# Patient Record
Sex: Female | Born: 1965
Health system: Southern US, Community
[De-identification: ages and names within clinical notes are randomized; demographics above are authoritative.]

## PROBLEM LIST (undated history)

## (undated) DIAGNOSIS — I2699 Other pulmonary embolism without acute cor pulmonale: Secondary | ICD-10-CM

## (undated) DIAGNOSIS — E119 Type 2 diabetes mellitus without complications: Secondary | ICD-10-CM

## (undated) DIAGNOSIS — Z973 Presence of spectacles and contact lenses: Secondary | ICD-10-CM

## (undated) DIAGNOSIS — M1990 Unspecified osteoarthritis, unspecified site: Secondary | ICD-10-CM

## (undated) DIAGNOSIS — L659 Nonscarring hair loss, unspecified: Secondary | ICD-10-CM

## (undated) DIAGNOSIS — I10 Essential (primary) hypertension: Secondary | ICD-10-CM

## (undated) DIAGNOSIS — E538 Deficiency of other specified B group vitamins: Secondary | ICD-10-CM

## (undated) DIAGNOSIS — J069 Acute upper respiratory infection, unspecified: Secondary | ICD-10-CM

## (undated) DIAGNOSIS — D509 Iron deficiency anemia, unspecified: Secondary | ICD-10-CM

## (undated) HISTORY — DX: Type 2 diabetes mellitus without complications: E11.9

## (undated) HISTORY — DX: Essential (primary) hypertension: I10

## (undated) HISTORY — DX: Nonscarring hair loss, unspecified: L65.9

## (undated) HISTORY — DX: Deficiency of other specified B group vitamins: E53.8

## (undated) HISTORY — DX: Other pulmonary embolism without acute cor pulmonale: I26.99

## (undated) HISTORY — DX: Iron deficiency anemia, unspecified: D50.9

---

## 2002-12-10 ENCOUNTER — Other Ambulatory Visit: Admission: RE | Admit: 2002-12-10 | Discharge: 2002-12-10 | Payer: Self-pay | Admitting: *Deleted

## 2008-10-24 ENCOUNTER — Emergency Department (HOSPITAL_COMMUNITY): Admission: EM | Admit: 2008-10-24 | Discharge: 2008-10-25 | Payer: Self-pay | Admitting: Emergency Medicine

## 2009-01-14 ENCOUNTER — Encounter: Admission: RE | Admit: 2009-01-14 | Discharge: 2009-01-14 | Payer: Self-pay | Admitting: Internal Medicine

## 2010-02-12 ENCOUNTER — Encounter
Admission: RE | Admit: 2010-02-12 | Discharge: 2010-02-12 | Payer: Self-pay | Source: Home / Self Care | Attending: Internal Medicine | Admitting: Internal Medicine

## 2011-03-09 ENCOUNTER — Other Ambulatory Visit: Payer: Self-pay | Admitting: Internal Medicine

## 2011-03-09 DIAGNOSIS — Z1231 Encounter for screening mammogram for malignant neoplasm of breast: Secondary | ICD-10-CM

## 2011-04-06 ENCOUNTER — Ambulatory Visit
Admission: RE | Admit: 2011-04-06 | Discharge: 2011-04-06 | Disposition: A | Discharge status: Home / Self Care | Payer: 59 | Source: Ambulatory Visit | Attending: Internal Medicine | Admitting: Internal Medicine

## 2011-04-06 DIAGNOSIS — Z1231 Encounter for screening mammogram for malignant neoplasm of breast: Secondary | ICD-10-CM

## 2012-08-09 ENCOUNTER — Other Ambulatory Visit: Payer: Self-pay

## 2012-08-09 DIAGNOSIS — Z1231 Encounter for screening mammogram for malignant neoplasm of breast: Secondary | ICD-10-CM

## 2012-08-27 ENCOUNTER — Ambulatory Visit
Admission: RE | Admit: 2012-08-27 | Discharge: 2012-08-27 | Disposition: A | Discharge status: Home / Self Care | Payer: 59 | Source: Ambulatory Visit

## 2012-08-27 DIAGNOSIS — Z1231 Encounter for screening mammogram for malignant neoplasm of breast: Secondary | ICD-10-CM

## 2013-02-14 HISTORY — PX: BREAST BIOPSY: SHX20

## 2013-11-29 ENCOUNTER — Other Ambulatory Visit: Payer: Self-pay

## 2013-11-29 DIAGNOSIS — Z1239 Encounter for other screening for malignant neoplasm of breast: Secondary | ICD-10-CM

## 2013-12-05 ENCOUNTER — Other Ambulatory Visit: Payer: Self-pay

## 2013-12-05 DIAGNOSIS — Z1231 Encounter for screening mammogram for malignant neoplasm of breast: Secondary | ICD-10-CM

## 2013-12-11 ENCOUNTER — Ambulatory Visit
Admission: RE | Admit: 2013-12-11 | Discharge: 2013-12-11 | Disposition: A | Discharge status: Home / Self Care | Payer: 59 | Source: Ambulatory Visit

## 2013-12-11 DIAGNOSIS — Z1231 Encounter for screening mammogram for malignant neoplasm of breast: Secondary | ICD-10-CM

## 2013-12-13 ENCOUNTER — Other Ambulatory Visit: Payer: Self-pay | Admitting: Internal Medicine

## 2013-12-13 DIAGNOSIS — R928 Other abnormal and inconclusive findings on diagnostic imaging of breast: Secondary | ICD-10-CM

## 2013-12-23 ENCOUNTER — Ambulatory Visit
Admission: RE | Admit: 2013-12-23 | Discharge: 2013-12-23 | Disposition: A | Discharge status: Home / Self Care | Payer: 59 | Source: Ambulatory Visit | Attending: Internal Medicine | Admitting: Internal Medicine

## 2013-12-23 ENCOUNTER — Other Ambulatory Visit: Payer: Self-pay | Admitting: Internal Medicine

## 2013-12-23 DIAGNOSIS — R928 Other abnormal and inconclusive findings on diagnostic imaging of breast: Secondary | ICD-10-CM

## 2013-12-30 ENCOUNTER — Other Ambulatory Visit: Payer: Self-pay | Admitting: Internal Medicine

## 2013-12-30 DIAGNOSIS — R928 Other abnormal and inconclusive findings on diagnostic imaging of breast: Secondary | ICD-10-CM

## 2013-12-31 ENCOUNTER — Ambulatory Visit
Admission: RE | Admit: 2013-12-31 | Discharge: 2013-12-31 | Disposition: A | Discharge status: Home / Self Care | Payer: 59 | Source: Ambulatory Visit | Attending: Internal Medicine | Admitting: Internal Medicine

## 2013-12-31 DIAGNOSIS — R928 Other abnormal and inconclusive findings on diagnostic imaging of breast: Secondary | ICD-10-CM

## 2015-01-23 ENCOUNTER — Other Ambulatory Visit: Payer: Self-pay

## 2015-01-23 DIAGNOSIS — Z1231 Encounter for screening mammogram for malignant neoplasm of breast: Secondary | ICD-10-CM

## 2015-02-12 ENCOUNTER — Ambulatory Visit
Admission: RE | Admit: 2015-02-12 | Discharge: 2015-02-12 | Disposition: A | Discharge status: Home / Self Care | Payer: 59 | Source: Ambulatory Visit

## 2015-02-12 DIAGNOSIS — Z1231 Encounter for screening mammogram for malignant neoplasm of breast: Secondary | ICD-10-CM

## 2016-01-28 ENCOUNTER — Encounter: Payer: Self-pay | Admitting: Podiatry

## 2016-01-28 ENCOUNTER — Ambulatory Visit (INDEPENDENT_AMBULATORY_CARE_PROVIDER_SITE_OTHER): Payer: 59 | Admitting: Podiatry

## 2016-01-28 ENCOUNTER — Ambulatory Visit (INDEPENDENT_AMBULATORY_CARE_PROVIDER_SITE_OTHER): Payer: 59

## 2016-01-28 VITALS — HR 100 | Resp 16 | Ht 65.0 in | Wt 330.0 lb

## 2016-01-28 DIAGNOSIS — M722 Plantar fascial fibromatosis: Secondary | ICD-10-CM

## 2016-01-28 DIAGNOSIS — M79672 Pain in left foot: Secondary | ICD-10-CM

## 2016-01-28 DIAGNOSIS — M79671 Pain in right foot: Secondary | ICD-10-CM | POA: Diagnosis not present

## 2016-01-28 MED ORDER — TRIAMCINOLONE ACETONIDE 10 MG/ML IJ SUSP
10.0000 mg | Freq: Once | INTRAMUSCULAR | Status: AC
  Administered 2016-01-28: 10 mg

## 2016-01-28 NOTE — Progress Notes (Signed)
Subjective:     Patient ID: Kristen [name], female   DOB: [birth year], 50 y.o.   MRN: [id number]  HPI patient states I've had a lot of pain in the bottom of both my heels for several months and I do not remember specific injury. States it's worse when she gets up in the morning and after periods of sitting   Review of Systems  All other systems reviewed and are negative.      Objective:   Physical Exam  Constitutional: She is oriented to person, place, and time.  Cardiovascular: Intact distal pulses.   Musculoskeletal: Normal range of motion.  Neurological: She is oriented to person, place, and time.  Skin: Skin is warm.  Nursing note and vitals reviewed.  neurovascular status intact muscle strength adequate range of motion within normal limits with patient found to have exquisite discomfort plantar heel region right over left with fluid buildup around the medial band. I noted there to be diminished arch height bilateral with obesity as, Katie factor     Assessment:     Acute plantar fasciitis right over left    Plan:     H&P x-rays reviewed and injected the plantar fascial bilateral 3 mg Kenalog 5 mill grams Xylocaine and applied fascial brace bilateral gave instructions on physical therapy. Reappoint to recheck in the next several weeks and told her to watch her sugar more carefully over the next several days  X-ray report indicates significant depression of the arch with spur formation and no indication of stress fracture arthritis

## 2016-01-28 NOTE — Progress Notes (Signed)
   Subjective:    Patient ID: Kristen [name], female    DOB: October 30, 1965, 50 y.o.   MRN: [id number]  HPI Chief Complaint  Patient presents with  . Foot Pain    Bilateral; plantar; pt stated, "Has bursitis; has more pain on the right foot; hurts to walk on feet"; x1 month   Pt Diabetes Type 2; Sugar=did not check today; A1C=6.4   Review of Systems  All other systems reviewed and are negative.      Objective:   Physical Exam        Assessment & Plan:

## 2016-01-28 NOTE — Patient Instructions (Signed)

## 2016-01-29 ENCOUNTER — Ambulatory Visit: Payer: Self-pay | Admitting: Podiatry

## 2016-02-02 ENCOUNTER — Encounter (HOSPITAL_COMMUNITY): Payer: Self-pay | Admitting: Family Medicine

## 2016-02-02 ENCOUNTER — Ambulatory Visit (HOSPITAL_COMMUNITY)
Admission: EM | Admit: 2016-02-02 | Discharge: 2016-02-02 | Disposition: A | Discharge status: Home / Self Care | Payer: 59 | Attending: Family Medicine | Admitting: Family Medicine

## 2016-02-02 DIAGNOSIS — R05 Cough: Secondary | ICD-10-CM | POA: Diagnosis not present

## 2016-02-02 DIAGNOSIS — R062 Wheezing: Secondary | ICD-10-CM

## 2016-02-02 DIAGNOSIS — J208 Acute bronchitis due to other specified organisms: Secondary | ICD-10-CM | POA: Diagnosis not present

## 2016-02-02 DIAGNOSIS — R059 Cough, unspecified: Secondary | ICD-10-CM

## 2016-02-02 MED ORDER — ALBUTEROL SULFATE HFA 108 (90 BASE) MCG/ACT IN AERS: 2.0000 | INHALATION_SPRAY | RESPIRATORY_TRACT | 0 refills | Status: DC | PRN

## 2016-02-02 MED ORDER — AZITHROMYCIN 250 MG PO TABS: 250.0000 mg | ORAL_TABLET | Freq: Every day | ORAL | 0 refills | Status: DC

## 2016-02-02 MED ORDER — METHYLPREDNISOLONE 4 MG PO TBPK: ORAL_TABLET | ORAL | 0 refills | Status: DC

## 2016-02-02 MED ORDER — BENZONATATE 100 MG PO CAPS: 200.0000 mg | ORAL_CAPSULE | Freq: Three times a day (TID) | ORAL | 0 refills | Status: DC | PRN

## 2016-02-02 NOTE — ED Triage Notes (Signed)
Pt here for cough, worse at night and throat irritation. Pt hoarse. sts she has been using OTC meds without relief.

## 2016-02-02 NOTE — ED Provider Notes (Signed)
CSN: [id number]     Arrival date & time 02/02/16  1901 History   First MD Initiated Contact with Patient 02/02/16 1956     Chief Complaint  Patient presents with  . Cough   (Consider location/radiation/quality/duration/timing/severity/associated sxs/prior Treatment) 50 y/o female c/o wheezing and cough which is worse at night.    Cough  Cough characteristics:  Productive Sputum characteristics:  Yellow Severity:  Moderate Onset quality:  Sudden Duration:  1 week Timing:  Constant Progression:  Worsening Chronicity:  New Smoker: no   Context: upper respiratory infection and weather changes   Relieved by:  Nothing Worsened by:  Deep breathing and activity Ineffective treatments:  None tried Associated symptoms: rhinorrhea, shortness of breath, sinus congestion and wheezing     Past Medical History:  Diagnosis Date  . Diabetes ([place])    Type 2  . Hypertension    History reviewed. No pertinent surgical history. History reviewed. No pertinent family history. Social History  Substance Use Topics  . Smoking status: Never Smoker  . Smokeless tobacco: Never Used  . Alcohol use Not on file   OB History    No data available     Review of Systems  Constitutional: Positive for fatigue.  HENT: Positive for rhinorrhea.   Eyes: Negative.   Respiratory: Positive for cough, shortness of breath and wheezing.   Cardiovascular: Negative.   Gastrointestinal: Negative.   Endocrine: Negative.   Genitourinary: Negative.   Musculoskeletal: Negative.   Neurological: Negative.   Hematological: Negative.   Psychiatric/Behavioral: Negative.     Allergies  Amoxicillin  Home Medications   Prior to Admission medications   Medication Sig Start Date End Date Taking? Authorizing Provider  albuterol (PROVENTIL HFA;VENTOLIN HFA) 108 (90 Base) MCG/ACT inhaler Inhale 2 puffs into the lungs every 4 (four) hours as needed for wheezing or shortness of breath. 02/02/16   Lysbeth Penner,  FNP  aspirin EC 81 MG tablet Take 81 mg by mouth daily.    Historical Provider, MD  azithromycin (ZITHROMAX) 250 MG tablet Take 1 tablet (250 mg total) by mouth daily. Take first 2 tablets together, then 1 every day until finished. 02/02/16   Lysbeth Penner, FNP  benzonatate (TESSALON) 100 MG capsule Take 2 capsules (200 mg total) by mouth 3 (three) times daily as needed for cough. 02/02/16   Lysbeth Penner, FNP  cetirizine (ZYRTEC) 10 MG tablet Take 10 mg by mouth daily.    Historical Provider, MD  liraglutide (VICTOZA) 18 MG/3ML SOPN Inject into the skin.    Historical Provider, MD  metFORMIN (GLUCOPHAGE) 500 MG tablet Take 500 mg by mouth 2 (two) times daily with a meal.    Historical Provider, MD  methylPREDNISolone (MEDROL DOSEPAK) 4 MG TBPK tablet Take 6-5-4-3-2-1 po qd 02/02/16   Lysbeth Penner, FNP  Misc Natural Products (OSTEO BI-FLEX TRIPLE STRENGTH PO) Take 2 tablets by mouth daily.    Historical Provider, MD  Multiple Vitamins-Minerals (MULTIVITAMIN ADULT PO) Take 1 tablet by mouth daily.    Historical Provider, MD  olmesartan-hydrochlorothiazide (BENICAR HCT) 20-12.5 MG tablet Take 1 tablet by mouth daily.    Historical Provider, MD  vitamin B-12 (CYANOCOBALAMIN) 100 MCG tablet Take 100 mcg by mouth daily. Pt takes 2 tablets every day    Historical Provider, MD   Meds Ordered and Administered this Visit  Medications - No data to display  BP 105/76   Pulse 93   Temp 98.2 F (36.8 C)   Resp 18  SpO2 93%  No data found.   Physical Exam  Constitutional: She appears well-developed and well-nourished.  HENT:  Head: Normocephalic.  Right Ear: External ear normal.  Left Ear: External ear normal.  Mouth/Throat: Oropharynx is clear and moist.  Eyes: Conjunctivae and EOM are normal. Pupils are equal, round, and reactive to light.  Neck: Normal range of motion. Neck supple.  Cardiovascular: Normal rate, regular rhythm and normal heart sounds.   Pulmonary/Chest: Effort  normal. She has wheezes.  Abdominal: Soft. Bowel sounds are normal.  Musculoskeletal: Normal range of motion.  Nursing note and vitals reviewed.   Urgent Care Course   Clinical Course     Procedures (including critical care time)  Labs Review Labs Reviewed - No data to display  Imaging Review No results found.   Visual Acuity Review  Right Eye Distance:   Left Eye Distance:   Bilateral Distance:    Right Eye Near:   Left Eye Near:    Bilateral Near:         MDM   1. Acute bronchitis due to other specified organisms   2. Cough   3. Wheezing    Medrol dose pack as directed #21 4mg  Zithromax Pack as directed Albuterol MDI 2 puffs qid prn #1 inhaler Tessalon Perles 100mg  2 po tid prn #30  Push po fluids, rest, tylenol and motrin otc prn as directed for fever, arthralgias, and myalgias.  Follow up prn if sx's continue or persist.   Lysbeth Penner, FNP 02/02/16 2014

## 2016-02-10 ENCOUNTER — Ambulatory Visit (INDEPENDENT_AMBULATORY_CARE_PROVIDER_SITE_OTHER): Payer: 59 | Admitting: Podiatry

## 2016-02-10 ENCOUNTER — Encounter: Payer: Self-pay | Admitting: Podiatry

## 2016-02-10 ENCOUNTER — Other Ambulatory Visit: Payer: Self-pay | Admitting: Internal Medicine

## 2016-02-10 DIAGNOSIS — M722 Plantar fascial fibromatosis: Secondary | ICD-10-CM

## 2016-02-10 DIAGNOSIS — Z1231 Encounter for screening mammogram for malignant neoplasm of breast: Secondary | ICD-10-CM

## 2016-02-10 NOTE — Progress Notes (Signed)
Subjective:     Patient ID: Kristen [name], female   DOB: [birth year], 50 y.o.   MRN: [id number]  HPI patient states that she still having pain but it's improved from previous visit with inflammation still noted upon   Review of Systems     Objective:   Physical Exam Neurovascular status intact muscle strength was adequate patient found to have continued discomfort plantar heel region bilateral with improvement from previous visit but still present upon deep    Assessment:     Plantar fasciitis still present but improved    Plan:     Advised patient on long-term orthotics and discussed and went ahead today and scanned for customized orthotic devices. I then discussed physical therapy and reviewed all different components of this condition

## 2016-02-16 ENCOUNTER — Ambulatory Visit
Admission: RE | Admit: 2016-02-16 | Discharge: 2016-02-16 | Disposition: A | Discharge status: Home / Self Care | Payer: 59 | Source: Ambulatory Visit | Attending: Internal Medicine | Admitting: Internal Medicine

## 2016-02-16 DIAGNOSIS — Z1231 Encounter for screening mammogram for malignant neoplasm of breast: Secondary | ICD-10-CM

## 2016-02-24 ENCOUNTER — Other Ambulatory Visit: Payer: Self-pay | Admitting: Gastroenterology

## 2016-03-17 NOTE — H&P (Signed)
  Kristen [name] HPI: At this time the patient denies any problems with nausea, vomiting, fevers, chills, abdominal pain, diarrhea, constipation, hematochezia, melena, GERD, or dysphagia. The patient denies any known family history of colon cancers. No complaints of chest pain, SOB, MI, or sleep apnea.  Past Medical History:  Diagnosis Date  . Diabetes ([place])    Type 2  . Hypertension     No past surgical history on file.  No family history on file.  Social History:  reports that she has never smoked. She has never used smokeless tobacco. Her alcohol and drug histories are not on file.  Allergies:  Allergies  Allergen Reactions  . Amoxicillin Hives    Has patient had a PCN reaction causing immediate rash, facial/tongue/throat swelling, SOB or lightheadedness with hypotension: No Has patient had a PCN reaction causing severe rash involving mucus membranes or skin necrosis: Yes Has patient had a PCN reaction that required hospitalization No Has patient had a PCN reaction occurring within the last 10 years: No If all of the above answers are "NO", then may proceed with Cephalosporin use.   . Fish Allergy Hives    Medications: Scheduled: Continuous:  No results found for this or any previous visit (from the past 24 hour(s)).   No results found.  ROS:  As stated above in the HPI otherwise negative.  There were no vitals taken for this visit.    PE: Gen: NAD, Alert and Oriented HEENT:  [place]/AT, EOMI Neck: Supple, no LAD Lungs: CTA Bilaterally CV: RRR without M/G/R ABM: Soft, NTND, +BS Ext: No C/C/E  Assessment/Plan: 1) Screening colonoscopy.  Dustin Bumbaugh D 03/17/2016, 11:30 AM

## 2016-03-18 ENCOUNTER — Encounter (HOSPITAL_COMMUNITY): Payer: Self-pay

## 2016-03-18 ENCOUNTER — Ambulatory Visit (HOSPITAL_COMMUNITY): Payer: BLUE CROSS/BLUE SHIELD | Admitting: Anesthesiology

## 2016-03-18 ENCOUNTER — Ambulatory Visit (HOSPITAL_COMMUNITY)
Admission: RE | Admit: 2016-03-18 | Discharge: 2016-03-18 | Disposition: A | Discharge status: Home / Self Care | Payer: BLUE CROSS/BLUE SHIELD | Source: Ambulatory Visit | Attending: Gastroenterology | Admitting: Gastroenterology

## 2016-03-18 ENCOUNTER — Encounter (HOSPITAL_COMMUNITY)
Admission: RE | Disposition: A | Discharge status: Home / Self Care | Payer: Self-pay | Source: Ambulatory Visit | Attending: Gastroenterology

## 2016-03-18 DIAGNOSIS — E119 Type 2 diabetes mellitus without complications: Secondary | ICD-10-CM | POA: Diagnosis not present

## 2016-03-18 DIAGNOSIS — Z7984 Long term (current) use of oral hypoglycemic drugs: Secondary | ICD-10-CM | POA: Diagnosis not present

## 2016-03-18 DIAGNOSIS — K5730 Diverticulosis of large intestine without perforation or abscess without bleeding: Secondary | ICD-10-CM | POA: Insufficient documentation

## 2016-03-18 DIAGNOSIS — I10 Essential (primary) hypertension: Secondary | ICD-10-CM | POA: Diagnosis not present

## 2016-03-18 DIAGNOSIS — Z880 Allergy status to penicillin: Secondary | ICD-10-CM | POA: Insufficient documentation

## 2016-03-18 DIAGNOSIS — Z79899 Other long term (current) drug therapy: Secondary | ICD-10-CM | POA: Diagnosis not present

## 2016-03-18 DIAGNOSIS — Z91013 Allergy to seafood: Secondary | ICD-10-CM | POA: Insufficient documentation

## 2016-03-18 DIAGNOSIS — Z1211 Encounter for screening for malignant neoplasm of colon: Secondary | ICD-10-CM | POA: Diagnosis present

## 2016-03-18 HISTORY — PX: COLONOSCOPY WITH PROPOFOL: SHX5780

## 2016-03-18 LAB — GLUCOSE, CAPILLARY: Glucose-Capillary: 135 mg/dL — ABNORMAL HIGH (ref 65–99)

## 2016-03-18 SURGERY — COLONOSCOPY WITH PROPOFOL
Anesthesia: Monitor Anesthesia Care

## 2016-03-18 MED ORDER — OXYCODONE HCL 5 MG PO TABS: 5.0000 mg | ORAL_TABLET | Freq: Once | ORAL | Status: DC | PRN

## 2016-03-18 MED ORDER — OXYCODONE HCL 5 MG/5ML PO SOLN: 5.0000 mg | Freq: Once | ORAL | Status: DC | PRN

## 2016-03-18 MED ORDER — ONDANSETRON HCL 4 MG/2ML IJ SOLN: 4.0000 mg | Freq: Once | INTRAMUSCULAR | Status: DC | PRN

## 2016-03-18 MED ORDER — PROPOFOL 10 MG/ML IV BOLUS
INTRAVENOUS | Status: AC
  Filled 2016-03-18: qty 40

## 2016-03-18 MED ORDER — LACTATED RINGERS IV SOLN
INTRAVENOUS | Status: DC
  Administered 2016-03-18: 07:00:00 via INTRAVENOUS

## 2016-03-18 MED ORDER — FENTANYL CITRATE (PF) 100 MCG/2ML IJ SOLN: 25.0000 ug | INTRAMUSCULAR | Status: DC | PRN

## 2016-03-18 MED ORDER — PROPOFOL 10 MG/ML IV BOLUS
INTRAVENOUS | Status: DC | PRN
  Administered 2016-03-18 (×6): 20 mg via INTRAVENOUS
  Administered 2016-03-18: 30 mg via INTRAVENOUS
  Administered 2016-03-18 (×2): 20 mg via INTRAVENOUS
  Administered 2016-03-18: 30 mg via INTRAVENOUS
  Administered 2016-03-18 (×2): 10 mg via INTRAVENOUS
  Administered 2016-03-18: 20 mg via INTRAVENOUS
  Administered 2016-03-18: 30 mg via INTRAVENOUS
  Administered 2016-03-18 (×2): 20 mg via INTRAVENOUS
  Administered 2016-03-18: 30 mg via INTRAVENOUS

## 2016-03-18 MED ORDER — SODIUM CHLORIDE 0.9 % IV SOLN: INTRAVENOUS | Status: DC

## 2016-03-18 SURGICAL SUPPLY — 21 items

## 2016-03-18 NOTE — Anesthesia Postprocedure Evaluation (Signed)
Anesthesia Post Note  Patient: Kristen [name]  Procedure(s) Performed: Procedure(s) (LRB): COLONOSCOPY WITH PROPOFOL (N/A)  Patient location during evaluation: Endoscopy Anesthesia Type: MAC Level of consciousness: awake, awake and alert and oriented Pain management: pain level controlled Vital Signs Assessment: post-procedure vital signs reviewed and stable Respiratory status: spontaneous breathing, nonlabored ventilation, respiratory function stable and patient connected to nasal cannula oxygen Cardiovascular status: blood pressure returned to baseline Anesthetic complications: no       Last Vitals:  Vitals:   03/18/16 0850 03/18/16 0900  BP: (!) 187/137 (!) 190/115  Pulse: 94 90  Resp: 19 15  Temp:      Last Pain:  Vitals:   03/18/16 0835  TempSrc: Oral                 Shaili Donalson COKER

## 2016-03-18 NOTE — Anesthesia Preprocedure Evaluation (Signed)
Anesthesia Evaluation  Patient identified by MRN, date of birth, ID band Patient awake    Reviewed: Allergy & Precautions, NPO status , Patient's Chart, lab work & pertinent test results  Airway Mallampati: II  TM Distance: >3 FB Neck ROM: Full    Dental  (+) Teeth Intact, Dental Advisory Given   Pulmonary    breath sounds clear to auscultation       Cardiovascular hypertension,  Rhythm:Regular Rate:Normal     Neuro/Psych    GI/Hepatic   Endo/Other  diabetes  Renal/GU      Musculoskeletal   Abdominal   Peds  Hematology   Anesthesia Other Findings   Reproductive/Obstetrics                             Anesthesia Physical Anesthesia Plan  ASA: III  Anesthesia Plan: MAC   Post-op Pain Management:    Induction: Intravenous  Airway Management Planned: Natural Airway and Nasal Cannula  Additional Equipment:   Intra-op Plan:   Post-operative Plan:   Informed Consent: I have reviewed the patients History and Physical, chart, labs and discussed the procedure including the risks, benefits and alternatives for the proposed anesthesia with the patient or authorized representative who has indicated his/her understanding and acceptance.   Dental advisory given  Plan Discussed with: CRNA and Anesthesiologist  Anesthesia Plan Comments:         Anesthesia Quick Evaluation

## 2016-03-18 NOTE — Discharge Instructions (Signed)

## 2016-03-18 NOTE — Op Note (Signed)
[facility] Patient Name: Kristen [name] Procedure Date: 03/18/2016 MRN: [id number] Attending MD: Carol Ada , MD Date of Birth: 11-Aug-1965 CSN: [id number] Age: 51 Admit Type: Outpatient Procedure:                Colonoscopy Indications:              Screening for colorectal malignant neoplasm Providers:                Carol Ada, MD, Hilma Favors, RN, Alfonso Patten,                            Technician, [place] Alday CRNA, CRNA Referring MD:              Medicines:                Propofol per Anesthesia Complications:            No immediate complications. Estimated Blood Loss:     Estimated blood loss: none. Procedure:                Pre-Anesthesia Assessment:                           - Prior to the procedure, a History and Physical                            was performed, and patient medications and                            allergies were reviewed. The patient's tolerance of                            previous anesthesia was also reviewed. The risks                            and benefits of the procedure and the sedation                            options and risks were discussed with the patient.                            All questions were answered, and informed consent                            was obtained. Prior Anticoagulants: The patient has                            taken no previous anticoagulant or antiplatelet                            agents. ASA Grade Assessment: III - A patient with                            severe systemic disease. After reviewing the risks  and benefits, the patient was deemed in                            satisfactory condition to undergo the procedure.                           - Sedation was administered by an anesthesia                            professional. Deep sedation was attained.                           After obtaining informed consent, the colonoscope      was passed under direct vision. Throughout the                            procedure, the patient's blood pressure, pulse, and                            oxygen saturations were monitored continuously. The                            EC-[id number]) scope was introduced through                            the anus and advanced to the the cecum, identified                            by appendiceal orifice and ileocecal valve. The                            colonoscopy was performed without difficulty. The                            patient tolerated the procedure well. The quality                            of the bowel preparation was excellent. The                            ileocecal valve, appendiceal orifice, and rectum                            were photographed. Scope In: 8:12:58 AM Scope Out: 8:31:12 AM Scope Withdrawal Time: 0 hours 14 minutes 59 seconds  Total Procedure Duration: 0 hours 18 minutes 14 seconds  Findings:      A few large-mouthed diverticula were found in the ascending colon. Impression:               - Diverticulosis in the ascending colon.                           - No specimens collected. Moderate Sedation:      None Recommendation:           - Patient has a  contact number available for                            emergencies. The signs and symptoms of potential                            delayed complications were discussed with the                            patient. Return to normal activities tomorrow.                            Written discharge instructions were provided to the                            patient.                           - Resume previous diet.                           - Continue present medications.                           - Repeat colonoscopy in 10 years for surveillance. Procedure Code(s):        --- Professional ---                           [id number], Colonoscopy, flexible; diagnostic, including                             collection of specimen(s) by brushing or washing,                            when performed (separate procedure) Diagnosis Code(s):        --- Professional ---                           Z12.11, Encounter for screening for malignant                            neoplasm of colon                           K57.30, Diverticulosis of large intestine without                            perforation or abscess without bleeding CPT copyright 2016 [employer] Association. All rights reserved. The codes documented in this report are preliminary and upon coder review may  be revised to meet current compliance requirements. Carol Ada, MD Carol Ada, MD 03/18/2016 8:39:01 AM This report has been signed electronically. Number of Addenda: 0

## 2016-03-18 NOTE — Transfer of Care (Signed)
Immediate Anesthesia Transfer of Care Note  Patient: Kristen [name]  Procedure(s) Performed: Procedure(s): COLONOSCOPY WITH PROPOFOL (N/A)  Patient Location: PACU  Anesthesia Type:MAC  Level of Consciousness: awake, alert  and oriented  Airway & Oxygen Therapy: Patient Spontanous Breathing and Patient connected to face mask oxygen  Post-op Assessment: Report given to RN and Post -op Vital signs reviewed and stable  Post vital signs: Reviewed and stable  Last Vitals:  Vitals:   03/18/16 0712  BP: (!) 195/116  Pulse: (!) 101  Resp: (!) 21  Temp: 36.6 C    Last Pain:  Vitals:   03/18/16 0712  TempSrc: Oral         Complications: No apparent anesthesia complications

## 2016-03-20 ENCOUNTER — Encounter (HOSPITAL_COMMUNITY): Payer: Self-pay | Admitting: Gastroenterology

## 2016-03-22 ENCOUNTER — Ambulatory Visit: Payer: BLUE CROSS/BLUE SHIELD

## 2016-03-22 DIAGNOSIS — M722 Plantar fascial fibromatosis: Secondary | ICD-10-CM

## 2016-03-22 NOTE — Patient Instructions (Signed)

## 2016-03-22 NOTE — Progress Notes (Signed)
Patient presents for orthotic pick up.  Verbal and written break in and wear instructions given.  Patient will follow up in 4 weeks if symptoms worsen or fail to improve. 

## 2016-07-02 ENCOUNTER — Encounter (HOSPITAL_COMMUNITY): Payer: Self-pay | Admitting: Emergency Medicine

## 2016-07-02 ENCOUNTER — Ambulatory Visit (HOSPITAL_COMMUNITY)
Admission: EM | Admit: 2016-07-02 | Discharge: 2016-07-02 | Disposition: A | Discharge status: Home / Self Care | Payer: BLUE CROSS/BLUE SHIELD | Attending: Internal Medicine | Admitting: Internal Medicine

## 2016-07-02 DIAGNOSIS — H66001 Acute suppurative otitis media without spontaneous rupture of ear drum, right ear: Secondary | ICD-10-CM | POA: Diagnosis not present

## 2016-07-02 MED ORDER — BENZONATATE 100 MG PO CAPS: 100.0000 mg | ORAL_CAPSULE | Freq: Three times a day (TID) | ORAL | 0 refills | Status: DC

## 2016-07-02 MED ORDER — CEFDINIR 300 MG PO CAPS: 300.0000 mg | ORAL_CAPSULE | Freq: Two times a day (BID) | ORAL | 0 refills | Status: DC

## 2016-07-02 NOTE — ED Provider Notes (Signed)
CSN: [id number]     Arrival date & time 07/02/16  1745 History   First MD Initiated Contact with Patient 07/02/16 1815     Chief Complaint  Patient presents with  . URI   (Consider location/radiation/quality/duration/timing/severity/associated sxs/prior Treatment) 51 year old female presents to clinic with an 8 day history of cough, congestion, initially had a runny nose, and since resolved, fever, chills, loss of appetite, 2 day history of hoarse voice, and a 24-hour history of right ear pain and pressure. She denies any nausea, vomiting, diarrhea, shortness of breath, wheezing, or other symptoms. She has taken over-the-counter naproxen with some relief, otherwise does not take any additional over-the-counter medicines.   The history is provided by the patient.  URI  Presenting symptoms: congestion, cough, ear pain, fever and sore throat   Presenting symptoms: no rhinorrhea   Associated symptoms: no sinus pain and no wheezing     Past Medical History:  Diagnosis Date  . Diabetes ([place])    Type 2  . Hypertension    Past Surgical History:  Procedure Laterality Date  . COLONOSCOPY WITH PROPOFOL N/A 03/18/2016   Procedure: COLONOSCOPY WITH PROPOFOL;  Surgeon: Carol Ada, MD;  Location: [facility];  Service: Endoscopy;  Laterality: N/A;   History reviewed. No pertinent family history. Social History  Substance Use Topics  . Smoking status: Never Smoker  . Smokeless tobacco: Never Used  . Alcohol use No   OB History    No data available     Review of Systems  Constitutional: Positive for appetite change, chills and fever.  HENT: Positive for congestion, ear pain, sore throat and voice change. Negative for rhinorrhea, sinus pain, sinus pressure and trouble swallowing.   Eyes: Negative.   Respiratory: Positive for cough. Negative for shortness of breath and wheezing.   Cardiovascular: Negative for chest pain and palpitations.  Gastrointestinal: Negative for abdominal pain,  diarrhea, nausea and vomiting.  Musculoskeletal: Negative.   Skin: Negative.   Neurological: Negative.     Allergies  Amoxicillin and Fish allergy  Home Medications   Prior to Admission medications   Medication Sig Start Date End Date Taking? Authorizing Provider  aspirin EC 81 MG tablet Take 81 mg by mouth 3 (three) times a week. Mon, Wed, and Fri   Yes [provider]  cetirizine (ZYRTEC) 10 MG tablet Take 10 mg by mouth daily.   Yes [provider]  FLUoxetine (PROZAC) 20 MG tablet Take 20 mg by mouth daily. 02/14/16  Yes [provider]  metFORMIN (GLUCOPHAGE) 500 MG tablet Take 1,000 mg by mouth 2 (two) times daily with a meal.    Yes [provider]  Multiple Vitamins-Minerals (MULTIVITAMIN ADULT PO) Take 1 tablet by mouth daily.   Yes [provider]  olmesartan-hydrochlorothiazide (BENICAR HCT) 20-12.5 MG tablet Take 1 tablet by mouth daily.   Yes [provider]  vitamin B-12 (CYANOCOBALAMIN) 100 MCG tablet Take 200 mcg by mouth daily.    Yes [provider]  benzonatate (TESSALON) 100 MG capsule Take 1 capsule (100 mg total) by mouth every 8 (eight) hours. 07/02/16   Barnet Glasgow, NP  cefdinir (OMNICEF) 300 MG capsule Take 1 capsule (300 mg total) by mouth 2 (two) times daily. 07/02/16   Barnet Glasgow, NP  ibuprofen (ADVIL,MOTRIN) 200 MG tablet Take 600 mg by mouth daily as needed for moderate pain.    [provider]  liraglutide (VICTOZA) 18 MG/3ML SOPN Inject 1.8 mg into the skin every evening.  [provider]  Misc Natural Products (OSTEO BI-FLEX TRIPLE STRENGTH PO) Take 1 tablet by mouth daily.     [provider]   Meds Ordered and Administered this Visit  Medications - No data to display  BP (!) 145/99 (BP Location: Right Wrist)   Pulse (!) 123   Temp 99 F (37.2 C) (Oral)   Resp (!) 22   LMP 06/11/2016   SpO2 94%  No data found.   Physical Exam   Constitutional: She is oriented to person, place, and time. She appears well-developed and well-nourished.  HENT:  Head: Normocephalic.  Right Ear: External ear normal. Tympanic membrane is injected, erythematous and bulging.  Left Ear: Tympanic membrane and external ear normal.  Eyes: Conjunctivae are normal.  Neck: Normal range of motion. No JVD present.  Cardiovascular: Normal rate and regular rhythm.   Pulmonary/Chest: Effort normal and breath sounds normal.  Lymphadenopathy:    She has no cervical adenopathy.  Neurological: She is alert and oriented to person, place, and time.  Skin: Skin is warm and dry. Capillary refill takes less than 2 seconds.  Psychiatric: She has a normal mood and affect. Her behavior is normal.  Nursing note and vitals reviewed.   Urgent Care Course     Procedures (including critical care time)  Labs Review Labs Reviewed - No data to display  Imaging Review No results found.    MDM   1. Acute suppurative otitis media of right ear without spontaneous rupture of tympanic membrane, recurrence not specified    Acute otitis media, given Tessalon for cough cefdinir has an antibiotic, provided counseling on over-the-counter therapies for symptom relief. Follow-up with primary care in 1 week.    Barnet Glasgow, NP 07/02/16 1836

## 2016-07-02 NOTE — Discharge Instructions (Signed)
You have acute otitis media, prescribed an antibiotic called Ceftin ear, take one tablet twice a day for 7 days. For your cough, prescribed Tessalon, take one tablet every 8 hours as needed. I recommend an over-the-counter antihistamine such as Claritin, Allegra, Zyrtec for the anger of the allergy season daily. And Flonase 2 sprays each nostril daily. If your pain persist past one week follow up with your primary care provider or return to clinic as needed.

## 2016-07-02 NOTE — ED Triage Notes (Signed)
Pt c/o cold sx onset 4 days associated w/right ear pain, facial pressure, prod cough, nasal congestion/drainage  Denies fevers  Using her rescue inhaler w/no relief.   A&O x4... NAD... Ambulatory

## 2016-07-06 ENCOUNTER — Ambulatory Visit (INDEPENDENT_AMBULATORY_CARE_PROVIDER_SITE_OTHER): Payer: BLUE CROSS/BLUE SHIELD

## 2016-07-06 ENCOUNTER — Ambulatory Visit (INDEPENDENT_AMBULATORY_CARE_PROVIDER_SITE_OTHER): Payer: BLUE CROSS/BLUE SHIELD | Admitting: Orthopaedic Surgery

## 2016-07-06 DIAGNOSIS — M1611 Unilateral primary osteoarthritis, right hip: Secondary | ICD-10-CM | POA: Diagnosis not present

## 2016-07-06 DIAGNOSIS — M25561 Pain in right knee: Secondary | ICD-10-CM

## 2016-07-06 DIAGNOSIS — M25551 Pain in right hip: Secondary | ICD-10-CM

## 2016-07-06 DIAGNOSIS — M1711 Unilateral primary osteoarthritis, right knee: Secondary | ICD-10-CM | POA: Diagnosis not present

## 2016-07-06 MED ORDER — LIDOCAINE HCL 1 % IJ SOLN
3.0000 mL | INTRAMUSCULAR | Status: AC | PRN
  Administered 2016-07-06: 3 mL

## 2016-07-06 MED ORDER — METHYLPREDNISOLONE ACETATE 40 MG/ML IJ SUSP
40.0000 mg | INTRAMUSCULAR | Status: AC | PRN
  Administered 2016-07-06: 40 mg via INTRA_ARTICULAR

## 2016-07-06 NOTE — Progress Notes (Signed)
Office Visit Note   Patient: Kristen [name]           Date of Birth: 03-Nov-1965           MRN: [id number] Visit Date: 07/06/2016              Requested by: Glendale Chard, [address] [place], [place] [address] PCP: Glendale Chard, MD   Assessment & Plan: Visit Diagnoses:  1. Pain in right hip   2. Acute pain of right knee   3. Unilateral primary osteoarthritis, right knee   4. Unilateral primary osteoarthritis, right hip     Plan: She understands fully the no matter what weight loss as malleolus important thing for her to maintain her joints. I talked her about a steroid injection in her right knee and she agreed with this. Her right knee with Betadine and alcohol and provided a steroid injection her right knee without difficulty. She'll watch her blood glucose is well. We will set her up for intra-articular injection her right hip under direct fluoroscopy with hopefully Dr. Ernestina Patches sometime in the next week or so. I want her to try either glucosamine or Tumeric for her arthritic joints. I would like to see her back in 4 weeks to see how she doing overall. We talked about discussing joint placed in surgery at some point.  Follow-Up Instructions: Return in about 4 weeks (around 08/03/2016).   Orders:  Orders Placed This Encounter  Procedures  . Large Joint Injection/Arthrocentesis  . XR HIP UNILAT W OR W/O PELVIS 1V RIGHT  . XR Knee 1-2 Views Right   No orders of the defined types were placed in this encounter.     Procedures: Large Joint Inj Date/Time: 07/06/2016 10:37 AM Performed by: Mcarthur Rossetti Authorized by: Mcarthur Rossetti   Location:  Knee Site:  R knee Ultrasound Guidance: No   Fluoroscopic Guidance: No   Arthrogram: No   Medications:  3 mL lidocaine 1 %; 40 mg methylPREDNISolone acetate 40 MG/ML     Clinical Data: No additional findings.   Subjective: No chief complaint on file. Patient is a very pleasant  51 year old female who is morbidly obese diabetic but are good glucose control. She says her last hemoglobin A1c was 6. She comes in with chief complaint of right hip pain is been on all for 6 months now is been slowly worsening with pain in her groin. She says it hurts with pivoting activities and when she first gets up and stand. She says it catches at times. She's also has a chief complaint of right knee pain for about 4 months now. With no known injury. She says she wonders has from the hip. She can't walk as much due to her pain she can't ride a bike at the gym due to pain. She is 330 pounds weight loss but both her hip and knee have detrimentally affected her activities daily living, her quality of life, and her mobility. It is a daily pain is more with activities. It does wake her up at night as well. He doesn't take any anti-inflammatories on a regular basis and says again she has good blood glucose control.  HPI  Review of Systems She denies any headache, chest pain, shortness of breath, fever, chills, nausea, vomiting.  Objective: Vital Signs: LMP 06/11/2016   Physical Exam She is a very pleasant individual who is alert and oriented 3. She gets on the exam table easily. She is morbidly  obese. Ortho Exam Examination of her right knee shows medial joint line tenderness with a varus malalignment. There is mild patellofemoral crepitation. Her Lockman's exam is negative and her Murray's exam is negative. Her pain is mainly medial joint line related. Her range of motion is full. Examination of her right hip shows severe pain with internal/external rotation of the hip especially in the groin but no blocks to rotation. Her flexion-extension is normal. Specialty Comments:  No specialty comments available.  Imaging: Xr Hip Unilat W Or W/o Pelvis 1v Right  Result Date: 07/06/2016 AP pelvis and lateral of her right hip show severe end-stage arthritis of right hip. There is almost complete loss of  superior lateral joint space. There are sclerotic changes on the femoral head and acetabulum as well as para-articular osteophytes.  Xr Knee 1-2 Views Right  Result Date: 07/06/2016 An AP and lateral of the right knee shows tricompartment arthritic changes. There is a varus malalignment. This involves mainly the medial compartment and the with sclerotic changes and para-articular osteophytes. There is also patellofemoral disease.    PMFS History: Patient Active Problem List   Diagnosis Date Noted  . Unilateral primary osteoarthritis, right knee 07/06/2016  . Unilateral primary osteoarthritis, right hip 07/06/2016  . Acute pain of right knee 07/06/2016  . Pain in right hip 07/06/2016   Past Medical History:  Diagnosis Date  . Diabetes ([place])    Type 2  . Hypertension     No family history on file.  Past Surgical History:  Procedure Laterality Date  . COLONOSCOPY WITH PROPOFOL N/A 03/18/2016   Procedure: COLONOSCOPY WITH PROPOFOL;  Surgeon: Carol Ada, MD;  Location: [facility];  Service: Endoscopy;  Laterality: N/A;   Social History   Occupational History  . Not on file.   Social History Main Topics  . Smoking status: Never Smoker  . Smokeless tobacco: Never Used  . Alcohol use No  . Drug use: No  . Sexual activity: Not on file

## 2016-07-07 ENCOUNTER — Other Ambulatory Visit (INDEPENDENT_AMBULATORY_CARE_PROVIDER_SITE_OTHER): Payer: Self-pay

## 2016-07-07 DIAGNOSIS — M25551 Pain in right hip: Secondary | ICD-10-CM

## 2016-07-15 ENCOUNTER — Ambulatory Visit (INDEPENDENT_AMBULATORY_CARE_PROVIDER_SITE_OTHER): Payer: BLUE CROSS/BLUE SHIELD | Admitting: Physical Medicine and Rehabilitation

## 2016-07-15 ENCOUNTER — Encounter (INDEPENDENT_AMBULATORY_CARE_PROVIDER_SITE_OTHER): Payer: Self-pay | Admitting: Physical Medicine and Rehabilitation

## 2016-07-15 ENCOUNTER — Ambulatory Visit (INDEPENDENT_AMBULATORY_CARE_PROVIDER_SITE_OTHER): Payer: BLUE CROSS/BLUE SHIELD

## 2016-07-15 VITALS — BP 132/84

## 2016-07-15 DIAGNOSIS — M25551 Pain in right hip: Secondary | ICD-10-CM

## 2016-07-15 NOTE — Patient Instructions (Signed)

## 2016-07-15 NOTE — Progress Notes (Signed)
Kristen [name] - 51 y.o. female MRN [id number]  Date of birth: [birth year]  Office Visit Note: Visit Date: 07/15/2016 PCP: Glendale Chard, MD Referred by: Glendale Chard, MD  Subjective: Chief Complaint  Patient presents with  . Right Hip - Pain   HPI: Kristen [name] is a 51 year old female with right hip and groin pain for 6 months. She's had no specific trauma. Pain is worse with movement she is better at rest and sitting. Dr. Ninfa Linden request diagnostic and therapeutic anesthetic hip arthrogram.    ROS Otherwise per HPI.  Assessment & Plan: Visit Diagnoses:  1. Pain in right hip     Plan: Findings:  Diagnostic and therapeutic anesthetic hip arthrogram on the right. Patient did have relief during the anesthetic phase of the injection. There was some medial joint extravasation of contrast.    Meds & Orders: No orders of the defined types were placed in this encounter.   Orders Placed This Encounter  Procedures  . Large Joint Injection/Arthrocentesis  . XR C-ARM NO REPORT    Follow-up: No Follow-up on file.   Procedures: Large Joint Inj Date/Time: 07/15/2016 9:03 AM Performed by: Magnus Sinning Authorized by: Magnus Sinning   Consent Given by:  Patient Site marked: the procedure site was marked   Timeout: prior to procedure the correct patient, procedure, and site was verified   Indications:  Pain and diagnostic evaluation Location:  Hip Site:  R hip joint Prep: patient was prepped and draped in usual sterile fashion   Needle Size:  22 G Approach:  Anterior Ultrasound Guidance: No   Fluoroscopic Guidance: No   Arthrogram: Yes   Medications:  3 mL bupivacaine 0.5 %; 80 mg triamcinolone acetonide 40 MG/ML Aspiration Attempted: Yes   Patient tolerance:  Patient tolerated the procedure well with no immediate complications  Arthrogram demonstrated excellent flow of contrast throughout the joint surface with some medial extravasation.  The patient had relief of  symptoms during the anesthetic phase of the injection.      No notes on file   Clinical History: No specialty comments available.  She reports that she has never smoked. She has never used smokeless tobacco. No results for input(s): HGBA1C, LABURIC in the last 8760 hours.  Objective:  VS:  HT:    WT:   BMI:     BP:132/84  HR: bpm  TEMP: ( )  RESP:  Physical Exam  Musculoskeletal:  Increased pain with internal rotation of the right hip. Patient is slow to rise from a seated position.    Ortho Exam Imaging: No results found.  Past Medical/Family/Surgical/Social History: Medications & Allergies reviewed per EMR Patient Active Problem List   Diagnosis Date Noted  . Unilateral primary osteoarthritis, right knee 07/06/2016  . Unilateral primary osteoarthritis, right hip 07/06/2016  . Acute pain of right knee 07/06/2016  . Pain in right hip 07/06/2016   Past Medical History:  Diagnosis Date  . Diabetes ([place])    Type 2  . Hypertension    No family history on file. Past Surgical History:  Procedure Laterality Date  . COLONOSCOPY WITH PROPOFOL N/A 03/18/2016   Procedure: COLONOSCOPY WITH PROPOFOL;  Surgeon: Carol Ada, MD;  Location: [facility];  Service: Endoscopy;  Laterality: N/A;   Social History   Occupational History  . Not on file.   Social History Main Topics  . Smoking status: Never Smoker  . Smokeless tobacco: Never Used  . Alcohol use No  . Drug use: No  .  Sexual activity: Not on file

## 2016-07-15 NOTE — Progress Notes (Deleted)
Right hip and groin pain for around 6 months and worse recently. Pain with movement. Ok with sitting.

## 2016-07-18 MED ORDER — BUPIVACAINE HCL 0.5 % IJ SOLN
3.0000 mL | INTRAMUSCULAR | Status: AC | PRN
  Administered 2016-07-15: 3 mL via INTRA_ARTICULAR

## 2016-07-18 MED ORDER — TRIAMCINOLONE ACETONIDE 40 MG/ML IJ SUSP
80.0000 mg | INTRAMUSCULAR | Status: AC | PRN
  Administered 2016-07-15: 80 mg via INTRA_ARTICULAR

## 2016-08-08 ENCOUNTER — Ambulatory Visit (INDEPENDENT_AMBULATORY_CARE_PROVIDER_SITE_OTHER): Payer: BLUE CROSS/BLUE SHIELD | Admitting: Orthopaedic Surgery

## 2016-11-20 ENCOUNTER — Ambulatory Visit (HOSPITAL_COMMUNITY)
Admission: EM | Admit: 2016-11-20 | Discharge: 2016-11-20 | Disposition: A | Discharge status: Home / Self Care | Payer: BLUE CROSS/BLUE SHIELD | Attending: Urgent Care | Admitting: Urgent Care

## 2016-11-20 ENCOUNTER — Encounter (HOSPITAL_COMMUNITY): Payer: Self-pay | Admitting: *Deleted

## 2016-11-20 DIAGNOSIS — R05 Cough: Secondary | ICD-10-CM

## 2016-11-20 DIAGNOSIS — I10 Essential (primary) hypertension: Secondary | ICD-10-CM

## 2016-11-20 DIAGNOSIS — R059 Cough, unspecified: Secondary | ICD-10-CM

## 2016-11-20 DIAGNOSIS — J22 Unspecified acute lower respiratory infection: Secondary | ICD-10-CM

## 2016-11-20 DIAGNOSIS — R030 Elevated blood-pressure reading, without diagnosis of hypertension: Secondary | ICD-10-CM

## 2016-11-20 MED ORDER — BENZONATATE 100 MG PO CAPS: 100.0000 mg | ORAL_CAPSULE | Freq: Three times a day (TID) | ORAL | 0 refills | Status: DC | PRN

## 2016-11-20 MED ORDER — AZITHROMYCIN 250 MG PO TABS: ORAL_TABLET | ORAL | 0 refills | Status: DC

## 2016-11-20 MED ORDER — HYDROCODONE-HOMATROPINE 5-1.5 MG/5ML PO SYRP: 5.0000 mL | ORAL_SOLUTION | Freq: Every evening | ORAL | 0 refills | Status: DC | PRN

## 2016-11-20 MED ORDER — ALBUTEROL SULFATE HFA 108 (90 BASE) MCG/ACT IN AERS: 1.0000 | INHALATION_SPRAY | Freq: Four times a day (QID) | RESPIRATORY_TRACT | 0 refills | Status: DC | PRN

## 2016-11-20 NOTE — ED Notes (Signed)
Patient discharged by provider Jaynee Eagles, PA

## 2016-11-20 NOTE — ED Triage Notes (Signed)
Patient reports 5 day history of productive cough and nasal congestion-states now short winded. Denies fevers. Reports no relief with OTC. States she is currently taking zyrtec. Patient reports tight breathing with ambulation.

## 2016-11-20 NOTE — ED Provider Notes (Signed)
  MRN: [id number] DOB: 08/18/1965  Subjective:   Kristen [name] is a 51 y.o. female presenting for chief complaint of Cough and Nasal Congestion  Reports 5 day history of productive cough, chest tightness, nasal congestion, scratchy throat, wheezing. Has tried Tussin tablets with some relief. Denies fever, chest pain, n/v, abdominal pain, sinus pain, ear pain, throat pain, rashes. Denies smoking cigarettes. Denies using any contraception. Denies smoking cigarettes. Denies recent surgeries, hospitalizations. Denies history of asthma.   No current facility-administered medications for this encounter.   Current Outpatient Prescriptions:  .  aspirin EC 81 MG tablet, Take 81 mg by mouth 3 (three) times a week. Mon, Wed, and Fri, Disp: , Rfl:  .  benzonatate (TESSALON) 100 MG capsule, Take 1 capsule (100 mg total) by mouth every 8 (eight) hours., Disp: 21 capsule, Rfl: 0 .  cefdinir (OMNICEF) 300 MG capsule, Take 1 capsule (300 mg total) by mouth 2 (two) times daily., Disp: 14 capsule, Rfl: 0 .  cetirizine (ZYRTEC) 10 MG tablet, Take 10 mg by mouth daily., Disp: , Rfl:  .  FLUoxetine (PROZAC) 20 MG tablet, Take 20 mg by mouth daily., Disp: , Rfl:  .  ibuprofen (ADVIL,MOTRIN) 200 MG tablet, Take 600 mg by mouth daily as needed for moderate pain., Disp: , Rfl:  .  liraglutide (VICTOZA) 18 MG/3ML SOPN, Inject 1.8 mg into the skin every evening. , Disp: , Rfl:  .  metFORMIN (GLUCOPHAGE) 500 MG tablet, Take 1,000 mg by mouth 2 (two) times daily with a meal. , Disp: , Rfl:  .  Misc Natural Products (OSTEO BI-FLEX TRIPLE STRENGTH PO), Take 1 tablet by mouth daily. , Disp: , Rfl:  .  Multiple Vitamins-Minerals (MULTIVITAMIN ADULT PO), Take 1 tablet by mouth daily., Disp: , Rfl:  .  olmesartan-hydrochlorothiazide (BENICAR HCT) 20-12.5 MG tablet, Take 1 tablet by mouth daily., Disp: , Rfl:  .  vitamin B-12 (CYANOCOBALAMIN) 100 MCG tablet, Take 200 mcg by mouth daily. , Disp: , Rfl:    Kristen [name] is  allergic to amoxicillin and fish allergy.  Kristen [name]  has a past medical history of Diabetes ([place]) and Hypertension. Also  has a past surgical history that includes Colonoscopy with propofol (N/A, 03/18/2016).  Objective:   Vitals: BP (!) 177/10 (BP Location: Left Arm)   Pulse (!) 101   Temp 98.5 F (36.9 C) (Oral)   Resp 18   SpO2 94%   BP Readings from Last 3 Encounters:  11/20/16 (!) 177/10  07/15/16 132/84  07/02/16 (!) 145/99   Physical Exam  No results found for this or any previous visit (from the past 24 hour(s)).  Assessment and Plan :     Kristen Eagles, PA-C [facility]  11/20/2016  4:36 PM    Kristen Eagles, PA-C 11/20/16 1649

## 2016-12-14 ENCOUNTER — Other Ambulatory Visit: Payer: Self-pay | Admitting: Urgent Care

## 2017-02-02 ENCOUNTER — Other Ambulatory Visit: Payer: Self-pay | Admitting: Internal Medicine

## 2017-02-02 DIAGNOSIS — Z1231 Encounter for screening mammogram for malignant neoplasm of breast: Secondary | ICD-10-CM

## 2017-03-20 ENCOUNTER — Ambulatory Visit: Payer: BLUE CROSS/BLUE SHIELD

## 2017-03-22 ENCOUNTER — Ambulatory Visit
Admission: RE | Admit: 2017-03-22 | Discharge: 2017-03-22 | Disposition: A | Discharge status: Home / Self Care | Payer: BLUE CROSS/BLUE SHIELD | Source: Ambulatory Visit | Attending: Internal Medicine | Admitting: Internal Medicine

## 2017-03-22 DIAGNOSIS — Z1231 Encounter for screening mammogram for malignant neoplasm of breast: Secondary | ICD-10-CM

## 2017-03-22 LAB — HM MAMMOGRAPHY: HM Mammogram: NORMAL (ref 0–4)

## 2017-05-22 ENCOUNTER — Ambulatory Visit (INDEPENDENT_AMBULATORY_CARE_PROVIDER_SITE_OTHER): Payer: BLUE CROSS/BLUE SHIELD | Admitting: Orthopaedic Surgery

## 2017-05-22 ENCOUNTER — Encounter (INDEPENDENT_AMBULATORY_CARE_PROVIDER_SITE_OTHER): Payer: Self-pay | Admitting: Orthopaedic Surgery

## 2017-05-22 DIAGNOSIS — M1611 Unilateral primary osteoarthritis, right hip: Secondary | ICD-10-CM

## 2017-05-22 NOTE — Progress Notes (Signed)
Office Visit Note   Patient: Kristen [name]           Date of Birth: [birth year]           MRN: [id number] Visit Date: 05/22/2017              Requested by: Glendale Chard, [address] [place], [place] [address] PCP: Glendale Chard, MD   Assessment & Plan: Visit Diagnoses:  1. Unilateral primary osteoarthritis, right hip     Plan: I counseled her again about weight loss.  I am happy that her blood glucose is under good control.  After performing a physical exam and seeing what her interval would be for a total hip arthroplasty I feel more confident that we can safely put this in his appropriate position as possible.  We had a long and thorough discussion about surgery and about continued weight loss.  She is interested at some point bariatric surgery as well.  I gave her our surgery schedulers card and says she can call us when she decides to have this scheduled.  All questions concerns were answered and  Follow-Up Instructions: Return for 2 weeks post-op.   Orders:  No orders of the defined types were placed in this encounter.  No orders of the defined types were placed in this encounter.     Procedures: No procedures performed   Clinical Data: No additional findings.   Subjective: Chief Complaint  Patient presents with  . Right Hip - Pain  The patient is well-known to me.  She is 52 years old and has well-documented severe end-stage arthritis involving her right hip.  She is very active individual but does have a body mass index over 50 she weighs just over 300 pounds.  X-rays confirm end-stage arthritis of her right hip and she is tried intra-articular injection as well as worked on activity modification and weight loss.  She is a diabetic but has a hemoglobin A1c of only in the 6 range at this point.  Her pain is daily and it is detrimentally affected x-rays daily living, quality of life, and her mobility.  I have actually performed hip replacement  surgery on her husband before.  At this point she does wish to proceed with a right total hip arthroplasty sometime later this year.  HPI  Review of Systems She currently denies any headache, chest pain, shortness of breath, fever, chills, nausea, vomiting.  Objective: Vital Signs: There were no vitals taken for this visit.  Physical Exam She is alert and oriented x3 and in no acute distress.  She is ambulating with a cane. Ortho Exam Examination of her right hip shows severe pain on internal and external rotation.  I had her lay in a supine position to see what a surgical interval would be forgetting to her hip and I felt very comfortable of easily getting to her hip because I can mobilize her abdomen and there is no issues with her groin crease or abdominal folds.  There is not a significant amount of adipose tissue anteriorly. Specialty Comments:  No specialty comments available.  Imaging: No results found. X-rays were reviewed from last year shows severe end-stage arthritis of the right hip with complete loss of superior lateral joint space.  There is also significant particular osteophytes.  PMFS History: Patient Active Problem List   Diagnosis Date Noted  . Unilateral primary osteoarthritis, right knee 07/06/2016  . Unilateral primary osteoarthritis, right hip 07/06/2016  . Acute  pain of right knee 07/06/2016  . Pain in right hip 07/06/2016   Past Medical History:  Diagnosis Date  . Diabetes ([place])    Type 2  . Hypertension     History reviewed. No pertinent family history.  Past Surgical History:  Procedure Laterality Date  . COLONOSCOPY WITH PROPOFOL N/A 03/18/2016   Procedure: COLONOSCOPY WITH PROPOFOL;  Surgeon: Carol Ada, MD;  Location: [facility];  Service: Endoscopy;  Laterality: N/A;   Social History   Occupational History  . Not on file  Tobacco Use  . Smoking status: Never Smoker  . Smokeless tobacco: Never Used  Substance and Sexual Activity  .  Alcohol use: No  . Drug use: No  . Sexual activity: Not on file

## 2017-07-25 ENCOUNTER — Other Ambulatory Visit (INDEPENDENT_AMBULATORY_CARE_PROVIDER_SITE_OTHER): Payer: Self-pay

## 2017-07-25 ENCOUNTER — Other Ambulatory Visit (INDEPENDENT_AMBULATORY_CARE_PROVIDER_SITE_OTHER): Payer: Self-pay | Admitting: Physician Assistant

## 2017-07-28 NOTE — Patient Instructions (Addendum)
Kristen [name]  07/28/2017   Your procedure is scheduled on: 08-04-17    Report to [facility] Main  Entrance    Report to admitting at 11:00AM    Call this number if you have problems the morning of surgery [phone]     Remember: Do not eat food After Midnight. You may have clear liquids until 7:30am. Nothing by mouth after 7:30am      Take these medicines the morning of surgery with A SIP OF WATER: albuterol inhaler if needed (Please bring), cetirizine, fluoxetine                                 You may not have any metal on your body including hair pins and              piercings  Do not wear jewelry, make-up, lotions, powders or perfumes, deodorant             Do not wear nail polish.  Do not shave  48 hours prior to surgery.      Do not bring valuables to the hospital. [place].  Contacts, dentures or bridgework may not be worn into surgery.  Leave suitcase in the car. After surgery it may be brought to your room.               Please read over the following fact sheets you were given: _____________________________________________________________________    CLEAR LIQUID DIET   Foods Allowed                                                                     Foods Excluded  Coffee and tea, regular and decaf                             liquids that you cannot  Plain Jell-O in any flavor                                             see through such as: Fruit ices (not with fruit pulp)                                     milk, soups, orange juice  Iced Popsicles                                    All solid food Carbonated beverages, regular and diet                                    Cranberry, grape and apple juices Sports drinks like Gatorade Lightly  seasoned clear broth or consume(fat free) Sugar, honey syrup  Sample Menu Breakfast                                Lunch                                      Supper Cranberry juice                    Beef broth                            Chicken broth Jell-O                                     Grape juice                           Apple juice Coffee or tea                        Jell-O                                      Popsicle                                                Coffee or tea                        Coffee or tea  _____________________________________________________________________  [facility] - Preparing for Surgery Before surgery, you can play an important role.  Because skin is not sterile, your skin needs to be as free of germs as possible.  You can reduce the number of germs on your skin by washing with CHG (chlorahexidine gluconate) soap before surgery.  CHG is an antiseptic cleaner which kills germs and bonds with the skin to continue killing germs even after washing. Please DO NOT use if you have an allergy to CHG or antibacterial soaps.  If your skin becomes reddened/irritated stop using the CHG and inform your nurse when you arrive at Short Stay. Do not shave (including legs and underarms) for at least 48 hours prior to the first CHG shower.  You may shave your face/neck. Please follow these instructions carefully:  1.  Shower with CHG Soap the night before surgery and the  morning of Surgery.  2.  If you choose to wash your hair, wash your hair first as usual with your  normal  shampoo.  3.  After you shampoo, rinse your hair and body thoroughly to remove the  shampoo.                           4.  Use CHG as you would any other liquid soap.  You can apply chg directly  to the skin and wash  Gently with a scrungie or clean washcloth.  5.  Apply the CHG Soap to your body ONLY FROM THE NECK DOWN.   Do not use on face/ open                           Wound or open sores. Avoid contact with eyes, ears mouth and genitals (private parts).                       Wash face,  Genitals (private  parts) with your normal soap.             6.  Wash thoroughly, paying special attention to the area where your surgery  will be performed.  7.  Thoroughly rinse your body with warm water from the neck down.  8.  DO NOT shower/wash with your normal soap after using and rinsing off  the CHG Soap.                9.  Pat yourself dry with a clean towel.            10.  Wear clean pajamas.            11.  Place clean sheets on your bed the night of your first shower and do not  sleep with pets. Day of Surgery : Do not apply any lotions/deodorants the morning of surgery.  Please wear clean clothes to the hospital/surgery center.  FAILURE TO FOLLOW THESE INSTRUCTIONS MAY RESULT IN THE CANCELLATION OF YOUR SURGERY PATIENT SIGNATURE_________________________________  NURSE SIGNATURE__________________________________  ________________________________________________________________________   [name]  An incentive spirometer is a tool that can help keep your lungs clear and active. This tool measures how well you are filling your lungs with each breath. Taking long deep breaths may help reverse or decrease the chance of developing breathing (pulmonary) problems (especially infection) following:  A long period of time when you are unable to move or be active. BEFORE THE PROCEDURE   If the spirometer includes an indicator to show your best effort, your nurse or respiratory therapist will set it to a desired goal.  If possible, sit up straight or lean slightly forward. Try not to slouch.  Hold the incentive spirometer in an upright position. INSTRUCTIONS FOR USE  1. Sit on the edge of your bed if possible, or sit up as far as you can in bed or on a chair. 2. Hold the incentive spirometer in an upright position. 3. Breathe out normally. 4. Place the mouthpiece in your mouth and seal your lips tightly around it. 5. Breathe in slowly and as deeply as possible, raising the piston or the  ball toward the top of the column. 6. Hold your breath for 3-5 seconds or for as long as possible. Allow the piston or ball to fall to the bottom of the column. 7. Remove the mouthpiece from your mouth and breathe out normally. 8. Rest for a few seconds and repeat Steps 1 through 7 at least 10 times every 1-2 hours when you are awake. Take your time and take a few normal breaths between deep breaths. 9. The spirometer may include an indicator to show your best effort. Use the indicator as a goal to work toward during each repetition. 10. After each set of 10 deep breaths, practice coughing to be sure your lungs are clear. If you have an incision (the cut made at the time of  surgery), support your incision when coughing by placing a pillow or rolled up towels firmly against it. Once you are able to get out of bed, walk around indoors and cough well. You may stop using the incentive spirometer when instructed by your caregiver.  RISKS AND COMPLICATIONS  Take your time so you do not get dizzy or light-headed.  If you are in pain, you may need to take or ask for pain medication before doing incentive spirometry. It is harder to take a deep breath if you are having pain. AFTER USE  Rest and breathe slowly and easily.  It can be helpful to keep track of a log of your progress. Your caregiver can provide you with a simple table to help with this. If you are using the spirometer at home, follow these instructions: [place] IF:   You are having difficultly using the spirometer.  You have trouble using the spirometer as often as instructed.  Your pain medication is not giving enough relief while using the spirometer.  You develop fever of 100.5 F (38.1 C) or higher. SEEK IMMEDIATE MEDICAL CARE IF:   You cough up bloody sputum that had not been present before.  You develop fever of 102 F (38.9 C) or greater.  You develop worsening pain at or near the incision site. MAKE SURE YOU:    Understand these instructions.  Will watch your condition.  Will get help right away if you are not doing well or get worse. Document Released: 06/13/2006 Document Revised: 04/25/2011 Document Reviewed: 08/14/2006 [facility] Patient Information 2014 [place], [place].   ________________________________________________________________________

## 2017-07-31 ENCOUNTER — Encounter (INDEPENDENT_AMBULATORY_CARE_PROVIDER_SITE_OTHER): Payer: Self-pay

## 2017-07-31 ENCOUNTER — Telehealth (INDEPENDENT_AMBULATORY_CARE_PROVIDER_SITE_OTHER): Payer: Self-pay | Admitting: Orthopaedic Surgery

## 2017-07-31 ENCOUNTER — Encounter (HOSPITAL_COMMUNITY): Payer: Self-pay

## 2017-07-31 ENCOUNTER — Other Ambulatory Visit: Payer: Self-pay

## 2017-07-31 ENCOUNTER — Encounter (HOSPITAL_COMMUNITY)
Admission: RE | Admit: 2017-07-31 | Discharge: 2017-07-31 | Disposition: A | Discharge status: Home / Self Care | Payer: BLUE CROSS/BLUE SHIELD | Source: Ambulatory Visit | Attending: Orthopaedic Surgery | Admitting: Orthopaedic Surgery

## 2017-07-31 DIAGNOSIS — I10 Essential (primary) hypertension: Secondary | ICD-10-CM | POA: Insufficient documentation

## 2017-07-31 DIAGNOSIS — Z01812 Encounter for preprocedural laboratory examination: Secondary | ICD-10-CM | POA: Diagnosis present

## 2017-07-31 DIAGNOSIS — E119 Type 2 diabetes mellitus without complications: Secondary | ICD-10-CM | POA: Diagnosis not present

## 2017-07-31 DIAGNOSIS — M1611 Unilateral primary osteoarthritis, right hip: Secondary | ICD-10-CM | POA: Diagnosis not present

## 2017-07-31 DIAGNOSIS — Z01810 Encounter for preprocedural cardiovascular examination: Secondary | ICD-10-CM | POA: Diagnosis present

## 2017-07-31 HISTORY — DX: Unspecified osteoarthritis, unspecified site: M19.90

## 2017-07-31 HISTORY — DX: Presence of spectacles and contact lenses: Z97.3

## 2017-07-31 LAB — CBC
HCT: 34.8 % — ABNORMAL LOW (ref 36.0–46.0)
Hemoglobin: 11.2 g/dL — ABNORMAL LOW (ref 12.0–15.0)
MCH: 27.5 pg (ref 26.0–34.0)
MCHC: 32.2 g/dL (ref 30.0–36.0)
MCV: 85.5 fL (ref 78.0–100.0)
Platelets: 421 10*3/uL — ABNORMAL HIGH (ref 150–400)
RBC: 4.07 MIL/uL (ref 3.87–5.11)
RDW: 14.6 % (ref 11.5–15.5)
WBC: 7.1 10*3/uL (ref 4.0–10.5)

## 2017-07-31 LAB — BASIC METABOLIC PANEL
Anion gap: 9 (ref 5–15)
BUN: 26 mg/dL — ABNORMAL HIGH (ref 6–20)
CO2: 28 mmol/L (ref 22–32)
Calcium: 9.6 mg/dL (ref 8.9–10.3)
Chloride: 105 mmol/L (ref 101–111)
Creatinine, Ser: 1.64 mg/dL — ABNORMAL HIGH (ref 0.44–1.00)
GFR calc Af Amer: 41 mL/min — ABNORMAL LOW (ref >60)
GFR calc non Af Amer: 35 mL/min — ABNORMAL LOW (ref >60)
Glucose, Bld: 142 mg/dL — ABNORMAL HIGH (ref 65–99)
Potassium: 5.1 mmol/L (ref 3.5–5.1)
Sodium: 142 mmol/L (ref 135–145)

## 2017-07-31 LAB — HEMOGLOBIN A1C
Hgb A1c MFr Bld: 7.2 % — ABNORMAL HIGH (ref 4.8–5.6)
Mean Plasma Glucose: 159.94 mg/dL

## 2017-07-31 LAB — SURGICAL PCR SCREEN
MRSA, PCR: NEGATIVE
Staphylococcus aureus: NEGATIVE

## 2017-07-31 LAB — HCG, SERUM, QUALITATIVE: Preg, Serum: NEGATIVE

## 2017-07-31 LAB — ABO/RH: ABO/RH(D): O POS

## 2017-07-31 LAB — GLUCOSE, CAPILLARY: Glucose-Capillary: 135 mg/dL — ABNORMAL HIGH (ref 65–99)

## 2017-07-31 NOTE — Progress Notes (Signed)
BMP ROUTED TO DR [place]

## 2017-07-31 NOTE — Telephone Encounter (Signed)
Kristen [name] from [facility] called wanting to let Dr. Ninfa Linden know that this patient has been accepted in the case management program with [employer]. Any questions give her a call back at [phone]

## 2017-08-03 MED ORDER — TRANEXAMIC ACID 1000 MG/10ML IV SOLN
1000.0000 mg | INTRAVENOUS | Status: AC
  Administered 2017-08-04: 1000 mg via INTRAVENOUS
  Filled 2017-08-03: qty 1100

## 2017-08-04 ENCOUNTER — Encounter (HOSPITAL_COMMUNITY)
Admission: RE | Disposition: A | Discharge status: Home Health | Payer: Self-pay | Source: Home / Self Care | Attending: Orthopaedic Surgery

## 2017-08-04 ENCOUNTER — Other Ambulatory Visit: Payer: Self-pay

## 2017-08-04 ENCOUNTER — Inpatient Hospital Stay (HOSPITAL_COMMUNITY): Payer: BLUE CROSS/BLUE SHIELD | Admitting: Certified Registered"

## 2017-08-04 ENCOUNTER — Encounter (HOSPITAL_COMMUNITY): Payer: Self-pay | Admitting: Certified Registered"

## 2017-08-04 ENCOUNTER — Inpatient Hospital Stay (HOSPITAL_COMMUNITY): Payer: BLUE CROSS/BLUE SHIELD

## 2017-08-04 ENCOUNTER — Inpatient Hospital Stay (HOSPITAL_COMMUNITY)
Admission: RE | Admit: 2017-08-04 | Discharge: 2017-08-06 | DRG: 470 | Disposition: A | Discharge status: Home Health | Payer: BLUE CROSS/BLUE SHIELD | Attending: Orthopaedic Surgery | Admitting: Orthopaedic Surgery

## 2017-08-04 DIAGNOSIS — Z6841 Body Mass Index (BMI) 40.0 and over, adult: Secondary | ICD-10-CM | POA: Diagnosis not present

## 2017-08-04 DIAGNOSIS — M1711 Unilateral primary osteoarthritis, right knee: Secondary | ICD-10-CM | POA: Diagnosis present

## 2017-08-04 DIAGNOSIS — Z7982 Long term (current) use of aspirin: Secondary | ICD-10-CM

## 2017-08-04 DIAGNOSIS — M1611 Unilateral primary osteoarthritis, right hip: Principal | ICD-10-CM | POA: Diagnosis present

## 2017-08-04 DIAGNOSIS — E119 Type 2 diabetes mellitus without complications: Secondary | ICD-10-CM | POA: Diagnosis present

## 2017-08-04 DIAGNOSIS — M25751 Osteophyte, right hip: Secondary | ICD-10-CM | POA: Diagnosis present

## 2017-08-04 DIAGNOSIS — Z9181 History of falling: Secondary | ICD-10-CM | POA: Diagnosis not present

## 2017-08-04 DIAGNOSIS — Z79899 Other long term (current) drug therapy: Secondary | ICD-10-CM | POA: Diagnosis not present

## 2017-08-04 DIAGNOSIS — Z7984 Long term (current) use of oral hypoglycemic drugs: Secondary | ICD-10-CM

## 2017-08-04 DIAGNOSIS — Z96641 Presence of right artificial hip joint: Secondary | ICD-10-CM

## 2017-08-04 DIAGNOSIS — I10 Essential (primary) hypertension: Secondary | ICD-10-CM | POA: Diagnosis present

## 2017-08-04 DIAGNOSIS — Z419 Encounter for procedure for purposes other than remedying health state, unspecified: Secondary | ICD-10-CM

## 2017-08-04 DIAGNOSIS — Z91013 Allergy to seafood: Secondary | ICD-10-CM | POA: Diagnosis not present

## 2017-08-04 DIAGNOSIS — Z880 Allergy status to penicillin: Secondary | ICD-10-CM | POA: Diagnosis not present

## 2017-08-04 HISTORY — PX: TOTAL HIP ARTHROPLASTY: SHX124

## 2017-08-04 LAB — TYPE AND SCREEN
ABO/RH(D): O POS
Antibody Screen: NEGATIVE

## 2017-08-04 LAB — GLUCOSE, CAPILLARY
Glucose-Capillary: 140 mg/dL — ABNORMAL HIGH (ref 65–99)
Glucose-Capillary: 159 mg/dL — ABNORMAL HIGH (ref 65–99)

## 2017-08-04 SURGERY — ARTHROPLASTY, HIP, TOTAL, ANTERIOR APPROACH
Anesthesia: General | Site: Hip | Laterality: Right

## 2017-08-04 MED ORDER — PHENYLEPHRINE 40 MCG/ML (10ML) SYRINGE FOR IV PUSH (FOR BLOOD PRESSURE SUPPORT)
PREFILLED_SYRINGE | INTRAVENOUS | Status: AC
  Filled 2017-08-04: qty 30

## 2017-08-04 MED ORDER — PROPOFOL 10 MG/ML IV BOLUS
INTRAVENOUS | Status: AC
  Filled 2017-08-04: qty 20

## 2017-08-04 MED ORDER — SUGAMMADEX SODIUM 500 MG/5ML IV SOLN
INTRAVENOUS | Status: AC
  Filled 2017-08-04: qty 5

## 2017-08-04 MED ORDER — LIDOCAINE 2% (20 MG/ML) 5 ML SYRINGE
INTRAMUSCULAR | Status: DC | PRN
  Administered 2017-08-04: 100 mg via INTRAVENOUS

## 2017-08-04 MED ORDER — FENTANYL CITRATE (PF) 250 MCG/5ML IJ SOLN
INTRAMUSCULAR | Status: AC
  Filled 2017-08-04: qty 5

## 2017-08-04 MED ORDER — EPHEDRINE 5 MG/ML INJ
INTRAVENOUS | Status: AC
  Filled 2017-08-04: qty 20

## 2017-08-04 MED ORDER — CLINDAMYCIN PHOSPHATE 900 MG/50ML IV SOLN
900.0000 mg | INTRAVENOUS | Status: AC
  Administered 2017-08-04: 900 mg via INTRAVENOUS
  Filled 2017-08-04: qty 50

## 2017-08-04 MED ORDER — ONDANSETRON HCL 4 MG/2ML IJ SOLN: 4.0000 mg | Freq: Once | INTRAMUSCULAR | Status: DC | PRN

## 2017-08-04 MED ORDER — ALBUTEROL SULFATE (2.5 MG/3ML) 0.083% IN NEBU: 2.5000 mg | INHALATION_SOLUTION | Freq: Four times a day (QID) | RESPIRATORY_TRACT | Status: DC | PRN

## 2017-08-04 MED ORDER — FENTANYL CITRATE (PF) 100 MCG/2ML IJ SOLN
INTRAMUSCULAR | Status: DC | PRN
  Administered 2017-08-04 (×2): 50 ug via INTRAVENOUS

## 2017-08-04 MED ORDER — EPHEDRINE SULFATE-NACL 50-0.9 MG/10ML-% IV SOSY
PREFILLED_SYRINGE | INTRAVENOUS | Status: DC | PRN
  Administered 2017-08-04 (×2): 5 mg via INTRAVENOUS

## 2017-08-04 MED ORDER — MIDAZOLAM HCL 2 MG/2ML IJ SOLN
INTRAMUSCULAR | Status: AC
  Filled 2017-08-04: qty 2

## 2017-08-04 MED ORDER — MIDAZOLAM HCL 2 MG/2ML IJ SOLN
INTRAMUSCULAR | Status: DC | PRN
  Administered 2017-08-04 (×2): 1 mg via INTRAVENOUS

## 2017-08-04 MED ORDER — PHENOL 1.4 % MT LIQD: 1.0000 | OROMUCOSAL | Status: DC | PRN

## 2017-08-04 MED ORDER — ACETAMINOPHEN 325 MG PO TABS
325.0000 mg | ORAL_TABLET | Freq: Four times a day (QID) | ORAL | Status: DC | PRN
  Administered 2017-08-05 – 2017-08-06 (×2): 650 mg via ORAL
  Filled 2017-08-04 (×2): qty 2

## 2017-08-04 MED ORDER — OXYCODONE HCL 5 MG PO TABS
10.0000 mg | ORAL_TABLET | ORAL | Status: DC | PRN
  Administered 2017-08-05 – 2017-08-06 (×2): 10 mg via ORAL
  Filled 2017-08-04 (×3): qty 2

## 2017-08-04 MED ORDER — FENTANYL CITRATE (PF) 100 MCG/2ML IJ SOLN
25.0000 ug | INTRAMUSCULAR | Status: DC | PRN
  Administered 2017-08-04 (×3): 50 ug via INTRAVENOUS

## 2017-08-04 MED ORDER — ALUM & MAG HYDROXIDE-SIMETH 200-200-20 MG/5ML PO SUSP: 30.0000 mL | ORAL | Status: DC | PRN

## 2017-08-04 MED ORDER — METFORMIN HCL 500 MG PO TABS
1000.0000 mg | ORAL_TABLET | Freq: Two times a day (BID) | ORAL | Status: DC
  Administered 2017-08-04 – 2017-08-06 (×4): 1000 mg via ORAL
  Filled 2017-08-04 (×4): qty 2

## 2017-08-04 MED ORDER — MENTHOL 3 MG MT LOZG: 1.0000 | LOZENGE | OROMUCOSAL | Status: DC | PRN

## 2017-08-04 MED ORDER — LORATADINE 10 MG PO TABS
10.0000 mg | ORAL_TABLET | Freq: Every day | ORAL | Status: DC
Start: 2017-08-05 — End: 2017-08-06
  Administered 2017-08-05 – 2017-08-06 (×2): 10 mg via ORAL
  Filled 2017-08-04 (×2): qty 1

## 2017-08-04 MED ORDER — METHOCARBAMOL 1000 MG/10ML IJ SOLN
500.0000 mg | Freq: Four times a day (QID) | INTRAVENOUS | Status: DC | PRN
  Administered 2017-08-04: 500 mg via INTRAVENOUS
  Filled 2017-08-04: qty 550

## 2017-08-04 MED ORDER — CHLORHEXIDINE GLUCONATE 4 % EX LIQD: 60.0000 mL | Freq: Once | CUTANEOUS | Status: DC

## 2017-08-04 MED ORDER — IRBESARTAN 150 MG PO TABS
300.0000 mg | ORAL_TABLET | Freq: Every day | ORAL | Status: DC
  Administered 2017-08-05 – 2017-08-06 (×2): 300 mg via ORAL
  Filled 2017-08-04 (×2): qty 2

## 2017-08-04 MED ORDER — LACTATED RINGERS IV SOLN
INTRAVENOUS | Status: DC | PRN
  Administered 2017-08-04 (×3): via INTRAVENOUS

## 2017-08-04 MED ORDER — LACTATED RINGERS IV SOLN
INTRAVENOUS | Status: DC
Start: 2017-08-04 — End: 2017-08-04
  Administered 2017-08-04: 12:00:00 via INTRAVENOUS

## 2017-08-04 MED ORDER — SODIUM CHLORIDE 0.9 % IV SOLN
INTRAVENOUS | Status: DC
  Administered 2017-08-04: 75 mL/h via INTRAVENOUS

## 2017-08-04 MED ORDER — OLMESARTAN MEDOXOMIL-HCTZ 40-25 MG PO TABS: 1.0000 | ORAL_TABLET | Freq: Every day | ORAL | Status: DC

## 2017-08-04 MED ORDER — ASPIRIN 81 MG PO CHEW
81.0000 mg | CHEWABLE_TABLET | Freq: Two times a day (BID) | ORAL | Status: DC
  Administered 2017-08-04 – 2017-08-06 (×4): 81 mg via ORAL
  Filled 2017-08-04 (×4): qty 1

## 2017-08-04 MED ORDER — PROPOFOL 10 MG/ML IV BOLUS
INTRAVENOUS | Status: DC | PRN
  Administered 2017-08-04: 200 mg via INTRAVENOUS

## 2017-08-04 MED ORDER — PHENYLEPHRINE 40 MCG/ML (10ML) SYRINGE FOR IV PUSH (FOR BLOOD PRESSURE SUPPORT)
PREFILLED_SYRINGE | INTRAVENOUS | Status: DC | PRN
  Administered 2017-08-04: 120 ug via INTRAVENOUS
  Administered 2017-08-04: 160 ug via INTRAVENOUS
  Administered 2017-08-04: 120 ug via INTRAVENOUS
  Administered 2017-08-04 (×2): 80 ug via INTRAVENOUS

## 2017-08-04 MED ORDER — DOCUSATE SODIUM 100 MG PO CAPS
100.0000 mg | ORAL_CAPSULE | Freq: Two times a day (BID) | ORAL | Status: DC
  Administered 2017-08-04 – 2017-08-06 (×4): 100 mg via ORAL
  Filled 2017-08-04 (×4): qty 1

## 2017-08-04 MED ORDER — HYDROMORPHONE HCL 1 MG/ML IJ SOLN: 0.5000 mg | INTRAMUSCULAR | Status: DC | PRN

## 2017-08-04 MED ORDER — METHOCARBAMOL 500 MG PO TABS
500.0000 mg | ORAL_TABLET | Freq: Four times a day (QID) | ORAL | Status: DC | PRN
  Administered 2017-08-04 – 2017-08-06 (×6): 500 mg via ORAL
  Filled 2017-08-04 (×6): qty 1

## 2017-08-04 MED ORDER — OXYCODONE HCL 5 MG PO TABS
5.0000 mg | ORAL_TABLET | ORAL | Status: DC | PRN
  Administered 2017-08-04 – 2017-08-06 (×7): 10 mg via ORAL
  Filled 2017-08-04 (×6): qty 2

## 2017-08-04 MED ORDER — METOCLOPRAMIDE HCL 5 MG/ML IJ SOLN: 5.0000 mg | Freq: Three times a day (TID) | INTRAMUSCULAR | Status: DC | PRN

## 2017-08-04 MED ORDER — ONDANSETRON HCL 4 MG PO TABS: 4.0000 mg | ORAL_TABLET | Freq: Four times a day (QID) | ORAL | Status: DC | PRN

## 2017-08-04 MED ORDER — FENTANYL CITRATE (PF) 100 MCG/2ML IJ SOLN
INTRAMUSCULAR | Status: AC
  Filled 2017-08-04: qty 2

## 2017-08-04 MED ORDER — FENTANYL CITRATE (PF) 100 MCG/2ML IJ SOLN
INTRAMUSCULAR | Status: AC
  Filled 2017-08-04: qty 4

## 2017-08-04 MED ORDER — SODIUM CHLORIDE 0.9 % IR SOLN
Status: DC | PRN
  Administered 2017-08-04: 1000 mL

## 2017-08-04 MED ORDER — PANTOPRAZOLE SODIUM 40 MG PO TBEC
40.0000 mg | DELAYED_RELEASE_TABLET | Freq: Every day | ORAL | Status: DC
  Administered 2017-08-04 – 2017-08-06 (×3): 40 mg via ORAL
  Filled 2017-08-04 (×3): qty 1

## 2017-08-04 MED ORDER — POLYETHYLENE GLYCOL 3350 17 G PO PACK: 17.0000 g | PACK | Freq: Every day | ORAL | Status: DC | PRN

## 2017-08-04 MED ORDER — HYDROCHLOROTHIAZIDE 25 MG PO TABS
25.0000 mg | ORAL_TABLET | Freq: Every day | ORAL | Status: DC
  Administered 2017-08-05 – 2017-08-06 (×2): 25 mg via ORAL
  Filled 2017-08-04 (×2): qty 1

## 2017-08-04 MED ORDER — ROCURONIUM BROMIDE 10 MG/ML (PF) SYRINGE
PREFILLED_SYRINGE | INTRAVENOUS | Status: DC | PRN
  Administered 2017-08-04: 50 mg via INTRAVENOUS

## 2017-08-04 MED ORDER — HYDROCHLOROTHIAZIDE 12.5 MG PO CAPS
12.5000 mg | ORAL_CAPSULE | Freq: Every day | ORAL | Status: DC
  Administered 2017-08-05 – 2017-08-06 (×2): 12.5 mg via ORAL
  Filled 2017-08-04 (×2): qty 1

## 2017-08-04 MED ORDER — ALBUTEROL SULFATE HFA 108 (90 BASE) MCG/ACT IN AERS: 1.0000 | INHALATION_SPRAY | Freq: Four times a day (QID) | RESPIRATORY_TRACT | Status: DC | PRN

## 2017-08-04 MED ORDER — FENTANYL CITRATE (PF) 250 MCG/5ML IJ SOLN
INTRAMUSCULAR | Status: DC | PRN
  Administered 2017-08-04 (×5): 50 ug via INTRAVENOUS

## 2017-08-04 MED ORDER — ONDANSETRON HCL 4 MG/2ML IJ SOLN
INTRAMUSCULAR | Status: DC | PRN
  Administered 2017-08-04: 4 mg via INTRAVENOUS

## 2017-08-04 MED ORDER — ADULT MULTIVITAMIN W/MINERALS CH
1.0000 | ORAL_TABLET | Freq: Every day | ORAL | Status: DC
  Administered 2017-08-05 – 2017-08-06 (×2): 1 via ORAL
  Filled 2017-08-04 (×2): qty 1

## 2017-08-04 MED ORDER — DIPHENHYDRAMINE HCL 12.5 MG/5ML PO ELIX
12.5000 mg | ORAL_SOLUTION | ORAL | Status: DC | PRN
Start: 2017-08-04 — End: 2017-08-06

## 2017-08-04 MED ORDER — CLINDAMYCIN PHOSPHATE 600 MG/50ML IV SOLN
600.0000 mg | Freq: Four times a day (QID) | INTRAVENOUS | Status: AC
  Administered 2017-08-04 – 2017-08-05 (×2): 600 mg via INTRAVENOUS
  Filled 2017-08-04 (×2): qty 50

## 2017-08-04 MED ORDER — ONDANSETRON HCL 4 MG/2ML IJ SOLN: 4.0000 mg | Freq: Four times a day (QID) | INTRAMUSCULAR | Status: DC | PRN

## 2017-08-04 MED ORDER — SUCCINYLCHOLINE CHLORIDE 200 MG/10ML IV SOSY
PREFILLED_SYRINGE | INTRAVENOUS | Status: DC | PRN
  Administered 2017-08-04: 140 mg via INTRAVENOUS

## 2017-08-04 MED ORDER — FLUOXETINE HCL 20 MG PO CAPS
20.0000 mg | ORAL_CAPSULE | Freq: Every day | ORAL | Status: DC
  Administered 2017-08-05 – 2017-08-06 (×2): 20 mg via ORAL
  Filled 2017-08-04 (×2): qty 1

## 2017-08-04 MED ORDER — LIDOCAINE 2% (20 MG/ML) 5 ML SYRINGE
INTRAMUSCULAR | Status: AC
  Filled 2017-08-04: qty 5

## 2017-08-04 MED ORDER — DEXAMETHASONE SODIUM PHOSPHATE 10 MG/ML IJ SOLN
INTRAMUSCULAR | Status: DC | PRN
  Administered 2017-08-04: 10 mg via INTRAVENOUS

## 2017-08-04 MED ORDER — METOCLOPRAMIDE HCL 5 MG PO TABS: 5.0000 mg | ORAL_TABLET | Freq: Three times a day (TID) | ORAL | Status: DC | PRN

## 2017-08-04 MED ORDER — ACETAMINOPHEN 500 MG PO TABS
1000.0000 mg | ORAL_TABLET | Freq: Once | ORAL | Status: AC
  Administered 2017-08-04: 1000 mg via ORAL
  Filled 2017-08-04: qty 2

## 2017-08-04 SURGICAL SUPPLY — 34 items
BAG ZIPLOCK 12X15 (MISCELLANEOUS) ×3 IMPLANT
BENZOIN TINCTURE PRP APPL 2/3 (GAUZE/BANDAGES/DRESSINGS) IMPLANT
BLADE SAW SGTL 18X1.27X75 (BLADE) ×2 IMPLANT
BLADE SAW SGTL 18X1.27X75MM (BLADE) ×1
CAPT HIP TOTAL 2 ×3 IMPLANT
CLOSURE WOUND 1/2 X4 (GAUZE/BANDAGES/DRESSINGS)
COVER PERINEAL POST (MISCELLANEOUS) ×3 IMPLANT
COVER SURGICAL LIGHT HANDLE (MISCELLANEOUS) ×3 IMPLANT
DRAPE STERI IOBAN 125X83 (DRAPES) ×3 IMPLANT
DRAPE U-SHAPE 47X51 STRL (DRAPES) ×6 IMPLANT
DRSG AQUACEL AG ADV 3.5X10 (GAUZE/BANDAGES/DRESSINGS) ×3 IMPLANT
DURAPREP 26ML APPLICATOR (WOUND CARE) ×3 IMPLANT
ELECT REM PT RETURN 15FT ADLT (MISCELLANEOUS) ×3 IMPLANT
GAUZE XEROFORM 1X8 LF (GAUZE/BANDAGES/DRESSINGS) ×3 IMPLANT
GLOVE BIO SURGEON STRL SZ7.5 (GLOVE) ×3 IMPLANT
GLOVE BIOGEL PI IND STRL 8 (GLOVE) ×2 IMPLANT
GLOVE BIOGEL PI INDICATOR 8 (GLOVE) ×4
GLOVE ECLIPSE 8.0 STRL XLNG CF (GLOVE) ×3 IMPLANT
GOWN STRL REUS W/TWL XL LVL3 (GOWN DISPOSABLE) ×6 IMPLANT
HANDPIECE INTERPULSE COAX TIP (DISPOSABLE) ×2
HOLDER FOLEY CATH W/STRAP (MISCELLANEOUS) ×3 IMPLANT
PACK ANTERIOR HIP CUSTOM (KITS) ×3 IMPLANT
SET HNDPC FAN SPRY TIP SCT (DISPOSABLE) ×1 IMPLANT
STAPLER VISISTAT 35W (STAPLE) IMPLANT
STRIP CLOSURE SKIN 1/2X4 (GAUZE/BANDAGES/DRESSINGS) IMPLANT
SUT ETHIBOND NAB CT1 #1 30IN (SUTURE) ×3 IMPLANT
SUT ETHILON 2 0 PS N (SUTURE) ×9 IMPLANT
SUT MNCRL AB 4-0 PS2 18 (SUTURE) ×3 IMPLANT
SUT VIC AB 0 CT1 36 (SUTURE) ×3 IMPLANT
SUT VIC AB 1 CT1 36 (SUTURE) ×3 IMPLANT
SUT VIC AB 2-0 CT1 27 (SUTURE) ×4
SUT VIC AB 2-0 CT1 TAPERPNT 27 (SUTURE) ×2 IMPLANT
TRAY FOLEY CATH 14FR (SET/KITS/TRAYS/PACK) ×3 IMPLANT
YANKAUER SUCT BULB TIP 10FT TU (MISCELLANEOUS) ×3 IMPLANT

## 2017-08-04 NOTE — Op Note (Signed)
NAME: Kristen [name], Kristen [name] MEDICAL RECORD [id number] ACCOUNT [id number] DATE OF BIRTH:11/28/1965 FACILITY: [facility] LOCATION: [facility] PHYSICIAN:Malisha Mabey [place], MD  OPERATIVE REPORT  DATE OF PROCEDURE:  08/04/2017  PREOPERATIVE DIAGNOSIS:  Primary osteoarthritis and degenerative joint disease, right hip.  POSTOPERATIVE DIAGNOSIS:  Primary osteoarthritis and degenerative joint disease, right hip.  PROCEDURE PERFORMED:  Right total hip arthroplasty through direct anterior approach.  IMPLANTS:  DePuy Sector Gription acetabular component size 50, size 32+0 polyethylene liner, size 11 Corail femoral component with standard offset, size 32+1 ceramic hip ball.  SURGEON:  Jean Rosenthal, MD  ASSISTANT:  Erskine Emery, PA-C.  ANESTHESIA:  General.  ANTIBIOTICS:  900 mg IV clindamycin.  ESTIMATED BLOOD LOSS:  300 mL.  COMPLICATIONS:  None.  INDICATIONS:  The patient is a 52 year old morbidly obese female with debilitating arthritis involving her right hip.  She has tried and failed all forms of conservative treatment.  Although her BMI is over 50, I felt comfortable proceeding with the  surgery based on the severe arthritis of her hip.  However, she does understand that given her obesity, there is a heightened risk of acute blood loss anemia, neurovascular injury, fracture, infection, dislocation and hardware failure.  She understands  our goals are to decrease pain and improve mobility and overall improve quality of life.  DESCRIPTION OF PROCEDURE:  After informed consent was obtained, her right hip was marked.  She was brought to the operating room where general anesthesia was obtained while she was on a stretcher.  A Foley catheter was placed and then traction boots were  placed on both her feet.  Next she was placed supine on the Hana fracture table with a perineal post in place and both legs on skeletal traction device and no traction applied.  Her right operative hip was  prepped and draped with DuraPrep and sterile  drapes.  Time-out was called and she identified the correct patient, correct right hip.  We then made an incision just inferior and posterior to the anterior aspects.  I then dissected through abundant adipose tissue down to the tensor fascia.  Tensor  fascia was then divided longitudinally to proceed with a direct anterior approach to the hip.  We identified and cauterized the circumflex vessels and identified the hip capsule.  I entered the hip capsule in L-type format, finding large joint effusion  and significant arthritis in her right hip.  We placed Cobra retractors around the medial and lateral femoral neck and made our femoral neck cut with an oscillating saw just proximal to the lesser trochanter.  Completed this with an osteotome.  We placed  a corkscrew to grab the femoral head and removed the femoral head in its entirety and found a large area devoid of cartilage.  We then placed a bent Hohmann over the medial acetabular rim and began reaming after removing remnants of the acetabular  labrum.  We reamed from a size 43 reamer in increments up to a size 49.  The last reamer was placed under direct visualization and direct fluoroscopy, so we could obtain our depth of reaming by inclination and anteversion.  Once I was pleased with this,  I placed the real DePuy Sector Gription acetabular component size 50 and a single screw.  I placed a 32+0 polyethylene liner for that size acetabular component.  Attention was then turned to the femur.  With the leg externally rotated 120 degrees,  extended and adducted, we placed a Mueller retractor medially and Hohman retractor behind the  greater trochanter.  We released lateral joint capsule and used a box-cutting osteotome and enter the femoral canal and a rongeur to lateralize.  We then began  broaching using the Corail broaching system from a size 8 broach, going up to a size 11.  With a size 11 in place, we  trialed a standard offset femoral neck and a 32+1 hip ball reducing the acetabulum and I was pleased with her leg length, offset, range  of motion and stability.  I did want to go a little bit longer with her given the lurch she may have in her gait from her morbid obesity and I did not want to risk any dislocation.  I then dislocated the hip myself and reduced and removed all trial  components.  We then placed the real Corail femoral component from DePuy size 11 with standard offset and the real 32+1 ceramic hip ball.  Again, reduced the acetabulum and I appreciated her leg length and stability as well as offset.  We then irrigated  the soft tissue with normal saline solution using pulsatile lavage.  We were able to close the joint capsule with interrupted #1 Ethibond suture, followed by running 0 Vicryl in tensor fascia, 0 Vicryl in the deep tissue, 2-0 Vicryl subcutaneous tissue  and interrupted 2-0 nylon skin.  Xeroform and Aquacel dressing was applied.  She was taken off the Hana table, awakened, extubated, and taken to recovery room in stable condition.  All final counts were correct.  There were no complications noted.  Of  note, Benita Stabile, PA-C, assisted in the entire case.  His assistance was crucial for facilitating all aspects of this case.  TN/NUANCE  D:08/04/2017 T:08/04/2017 JOB:[id number]

## 2017-08-04 NOTE — Anesthesia Procedure Notes (Signed)
Procedure Name: Intubation Date/Time: 08/04/2017 12:51 PM Performed by: Cynda Familia, CRNA Pre-anesthesia Checklist: Patient identified, Emergency Drugs available, Suction available and Patient being monitored Patient Re-evaluated:Patient Re-evaluated prior to induction Oxygen Delivery Method: Circle System Utilized Preoxygenation: Pre-oxygenation with 100% oxygen Induction Type: IV induction Ventilation: Mask ventilation without difficulty Laryngoscope Size: Miller and 2 Grade View: Grade I Tube type: Oral Number of attempts: 1 Airway Equipment and Method: Stylet Placement Confirmation: ETT inserted through vocal cords under direct vision,  positive ETCO2 and breath sounds checked- equal and bilateral Secured at: 23 cm Tube secured with: Tape Dental Injury: Teeth and Oropharynx as per pre-operative assessment  Comments: Smooth IV induction Turk-- intubation AM CRNA atraumatic-- teeth and mouth as preop-- bilat BS Gifford Shave

## 2017-08-04 NOTE — Brief Op Note (Signed)
08/04/2017  2:17 PM  PATIENT:  Kristen [name]  52 y.o. female  PRE-OPERATIVE DIAGNOSIS:  osteoarthritis right hip  POST-OPERATIVE DIAGNOSIS:  osteoarthritis right hip  PROCEDURE:  Procedure(s): RIGHT TOTAL HIP ARTHROPLASTY ANTERIOR APPROACH (Right)  SURGEON:  Surgeon(s) and Role:    Mcarthur Rossetti, MD - Primary  PHYSICIAN ASSISTANT: Benita Stabile, PA-C  ANESTHESIA:   general  EBL:  300 mL   COUNTS:  YES  DICTATION: .Other Dictation: Dictation Number [id number]  PLAN OF CARE: Admit to inpatient   PATIENT DISPOSITION:  PACU - hemodynamically stable.   Delay start of Pharmacological VTE agent (>24hrs) due to surgical blood loss or risk of bleeding: no

## 2017-08-04 NOTE — Transfer of Care (Signed)
Immediate Anesthesia Transfer of Care Note  Patient: Kristen [name]  Procedure(s) Performed: RIGHT TOTAL HIP ARTHROPLASTY ANTERIOR APPROACH (Right Hip)  Patient Location: PACU  Anesthesia Type:General  Level of Consciousness: awake and alert   Airway & Oxygen Therapy: Patient Spontanous Breathing and Patient connected to face mask oxygen  Post-op Assessment: Report given to RN and Post -op Vital signs reviewed and stable  Post vital signs: Reviewed and stable  Last Vitals:  Vitals Value Taken Time  BP    Temp    Pulse 95 08/04/2017  2:54 PM  Resp 9 08/04/2017  2:54 PM  SpO2 100 % 08/04/2017  2:54 PM  Vitals shown include unvalidated device data.  Last Pain:  Vitals:   08/04/17 1200  TempSrc:   PainSc: 3       Patients Stated Pain Goal: 3 (48/40/39 7953)  Complications: No apparent anesthesia complications

## 2017-08-04 NOTE — Anesthesia Procedure Notes (Signed)
Date/Time: 08/04/2017 2:45 PM Performed by: Cynda Familia, CRNA Oxygen Delivery Method: Simple face mask

## 2017-08-04 NOTE — Anesthesia Preprocedure Evaluation (Addendum)
Anesthesia Evaluation  Patient identified by MRN, date of birth, ID band Patient awake    Reviewed: Allergy & Precautions, NPO status , Patient's Chart, lab work & pertinent test results  Airway Mallampati: II  TM Distance: >3 FB Neck ROM: Full    Dental  (+) Teeth Intact, Dental Advisory Given   Pulmonary neg pulmonary ROS,    Pulmonary exam normal breath sounds clear to auscultation       Cardiovascular hypertension, Pt. on medications Normal cardiovascular exam Rhythm:Regular Rate:Normal     Neuro/Psych negative neurological ROS  negative psych ROS   GI/Hepatic negative GI ROS, Neg liver ROS,   Endo/Other  diabetes, Type 2, Oral Hypoglycemic AgentsMorbid obesity  Renal/GU Renal InsufficiencyRenal disease     Musculoskeletal  (+) Arthritis , Osteoarthritis,    Abdominal   Peds  Hematology  (+) Blood dyscrasia, anemia , Plt 421k   Anesthesia Other Findings Day of surgery medications reviewed with the patient.  Reproductive/Obstetrics                             Anesthesia Physical Anesthesia Plan  ASA: IV  Anesthesia Plan: General   Post-op Pain Management:    Induction: Intravenous  PONV Risk Score and Plan: 3 and Midazolam, Dexamethasone and Ondansetron  Airway Management Planned: Oral ETT  Additional Equipment:   Intra-op Plan:   Post-operative Plan: Extubation in OR  Informed Consent: I have reviewed the patients History and Physical, chart, labs and discussed the procedure including the risks, benefits and alternatives for the proposed anesthesia with the patient or authorized representative who has indicated his/her understanding and acceptance.   Dental advisory given  Plan Discussed with: CRNA  Anesthesia Plan Comments: (Patient declines spinal anesthesia. Will proceed with GETA.)       Anesthesia Quick Evaluation

## 2017-08-04 NOTE — H&P (Signed)
TOTAL HIP ADMISSION H&P  Patient is admitted for right total hip arthroplasty.  Subjective:  Chief Complaint: right hip pain  HPI: Kristen [name], 52 y.o. female, has a history of pain and functional disability in the right hip(s) due to arthritis and patient has failed non-surgical conservative treatments for greater than 12 weeks to include NSAID's and/or analgesics, corticosteriod injections, flexibility and strengthening excercises, use of assistive devices, weight reduction as appropriate and activity modification.  Onset of symptoms was gradual starting 3 years ago with gradually worsening course since that time.The patient noted no past surgery on the right hip(s).  Patient currently rates pain in the right hip at 10 out of 10 with activity. Patient has night pain, worsening of pain with activity and weight bearing, trendelenberg gait, pain that interfers with activities of daily living, pain with passive range of motion and crepitus. Patient has evidence of subchondral cysts, subchondral sclerosis, periarticular osteophytes and joint space narrowing by imaging studies. This condition presents safety issues increasing the risk of falls.  There is no current active infection.  Patient Active Problem List   Diagnosis Date Noted  . Unilateral primary osteoarthritis, right knee 07/06/2016  . Unilateral primary osteoarthritis, right hip 07/06/2016  . Acute pain of right knee 07/06/2016  . Pain in right hip 07/06/2016   Past Medical History:  Diagnosis Date  . Arthritis   . Diabetes ([place])    Type 2  . Hypertension   . Uses contact lenses     Past Surgical History:  Procedure Laterality Date  . COLONOSCOPY WITH PROPOFOL N/A 03/18/2016   Procedure: COLONOSCOPY WITH PROPOFOL;  Surgeon: Carol Ada, MD;  Location: [facility];  Service: Endoscopy;  Laterality: N/A;    Current Facility-Administered Medications  Medication Dose Route Frequency Provider Last Rate Last Dose  .  tranexamic acid (CYKLOKAPRON) 1,000 mg in sodium chloride 0.9 % 100 mL IVPB  1,000 mg Intravenous On Call to [place], [place], MD       Current Outpatient Medications  Medication Sig Dispense Refill Last Dose  . albuterol (PROVENTIL HFA;VENTOLIN HFA) 108 (90 Base) MCG/ACT inhaler Inhale 1-2 puffs into the lungs every 6 (six) hours as needed for wheezing or shortness of breath. 1 Inhaler 0   . aspirin EC 81 MG tablet Take 81 mg by mouth every Monday, Wednesday, and Friday. IN THE MORNING   07/01/2016 at Unknown time  . cetirizine (ZYRTEC) 10 MG tablet Take 10 mg by mouth daily.   07/02/2016 at Unknown time  . FLUoxetine (PROZAC) 20 MG tablet Take 20 mg by mouth daily.   07/02/2016 at Unknown time  . hydrochlorothiazide (MICROZIDE) 12.5 MG capsule Take 12.5 mg by mouth daily.     . metFORMIN (GLUCOPHAGE) 500 MG tablet Take 1,000 mg by mouth 2 (two) times daily with a meal.    07/02/2016 at Unknown time  . Multiple Vitamin (MULTIVITAMIN WITH MINERALS) TABS tablet Take 1 tablet by mouth daily. ONE-A-DAY WOMEN'S 50+     . naproxen sodium (ALEVE) 220 MG tablet Take 440 mg by mouth 2 (two) times daily.     [name] olmesartan-hydrochlorothiazide (BENICAR HCT) 40-25 MG tablet Take 1 tablet by mouth daily.      Allergies  Allergen Reactions  . Amoxicillin Hives    Has patient had a PCN reaction causing immediate rash, facial/tongue/throat swelling, SOB or lightheadedness with hypotension: No Has patient had a PCN reaction causing severe rash involving mucus membranes or skin necrosis: Yes Has patient had  a PCN reaction that required hospitalization No Has patient had a PCN reaction occurring within the last 10 years: No If all of the above answers are "NO", then may proceed with Cephalosporin use.   . Fish Allergy Hives    Social History   Tobacco Use  . Smoking status: Never Smoker  . Smokeless tobacco: Never Used  Substance Use Topics  . Alcohol use: Yes    Comment: OCCASIONAL     No family  history on file.   Review of Systems  Musculoskeletal: Positive for joint pain.  All other systems reviewed and are negative.   Objective:  Physical Exam  Constitutional: She is oriented to person, place, and time. She appears well-developed and well-nourished.  HENT:  Head: Normocephalic and atraumatic.  Eyes: Pupils are equal, round, and reactive to light. EOM are normal.  Neck: Normal range of motion. Neck supple.  Cardiovascular: Normal rate.  Respiratory: Effort normal and breath sounds normal.  GI: Soft. Bowel sounds are normal.  Musculoskeletal:       Right hip: She exhibits decreased range of motion, decreased strength, tenderness and bony tenderness.  Neurological: She is alert and oriented to person, place, and time.  Skin: Skin is warm and dry.  Psychiatric: She has a normal mood and affect.    Vital signs in last 24 hours:    Labs:   Estimated body mass index is 52.44 kg/m as calculated from the following:   Height as of 07/31/17: 5' 5.5" (1.664 m).   Weight as of 07/31/17: 320 lb (145.2 kg).   Imaging Review Plain radiographs demonstrate severe degenerative joint disease of the right hip(s). The bone quality appears to be good for age and reported activity level.    Preoperative templating of the joint replacement has been completed, documented, and submitted to the Operating Room personnel in order to optimize intra-operative equipment management.     Assessment/Plan:  End stage arthritis, right hip(s)  The patient history, physical examination, clinical judgement of the provider and imaging studies are consistent with end stage degenerative joint disease of the right hip(s) and total hip arthroplasty is deemed medically necessary. The treatment options including medical management, injection therapy, arthroscopy and arthroplasty were discussed at length. The risks and benefits of total hip arthroplasty were presented and reviewed. The risks due to  aseptic loosening, infection, stiffness, dislocation/subluxation,  thromboembolic complications and other imponderables were discussed.  The patient acknowledged the explanation, agreed to proceed with the plan and consent was signed. Patient is being admitted for inpatient treatment for surgery, pain control, PT, OT, prophylactic antibiotics, VTE prophylaxis, progressive ambulation and ADL's and discharge planning.The patient is planning to be discharged home with home health services

## 2017-08-05 LAB — BASIC METABOLIC PANEL
Anion gap: 8 (ref 5–15)
BUN: 23 mg/dL — ABNORMAL HIGH (ref 6–20)
CO2: 27 mmol/L (ref 22–32)
Calcium: 8.5 mg/dL — ABNORMAL LOW (ref 8.9–10.3)
Chloride: 105 mmol/L (ref 101–111)
Creatinine, Ser: 1.44 mg/dL — ABNORMAL HIGH (ref 0.44–1.00)
GFR calc Af Amer: 48 mL/min — ABNORMAL LOW (ref >60)
GFR calc non Af Amer: 41 mL/min — ABNORMAL LOW (ref >60)
Glucose, Bld: 179 mg/dL — ABNORMAL HIGH (ref 65–99)
Potassium: 5.1 mmol/L (ref 3.5–5.1)
Sodium: 140 mmol/L (ref 135–145)

## 2017-08-05 LAB — CBC
HCT: 30.5 % — ABNORMAL LOW (ref 36.0–46.0)
Hemoglobin: 9.6 g/dL — ABNORMAL LOW (ref 12.0–15.0)
MCH: 27.1 pg (ref 26.0–34.0)
MCHC: 31.5 g/dL (ref 30.0–36.0)
MCV: 86.2 fL (ref 78.0–100.0)
Platelets: 419 10*3/uL — ABNORMAL HIGH (ref 150–400)
RBC: 3.54 MIL/uL — ABNORMAL LOW (ref 3.87–5.11)
RDW: 14.5 % (ref 11.5–15.5)
WBC: 12.1 10*3/uL — ABNORMAL HIGH (ref 4.0–10.5)

## 2017-08-05 LAB — GLUCOSE, CAPILLARY
Glucose-Capillary: 162 mg/dL — ABNORMAL HIGH (ref 65–99)
Glucose-Capillary: 151 mg/dL — ABNORMAL HIGH (ref 65–99)

## 2017-08-05 NOTE — Progress Notes (Signed)
Physical Therapy Treatment Patient Details Name: Kristen [name] MRN: [id number] DOB: [birth year] Today's Date: 08/05/2017    History of Present Illness 52 yo female s/p R THA direct anterior 08/04/17. hx of DM, morbid obesity    PT Comments    Progressing well with mobility. Practiced a couple of steps just to see how she would do when she decides to go upstairs. She is planning to stay on the 1st level for a short while. Plan is for d/c home on tomorrow.   Follow Up Recommendations  Follow surgeon's recommendation for DC plan and follow-up therapies     Equipment Recommendations  Rolling walker with 5" wheels(wide)    Recommendations for Other Services OT consult     Precautions / Restrictions Precautions Precautions: Fall Restrictions Weight Bearing Restrictions: No Other Position/Activity Restrictions: WBAT    Mobility  Bed Mobility Overal bed mobility: Needs Assistance Bed Mobility: Supine to Sit          General bed mobility comments: oob in chair  Transfers Overall transfer level: Needs assistance Equipment used: Rolling walker (2 wheeled) Transfers: Sit to/from Stand Sit to Stand: Min guard         General transfer comment: close guard for safety. VCs hand placement  Ambulation/Gait Ambulation/Gait assistance: Min guard Gait Distance (Feet): 100 Feet Assistive device: Rolling walker (2 wheeled) Gait Pattern/deviations: Step-through pattern;Decreased stride length     General Gait Details: close guard for safety.    Stairs Stairs: Yes Stairs assistance: Min guard Stair Management: Two rails;Step to pattern;Forwards Number of Stairs: 2 General stair comments: up and over portable steps with use of 2 rails. VCs safety, sequence. (practiced a couple just to see how pt would tolerate it). close guard for safety.    Wheelchair Mobility    Modified Rankin (Stroke Patients Only)       Balance Overall balance assessment: Mild deficits  observed, not formally tested                                          Cognition Arousal/Alertness: Awake/alert Behavior During Therapy: WFL for tasks assessed/performed Overall Cognitive Status: Within Functional Limits for tasks assessed                                        Exercises Total Joint Exercises Hip ABduction/ADduction: AROM;Right;10 reps;Standing Long Arc Quad: AROM;Right;10 reps;Seated Knee Flexion: AROM;Right;10 reps;Standing Marching in Standing: AROM;Both;10 reps;Standing General Exercises - Lower Extremity Heel Raises: AROM;Both;10 reps;Standing    General Comments General comments (skin integrity, edema, etc.): works as a Marine scientist, 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      Pertinent Vitals/Pain Pain Assessment: 0-10 Pain Score: 6  Faces Pain Scale: Hurts a little bit Pain Location: R hip/thigh Pain Descriptors / Indicators: Tightness;Sore Pain Intervention(s): Monitored during session;Repositioned;Ice applied    Home Living Family/patient expects to be discharged to:: Private residence Living Arrangements: Spouse/significant other Available Help at Discharge: Family Type of Home: (townhome) Home Access: Level entry   Home Layout: Bed/bath upstairs;Two level Home Equipment: Cane - single point      Prior Function Level of Independence: Independent      Comments: cane   PT Goals (current goals can now be found in the care plan section) Acute Rehab PT Goals Patient Stated Goal:  home, regain independence PT Goal Formulation: With patient Time For Goal Achievement: 08/19/17 Potential to Achieve Goals: Good Progress towards PT goals: Progressing toward goals    Frequency    7X/week      PT Plan Current plan remains appropriate    Co-evaluation              AM-PAC PT "6 Clicks" Daily Activity  Outcome Measure  Difficulty turning over in bed (including adjusting bedclothes, sheets and blankets)?: A  Little Difficulty moving from lying on back to sitting on the side of the bed? : A Little Difficulty sitting down on and standing up from a chair with arms (e.g., wheelchair, bedside commode, etc,.)?: A Little Help needed moving to and from a bed to chair (including a wheelchair)?: A Little Help needed walking in hospital room?: A Little Help needed climbing 3-5 steps with a railing? : A Little 6 Click Score: 18    End of Session   Activity Tolerance: Patient tolerated treatment well Patient left: in chair;with family/visitor present;with call bell/phone within reach   PT Visit Diagnosis: Pain;Other abnormalities of gait and mobility (R26.89) Pain - Right/Left: Right Pain - part of body: Hip     Time: 1314-3888 PT Time Calculation (min) (ACUTE ONLY): 8 min  Charges:  $Gait Training: 8-22 mins                    G Codes:         Weston Anna, MPT Pager: [phone]

## 2017-08-05 NOTE — Evaluation (Signed)
Occupational Therapy Evaluation Patient Details Name: Kristen [name] MRN: [id number] DOB: November 01, 1965 Today's Date: 08/05/2017    History of Present Illness s/p RIGHT TOTAL HIP ARTHROPLASTY ANTERIOR APPROACH. PMH includes HTN and Type 2 diabetes.   Clinical Impression   Pt admitted with the above diagnoses and presents with below problem list. Pt will benefit from continued acute OT to address the below listed deficits and maximize independence with basic ADLs prior to d/c home. PTA pt was mod I with ADLs. Pt is currently min guard with functional mobility and transfers, min A for LB ADLs.       Follow Up Recommendations  No OT follow up;Supervision - Intermittent    Equipment Recommendations  3 in 1 bedside commode(may need bariatric)    Recommendations for Other Services PT consult     Precautions / Restrictions Precautions Precautions: Fall Restrictions Weight Bearing Restrictions: No Other Position/Activity Restrictions: WBAT      Mobility Bed Mobility Overal bed mobility: Needs Assistance Bed Mobility: Supine to Sit     Supine to sit: Min guard;HOB elevated     General bed mobility comments: no physical assist  Transfers Overall transfer level: Needs assistance Equipment used: Rolling walker (2 wheeled) Transfers: Sit to/from Stand Sit to Stand: Min guard;From elevated surface         General transfer comment: from elevated bed height into recliner. cues for technique with rw.    Balance Overall balance assessment: Mild deficits observed, not formally tested                                         ADL either performed or assessed with clinical judgement   ADL Overall ADL's : Needs assistance/impaired Eating/Feeding: Set up;Sitting   Grooming: Min guard;Standing   Upper Body Bathing: Set up;Sitting   Lower Body Bathing: Minimal assistance;Sit to/from stand   Upper Body Dressing : Set up;Sitting   Lower Body Dressing:  Minimal assistance;Sit to/from stand   Toilet Transfer: Min guard;Ambulation;RW;Comfort height toilet;Grab bars   Toileting- Clothing Manipulation and Hygiene: Min guard;Sit to/from stand   Tub/ Shower Transfer: Min guard;Ambulation;3 in 1;Rolling walker   Functional mobility during ADLs: Min guard;Rolling walker General ADL Comments: Pt completed bed mobility and ambulated around bed to sit up in recliner. Discussed techniques and AE for LB ADLs     Vision         Perception     Praxis      Pertinent Vitals/Pain Pain Assessment: Faces Faces Pain Scale: Hurts a little bit Pain Location: R hip Pain Descriptors / Indicators: Sore Pain Intervention(s): Premedicated before session;Repositioned;Monitored during session     Hand Dominance     Extremity/Trunk Assessment Upper Extremity Assessment Upper Extremity Assessment: Overall WFL for tasks assessed   Lower Extremity Assessment Lower Extremity Assessment: Defer to PT evaluation       Communication Communication Communication: No difficulties   Cognition Arousal/Alertness: Awake/alert Behavior During Therapy: WFL for tasks assessed/performed Overall Cognitive Status: Within Functional Limits for tasks assessed                                     General Comments  works as a Marine scientist, 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    Exercises     Shoulder Instructions      Home Living  Family/patient expects to be discharged to:: Private residence Living Arrangements: Spouse/significant other Available Help at Discharge: Family Type of Home: Other(Comment)(townhouse) Home Access: Level entry     Home Layout: Two level;Able to live on main level with bedroom/bathroom     Bathroom Shower/Tub: Walk-in shower         Home Equipment: Kasandra Knudsen - single point          Prior Functioning/Environment Level of Independence: Independent with assistive device(s)        Comments: cane        OT  Problem List: Impaired balance (sitting and/or standing);Decreased knowledge of use of DME or AE;Decreased knowledge of precautions;Obesity;Pain      OT Treatment/Interventions: Self-care/ADL training;DME and/or AE instruction;Therapeutic activities;Patient/family education;Balance training    OT Goals(Current goals can be found in the care plan section) Acute Rehab OT Goals Patient Stated Goal: home, regain independence OT Goal Formulation: With patient Time For Goal Achievement: 08/12/17 Potential to Achieve Goals: Good ADL Goals Pt Will Perform Lower Body Bathing: with modified independence;with adaptive equipment;sit to/from stand Pt Will Perform Lower Body Dressing: with modified independence;sit to/from stand;with adaptive equipment Pt Will Transfer to Toilet: with supervision;ambulating Pt Will Perform Toileting - Clothing Manipulation and hygiene: with supervision;sit to/from stand Pt Will Perform Tub/Shower Transfer: Shower transfer;with supervision;ambulating;3 in 1;rolling walker  OT Frequency: Min 2X/week   Barriers to D/C:            Co-evaluation              AM-PAC PT "6 Clicks" Daily Activity     Outcome Measure Help from another person eating meals?: None Help from another person taking care of personal grooming?: None Help from another person toileting, which includes using toliet, bedpan, or urinal?: A Little Help from another person bathing (including washing, rinsing, drying)?: A Little Help from another person to put on and taking off regular upper body clothing?: None Help from another person to put on and taking off regular lower body clothing?: A Little 6 Click Score: 21   End of Session Equipment Utilized During Treatment: Rolling walker  Activity Tolerance: Patient tolerated treatment well Patient left: in chair;with call bell/phone within reach  OT Visit Diagnosis: Other abnormalities of gait and mobility (R26.89);Pain Pain - Right/Left:  Right Pain - part of body: Hip                Time: 5188-4166 OT Time Calculation (min): 20 min Charges:  OT General Charges $OT Visit: 1 Visit OT Evaluation $OT Eval Low Complexity: 1 Low G-Codes:       Hortencia Pilar 08/05/2017, 11:18 AM

## 2017-08-05 NOTE — Evaluation (Signed)
Physical Therapy Evaluation Patient Details Name: Kristen [name] MRN: [id number] DOB: [birth year] Today's Date: 08/05/2017   History of Present Illness  52 yo female s/p R THA direct anterior 08/04/17. hx of DM, morbid obesity  Clinical Impression  On eval, pt was Min guard assist for mobility. She walked ~100 feet with a walker. Mild pain with activity. Will progress as able. Plan is for d/c home on Sunday with HHPT.     Follow Up Recommendations Follow surgeon's recommendation for DC plan and follow-up therapies    Equipment Recommendations  Rolling walker with 5" wheels(wide)    Recommendations for Other Services OT consult     Precautions / Restrictions Precautions Precautions: Fall Restrictions Weight Bearing Restrictions: No Other Position/Activity Restrictions: WBAT      Mobility  Bed Mobility Overal bed mobility: Needs Assistance Bed Mobility: Supine to Sit     Supine to sit: Min guard;HOB elevated     General bed mobility comments: oob in chair  Transfers Overall transfer level: Needs assistance Equipment used: Rolling walker (2 wheeled) Transfers: Sit to/from Stand Sit to Stand: Min guard         General transfer comment: close guard for safety. VCs hand placement  Ambulation/Gait Ambulation/Gait assistance: Min guard Gait Distance (Feet): 100 Feet Assistive device: Rolling walker (2 wheeled) Gait Pattern/deviations: Step-to pattern     General Gait Details: close guard for safety.   Stairs            Wheelchair Mobility    Modified Rankin (Stroke Patients Only)       Balance Overall balance assessment: Mild deficits observed, not formally tested                                           Pertinent Vitals/Pain Pain Assessment: 0-10 Pain Score: 3  Faces Pain Scale: Hurts a little bit Pain Location: R hip Pain Descriptors / Indicators: Sore Pain Intervention(s): Monitored during session;Repositioned;Ice  applied    Home Living Family/patient expects to be discharged to:: Private residence Living Arrangements: Spouse/significant other Available Help at Discharge: Family Type of Home: (townhome) Home Access: Level entry     Home Layout: Bed/bath upstairs;Two level Home Equipment: Cane - single point      Prior Function Level of Independence: Independent         Comments: cane     Hand Dominance        Extremity/Trunk Assessment   Upper Extremity Assessment Upper Extremity Assessment: Defer to OT evaluation    Lower Extremity Assessment Lower Extremity Assessment: Generalized weakness(s/p R THA)    Cervical / Trunk Assessment Cervical / Trunk Assessment: Normal  Communication   Communication: No difficulties  Cognition Arousal/Alertness: Awake/alert Behavior During Therapy: WFL for tasks assessed/performed Overall Cognitive Status: Within Functional Limits for tasks assessed                                        General Comments General comments (skin integrity, edema, etc.): works as a Marine scientist, 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    Exercises Total Joint Exercises Hip ABduction/ADduction: AROM;Right;10 reps;Standing Long Arc Quad: AROM;Right;10 reps;Seated Knee Flexion: AROM;Right;10 reps;Standing Marching in Standing: AROM;Both;10 reps;Standing General Exercises - Lower Extremity Heel Raises: AROM;Both;10 reps;Standing   Assessment/Plan    PT Assessment Patient  needs continued PT services  PT Problem List Decreased strength;Decreased balance;Decreased mobility;Decreased activity tolerance;Pain;Decreased knowledge of use of DME;Decreased range of motion       PT Treatment Interventions DME instruction;Gait training;Therapeutic activities;Functional mobility training;Balance training;Therapeutic exercise;Stair training;Patient/family education    PT Goals (Current goals can be found in the Care Plan section)  Acute Rehab PT  Goals Patient Stated Goal: home, regain independence PT Goal Formulation: With patient Time For Goal Achievement: 08/19/17 Potential to Achieve Goals: Good    Frequency 7X/week   Barriers to discharge        Co-evaluation               AM-PAC PT "6 Clicks" Daily Activity  Outcome Measure Difficulty turning over in bed (including adjusting bedclothes, sheets and blankets)?: A Little Difficulty moving from lying on back to sitting on the side of the bed? : A Little Difficulty sitting down on and standing up from a chair with arms (e.g., wheelchair, bedside commode, etc,.)?: A Little Help needed moving to and from a bed to chair (including a wheelchair)?: A Little Help needed walking in hospital room?: A Little Help needed climbing 3-5 steps with a railing? : A Little 6 Click Score: 18    End of Session   Activity Tolerance: Patient tolerated treatment well Patient left: in chair;with call bell/phone within reach   PT Visit Diagnosis: Pain;Other abnormalities of gait and mobility (R26.89) Pain - Right/Left: Right Pain - part of body: Hip    Time: 1460-4799 PT Time Calculation (min) (ACUTE ONLY): 10 min   Charges:   PT Evaluation $PT Eval Low Complexity: 1 Low     PT G Codes:        Weston Anna, MPT Pager: [phone]

## 2017-08-05 NOTE — Plan of Care (Signed)
Plan of care discussed with patient 

## 2017-08-05 NOTE — Anesthesia Postprocedure Evaluation (Signed)
Anesthesia Post Note  Patient: Kristen [name]  Procedure(s) Performed: RIGHT TOTAL HIP ARTHROPLASTY ANTERIOR APPROACH (Right Hip)     Patient location during evaluation: PACU Anesthesia Type: General Level of consciousness: awake and alert Pain management: pain level controlled Vital Signs Assessment: post-procedure vital signs reviewed and stable Respiratory status: spontaneous breathing, nonlabored ventilation and respiratory function stable Cardiovascular status: blood pressure returned to baseline and stable Postop Assessment: no apparent nausea or vomiting Anesthetic complications: no    Last Vitals:  Vitals:   08/05/17 1419 08/05/17 1957  BP: 105/67 134/81  Pulse: 81 82  Resp: 18 18  Temp: 36.6 C 36.8 C  SpO2: 99% 100%    Last Pain:  Vitals:   08/05/17 2012  TempSrc:   PainSc: 5                  Catalina Gravel

## 2017-08-05 NOTE — Care Management Note (Signed)
Case Management Note  Patient Details  Name: Kristen [name] MRN: [id number] Date of Birth: 1965/04/21  Subjective/Objective:  Right THA                  Action/Plan: NCM spoke to pt and offered choice for [facility]. Pt agreeable to [facility]. (preoperatively arranged with [facility]). Requested RW and 3n1, [facility] will deliver to room prior to dc.   Expected Discharge Date:                 Expected Discharge Plan:  [place]  In-House Referral:  NA  Discharge planning Services  CM Consult  Post Acute Care Choice:  Home Health Choice offered to:  Patient  DME Arranged:  3-N-1, Walker rolling DME Agency:  [facility] [place] [facility]:  PT [place]:  [facility] ([facility])  Status of Service:  Completed, signed off  If discussed at [employer], dates discussed:    Additional Comments:  Erenest Rasher, RN 08/05/2017, 2:54 PM

## 2017-08-05 NOTE — Progress Notes (Signed)
Subjective: 1 Day Post-Op Procedure(s) (LRB): RIGHT TOTAL HIP ARTHROPLASTY ANTERIOR APPROACH (Right) Patient reports pain as mild.  No complaints.   Objective: Vital signs in last 24 hours: Temp:  [97.7 F (36.5 C)-98.7 F (37.1 C)] 97.9 F (36.6 C) (06/22 0105) Pulse Rate:  [59-96] 59 (06/22 0545) Resp:  [11-20] 16 (06/22 0545) BP: (113-133)/(67-98) 121/69 (06/22 0545) SpO2:  [94 %-100 %] 99 % (06/22 0545) Weight:  [320 lb (145.2 kg)] 320 lb (145.2 kg) (06/21 1611)  Intake/Output from previous day: 06/21 0701 - 06/22 0700 In: 3113.8 [P.O.:240; I.V.:2773.8; IV Piggyback:100] Out: 1095 [Urine:795; Blood:300] Intake/Output this shift: No intake/output data recorded.  Recent Labs    08/05/17 0521  HGB 9.6*   Recent Labs    08/05/17 0521  WBC 12.1*  RBC 3.54*  HCT 30.5*  PLT 419*   Recent Labs    08/05/17 0521  NA 140  K 5.1  CL 105  CO2 27  BUN 23*  CREATININE 1.44*  GLUCOSE 179*  CALCIUM 8.5*   No results for input(s): LABPT, INR in the last 72 hours.   Right lower extremity: Sensation intact distally Intact pulses distally Dorsiflexion/Plantar flexion intact Incision: dressing C/D/I Compartment soft    Assessment/Plan: 1 Day Post-Op Procedure(s) (LRB): RIGHT TOTAL HIP ARTHROPLASTY ANTERIOR APPROACH (Right) Up with therapy  Probable discharge to home tomorrow.     Smayan Hackbart 08/05/2017, 9:11 AM

## 2017-08-06 LAB — CBC
HCT: 28.6 % — ABNORMAL LOW (ref 36.0–46.0)
Hemoglobin: 9 g/dL — ABNORMAL LOW (ref 12.0–15.0)
MCH: 27.4 pg (ref 26.0–34.0)
MCHC: 31.5 g/dL (ref 30.0–36.0)
MCV: 86.9 fL (ref 78.0–100.0)
Platelets: 391 10*3/uL (ref 150–400)
RBC: 3.29 MIL/uL — ABNORMAL LOW (ref 3.87–5.11)
RDW: 15 % (ref 11.5–15.5)
WBC: 10.1 10*3/uL (ref 4.0–10.5)

## 2017-08-06 LAB — GLUCOSE, CAPILLARY: Glucose-Capillary: 128 mg/dL — ABNORMAL HIGH (ref 65–99)

## 2017-08-06 MED ORDER — OXYCODONE HCL 5 MG PO TABS: 5.0000 mg | ORAL_TABLET | ORAL | 0 refills | Status: DC | PRN

## 2017-08-06 MED ORDER — METHOCARBAMOL 500 MG PO TABS: 500.0000 mg | ORAL_TABLET | Freq: Four times a day (QID) | ORAL | 1 refills | Status: DC | PRN

## 2017-08-06 MED ORDER — ASPIRIN 81 MG PO CHEW: 81.0000 mg | CHEWABLE_TABLET | Freq: Two times a day (BID) | ORAL | 0 refills | Status: DC

## 2017-08-06 MED ORDER — ACETAMINOPHEN 325 MG PO TABS: 325.0000 mg | ORAL_TABLET | Freq: Four times a day (QID) | ORAL | 0 refills | Status: DC | PRN

## 2017-08-06 NOTE — Progress Notes (Signed)
Patient discharged to home w/ SO. Given all belongings, instructions, prescriptions, equipment. Patient verbalized understanding of all instructions. Escorted to pov via w/c.

## 2017-08-06 NOTE — Progress Notes (Signed)
Subjective: 2 Days Post-Op Procedure(s) (LRB): RIGHT TOTAL HIP ARTHROPLASTY ANTERIOR APPROACH (Right) Patient reports pain as mild.  No dizziness , chest paim or SOB.   Objective: Vital signs in last 24 hours: Temp:  [97.9 F (36.6 C)-98.7 F (37.1 C)] 98.7 F (37.1 C) (06/23 0628) Pulse Rate:  [81-98] 98 (06/23 0628) Resp:  [17-18] 17 (06/23 0628) BP: (105-134)/(67-81) 124/69 (06/23 0628) SpO2:  [98 %-100 %] 98 % (06/23 0628)  Intake/Output from previous day: 06/22 0701 - 06/23 0700 In: 867.5 [P.O.:840; I.V.:27.5] Out: 400 [Urine:400] Intake/Output this shift: No intake/output data recorded.  Recent Labs    08/05/17 0521 08/06/17 0501  HGB 9.6* 9.0*   Recent Labs    08/05/17 0521 08/06/17 0501  WBC 12.1* 10.1  RBC 3.54* 3.29*  HCT 30.5* 28.6*  PLT 419* 391   Recent Labs    08/05/17 0521  NA 140  K 5.1  CL 105  CO2 27  BUN 23*  CREATININE 1.44*  GLUCOSE 179*  CALCIUM 8.5*   No results for input(s): LABPT, INR in the last 72 hours.  Right lower extremity: Sensation intact distally Dorsiflexion/Plantar flexion intact Incision: dressing C/D/I Compartment soft    Assessment/Plan: 2 Days Post-Op Procedure(s) (LRB): RIGHT TOTAL HIP ARTHROPLASTY ANTERIOR APPROACH (Right) Discharge home with home health    Erskine Emery 08/06/2017, 12:46 PM

## 2017-08-06 NOTE — Discharge Instructions (Signed)

## 2017-08-06 NOTE — Progress Notes (Signed)
Physical Therapy Treatment Patient Details Name: Kristen [name] MRN: [id number] DOB: August 09, 1965 Today's Date: 08/06/2017    History of Present Illness 52 yo female s/p R THA direct anterior 08/04/17. hx of DM, morbid obesity    PT Comments    The patient is progressing well today. Ready for DC.   Follow Up Recommendations  Follow surgeon's recommendation for DC plan and follow-up therapies     Equipment Recommendations  Rolling walker with 5" wheels    Recommendations for Other Services       Precautions / Restrictions Precautions Precautions: Fall Restrictions Other Position/Activity Restrictions: WBAT    Mobility  Bed Mobility Overal bed mobility: Modified Independent Bed Mobility: Supine to Sit;Sit to Supine     Supine to sit: HOB elevated     General bed mobility comments: used   belt to self assist the right leg onto bed  Transfers   Equipment used: Rolling walker (2 wheeled) Transfers: Sit to/from Stand Sit to Stand: Modified independent (Device/Increase time)         General transfer comment: close guard for safety. VCs hand placement  Ambulation/Gait Ambulation/Gait assistance: Supervision Gait Distance (Feet): 100 Feet Assistive device: Rolling walker (2 wheeled) Gait Pattern/deviations: Step-through pattern;Decreased stride length     General Gait Details: close guard for safety. smoothe gait today. Tolerating well.   Stairs             Wheelchair Mobility    Modified Rankin (Stroke Patients Only)       Balance                                            Cognition Arousal/Alertness: Awake/alert                                            Exercises Total Joint Exercises Ankle Circles/Pumps: AROM;Left;Right;10 reps Quad Sets: AROM;Right;Left;10 reps Short Arc Quad: AROM;Right;10 reps Heel Slides: AAROM;Right;10 reps Hip ABduction/ADduction: AAROM;Right;10 reps Long Arc Quad:  AROM;Right;10 reps;Seated    General Comments        Pertinent Vitals/Pain Pain Score: 2  Faces Pain Scale: Hurts a little bit Pain Location: R hip/thigh Pain Descriptors / Indicators: Tightness;Sore Pain Intervention(s): Monitored during session;Premedicated before session;Repositioned;Ice applied    Home Living                      Prior Function            PT Goals (current goals can now be found in the care plan section) Progress towards PT goals: Progressing toward goals    Frequency    7X/week      PT Plan Current plan remains appropriate    Co-evaluation              AM-PAC PT "6 Clicks" Daily Activity  Outcome Measure  Difficulty turning over in bed (including adjusting bedclothes, sheets and blankets)?: A Little Difficulty moving from lying on back to sitting on the side of the bed? : A Little Difficulty sitting down on and standing up from a chair with arms (e.g., wheelchair, bedside commode, etc,.)?: A Little Help needed moving to and from a bed to chair (including a wheelchair)?: A Little Help needed walking in hospital room?: A  Little Help needed climbing 3-5 steps with a railing? : A Little 6 Click Score: 18    End of Session   Activity Tolerance: Patient tolerated treatment well Patient left: in chair;with call bell/phone within reach Nurse Communication: Mobility status PT Visit Diagnosis: Pain;Other abnormalities of gait and mobility (R26.89) Pain - Right/Left: Right Pain - part of body: Hip     Time: 0301-3143 PT Time Calculation (min) (ACUTE ONLY): 25 min  Charges:  $Gait Training: 8-22 mins $Therapeutic Exercise: 8-22 mins                    G Codes:          Claretha Cooper 08/06/2017, 1:06 PM

## 2017-08-06 NOTE — Discharge Summary (Signed)
Patient ID: Kristen [name] MRN: [id number] DOB/AGE: 1965-03-09 52 y.o.  Admit date: 08/04/2017 Discharge date: 08/06/2017  Admission Diagnoses:  Principal Problem:   Unilateral primary osteoarthritis, right hip Active Problems:   Status post total replacement of right hip   Discharge Diagnoses:  Same  Past Medical History:  Diagnosis Date  . Arthritis   . Diabetes ([place])    Type 2  . Hypertension   . Uses contact lenses     Surgeries: Procedure(s): RIGHT TOTAL HIP ARTHROPLASTY ANTERIOR APPROACH on 08/04/2017   Consultants:   Discharged Condition: Improved  Hospital Course: Kristen [name] is an 52 y.o. female who was admitted 08/04/2017 for operative treatment ofUnilateral primary osteoarthritis, right hip. Patient has severe unremitting pain that affects sleep, daily activities, and work/hobbies. After pre-op clearance the patient was taken to the operating room on 08/04/2017 and underwent  Procedure(s): RIGHT TOTAL HIP ARTHROPLASTY ANTERIOR APPROACH.    Patient was given perioperative antibiotics:  Anti-infectives (From admission, onward)   Start     Dose/Rate Route Frequency Ordered Stop   08/04/17 1800  clindamycin (CLEOCIN) IVPB 600 mg     600 mg 100 mL/hr over 30 Minutes Intravenous Every 6 hours 08/04/17 1646 08/05/17 0128   08/04/17 1130  clindamycin (CLEOCIN) IVPB 900 mg     900 mg 100 mL/hr over 30 Minutes Intravenous On call to O.R. 08/04/17 1129 08/04/17 1337       Patient was given sequential compression devices, early ambulation, and chemoprophylaxis to prevent DVT.  Patient benefited maximally from hospital stay and there were no complications.    Recent vital signs:  Patient Vitals for the past 24 hrs:  BP Temp Temp src Pulse Resp SpO2  08/06/17 0628 124/69 98.7 F (37.1 C) Oral 98 17 98 %  08/05/17 1957 134/81 98.3 F (36.8 C) Oral 82 18 100 %  08/05/17 1419 105/67 97.9 F (36.6 C) Oral 81 18 99 %     Recent laboratory studies:   Recent Labs    08/05/17 0521 08/06/17 0501  WBC 12.1* 10.1  HGB 9.6* 9.0*  HCT 30.5* 28.6*  PLT 419* 391  NA 140  --   K 5.1  --   CL 105  --   CO2 27  --   BUN 23*  --   CREATININE 1.44*  --   GLUCOSE 179*  --   CALCIUM 8.5*  --      Discharge Medications:   Allergies as of 08/06/2017      Reactions   Amoxicillin Hives   Has patient had a PCN reaction causing immediate rash, facial/tongue/throat swelling, SOB or lightheadedness with hypotension: No Has patient had a PCN reaction causing severe rash involving mucus membranes or skin necrosis: Yes Has patient had a PCN reaction that required hospitalization No Has patient had a PCN reaction occurring within the last 10 years: No If all of the above answers are "NO", then may proceed with Cephalosporin use.   Fish Allergy Hives      Medication List    STOP taking these medications   aspirin EC 81 MG tablet Replaced by:  aspirin 81 MG chewable tablet   naproxen sodium 220 MG tablet Commonly known as:  ALEVE     TAKE these medications   acetaminophen 325 MG tablet Commonly known as:  TYLENOL Take 1-2 tablets (325-650 mg total) by mouth every 6 (six) hours as needed for mild pain (pain score 1-3 or temp > 100.5).   albuterol  108 (90 Base) MCG/ACT inhaler Commonly known as:  PROVENTIL HFA;VENTOLIN HFA Inhale 1-2 puffs into the lungs every 6 (six) hours as needed for wheezing or shortness of breath.   aspirin 81 MG chewable tablet Chew 1 tablet (81 mg total) by mouth 2 (two) times daily. Replaces:  aspirin EC 81 MG tablet   cetirizine 10 MG tablet Commonly known as:  ZYRTEC Take 10 mg by mouth daily.   FLUoxetine 20 MG tablet Commonly known as:  PROZAC Take 20 mg by mouth daily.   hydrochlorothiazide 12.5 MG capsule Commonly known as:  MICROZIDE Take 12.5 mg by mouth daily.   metFORMIN 500 MG tablet Commonly known as:  GLUCOPHAGE Take 1,000 mg by mouth 2 (two) times daily with a meal.   methocarbamol  500 MG tablet Commonly known as:  ROBAXIN Take 1 tablet (500 mg total) by mouth every 6 (six) hours as needed for muscle spasms.   multivitamin with minerals Tabs tablet Take 1 tablet by mouth daily. ONE-A-DAY WOMEN'S 50+   olmesartan-hydrochlorothiazide 40-25 MG tablet Commonly known as:  BENICAR HCT Take 1 tablet by mouth daily.   oxyCODONE 5 MG immediate release tablet Commonly known as:  Oxy IR/ROXICODONE Take 1-2 tablets (5-10 mg total) by mouth every 4 (four) hours as needed for moderate pain (pain score 4-6).            Durable Medical Equipment  (From admission, onward)        Start     Ordered   08/05/17 1516  DME 3 n 1  Once    Comments:  Bariatric   08/05/17 1515   08/04/17 1647  DME Walker rolling  Once    Question:  Patient needs a walker to treat with the following condition  Answer:  Status post total replacement of right hip   08/04/17 1646      Diagnostic Studies: Dg Pelvis Portable  Result Date: 08/04/2017 CLINICAL DATA:  Right hip replacement. EXAM: PORTABLE PELVIS 1-2 VIEWS COMPARISON:  Right hip x-rays dated Jul 06, 2016. FINDINGS: The right hip demonstrates a total arthroplasty without evidence of hardware failure or complication. There is expected intra-articular air. There is no fracture or dislocation. The alignment is anatomic. Post-surgical changes noted in the surrounding soft tissues. IMPRESSION: Interval right total hip replacement without evidence of acute postoperative complication. Electronically Signed   By: Titus Dubin M.D.   On: 08/04/2017 15:24   Dg C-arm 1-60 Min-no Report  Result Date: 08/04/2017 Fluoroscopy was utilized by the requesting physician.  No radiographic interpretation.   Dg Hip Operative Unilat With Pelvis Right  Result Date: 08/04/2017 CLINICAL DATA:  Right hip replacement EXAM: OPERATIVE RIGHT HIP WITH PELVIS COMPARISON:  None. FLUOROSCOPY TIME:  Radiation Exposure Index (as provided by the fluoroscopic device):  6.6 mGy If the device does not provide the exposure index: Fluoroscopy Time:  30 seconds Number of Acquired Images:  5 FINDINGS: Initial images again demonstrate degenerative changes of the right hip joint. Subsequent right hip prosthesis is noted in satisfactory position. No acute bony abnormality or soft tissue changes are seen. IMPRESSION: Right hip replacement Electronically Signed   By: Inez Catalina M.D.   On: 08/04/2017 14:26    Disposition:     Follow-up Information    [facility] Follow up.   Specialty:  [place] Why:  [facility] will call to arrange initial appointment Contact information: [place] [address] [phone]  [facility] Follow up.   Why:  will deliver RW and 3n1 bedside commode to room prior to discharge Contact information: [address] [place] [address] [phone]        Mcarthur Rossetti, MD. Schedule an appointment as soon as possible for a visit in 2 week(s).   Specialty:  Orthopedic Surgery Contact information: [place] [address] [phone]            Signed: Erskine Emery 08/06/2017, 1:00 PM

## 2017-08-07 ENCOUNTER — Encounter (HOSPITAL_COMMUNITY): Payer: Self-pay | Admitting: Orthopaedic Surgery

## 2017-08-07 ENCOUNTER — Telehealth (INDEPENDENT_AMBULATORY_CARE_PROVIDER_SITE_OTHER): Payer: Self-pay | Admitting: Orthopaedic Surgery

## 2017-08-07 NOTE — Telephone Encounter (Signed)
Kristen [name] -(PT) with [facility] called left voicemail message needing verbal orders for HHPT 3 wk 1 and 2 wk 1  The number to contact [facility] is [phone]

## 2017-08-07 NOTE — Telephone Encounter (Signed)
Verbal order given  

## 2017-08-08 ENCOUNTER — Telehealth (INDEPENDENT_AMBULATORY_CARE_PROVIDER_SITE_OTHER): Payer: Self-pay

## 2017-08-08 NOTE — Telephone Encounter (Signed)
Called to confirm that pt had surgery on 08/04/17

## 2017-08-21 ENCOUNTER — Ambulatory Visit (INDEPENDENT_AMBULATORY_CARE_PROVIDER_SITE_OTHER): Payer: BLUE CROSS/BLUE SHIELD | Admitting: Orthopaedic Surgery

## 2017-08-21 ENCOUNTER — Encounter (INDEPENDENT_AMBULATORY_CARE_PROVIDER_SITE_OTHER): Payer: Self-pay | Admitting: Orthopaedic Surgery

## 2017-08-21 DIAGNOSIS — Z96641 Presence of right artificial hip joint: Secondary | ICD-10-CM

## 2017-08-21 NOTE — Progress Notes (Signed)
The patient is 2 weeks status post a right total hip arthroplasty through direct intra-approach.  She is only ambulate with a cane.  She is off all pain medications.  She is already returned to work tomorrow.  She has a BMI of 52.  On exam her ligaments are equal.  Her incision is healed nicely.  I removed all the sutures and placed Steri-Strips.  There is no effusion.  There is no leg length discrepancy that significant.  She will continue increase activities as comfort allows.  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  We will see her back in 4 weeks to see how she is doing overall but no x-rays are needed.  She knows to see [place] sooner if there is any issues.

## 2017-08-25 ENCOUNTER — Other Ambulatory Visit (INDEPENDENT_AMBULATORY_CARE_PROVIDER_SITE_OTHER): Payer: Self-pay | Admitting: Physician Assistant

## 2017-08-25 NOTE — Telephone Encounter (Signed)
Continue

## 2017-09-04 ENCOUNTER — Telehealth (INDEPENDENT_AMBULATORY_CARE_PROVIDER_SITE_OTHER): Payer: Self-pay

## 2017-09-04 NOTE — Telephone Encounter (Signed)
Patient asking for a refill of BASA? Shouldn't she be done with that at this point?

## 2017-09-11 ENCOUNTER — Other Ambulatory Visit (INDEPENDENT_AMBULATORY_CARE_PROVIDER_SITE_OTHER): Payer: Self-pay | Admitting: Physician Assistant

## 2017-09-18 ENCOUNTER — Encounter (INDEPENDENT_AMBULATORY_CARE_PROVIDER_SITE_OTHER): Payer: Self-pay | Admitting: Physician Assistant

## 2017-09-18 ENCOUNTER — Ambulatory Visit (INDEPENDENT_AMBULATORY_CARE_PROVIDER_SITE_OTHER): Payer: BLUE CROSS/BLUE SHIELD | Admitting: Physician Assistant

## 2017-09-18 DIAGNOSIS — Z96641 Presence of right artificial hip joint: Secondary | ICD-10-CM

## 2017-09-18 NOTE — Progress Notes (Signed)
HPI: Kristen [name] returns today 45 days status post right total hip arthroplasty.  She states she is overall doing great.  States her range of motion strength of the hip is improving.  She said no chest pain shortness of breath fevers chills  Physical exam: Right hip surgical incisions well-healed.  Right calf supple nontender.  Dorsiflexion plantarflexion the right ankle intact.  She is able to cross her legs.  Impression: Status post right total hip arthroplasty 08/04/2017  Plan: She will work on range of motion strengthening the hip.  Scar tissue mobilization is also encouraged.  She will follow-up with [place] at 1 year postop sooner if there is any questions or concerns.  At that time we will obtain an AP pelvis and lateral view of her right hip.  Questions encouraged and answered.

## 2017-10-30 LAB — HM DIABETES EYE EXAM

## 2017-11-16 ENCOUNTER — Encounter: Payer: Self-pay | Admitting: Dietician

## 2017-11-16 ENCOUNTER — Encounter: Payer: BLUE CROSS/BLUE SHIELD | Attending: General Surgery | Admitting: Dietician

## 2017-11-16 VITALS — Ht 65.0 in | Wt 304.5 lb

## 2017-11-16 DIAGNOSIS — Z6841 Body Mass Index (BMI) 40.0 and over, adult: Secondary | ICD-10-CM

## 2017-11-16 DIAGNOSIS — Z713 Dietary counseling and surveillance: Secondary | ICD-10-CM | POA: Insufficient documentation

## 2017-11-16 NOTE — Progress Notes (Signed)
Nutrition Assessment  Proposed Surgery: Sleeve Gastrectomy  MD: Carlean Jews Kinsinger RD: Erlene Quan  Height: 5'5" Weight: 304.5lbs BMI: 50.67 Upper IBW% (UIBW): 221% (138lbs)  Patient's Goal Weight: 175lbs  Medical History: diabetes, HTN Medications and Supplements: acetaminophen as needed, 81mg  aspirin (3x a week), cetirizine, FLUoxetine, hydrochlorothiazide, magnesium oxide, metFORMIN, multivitamin, olmesartan-hydrochlorothiazide  Previous surgeries: hip replacement surgery 07/2017 Drug allergies: Amoxicillin Food allergies: fish (not shellfish) Alcohol use: rarely  Tobacco use: none  Physical activity: gradually increasing physical activity after hip replacement  Weight history: Childhood: overweight    Adolescence: overweight    Adulthood: overweight/ obese --350lbs was heaviest weight, about 7 years ago    Weight 1 year ago: about 330lbs  Dieting/ weight loss history: Nutrisystem; bariatric clinic; Saxenda; increased exercise Other history: Husband recently started on dialysis.   Patient reports working on weight loss with healthy diet changes 7 years ago after father's death. She has maintained most healthy habits, including avoidance of deep fried foods, limiting red and processed meats, limiting sweets and sugary beverages.  Dietary Recall:  Daily pattern: 3 meals and 2-3 snacks. Dining out: multiple meals per week. Breakfast: Activia yogurt Lunch: salads; sandwich [place] on whole wheat, thin slice cheese; soups (cefe at work) Architectural technologist: often out-- Chiropodist, [employer]-- tries to keep to better choices Snack(s): fruit or cheese slices; peanut butter crackers, apple with peanut butter in am; 2pm Kind bar with almond, coconut; occasionally mini pretzels on way home from work; Beverages: water, coffee, occasional soda --sprite, sierra mist 1-2x a week.   Psychosocial: Emotional eating history: not currently, but has done stress eating in the past -- has worked to change this Disordered  eating history: none   Intervention:  Patient has researched this procedure by reading materials; consulting with friend who had lap band procedure, other nursing friends.   Instructed her on pre-op diet guidelines, including liver reduction diet.   Discussed stages of the bariatric diet after surgery as well as the importance of adequate protein and fluid intake and vitamin supplementation.   Summary:  Patient has made diet and lifestyle changes in effort to lose weight and prepare for bariatric surgery. She has a good knowledge base and understanding of the lifelong changes involved.   She has solid support from family and friends.   She agrees to work on reducing caffeine intake, limiting fluids during meals, avoiding excessive snacks, and increasing physical activity prior to surgery.   She is motivated to follow the bariatric diet after surgery. From a nutrition standpoint, she is ready to proceed with the bariatric surgery program.    Plan:  Patient commits to returning for  Pre-op class prior to surgery.   She will plan to return for post-op RD visits beginning 2 weeks after surgery.

## 2017-11-16 NOTE — Patient Instructions (Signed)
   Start working to reduce caffeine intake from coffee.   Plan to eat small-moderate food portions every 3-5 hours during the day.   Begin tasting some options for protein drinks/ shakes.   Continue gradually increasing daily physical activity as tolerated.

## 2017-12-01 DIAGNOSIS — Z6841 Body Mass Index (BMI) 40.0 and over, adult: Principal | ICD-10-CM

## 2017-12-01 DIAGNOSIS — Z713 Dietary counseling and surveillance: Secondary | ICD-10-CM | POA: Diagnosis not present

## 2017-12-01 NOTE — Progress Notes (Signed)
Pre-Operative Nutrition Class:  Appt start time: 0900   End time:  1100.  Patient was seen on 12/01/17 for Pre-Operative Bariatric Surgery Education at [facility], at [facility].   Surgery date: TBD Surgery type: Sleeve Gastrectomy  [facility] at [facility]: 314.5lbs Weight today: 316.5lbs  InBody  BODY COMP RESULTS    BMI (kg/m^2) 52.7  Fat Mass (lbs) 167.0  Fat Free Mass (lbs) 39.2  Total Body Water (lbs) 110.2   Samples given per MNT protocol. Patient educated on appropriate usage:  Celebrate Vitamins Multivitamin:  Lot # [id number]   Exp: 06/2018  Celebrate Vitamins Calcium Citrate Lot # [id number] Exp: 06/2018  Premier Protein Clear:  Lot# [id number] Exp: 05/03/2018  Premier Protein Chocolate shake:  Lot# [id number] Exp: 05/23/2018  Renee Pain Protein Powder Lot # [id number] Exp: 11/2018; Lot# [id number] Exp 11/2018; Lot# [id number] Exp 11/2018  The following the learning objectives were met by the patient during this course:  Identify Pre-Op Dietary Goals and will begin 2 weeks pre-operatively  Identify appropriate sources of fluids and proteins   State protein recommendations and appropriate sources pre and post-operatively  Identify Post-Operative Dietary Goals and will follow for 2 weeks post-operatively  Identify appropriate multivitamin and calcium sources  Describe the need for physical activity post-operatively and will follow MD recommendations  State when to call healthcare provider regarding medication questions or post-operative complications  Handouts given during class include:  Pre-Op Bariatric Surgery Diet Handout  Protein Shake Handout  Post-Op Bariatric Surgery Nutrition Handout  [facility] Program Information Flyer  Support Group Information Flyer  [facility] Bariatric Supplements Price List  Follow-Up Plan: Patient will follow-up at [place], at about 2 weeks post operatively for diet advancement per MD.

## 2017-12-11 ENCOUNTER — Other Ambulatory Visit: Payer: Self-pay | Admitting: Nurse Practitioner

## 2017-12-21 ENCOUNTER — Other Ambulatory Visit: Payer: Self-pay | Admitting: General Surgery

## 2017-12-23 ENCOUNTER — Other Ambulatory Visit: Payer: Self-pay | Admitting: Internal Medicine

## 2017-12-27 ENCOUNTER — Ambulatory Visit
Admission: RE | Admit: 2017-12-27 | Discharge: 2017-12-27 | Disposition: A | Discharge status: Home / Self Care | Payer: BLUE CROSS/BLUE SHIELD | Source: Ambulatory Visit | Attending: General Surgery | Admitting: General Surgery

## 2017-12-27 DIAGNOSIS — K449 Diaphragmatic hernia without obstruction or gangrene: Secondary | ICD-10-CM | POA: Insufficient documentation

## 2017-12-27 DIAGNOSIS — R000 Tachycardia, unspecified: Secondary | ICD-10-CM | POA: Diagnosis not present

## 2017-12-27 DIAGNOSIS — R918 Other nonspecific abnormal finding of lung field: Secondary | ICD-10-CM | POA: Insufficient documentation

## 2017-12-28 ENCOUNTER — Other Ambulatory Visit: Payer: Self-pay | Admitting: Internal Medicine

## 2018-01-10 ENCOUNTER — Ambulatory Visit: Payer: Self-pay | Admitting: Internal Medicine

## 2018-01-13 ENCOUNTER — Other Ambulatory Visit: Payer: Self-pay | Admitting: Internal Medicine

## 2018-01-19 ENCOUNTER — Encounter: Payer: Self-pay | Admitting: Nurse Practitioner

## 2018-01-19 ENCOUNTER — Ambulatory Visit (INDEPENDENT_AMBULATORY_CARE_PROVIDER_SITE_OTHER): Payer: BLUE CROSS/BLUE SHIELD | Admitting: Nurse Practitioner

## 2018-01-19 ENCOUNTER — Other Ambulatory Visit: Payer: Self-pay

## 2018-01-19 VITALS — BP 128/76 | HR 107 | Temp 98.3°F | Ht 64.5 in | Wt 319.4 lb

## 2018-01-19 DIAGNOSIS — Z23 Encounter for immunization: Secondary | ICD-10-CM

## 2018-01-19 DIAGNOSIS — Z139 Encounter for screening, unspecified: Secondary | ICD-10-CM

## 2018-01-19 DIAGNOSIS — I10 Essential (primary) hypertension: Secondary | ICD-10-CM

## 2018-01-19 DIAGNOSIS — Z0000 Encounter for general adult medical examination without abnormal findings: Secondary | ICD-10-CM | POA: Diagnosis not present

## 2018-01-19 DIAGNOSIS — E119 Type 2 diabetes mellitus without complications: Secondary | ICD-10-CM

## 2018-01-19 DIAGNOSIS — F3341 Major depressive disorder, recurrent, in partial remission: Secondary | ICD-10-CM

## 2018-01-19 LAB — POCT UA - MICROALBUMIN
Albumin/Creatinine Ratio, Urine, POC: 300
Creatinine, POC: 200 mg/dL
Microalbumin Ur, POC: 80 mg/L

## 2018-01-19 LAB — POCT URINALYSIS DIPSTICK
Bilirubin, UA: NEGATIVE
Glucose, UA: NEGATIVE
Ketones, UA: NEGATIVE
Leukocytes, UA: NEGATIVE
Nitrite, UA: NEGATIVE
Protein, UA: POSITIVE — AB
Spec Grav, UA: 1.02 (ref 1.010–1.025)
Urobilinogen, UA: 1 E.U./dL
pH, UA: 5.5 (ref 5.0–8.0)

## 2018-01-19 MED ORDER — TETANUS-DIPHTH-ACELL PERTUSSIS 5-2.5-18.5 LF-MCG/0.5 IM SUSP
0.5000 mL | Freq: Once | INTRAMUSCULAR | Status: AC
  Administered 2018-01-19: 0.5 mL via INTRAMUSCULAR

## 2018-01-19 MED ORDER — METFORMIN HCL 500 MG PO TABS: 1000.0000 mg | ORAL_TABLET | Freq: Two times a day (BID) | ORAL | 1 refills | Status: DC

## 2018-01-19 MED ORDER — HYDROCHLOROTHIAZIDE 12.5 MG PO TABS: 12.5000 mg | ORAL_TABLET | Freq: Every day | ORAL | 1 refills | Status: DC

## 2018-01-19 MED ORDER — FLUOXETINE HCL 20 MG PO TABS: 20.0000 mg | ORAL_TABLET | Freq: Every day | ORAL | 1 refills | Status: DC

## 2018-01-19 MED ORDER — METFORMIN HCL ER 500 MG PO TB24: ORAL_TABLET | ORAL | 1 refills | Status: DC

## 2018-01-19 MED ORDER — ALBUTEROL SULFATE HFA 108 (90 BASE) MCG/ACT IN AERS: 1.0000 | INHALATION_SPRAY | Freq: Four times a day (QID) | RESPIRATORY_TRACT | 2 refills | Status: DC | PRN

## 2018-01-19 MED ORDER — OLMESARTAN MEDOXOMIL-HCTZ 40-25 MG PO TABS: 1.0000 | ORAL_TABLET | Freq: Every day | ORAL | 1 refills | Status: DC

## 2018-01-19 NOTE — Progress Notes (Signed)
Subjective:     Patient ID: Kristen [name] , female    DOB: Nov 26, 1965 , 52 y.o.   MRN: [id number]   Chief Complaint  Patient presents with  . Annual Exam    HPI  Here for Health Maintenance - she is planning to Bariatric surgery - gastrectomy on December 16th.  She will be out of work for 2 weeks.     The patient states she uses none for birth control. Last LMP was Patient's last menstrual period was 01/18/2018.. Negative for Dysmenorrhea and Negative for Menorrhagia Mammogram last done 03/22/17 Negative for: breast discharge, breast lump(s), breast pain and breast self exam.  Pertinent negatives include abnormal bleeding (hematology), anxiety, decreased libido, depression, difficulty falling sleep, dyspareunia, history of infertility, nocturia, sexual dysfunction, sleep disturbances, urinary incontinence, urinary urgency, vaginal discharge and vaginal itching. Diet regular.The patient states her exercise level is  minimal   . The patient's tobacco use is:  Social History   Tobacco Use  Smoking Status Never Smoker  Smokeless Tobacco Never Used  . She has been exposed to passive smoke. The patient's alcohol use is:  Social History   Substance and Sexual Activity  Alcohol Use Yes   Comment: OCCASIONAL   . Additional information: Last pap more than 3 years, next one scheduled for now, she will call to schedule with GYN.  Past Medical History:  Diagnosis Date  . Arthritis   . Diabetes ([place])    Type 2  . Hypertension   . Uses contact lenses      Family History  Problem Relation Age of Onset  . Hypertension Mother   . Diabetes Mother   . Hypertension Father   . Diabetes Father   . COPD Father   . Stroke Father   . Congestive Heart Failure Father      Current Outpatient Medications:  .  aspirin 81 MG chewable tablet, Chew 1 tablet (81 mg total) by mouth 2 (two) times daily. (Patient taking differently: Chew 81 mg by mouth 3 (three) times a week. ), Disp: 35 tablet,  Rfl: 0 .  cetirizine (ZYRTEC) 10 MG tablet, Take 10 mg by mouth daily., Disp: , Rfl:  .  magnesium oxide (MAG-OX) 400 MG tablet, Take by mouth., Disp: , Rfl:  .  albuterol (PROVENTIL HFA;VENTOLIN HFA) 108 (90 Base) MCG/ACT inhaler, Inhale 1-2 puffs into the lungs every 6 (six) hours as needed for wheezing or shortness of breath., Disp: 1 Inhaler, Rfl: 2 .  FLUoxetine (PROZAC) 20 MG tablet, Take 1 tablet (20 mg total) by mouth daily., Disp: 90 tablet, Rfl: 1 .  hydrochlorothiazide (HYDRODIURIL) 12.5 MG tablet, Take 1 tablet (12.5 mg total) by mouth daily., Disp: 90 tablet, Rfl: 1 .  metFORMIN (GLUCOPHAGE) 500 MG tablet, Take 2 tablets (1,000 mg total) by mouth 2 (two) times daily with a meal., Disp: 360 tablet, Rfl: 1 .  Multiple Vitamin (MULTIVITAMIN WITH MINERALS) TABS tablet, Take 1 tablet by mouth daily. ONE-A-DAY WOMEN'S 50+, Disp: , Rfl:  .  olmesartan-hydrochlorothiazide (BENICAR HCT) 40-25 MG tablet, Take 1 tablet by mouth daily., Disp: 90 tablet, Rfl: 1   Allergies  Allergen Reactions  . Amoxicillin Hives    Has patient had a PCN reaction causing immediate rash, facial/tongue/throat swelling, SOB or lightheadedness with hypotension: No Has patient had a PCN reaction causing severe rash involving mucus membranes or skin necrosis: Yes Has patient had a PCN reaction that required hospitalization No Has patient had a PCN reaction occurring within the  last 10 years: No If all of the above answers are "NO", then may proceed with Cephalosporin use.   . Fish Allergy Hives     Review of Systems  Constitutional: Negative.   HENT: Negative.   Eyes: Negative.   Respiratory: Negative.   Cardiovascular: Negative.   Gastrointestinal: Negative.   Endocrine: Negative.   Genitourinary: Negative.   Musculoskeletal: Negative.   Skin: Negative.   Allergic/Immunologic: Negative.   Neurological: Negative.   Hematological: Negative.   Psychiatric/Behavioral: Negative.      Today's Vitals    01/19/18 1052  BP: 128/76  Pulse: (!) 107  Temp: 98.3 F (36.8 C)  TempSrc: Oral  Weight: (!) 319 lb 6.4 oz (144.9 kg)  Height: 5' 4.5" (1.638 m)   Body mass index is 53.98 kg/m.   Objective:  Physical Exam  Constitutional: She is oriented to person, place, and time. Vital signs are normal. She appears well-developed and well-nourished.  HENT:  Head: Normocephalic and atraumatic.  Right Ear: Hearing, tympanic membrane, external ear and ear canal normal.  Left Ear: Hearing, tympanic membrane, external ear and ear canal normal.  Nose: Nose normal.  Mouth/Throat: Oropharynx is clear and moist.  Eyes: Pupils are equal, round, and reactive to light. Conjunctivae, EOM and lids are normal.  Fundoscopic exam:      The right eye shows no papilledema.       The left eye shows no papilledema.  Neck: Normal range of motion and full passive range of motion without pain. Neck supple. Carotid bruit is not present. No thyroid mass present.  Cardiovascular: Normal rate, regular rhythm, normal heart sounds, intact distal pulses and normal pulses.  No murmur heard. Pulmonary/Chest: Effort normal and breath sounds normal.  Abdominal: Soft. Bowel sounds are normal.  Musculoskeletal: Normal range of motion.  Neurological: She is alert and oriented to person, place, and time. She has normal strength. No cranial nerve deficit or sensory deficit.  Skin: Skin is warm and dry. Capillary refill takes less than 2 seconds.  Psychiatric: She has a normal mood and affect. Her behavior is normal. Judgment and thought content normal.        Assessment And Plan:       1. Health care maintenance . Behavior modifications discussed and diet history reviewed.   . Pt will continue to exercise regularly and modify diet with low GI, plant based foods and decrease intake of processed foods.  . Recommend intake of daily multivitamin, Vitamin D, and calcium.  . Recommend mammogram and colonoscopy for preventive  screenings, as well as recommend immunizations that include influenza, TDAP, and Shingles   2. Encounter for immunization  Will give tetanus vaccine today while in office. Refer to order management. TDAP will be administered to adults 48-4 years old every 10 years. - Tdap (BOOSTRIX) injection 0.5 mL  3. Encounter for screening  - HIV antibody (with reflex)  4. Type 2 diabetes mellitus without complication, without long-term current use of insulin (HCC)  Chronic, controlled  Continue with current medications  Encouraged to limit intake of sugary foods and drinks  Encouraged to increase physical activity to 150 minutes per week - Hemoglobin A1c - CBC no Diff - Lipid panel  5. Need for vaccination - Flu Vaccine QUAD 6+ mos PF IM (Fluarix Quad PF)  6. Essential hypertension . B/P is controlled.  [name] BMP ordered to check renal function.  . The importance of regular exercise and dietary modification was stressed to the patient.  [name]  Stressed importance of losing ten percent of her body weight to help with B/P control.  . The weight loss would help with decreasing cardiac and cancer risk as well.    7. Recurrent major depressive disorder, in partial remission ([place]) Chronic, controlled Continue with current medications, she feels will be better as she goes through gastric surgery     [name], FNP

## 2018-01-20 LAB — LIPID PANEL
Chol/HDL Ratio: 3.6 ratio (ref 0.0–4.4)
Cholesterol, Total: 136 mg/dL (ref 100–199)
HDL: 38 mg/dL — ABNORMAL LOW (ref >39)
LDL Calculated: 73 mg/dL (ref 0–99)
Triglycerides: 126 mg/dL (ref 0–149)
VLDL Cholesterol Cal: 25 mg/dL (ref 5–40)

## 2018-01-20 LAB — CBC
Hematocrit: 33.1 % — ABNORMAL LOW (ref 34.0–46.6)
Hemoglobin: 10.9 g/dL — ABNORMAL LOW (ref 11.1–15.9)
MCH: 25 pg — ABNORMAL LOW (ref 26.6–33.0)
MCHC: 32.9 g/dL (ref 31.5–35.7)
MCV: 76 fL — ABNORMAL LOW (ref 79–97)
Platelets: 602 10*3/uL — ABNORMAL HIGH (ref 150–450)
RBC: 4.36 x10E6/uL (ref 3.77–5.28)
RDW: 16.4 % — ABNORMAL HIGH (ref 12.3–15.4)
WBC: 7.8 10*3/uL (ref 3.4–10.8)

## 2018-01-20 LAB — HEMOGLOBIN A1C
Est. average glucose Bld gHb Est-mCnc: 206 mg/dL
Hgb A1c MFr Bld: 8.8 % — ABNORMAL HIGH (ref 4.8–5.6)

## 2018-01-20 LAB — HIV ANTIBODY (ROUTINE TESTING W REFLEX): HIV Screen 4th Generation wRfx: NONREACTIVE

## 2018-01-22 ENCOUNTER — Ambulatory Visit: Payer: Self-pay | Admitting: General Surgery

## 2018-01-24 ENCOUNTER — Other Ambulatory Visit: Payer: Self-pay

## 2018-01-24 ENCOUNTER — Encounter (HOSPITAL_COMMUNITY): Payer: Self-pay

## 2018-01-24 ENCOUNTER — Encounter (HOSPITAL_COMMUNITY)
Admission: RE | Admit: 2018-01-24 | Discharge: 2018-01-24 | Disposition: A | Discharge status: Home / Self Care | Payer: BLUE CROSS/BLUE SHIELD | Source: Ambulatory Visit | Attending: General Surgery | Admitting: General Surgery

## 2018-01-24 DIAGNOSIS — Z01812 Encounter for preprocedural laboratory examination: Secondary | ICD-10-CM | POA: Insufficient documentation

## 2018-01-24 HISTORY — DX: Acute upper respiratory infection, unspecified: J06.9

## 2018-01-24 LAB — CBC WITH DIFFERENTIAL/PLATELET
Abs Immature Granulocytes: 0.07 10*3/uL (ref 0.00–0.07)
Basophils Absolute: 0 10*3/uL (ref 0.0–0.1)
Basophils Relative: 0 %
Eosinophils Absolute: 0.1 10*3/uL (ref 0.0–0.5)
Eosinophils Relative: 1 %
HCT: 34.7 % — ABNORMAL LOW (ref 36.0–46.0)
Hemoglobin: 10.4 g/dL — ABNORMAL LOW (ref 12.0–15.0)
Immature Granulocytes: 1 %
Lymphocytes Relative: 31 %
Lymphs Abs: 2.5 10*3/uL (ref 0.7–4.0)
MCH: 24.6 pg — ABNORMAL LOW (ref 26.0–34.0)
MCHC: 30 g/dL (ref 30.0–36.0)
MCV: 82.2 fL (ref 80.0–100.0)
Monocytes Absolute: 0.6 10*3/uL (ref 0.1–1.0)
Monocytes Relative: 7 %
Neutro Abs: 4.8 10*3/uL (ref 1.7–7.7)
Neutrophils Relative %: 60 %
Platelets: 524 10*3/uL — ABNORMAL HIGH (ref 150–400)
RBC: 4.22 MIL/uL (ref 3.87–5.11)
RDW: 17.3 % — ABNORMAL HIGH (ref 11.5–15.5)
WBC: 8.1 10*3/uL (ref 4.0–10.5)
nRBC: 0 % (ref 0.0–0.2)

## 2018-01-24 LAB — COMPREHENSIVE METABOLIC PANEL
ALT: 16 U/L (ref 0–44)
AST: 34 U/L (ref 15–41)
Albumin: 3.8 g/dL (ref 3.5–5.0)
Alkaline Phosphatase: 76 U/L (ref 38–126)
Anion gap: 7 (ref 5–15)
BUN: 15 mg/dL (ref 6–20)
CO2: 29 mmol/L (ref 22–32)
Calcium: 9.3 mg/dL (ref 8.9–10.3)
Chloride: 104 mmol/L (ref 98–111)
Creatinine, Ser: 1.1 mg/dL — ABNORMAL HIGH (ref 0.44–1.00)
GFR calc Af Amer: >60 mL/min (ref >60)
GFR calc non Af Amer: 58 mL/min — ABNORMAL LOW (ref >60)
Glucose, Bld: 208 mg/dL — ABNORMAL HIGH (ref 70–99)
Potassium: 4.1 mmol/L (ref 3.5–5.1)
Sodium: 140 mmol/L (ref 135–145)
Total Bilirubin: 0.9 mg/dL (ref 0.3–1.2)
Total Protein: 7.9 g/dL (ref 6.5–8.1)

## 2018-01-24 LAB — HEMOGLOBIN A1C
Hgb A1c MFr Bld: 8.7 % — ABNORMAL HIGH (ref 4.8–5.6)
Mean Plasma Glucose: 202.99 mg/dL

## 2018-01-24 LAB — GLUCOSE, CAPILLARY: Glucose-Capillary: 208 mg/dL — ABNORMAL HIGH (ref 70–99)

## 2018-01-24 MED ORDER — ENSURE PRE-SURGERY PO LIQD
296.0000 mL | Freq: Once | ORAL | Status: DC
  Filled 2018-01-24: qty 296

## 2018-01-24 NOTE — Progress Notes (Signed)
EKG 12-27-17 Epic CHEST XRAY 12-27-17 EPIC

## 2018-01-24 NOTE — Patient Instructions (Signed)
Kristen [name]  01/24/2018   Your procedure is scheduled on: 01-29-18  Report to [facility] Main  Entrance  Report to admitting at 30  AM  MORNING OF SURGERY DRINK:  [place], DRINK ALL OF THE SHAKE AT [place].   NO SOLID FOOD AFTER 600 PM THE NIGHT BEFORE YOUR SURGERY. YOU MAY DRINK CLEAR FLUIDS. THE SHAKE YOU DRINK BEFORE YOU LEAVE HOME WILL BE THE LAST FLUIDS YOU DRINK BEFORE SURGERY. DRINK PRESURGERY SHAKE AT 824 AM.  PAIN IS EXPECTED AFTER SURGERY AND WILL NOT BE COMPLETELY ELIMINATED. AMBULATION AND TYLENOL WILL HELP REDUCE INCISIONAL AND GAS PAIN. MOVEMENT IS KEY!  YOU ARE EXPECTED TO BE OUT OF BED WITHIN 4 HOURS OF ADMISSION TO YOUR PATIENT ROOM.  SITTING IN THE RECLINER THROUGHOUT THE DAY IS IMPORTANT FOR DRINKING FLUIDS AND MOVING GAS THROUGHOUT THE GI TRACT.  COMPRESSION STOCKINGS SHOULD BE WORN [place] UNLESS YOU ARE WALKING.   INCENTIVE SPIROMETER SHOULD BE USED EVERY HOUR WHILE AWAKE TO DECREASE POST-OPERATIVE COMPLICATIONS SUCH AS PNEUMONIA.  WHEN DISCHARGED HOME, IT IS IMPORTANT TO CONTINUE TO WALK EVERY HOUR AND USE THE INCENTIVE SPIROMETER EVERY HOUR.      CLEAR LIQUID DIET   Foods Allowed                                                                     Foods Excluded  Coffee and tea, regular and decaf                             liquids that you cannot  Plain Jell-O in any flavor                                             see through such as: Fruit ices (not with fruit pulp)                                     milk, soups, orange juice  Iced Popsicles                                    All solid food Carbonated beverages, regular and diet                                    Cranberry, grape and apple juices Sports drinks like Gatorade Lightly seasoned clear broth or consume(fat free) Sugar, honey syrup  Sample Menu Breakfast                                Lunch  Supper Cranberry juice                    Beef broth                            Chicken broth Jell-O                                     Grape juice                           Apple juice Coffee or tea                        Jell-O                                      Popsicle                                                Coffee or tea                        Coffee or tea  _____________________________________________________________________         Call this number if you have problems the morning of surgery [phone]   Remember: Do not eat food or drink liquids :After Midnight. BRUSH YOUR TEETH MORNING OF SURGERY AND RINSE YOUR MOUTH OUT, NO CHEWING GUM CANDY OR MINTS.  How to Manage Your Diabetes Before and After Surgery  Why is it important to control my blood sugar before and after surgery? . Improving blood sugar levels before and after surgery helps healing and can limit problems. . A way of improving blood sugar control is eating a healthy diet by: o  Eating less sugar and carbohydrates o  Increasing activity/exercise o  Talking with your doctor about reaching your blood sugar goals . High blood sugars (greater than 180 mg/dL) can raise your risk of infections and slow your recovery, so you will need to focus on controlling your diabetes during the weeks before surgery. . Make sure that the doctor who takes care of your diabetes knows about your planned surgery including the date and location.  How do I manage my blood sugar before surgery? . Check your blood sugar at least 4 times a day, starting 2 days before surgery, to make sure that the level is not too high or low. o Check your blood sugar the morning of your surgery when you wake up and every 2 hours until you get to the Short Stay unit. . If your blood sugar is less than 70 mg/dL, you will need to treat for low blood sugar: o Do not take insulin. o Treat a low blood sugar (less than 70 mg/dL) with   cup of clear juice (cranberry or apple), 4 glucose tablets, OR glucose gel. o Recheck blood sugar in 15 minutes after treatment (to make sure it is greater than 70 mg/dL). If your blood sugar is not greater than 70 mg/dL on recheck, call [phone] for further instructions. . Report your blood sugar to the short  stay nurse when you get to Short Stay.  . If you are admitted to the hospital after surgery: o Your blood sugar will be checked by the staff and you will probably be given insulin after surgery (instead of oral diabetes medicines) to make sure you have good blood sugar levels. o The goal for blood sugar control after surgery is 80-180 mg/dL.   WHAT DO I DO ABOUT MY DIABETES MEDICATION?  [name] Do not take oral diabetes medicines (pills) the morning of surgery.  . THE  DAY BEFORE SURGERY TAKE YOUR METFORMIN AS USUAL     THE MORNING OF SURGERY DO NOT TAKE YOUR METFORMIN .        Patient Signature:  Date:   Nurse Signature:  Date:   Reviewed and Endorsed by [facility] Patient Education Committee, August 2015   Take these medicines the morning of surgery with A SIP OF WATER: ALBUTEROL INHALER IF NEEDED AND BRING YOUR INHALER, FLUOXETINE (PROZAC), CERTRIZINE (ZYRTEC) DO NOT TAKE ANY DIABETIC MEDICATIONS DAY OF YOUR SURGERY                               You may not have any metal on your body including hair pins and              piercings  Do not wear jewelry, make-up, lotions, powders or perfumes, deodorant             Do not wear nail polish.  Do not shave  48 hours prior to surgery.              Men may shave face and neck.   Do not bring valuables to the hospital. [place].  Contacts, dentures or bridgework may not be worn into surgery.  Leave suitcase in the car. After surgery it may be brought to your room.                   Please read over the following fact sheets you were  given: _____________________________________________________________________  [facility] - Preparing for Surgery Before surgery, you can play an important role.  Because skin is not sterile, your skin needs to be as free of germs as possible.  You can reduce the number of germs on your skin by washing with CHG (chlorahexidine gluconate) soap before surgery.  CHG is an antiseptic cleaner which kills germs and bonds with the skin to continue killing germs even after washing. Please DO NOT use if you have an allergy to CHG or antibacterial soaps.  If your skin becomes reddened/irritated stop using the CHG and inform your nurse when you arrive at Short Stay. Do not shave (including legs and underarms) for at least 48 hours prior to the first CHG shower.  You may shave your face/neck. Please follow these instructions carefully:  1.  Shower with CHG Soap the night before surgery and the  morning of Surgery.  2.  If you choose to wash your hair, wash your hair first as usual with your  normal  shampoo.  3.  After you shampoo, rinse your hair and body thoroughly to remove the  shampoo.                           4.  Use  CHG as you would any other liquid soap.  You can apply chg directly  to the skin and wash                       Gently with a scrungie or clean washcloth.  5.  Apply the CHG Soap to your body ONLY FROM THE NECK DOWN.   Do not use on face/ open                           Wound or open sores. Avoid contact with eyes, ears mouth and genitals (private parts).                       Wash face,  Genitals (private parts) with your normal soap.             6.  Wash thoroughly, paying special attention to the area where your surgery  will be performed.  7.  Thoroughly rinse your body with warm water from the neck down.  8.  DO NOT shower/wash with your normal soap after using and rinsing off  the CHG Soap.                9.  Pat yourself dry with a clean towel.            10.  Wear clean pajamas.             11.  Place clean sheets on your bed the night of your first shower and do not  sleep with pets. Day of Surgery : Do not apply any lotions/deodorants the morning of surgery.  Please wear clean clothes to the hospital/surgery center.   ________________________________________________________________________   Incentive Spirometer  An incentive spirometer is a tool that can help keep your lungs clear and active. This tool measures how well you are filling your lungs with each breath. Taking long deep breaths may help reverse or decrease the chance of developing breathing (pulmonary) problems (especially infection) following:  A long period of time when you are unable to move or be active. BEFORE THE PROCEDURE   If the spirometer includes an indicator to show your best effort, your nurse or respiratory therapist will set it to a desired goal.  If possible, sit up straight or lean slightly forward. Try not to slouch.  Hold the incentive spirometer in an upright position. INSTRUCTIONS FOR USE  1. Sit on the edge of your bed if possible, or sit up as far as you can in bed or on a chair. 2. Hold the incentive spirometer in an upright position. 3. Breathe out normally. 4. Place the mouthpiece in your mouth and seal your lips tightly around it. 5. Breathe in slowly and as deeply as possible, raising the piston or the ball toward the top of the column. 6. Hold your breath for 3-5 seconds or for as long as possible. Allow the piston or ball to fall to the bottom of the column. 7. Remove the mouthpiece from your mouth and breathe out normally. 8. Rest for a few seconds and repeat Steps 1 through 7 at least 10 times every 1-2 hours when you are awake. Take your time and take a few normal breaths between deep breaths. 9. The spirometer may include an indicator to show your best effort. Use the indicator as a goal to work toward during each repetition. 10. After each set of 10  deep breaths,  practice coughing to be sure your lungs are clear. If you have an incision (the cut made at the time of surgery), support your incision when coughing by placing a pillow or rolled up towels firmly against it. Once you are able to get out of bed, walk around indoors and cough well. You may stop using the incentive spirometer when instructed by your caregiver.  RISKS AND COMPLICATIONS  Take your time so you do not get dizzy or light-headed.  If you are in pain, you may need to take or ask for pain medication before doing incentive spirometry. It is harder to take a deep breath if you are having pain. AFTER USE  Rest and breathe slowly and easily.  It can be helpful to keep track of a log of your progress. Your caregiver can provide you with a simple table to help with this. If you are using the spirometer at home, follow these instructions: [place] IF:   You are having difficultly using the spirometer.  You have trouble using the spirometer as often as instructed.  Your pain medication is not giving enough relief while using the spirometer.  You develop fever of 100.5 F (38.1 C) or higher. SEEK IMMEDIATE MEDICAL CARE IF:   You cough up bloody sputum that had not been present before.  You develop fever of 102 F (38.9 C) or greater.  You develop worsening pain at or near the incision site. MAKE SURE YOU:   Understand these instructions.  Will watch your condition.  Will get help right away if you are not doing well or get worse. Document Released: 06/13/2006 Document Revised: 04/25/2011 Document Reviewed: 08/14/2006 ExitCare Patient Information 2014 ExitCare, [place].   ________________________________________________________________________  WHAT IS A BLOOD TRANSFUSION? Blood Transfusion Information  A transfusion is the replacement of blood or some of its parts. Blood is made up of multiple cells which provide different functions.  Red blood cells carry oxygen  and are used for blood loss replacement.  White blood cells fight against infection.  Platelets control bleeding.  Plasma helps clot blood.  Other blood products are available for specialized needs, such as hemophilia or other clotting disorders. BEFORE THE TRANSFUSION  Who gives blood for transfusions?   Healthy volunteers who are fully evaluated to make sure their blood is safe. This is blood bank blood. Transfusion therapy is the safest it has ever been in the practice of medicine. Before blood is taken from a donor, a complete history is taken to make sure that person has no history of diseases nor engages in risky social behavior (examples are intravenous drug use or sexual activity with multiple partners). The donor's travel history is screened to minimize risk of transmitting infections, such as malaria. The donated blood is tested for signs of infectious diseases, such as HIV and hepatitis. The blood is then tested to be sure it is compatible with you in order to minimize the chance of a transfusion reaction. If you or a relative donates blood, this is often done in anticipation of surgery and is not appropriate for emergency situations. It takes many days to process the donated blood. RISKS AND COMPLICATIONS Although transfusion therapy is very safe and saves many lives, the main dangers of transfusion include:   Getting an infectious disease.  Developing a transfusion reaction. This is an allergic reaction to something in the blood you were given. Every precaution is taken to prevent this. The decision to have a blood transfusion  has been considered carefully by your caregiver before blood is given. Blood is not given unless the benefits outweigh the risks. AFTER THE TRANSFUSION  Right after receiving a blood transfusion, you will usually feel much better and more energetic. This is especially true if your red blood cells have gotten low (anemic). The transfusion raises the level of  the red blood cells which carry oxygen, and this usually causes an energy increase.  The nurse administering the transfusion will monitor you carefully for complications. HOME CARE INSTRUCTIONS  No special instructions are needed after a transfusion. You may find your energy is better. Speak with your caregiver about any limitations on activity for underlying diseases you may have. SEEK MEDICAL CARE IF:   Your condition is not improving after your transfusion.  You develop redness or irritation at the intravenous (IV) site. SEEK IMMEDIATE MEDICAL CARE IF:  Any of the following symptoms occur over the next 12 hours:  Shaking chills.  You have a temperature by mouth above 102 F (38.9 C), not controlled by medicine.  Chest, back, or muscle pain.  People around you feel you are not acting correctly or are confused.  Shortness of breath or difficulty breathing.  Dizziness and fainting.  You get a rash or develop hives.  You have a decrease in urine output.  Your urine turns a dark color or changes to pink, red, or brown. Any of the following symptoms occur over the next 10 days:  You have a temperature by mouth above 102 F (38.9 C), not controlled by medicine.  Shortness of breath.  Weakness after normal activity.  The white part of the eye turns yellow (jaundice).  You have a decrease in the amount of urine or are urinating less often.  Your urine turns a dark color or changes to pink, red, or brown. Document Released: 01/29/2000 Document Revised: 04/25/2011 Document Reviewed: 09/17/2007 [facility] Patient Information 2014 [place], [place].  _______________________________________________________________________

## 2018-01-24 NOTE — Progress Notes (Signed)
hemaglobin a1c done at pst visit due to patient states previous prednisone has driven up my hemaglobin a1c.

## 2018-01-25 DIAGNOSIS — Z01812 Encounter for preprocedural laboratory examination: Secondary | ICD-10-CM | POA: Diagnosis not present

## 2018-01-26 NOTE — Progress Notes (Signed)
HEMAGLOBIN A1C RESULTS ROUTED TO DR Linda Hedges

## 2018-01-28 MED ORDER — GENTAMICIN SULFATE 40 MG/ML IJ SOLN
1.5000 mg/kg | INTRAVENOUS | Status: AC
  Administered 2018-01-29: 220 mg via INTRAVENOUS
  Filled 2018-01-28: qty 5.5

## 2018-01-28 MED ORDER — BUPIVACAINE LIPOSOME 1.3 % IJ SUSP
20.0000 mL | Freq: Once | INTRAMUSCULAR | Status: DC
  Filled 2018-01-28: qty 20

## 2018-01-29 ENCOUNTER — Inpatient Hospital Stay (HOSPITAL_COMMUNITY): Payer: BLUE CROSS/BLUE SHIELD | Admitting: Certified Registered"

## 2018-01-29 ENCOUNTER — Encounter (HOSPITAL_COMMUNITY)
Admission: RE | Disposition: A | Discharge status: Home / Self Care | Payer: Self-pay | Source: Home / Self Care | Attending: General Surgery

## 2018-01-29 ENCOUNTER — Inpatient Hospital Stay (HOSPITAL_COMMUNITY)
Admission: RE | Admit: 2018-01-29 | Discharge: 2018-01-30 | DRG: 621 | Disposition: A | Discharge status: Home / Self Care | Payer: BLUE CROSS/BLUE SHIELD | Attending: General Surgery | Admitting: General Surgery

## 2018-01-29 ENCOUNTER — Encounter (HOSPITAL_COMMUNITY): Payer: Self-pay | Admitting: *Deleted

## 2018-01-29 ENCOUNTER — Other Ambulatory Visit: Payer: Self-pay

## 2018-01-29 DIAGNOSIS — Z833 Family history of diabetes mellitus: Secondary | ICD-10-CM | POA: Diagnosis not present

## 2018-01-29 DIAGNOSIS — E669 Obesity, unspecified: Secondary | ICD-10-CM | POA: Diagnosis present

## 2018-01-29 DIAGNOSIS — Z91013 Allergy to seafood: Secondary | ICD-10-CM | POA: Diagnosis not present

## 2018-01-29 DIAGNOSIS — Z823 Family history of stroke: Secondary | ICD-10-CM

## 2018-01-29 DIAGNOSIS — Z880 Allergy status to penicillin: Secondary | ICD-10-CM

## 2018-01-29 DIAGNOSIS — Z7984 Long term (current) use of oral hypoglycemic drugs: Secondary | ICD-10-CM | POA: Diagnosis not present

## 2018-01-29 DIAGNOSIS — Z973 Presence of spectacles and contact lenses: Secondary | ICD-10-CM | POA: Diagnosis not present

## 2018-01-29 DIAGNOSIS — Z6841 Body Mass Index (BMI) 40.0 and over, adult: Secondary | ICD-10-CM | POA: Diagnosis present

## 2018-01-29 DIAGNOSIS — Z79899 Other long term (current) drug therapy: Secondary | ICD-10-CM

## 2018-01-29 DIAGNOSIS — Z96641 Presence of right artificial hip joint: Secondary | ICD-10-CM | POA: Diagnosis present

## 2018-01-29 DIAGNOSIS — M1990 Unspecified osteoarthritis, unspecified site: Secondary | ICD-10-CM | POA: Diagnosis present

## 2018-01-29 DIAGNOSIS — I10 Essential (primary) hypertension: Secondary | ICD-10-CM | POA: Diagnosis present

## 2018-01-29 DIAGNOSIS — Z7982 Long term (current) use of aspirin: Secondary | ICD-10-CM | POA: Diagnosis not present

## 2018-01-29 DIAGNOSIS — E119 Type 2 diabetes mellitus without complications: Secondary | ICD-10-CM | POA: Diagnosis present

## 2018-01-29 DIAGNOSIS — Z8249 Family history of ischemic heart disease and other diseases of the circulatory system: Secondary | ICD-10-CM | POA: Diagnosis not present

## 2018-01-29 DIAGNOSIS — Z825 Family history of asthma and other chronic lower respiratory diseases: Secondary | ICD-10-CM

## 2018-01-29 HISTORY — PX: LAPAROSCOPIC GASTRIC SLEEVE RESECTION: SHX5895

## 2018-01-29 LAB — TYPE AND SCREEN
ABO/RH(D): O POS
Antibody Screen: NEGATIVE

## 2018-01-29 LAB — HEMOGLOBIN AND HEMATOCRIT, BLOOD
HCT: 34.9 % — ABNORMAL LOW (ref 36.0–46.0)
Hemoglobin: 10.4 g/dL — ABNORMAL LOW (ref 12.0–15.0)

## 2018-01-29 LAB — GLUCOSE, CAPILLARY
Glucose-Capillary: 295 mg/dL — ABNORMAL HIGH (ref 70–99)
Glucose-Capillary: 188 mg/dL — ABNORMAL HIGH (ref 70–99)
Glucose-Capillary: 287 mg/dL — ABNORMAL HIGH (ref 70–99)
Glucose-Capillary: 275 mg/dL — ABNORMAL HIGH (ref 70–99)

## 2018-01-29 LAB — PREGNANCY, URINE: Preg Test, Ur: NEGATIVE

## 2018-01-29 SURGERY — GASTRECTOMY, SLEEVE, LAPAROSCOPIC
Anesthesia: General | Site: Abdomen

## 2018-01-29 MED ORDER — APREPITANT 40 MG PO CAPS
40.0000 mg | ORAL_CAPSULE | ORAL | Status: AC
  Administered 2018-01-29: 40 mg via ORAL
  Filled 2018-01-29: qty 1

## 2018-01-29 MED ORDER — CHLORHEXIDINE GLUCONATE 4 % EX LIQD: 60.0000 mL | Freq: Once | CUTANEOUS | Status: DC

## 2018-01-29 MED ORDER — LIDOCAINE 2% (20 MG/ML) 5 ML SYRINGE
INTRAMUSCULAR | Status: AC
  Filled 2018-01-29: qty 5

## 2018-01-29 MED ORDER — ENOXAPARIN SODIUM 30 MG/0.3ML ~~LOC~~ SOLN
30.0000 mg | Freq: Two times a day (BID) | SUBCUTANEOUS | Status: DC
  Administered 2018-01-29 – 2018-01-30 (×2): 30 mg via SUBCUTANEOUS
  Filled 2018-01-29 (×2): qty 0.3

## 2018-01-29 MED ORDER — INSULIN ASPART 100 UNIT/ML ~~LOC~~ SOLN
0.0000 [IU] | Freq: Three times a day (TID) | SUBCUTANEOUS | Status: DC
  Administered 2018-01-29: 8 [IU] via SUBCUTANEOUS
  Administered 2018-01-30: 3 [IU] via SUBCUTANEOUS

## 2018-01-29 MED ORDER — ACETAMINOPHEN 160 MG/5ML PO SOLN
650.0000 mg | Freq: Four times a day (QID) | ORAL | Status: DC
  Administered 2018-01-29 – 2018-01-30 (×3): 650 mg via ORAL
  Filled 2018-01-29 (×3): qty 20.3

## 2018-01-29 MED ORDER — LORATADINE 10 MG PO TABS
10.0000 mg | ORAL_TABLET | Freq: Every day | ORAL | Status: DC
  Administered 2018-01-30: 10 mg via ORAL
  Filled 2018-01-29: qty 1

## 2018-01-29 MED ORDER — ROCURONIUM BROMIDE 10 MG/ML (PF) SYRINGE
PREFILLED_SYRINGE | INTRAVENOUS | Status: AC
  Filled 2018-01-29: qty 10

## 2018-01-29 MED ORDER — STERILE WATER FOR IRRIGATION IR SOLN
Status: DC | PRN
  Administered 2018-01-29: 1000 mL

## 2018-01-29 MED ORDER — MORPHINE SULFATE (PF) 4 MG/ML IV SOLN: 1.0000 mg | INTRAVENOUS | Status: DC | PRN

## 2018-01-29 MED ORDER — LIDOCAINE HCL 2 % IJ SOLN
INTRAMUSCULAR | Status: AC
  Filled 2018-01-29: qty 20

## 2018-01-29 MED ORDER — SCOPOLAMINE 1 MG/3DAYS TD PT72
1.0000 | MEDICATED_PATCH | TRANSDERMAL | Status: DC
  Administered 2018-01-29: 1.5 mg via TRANSDERMAL
  Filled 2018-01-29: qty 1

## 2018-01-29 MED ORDER — SIMETHICONE 80 MG PO CHEW: 80.0000 mg | CHEWABLE_TABLET | Freq: Four times a day (QID) | ORAL | Status: DC | PRN

## 2018-01-29 MED ORDER — BUPIVACAINE LIPOSOME 1.3 % IJ SUSP
INTRAMUSCULAR | Status: DC | PRN
  Administered 2018-01-29: 20 mL

## 2018-01-29 MED ORDER — MIDAZOLAM HCL 2 MG/2ML IJ SOLN
INTRAMUSCULAR | Status: DC | PRN
  Administered 2018-01-29: 2 mg via INTRAVENOUS

## 2018-01-29 MED ORDER — ACETAMINOPHEN 500 MG PO TABS
1000.0000 mg | ORAL_TABLET | ORAL | Status: AC
  Administered 2018-01-29: 1000 mg via ORAL
  Filled 2018-01-29: qty 2

## 2018-01-29 MED ORDER — PREMIER PROTEIN SHAKE
2.0000 [oz_av] | ORAL | Status: DC
  Administered 2018-01-30 (×3): 2 [oz_av] via ORAL

## 2018-01-29 MED ORDER — OXYCODONE HCL 5 MG/5ML PO SOLN: 5.0000 mg | ORAL | Status: DC | PRN

## 2018-01-29 MED ORDER — ONDANSETRON HCL 4 MG/2ML IJ SOLN
INTRAMUSCULAR | Status: DC | PRN
  Administered 2018-01-29: 4 mg via INTRAVENOUS

## 2018-01-29 MED ORDER — PROMETHAZINE HCL 25 MG/ML IJ SOLN
INTRAMUSCULAR | Status: AC
  Filled 2018-01-29: qty 1

## 2018-01-29 MED ORDER — DEXAMETHASONE SODIUM PHOSPHATE 10 MG/ML IJ SOLN
INTRAMUSCULAR | Status: DC | PRN
  Administered 2018-01-29: 4 mg via INTRAVENOUS

## 2018-01-29 MED ORDER — ALBUTEROL SULFATE (2.5 MG/3ML) 0.083% IN NEBU: 3.0000 mL | INHALATION_SOLUTION | Freq: Four times a day (QID) | RESPIRATORY_TRACT | Status: DC | PRN

## 2018-01-29 MED ORDER — FENTANYL CITRATE (PF) 250 MCG/5ML IJ SOLN
INTRAMUSCULAR | Status: DC | PRN
  Administered 2018-01-29: 100 ug via INTRAVENOUS
  Administered 2018-01-29 (×3): 50 ug via INTRAVENOUS

## 2018-01-29 MED ORDER — LACTATED RINGERS IV SOLN
INTRAVENOUS | Status: DC
  Administered 2018-01-29: 10:00:00 via INTRAVENOUS

## 2018-01-29 MED ORDER — HYDRALAZINE HCL 20 MG/ML IJ SOLN: 10.0000 mg | INTRAMUSCULAR | Status: DC | PRN

## 2018-01-29 MED ORDER — FLUOXETINE HCL 20 MG PO TABS
20.0000 mg | ORAL_TABLET | Freq: Every day | ORAL | Status: DC
  Filled 2018-01-29: qty 1

## 2018-01-29 MED ORDER — FENTANYL CITRATE (PF) 100 MCG/2ML IJ SOLN
INTRAMUSCULAR | Status: AC
  Administered 2018-01-29: 50 ug via INTRAVENOUS
  Filled 2018-01-29: qty 2

## 2018-01-29 MED ORDER — PROPOFOL 10 MG/ML IV BOLUS
INTRAVENOUS | Status: AC
  Filled 2018-01-29: qty 20

## 2018-01-29 MED ORDER — ONDANSETRON HCL 4 MG/2ML IJ SOLN
INTRAMUSCULAR | Status: AC
  Filled 2018-01-29: qty 2

## 2018-01-29 MED ORDER — KETAMINE HCL 10 MG/ML IJ SOLN
INTRAMUSCULAR | Status: DC | PRN
  Administered 2018-01-29 (×2): 15 mg via INTRAVENOUS

## 2018-01-29 MED ORDER — DEXAMETHASONE SODIUM PHOSPHATE 4 MG/ML IJ SOLN: 4.0000 mg | INTRAMUSCULAR | Status: DC

## 2018-01-29 MED ORDER — KETOROLAC TROMETHAMINE 30 MG/ML IJ SOLN
30.0000 mg | Freq: Once | INTRAMUSCULAR | Status: AC | PRN
  Administered 2018-01-29: 30 mg via INTRAVENOUS

## 2018-01-29 MED ORDER — GABAPENTIN 300 MG PO CAPS
300.0000 mg | ORAL_CAPSULE | ORAL | Status: AC
  Administered 2018-01-29: 300 mg via ORAL
  Filled 2018-01-29: qty 1

## 2018-01-29 MED ORDER — SUGAMMADEX SODIUM 200 MG/2ML IV SOLN
INTRAVENOUS | Status: DC | PRN
  Administered 2018-01-29: 570 mg via INTRAVENOUS

## 2018-01-29 MED ORDER — LIDOCAINE 2% (20 MG/ML) 5 ML SYRINGE
INTRAMUSCULAR | Status: DC | PRN
  Administered 2018-01-29: 100 mg via INTRAVENOUS

## 2018-01-29 MED ORDER — LACTATED RINGERS IR SOLN
Status: DC | PRN
  Administered 2018-01-29: 1000 mL

## 2018-01-29 MED ORDER — BUPIVACAINE HCL 0.25 % IJ SOLN
INTRAMUSCULAR | Status: DC | PRN
  Administered 2018-01-29: 30 mL

## 2018-01-29 MED ORDER — FENTANYL CITRATE (PF) 100 MCG/2ML IJ SOLN
25.0000 ug | INTRAMUSCULAR | Status: DC | PRN
  Administered 2018-01-29 (×4): 50 ug via INTRAVENOUS

## 2018-01-29 MED ORDER — FENTANYL CITRATE (PF) 100 MCG/2ML IJ SOLN
INTRAMUSCULAR | Status: AC
  Filled 2018-01-29: qty 2

## 2018-01-29 MED ORDER — FENTANYL CITRATE (PF) 250 MCG/5ML IJ SOLN
INTRAMUSCULAR | Status: AC
  Filled 2018-01-29: qty 5

## 2018-01-29 MED ORDER — DEXAMETHASONE SODIUM PHOSPHATE 10 MG/ML IJ SOLN
INTRAMUSCULAR | Status: AC
  Filled 2018-01-29: qty 1

## 2018-01-29 MED ORDER — HEPARIN SODIUM (PORCINE) 5000 UNIT/ML IJ SOLN
5000.0000 [IU] | INTRAMUSCULAR | Status: AC
  Administered 2018-01-29: 5000 [IU] via SUBCUTANEOUS
  Filled 2018-01-29: qty 1

## 2018-01-29 MED ORDER — ROCURONIUM BROMIDE 10 MG/ML (PF) SYRINGE
PREFILLED_SYRINGE | INTRAVENOUS | Status: DC | PRN
  Administered 2018-01-29: 80 mg via INTRAVENOUS

## 2018-01-29 MED ORDER — PROPOFOL 10 MG/ML IV BOLUS
INTRAVENOUS | Status: DC | PRN
  Administered 2018-01-29: 200 mg via INTRAVENOUS

## 2018-01-29 MED ORDER — LIDOCAINE 2% (20 MG/ML) 5 ML SYRINGE
INTRAMUSCULAR | Status: DC | PRN
  Administered 2018-01-29: 1.5 mg/kg/h via INTRAVENOUS

## 2018-01-29 MED ORDER — MIDAZOLAM HCL 2 MG/2ML IJ SOLN
INTRAMUSCULAR | Status: AC
  Filled 2018-01-29: qty 2

## 2018-01-29 MED ORDER — FAMOTIDINE IN NACL 20-0.9 MG/50ML-% IV SOLN
20.0000 mg | Freq: Two times a day (BID) | INTRAVENOUS | Status: DC
  Administered 2018-01-29 – 2018-01-30 (×2): 20 mg via INTRAVENOUS
  Filled 2018-01-29 (×2): qty 50

## 2018-01-29 MED ORDER — ONDANSETRON HCL 4 MG/2ML IJ SOLN: 4.0000 mg | INTRAMUSCULAR | Status: DC | PRN

## 2018-01-29 MED ORDER — BUPIVACAINE HCL (PF) 0.25 % IJ SOLN
INTRAMUSCULAR | Status: AC
  Filled 2018-01-29: qty 30

## 2018-01-29 MED ORDER — PROMETHAZINE HCL 25 MG/ML IJ SOLN: 6.2500 mg | INTRAMUSCULAR | Status: DC | PRN

## 2018-01-29 MED ORDER — DEXTROSE-NACL 5-0.45 % IV SOLN
INTRAVENOUS | Status: DC
  Administered 2018-01-29 – 2018-01-30 (×3): via INTRAVENOUS

## 2018-01-29 MED ORDER — KETOROLAC TROMETHAMINE 30 MG/ML IJ SOLN
INTRAMUSCULAR | Status: AC
  Filled 2018-01-29: qty 1

## 2018-01-29 MED ORDER — 0.9 % SODIUM CHLORIDE (POUR BTL) OPTIME
TOPICAL | Status: DC | PRN
  Administered 2018-01-29: 1000 mL

## 2018-01-29 MED ORDER — GABAPENTIN 100 MG PO CAPS
200.0000 mg | ORAL_CAPSULE | Freq: Two times a day (BID) | ORAL | Status: DC
  Administered 2018-01-30: 200 mg via ORAL
  Filled 2018-01-29 (×2): qty 2

## 2018-01-29 SURGICAL SUPPLY — 50 items
APPLIER CLIP ROT 13.4 12 LRG (CLIP)
BAG LAPAROSCOPIC 12 15 PORT 16 (BASKET) ×1 IMPLANT
BAG RETRIEVAL 12/15 (BASKET) ×2
BANDAGE ADH SHEER 1  50/CT (GAUZE/BANDAGES/DRESSINGS) ×2 IMPLANT
BENZOIN TINCTURE PRP APPL 2/3 (GAUZE/BANDAGES/DRESSINGS) ×2 IMPLANT
BLADE SURG SZ11 CARB STEEL (BLADE) ×2 IMPLANT
CABLE HIGH FREQUENCY MONO STRZ (ELECTRODE) IMPLANT
CHLORAPREP W/TINT 26ML (MISCELLANEOUS) ×2 IMPLANT
CLIP APPLIE ROT 13.4 12 LRG (CLIP) IMPLANT
COVER SURGICAL LIGHT HANDLE (MISCELLANEOUS) ×2 IMPLANT
COVER WAND RF STERILE (DRAPES) IMPLANT
DRAPE UNIVERSAL PACK (DRAPES) ×2 IMPLANT
DRAPE UTILITY XL STRL (DRAPES) ×4 IMPLANT
ELECT REM PT RETURN 15FT ADLT (MISCELLANEOUS) ×2 IMPLANT
GLOVE BIOGEL PI IND STRL 7.0 (GLOVE) ×1 IMPLANT
GLOVE BIOGEL PI INDICATOR 7.0 (GLOVE) ×1
GLOVE SURG SS PI 7.0 STRL IVOR (GLOVE) ×2 IMPLANT
GOWN STRL REUS W/TWL LRG LVL3 (GOWN DISPOSABLE) ×2 IMPLANT
GOWN STRL REUS W/TWL XL LVL3 (GOWN DISPOSABLE) ×6 IMPLANT
GRASPER SUT TROCAR 14GX15 (MISCELLANEOUS) ×2 IMPLANT
HOVERMATT SINGLE USE (MISCELLANEOUS) IMPLANT
KIT BASIN OR (CUSTOM PROCEDURE TRAY) ×2 IMPLANT
MARKER SKIN DUAL TIP RULER LAB (MISCELLANEOUS) ×2 IMPLANT
NEEDLE SPNL 22GX3.5 QUINCKE BK (NEEDLE) ×2 IMPLANT
RELOAD STAPLER BLUE 60MM (STAPLE) ×3 IMPLANT
RELOAD STAPLER GOLD 60MM (STAPLE) ×1 IMPLANT
RELOAD STAPLER GREEN 60MM (STAPLE) ×1 IMPLANT
SCISSORS LAP 5X45 EPIX DISP (ENDOMECHANICALS) IMPLANT
SET IRRIG TUBING LAPAROSCOPIC (IRRIGATION / IRRIGATOR) ×2 IMPLANT
SHEARS HARMONIC ACE PLUS 45CM (MISCELLANEOUS) ×2 IMPLANT
SLEEVE GASTRECTOMY 40FR VISIGI (MISCELLANEOUS) ×2 IMPLANT
SLEEVE XCEL OPT CAN 5 100 (ENDOMECHANICALS) ×4 IMPLANT
SOLUTION ANTI FOG 6CC (MISCELLANEOUS) ×2 IMPLANT
SPONGE LAP 18X18 RF (DISPOSABLE) ×2 IMPLANT
STAPLER ECHELON LONG 60 440 (INSTRUMENTS) ×2 IMPLANT
STAPLER RELOAD BLUE 60MM (STAPLE) ×6
STAPLER RELOAD GOLD 60MM (STAPLE) ×2
STAPLER RELOAD GREEN 60MM (STAPLE) ×2
STRIP CLOSURE SKIN 1/2X4 (GAUZE/BANDAGES/DRESSINGS) ×2 IMPLANT
SUT ETHIBOND 0 36 GRN (SUTURE) IMPLANT
SUT MNCRL AB 4-0 PS2 18 (SUTURE) ×2 IMPLANT
SUT VICRYL 0 TIES 12 18 (SUTURE) ×2 IMPLANT
SYR 20CC LL (SYRINGE) ×2 IMPLANT
SYR 50ML LL SCALE MARK (SYRINGE) ×2 IMPLANT
TOWEL OR 17X26 10 PK STRL BLUE (TOWEL DISPOSABLE) ×2 IMPLANT
TOWEL OR NON WOVEN STRL DISP B (DISPOSABLE) ×2 IMPLANT
TROCAR BLADELESS 15MM (ENDOMECHANICALS) ×2 IMPLANT
TROCAR BLADELESS OPT 5 100 (ENDOMECHANICALS) ×2 IMPLANT
TUBING CONNECTING 10 (TUBING) ×2 IMPLANT
TUBING INSUF HEATED (TUBING) ×2 IMPLANT

## 2018-01-29 NOTE — Op Note (Signed)
Kristen [name] [id number] [birth year] 01/29/2018  Preoperative diagnosis: severe obesity  Postoperative diagnosis: Same   Procedure: upper endoscopy   Surgeon: Leighton Ruff. Tarisha Fader M.D., FACS   Anesthesia: Gen.   Indications for procedure: 52 y.o. year old female undergoing Laparoscopic Gastric Sleeve Resection and an EGD was requested to evaluate the new gastric sleeve.   Description of procedure: After we have completed the sleeve resection, I scrubbed out and obtained the Olympus endoscope. I gently placed endoscope in the patient's oropharynx and gently glided it down the esophagus without any difficulty under direct visualization. Once I was in the gastric sleeve, I insufflated the stomach with air. I was able to cannulate and advanced the scope through the gastric sleeve. I was able to cannulate the duodenum with ease. Dr. Kieth Brightly had placed saline in the upper abdomen. Upon further insufflation of the gastric sleeve there was no evidence of bubbles. GE junction located at 39 cm.  Upon further inspection of the gastric sleeve, the mucosa appeared normal. There is no evidence of any mucosal abnormality. The sleeve was widely patent at the angularis. There was no evidence of bleeding. The gastric sleeve was decompressed. The scope was withdrawn. The patient tolerated this portion of the procedure well. Please see Dr Amie Portland operative note for details regarding the laparoscopic gastric sleeve resection.   Leighton Ruff. Redmond Pulling, MD, FACS  General, Bariatric, & Minimally Invasive Surgery  [facility], [place]

## 2018-01-29 NOTE — Progress Notes (Signed)
Pt started on water, was reported that she was in holding since 1430 so 1830 would be 4 hours.

## 2018-01-29 NOTE — Anesthesia Preprocedure Evaluation (Addendum)
Anesthesia Evaluation  Patient identified by MRN, date of birth, ID band Patient awake    Reviewed: Allergy & Precautions, NPO status , Patient's Chart, lab work & pertinent test results  Airway Mallampati: II  TM Distance: >3 FB Neck ROM: Full    Dental no notable dental hx.    Pulmonary neg pulmonary ROS,    Pulmonary exam normal breath sounds clear to auscultation       Cardiovascular hypertension, Normal cardiovascular exam Rhythm:Regular Rate:Normal     Neuro/Psych negative neurological ROS  negative psych ROS   GI/Hepatic negative GI ROS, Neg liver ROS,   Endo/Other  diabetesMorbid obesity  Renal/GU negative Renal ROS  negative genitourinary   Musculoskeletal negative musculoskeletal ROS (+)   Abdominal   Peds negative pediatric ROS (+)  Hematology negative hematology ROS (+)   Anesthesia Other Findings   Reproductive/Obstetrics negative OB ROS                             Anesthesia Physical Anesthesia Plan  ASA: III  Anesthesia Plan: General   Post-op Pain Management:    Induction: Intravenous  PONV Risk Score and Plan: 4 or greater and Ondansetron, Dexamethasone, Scopolamine patch - Pre-op, Midazolam and Treatment may vary due to age or medical condition  Airway Management Planned: Oral ETT  Additional Equipment:   Intra-op Plan:   Post-operative Plan: Extubation in OR  Informed Consent: I have reviewed the patients History and Physical, chart, labs and discussed the procedure including the risks, benefits and alternatives for the proposed anesthesia with the patient or authorized representative who has indicated his/her understanding and acceptance.   Dental advisory given  Plan Discussed with: CRNA and Surgeon  Anesthesia Plan Comments:         Anesthesia Quick Evaluation

## 2018-01-29 NOTE — Anesthesia Procedure Notes (Signed)
Procedure Name: Intubation Date/Time: 01/29/2018 11:06 AM Performed by: Niel Hummer, CRNA Pre-anesthesia Checklist: Patient identified, Emergency Drugs available, Suction available and Patient being monitored Patient Re-evaluated:Patient Re-evaluated prior to induction Oxygen Delivery Method: Circle system utilized Preoxygenation: Pre-oxygenation with 100% oxygen Induction Type: IV induction Ventilation: Mask ventilation without difficulty and Oral airway inserted - appropriate to patient size Laryngoscope Size: Mac and 4 Grade View: Grade I Tube type: Oral Tube size: 7.0 mm Number of attempts: 1 Airway Equipment and Method: Stylet Placement Confirmation: ETT inserted through vocal cords under direct vision,  positive ETCO2 and breath sounds checked- equal and bilateral Secured at: 22 cm Tube secured with: Tape Dental Injury: Teeth and Oropharynx as per pre-operative assessment

## 2018-01-29 NOTE — Op Note (Signed)
Preop Diagnosis: Obesity Class III  Postop Diagnosis: same  Procedure performed: laparoscopic Sleeve Gastrectomy  Assitant: Greer Pickerel  Indications:  The patient is a 52 y.o. year-old morbidly obese female who has been followed in the Bariatric Clinic as an outpatient. This patient was diagnosed with morbid obesity with a BMI of There is no height or weight on file to calculate BMI. and significant co-morbidities including hypertension and non-insulin dependent diabetes.  The patient was counseled extensively in the Bariatric Outpatient Clinic and after a thorough explanation of the risks and benefits of surgery (including death from complications, bowel leak, infection such as peritonitis and/or sepsis, internal hernia, bleeding, need for blood transfusion, bowel obstruction, organ failure, pulmonary embolus, deep venous thrombosis, wound infection, incisional hernia, skin breakdown, and others entailed on the consent form) and after a compliant diet and exercise program, the patient was scheduled for an elective laparoscopic sleeve gastrectomy.  Description of Operation:  Following informed consent, the patient was taken to the operating room and placed on the operating table in the supine position.  She had previously received prophylactic antibiotics and subcutaneous heparin for DVT prophylaxis in the pre-op holding area.  After induction of general endotracheal anesthesia by the anesthesiologist, the patient underwent placement of sequential compression devices and an oro-gastric tube.  A timeout was confirmed by the surgery and anesthesia teams.  The patient was adequately padded at all pressure points and placed on a footboard to prevent slippage from the OR table during extremes of position during surgery.  She underwent a routine sterile prep and drape of her entire abdomen.    Next, A transverse incision was made under the left subcostal area and a 7mm optical viewing trocar was introduced  into the peritoneal cavity. Pneumoperitoneum was applied with a high flow and low pressure. A laparoscope was inserted to confirm placement. A extraperitoneal block was then placed at the lateral abdominal wall using exparel diluted with marcaine. 5 additional incisions were placed: 1 68mm trocar to the left of the midline. 1 additional 73mm trocar in the left lateral area, 1 14mm trocar in the right mid abdomen, 1 48mm trocar in the right subcostal area, and a Nathanson retractor was placed through a subxiphoid incision.  Due to findings of tiny hiatal hernia on UGI, a calibration tube was used to test the size of the GE junction. The size was found to be normal and there were no signs of hiatal hernia on direct visualization.  The fat pad at the GE junction was incised and the gastrodiaphragmatic ligament was divided using the Harmonic scalpel. Next, a hole was created through the lesser omentum along the greater curve of the stomach to enter the lesser sac. The vessels along the greater omentum were  Then ligated and divided using the Harmonic scalpel moving towards the spleen and then short gastric vessels were ligated and divided in the same fashion to fully mobilize the fundus. The left crus was identified to ensure completion of the dissection. Next the antrum was measured and dissection continued inferiorly along the greater curve towards the pylorus and stopped 6cm from the pylorus.   A 40Fr ViSiGi dilator was placed into the esophgaus and along the lesser curve of the stomach and placed on suction. 1 non-reinforced 39mm Green load echelon stapler(s) followed by 1 62mm Gold load echelon stapler(s) followed by 3 33mm blue load echelon stapler(s) were used to make the resection along the antrum being sure to stay well away from the angularis  by angling the jaws of the stapler towards the greater curve and later completing the resection staying along the Harrison and ensuring the fundus was not retained by  appropriately retracting it lateral. Air was inserted through the [place] to perform a leak test showing no bubbles and a neutral lie of the stomach.  The assistant then went and performed an upper endoscopy and leak test. No bubbles were seen and the sleeve and antrum distended appropriately. The specimen was then placed in an endocatch bag and removed by the 33mm port. The fascia of the 50mm port was closed with a 0 vicryl by suture passer. Hemostasis was ensured. Pneumoperitoneum was evacuated, all ports were removed and all incisions closed with 4-0 monocryl suture in subcuticular fashion. Steristrips and bandaids were put in place for dressing. The patient awoke from anesthesia and was brought to pacu in stable condition. All counts were correct.  Estimated blood loss: <57ml  Specimens:  Sleeve gastrectomy  Local Anesthesia: 50 ml Exparel:0.5% Marcaine mix  Post-Op Plan:       Pain Management: PO, prn      Antibiotics: Prophylactic      Anticoagulation: Prophylactic, Starting now      Post Op Studies/Consults: Not applicable      Intended Discharge: within 48h      Intended Outpatient Follow-Up: Two Week      Intended Outpatient Studies: Not Applicable      Other: Not Applicable  Images:       Arta Bruce Krystena Reitter

## 2018-01-29 NOTE — Discharge Instructions (Signed)
° ° ° °GASTRIC BYPASS/SLEEVE ° Home Care Instructions ° ° These instructions are to help you care for yourself when you go home. ° °Call: If you have any problems. °• Call [phone] and ask for the surgeon on call °• If you need immediate help, come to the ER at [facility].  °• Tell the ER staff that you are a new post-op gastric bypass or gastric sleeve patient °  °Signs and symptoms to report: • Severe vomiting or nausea °o If you cannot keep down clear liquids for longer than 1 day, call your surgeon  °• Abdominal pain that does not get better after taking your pain medication °• Fever over 100.4° F with chills °• Heart beating over 100 beats a minute °• Shortness of breath at rest °• Chest pain °•  Redness, swelling, drainage, or foul odor at incision (surgical) sites °•  If your incisions open or pull apart °• Swelling or pain in calf (lower leg) °• Diarrhea (Loose bowel movements that happen often), frequent watery, uncontrolled bowel movements °• Constipation, (no bowel movements for 3 days) if this happens: Pick one °o Milk of Magnesia, 2 tablespoons by mouth, 3 times a day for 2 days if needed °o Stop taking Milk of Magnesia once you have a bowel movement °o Call your doctor if constipation continues °Or °o Miralax  (instead of Milk of Magnesia) following the label instructions °o Stop taking Miralax once you have a bowel movement °o Call your doctor if constipation continues °• Anything you think is not normal °  °Normal side effects after surgery: • Unable to sleep at night or unable to focus °• Irritability or moody °• Being tearful (crying) or depressed °These are common complaints, possibly related to your anesthesia medications that put you to sleep, stress of surgery, and change in lifestyle.  This usually goes away a few weeks after surgery.  If these feelings continue, call your primary care doctor. °  °Wound Care: You may have surgical glue, steri-strips, or staples over your incisions after  surgery °• Surgical glue:  Looks like a clear film over your incisions and will wear off a little at a time °• Steri-strips: Strips of tape over your incisions. You may notice a yellowish color on the skin under the steri-strips. This is used to make the   steri-strips stick better. Do not pull the steri-strips off - let them fall off °• Staples: Staples may be removed before you leave the hospital °o If you go home with staples, call [facility], [phone] at for an appointment with your surgeon’s nurse to have staples removed 10 days after surgery. °• Showering: You may shower two (2) days after your surgery unless your surgeon tells you differently °o Wash gently around incisions with warm soapy water, rinse well, and gently pat dry  °o No tub baths until staples are removed, steri-strips fall off or glue is gone.  °  °Medications: • Medications should be liquid or crushed if larger than the size of a dime °• Extended release pills (medication that release a little bit at a time through the day) should NOT be crushed or cut. (examples include XL, ER, DR, SR) °• Depending on the size and number of medications you take, you may need to space (take a few throughout the day)/change the time you take your medications so that you do not over-fill your pouch (smaller stomach) °• Make sure you follow-up with your primary care doctor to   make medication changes needed during rapid weight loss and life-style changes °• If you have diabetes, follow up with the doctor that orders your diabetes medication(s) within one week after surgery and check your blood sugar regularly. °• Do not drive while taking prescription pain medication  °• It is ok to take Tylenol by the bottle instructions with your pain medicine or instead of your pain medicine as needed.  DO NOT TAKE NSAIDS (EXAMPLES OF NSAIDS:  IBUPROFREN/ NAPROXEN)  °Diet:                    First 2 Weeks ° You will see the dietician t about two (2) weeks  after your surgery. The dietician will increase the types of foods you can eat if you are handling liquids well: °• If you have severe vomiting or nausea and cannot keep down clear liquids lasting longer than 1 day, call your surgeon @ ([phone]) °Protein Shake °• Drink at least 2 ounces of shake 5-6 times per day °• Each serving of protein shakes (usually 8 - 12 ounces) should have: °o 15 grams of protein  °o And no more than 5 grams of carbohydrate  °• Goal for protein each day: °o Men = 80 grams per day °o Women = 60 grams per day °• Protein powder may be added to fluids such as non-fat milk or Lactaid milk or unsweetened Soy/Almond milk (limit to 35 grams added protein powder per serving) ° °Hydration °• Slowly increase the amount of water and other clear liquids as tolerated (See Acceptable Fluids) °• Slowly increase the amount of protein shake as tolerated  °•  Sip fluids slowly and throughout the day.  Do not use straws. °• May use sugar substitutes in small amounts (no more than 6 - 8 packets per day; i.e. Splenda) ° °Fluid Goal °• The first goal is to drink at least 8 ounces of protein shake/drink per day (or as directed by the nutritionist); some examples of protein shakes are Syntrax Nectar, Adkins Advantage, EAS Edge HP, and Unjury. See handout from pre-op Bariatric Education Class: °o Slowly increase the amount of protein shake you drink as tolerated °o You may find it easier to slowly sip shakes throughout the day °o It is important to get your proteins in first °• Your fluid goal is to drink 64 - 100 ounces of fluid daily °o It may take a few weeks to build up to this °• 32 oz (or more) should be clear liquids  °And  °• 32 oz (or more) should be full liquids (see below for examples) °• Liquids should not contain sugar, caffeine, or carbonation ° °Clear Liquids: °• Water or Sugar-free flavored water (i.e. Fruit H2O, Propel) °• Decaffeinated coffee or tea (sugar-free) °• Crystal Lite, Wyler’s Lite,  Minute Maid Lite °• Sugar-free Jell-O °• Bouillon or broth °• Sugar-free Popsicle:   *Less than 20 calories each; Limit 1 per day ° °Full Liquids: °Protein Shakes/Drinks + 2 choices per day of other full liquids °• Full liquids must be: °o No More Than 15 grams of Carbs per serving  °o No More Than 3 grams of Fat per serving °• Strained low-fat cream soup (except Cream of Potato or Tomato) °• Non-Fat milk °• Fat-free Lactaid Milk °• Unsweetened Soy Or Unsweetened Almond Milk °• Low Sugar yogurt (Dannon Lite & Fit, Greek yogurt; Oikos Triple Zero; Chobani Simply 100; Yoplait 100 calorie Greek - No Fruit on the Bottom) ° °  °Vitamins   and Minerals • Start 1 day after surgery unless otherwise directed by your surgeon °• 2 Chewable Bariatric Specific Multivitamin / Multimineral Supplement with iron (Example: Bariatric Advantage Multi EA) °• Chewable Calcium with Vitamin D-3 °(Example: 3 Chewable Calcium Plus 600 with Vitamin D-3) °o Take 500 mg three (3) times a day for a total of 1500 mg each day °o Do not take all 3 doses of calcium at one time as it may cause constipation, and you can only absorb 500 mg  at a time  °o Do not mix multivitamins containing iron with calcium supplements; take 2 hours apart °• Menstruating women and those with a history of anemia (a blood disease that causes weakness) may need extra iron °o Talk with your doctor to see if you need more iron °• Do not stop taking or change any vitamins or minerals until you talk to your dietitian or surgeon °• Your Dietitian and/or surgeon must approve all vitamin and mineral supplements °  °Activity and Exercise: Limit your physical activity as instructed by your doctor.  It is important to continue walking at home.  During this time, use these guidelines: °• Do not lift anything greater than ten (10) pounds for at least two (2) weeks °• Do not go back to work or drive until your surgeon says you can °• You may have sex when you feel comfortable  °o It is  VERY important for female patients to use a reliable birth control method; fertility often increases after surgery  °o All hormonal birth control will be ineffective for 30 days after surgery due to medications given during surgery a barrier method must be used. °o Do not get pregnant for at least 18 months °• Start exercising as soon as your doctor tells you that you can °o Make sure your doctor approves any physical activity °• Start with a simple walking program °• Walk 5-15 minutes each day, 7 days per week.  °• Slowly increase until you are walking 30-45 minutes per day °Consider joining our BELT program. [phone] or email [email or web address] °  °Special Instructions Things to remember: °• Use your CPAP when sleeping if this applies to you ° °• [facility] has two free Bariatric Surgery Support Groups that meet monthly °o The 3rd Thursday of each month, 6 pm, [facility]  °o The 2nd Friday of each month, 11:45 am in the private dining room in the basement of  °• It is very important to keep all follow up appointments with your surgeon, dietitian, primary care physician, and behavioral health practitioner °• Routine follow up schedule with your surgeon include appointments at 2-3 weeks, 6-8 weeks, 6 months, and 1 year at a minimum.  Your surgeon may request to see you more often.   °o After the first year, please follow up with your bariatric surgeon and dietitian at least once a year in order to maintain best weight loss results °[facility]: [phone] °[facility]: [phone] °Bariatric Nurse Coordinator: [phone] °  °   Reviewed and Endorsed  °by [place] [place] Patient Education Committee, June, 2016 °Edits Approved: Aug, 2018 ° ° ° °

## 2018-01-29 NOTE — Progress Notes (Addendum)
PHARMACY CONSULT FOR:  Risk Assessment for Post-Discharge VTE Following Bariatric Surgery  Post-Discharge VTE Risk Assessment: This patient's probability of 30-day post-discharge VTE is increased due to the factors marked:   Female    Age >/=60 years  x  BMI >/=50 kg/m2    CHF    Dyspnea at Rest    Paraplegia   x Non-gastric-band surgery    Operation Time >/=3 hr    Return to OR     Length of Stay >/= 3 d   Predicted probability of 30-day post-discharge VTE: 0.27%  Other patient-specific factors to consider: - No PMH of PE/DVT   Recommendation for Discharge: No pharmacologic prophylaxis post-discharge      Kristen [name] is a 52 y.o. female who underwent   laparoscopic Sleeve Gastrectomy  on 01/29/2018   Case start: 1128 Case end: 1227   Allergies  Allergen Reactions  . Amoxicillin Hives    Has patient had a PCN reaction causing immediate rash, facial/tongue/throat swelling, SOB or lightheadedness with hypotension: No Has patient had a PCN reaction causing severe rash involving mucus membranes or skin necrosis: Yes Has patient had a PCN reaction that required hospitalization No Has patient had a PCN reaction occurring within the last 10 years: No If all of the above answers are "NO", then may proceed with Cephalosporin use.   . Fish Allergy Hives    Patient Measurements:   There is no height or weight on file to calculate BMI.  Recent Labs    01/29/18 1337  HGB 10.4*  HCT 34.9*   Estimated Creatinine Clearance: 85.7 mL/min (A) (by C-G formula based on SCr of 1.1 mg/dL (H)).    Past Medical History:  Diagnosis Date  . Arthritis   . Diabetes ([place])    Type 2  . Hypertension   . URI (upper respiratory infection)    took antibiotic and prednisone oct 2019 resolved  . Uses contact lenses      Medications Prior to Admission  Medication Sig Dispense Refill Last Dose  . albuterol (PROVENTIL HFA;VENTOLIN HFA) 108 (90 Base) MCG/ACT inhaler Inhale 1-2  puffs into the lungs every 6 (six) hours as needed for wheezing or shortness of breath. 1 Inhaler 2   . aspirin 81 MG chewable tablet Chew 1 tablet (81 mg total) by mouth 2 (two) times daily. (Patient taking differently: Chew 81 mg by mouth every Monday, Wednesday, and Friday. ) 35 tablet 0 Taking  . cetirizine (ZYRTEC) 10 MG tablet Take 10 mg by mouth daily.   01/29/2018 at 0700  . FLUoxetine (PROZAC) 20 MG tablet Take 1 tablet (20 mg total) by mouth daily. 90 tablet 1 01/29/2018 at 0700  . hydrochlorothiazide (HYDRODIURIL) 12.5 MG tablet Take 1 tablet (12.5 mg total) by mouth daily. 90 tablet 1 01/28/2018 at Unknown time  . magnesium oxide (MAG-OX) 400 MG tablet Take 400 mg by mouth 2 (two) times daily.    Past Week at Unknown time  . metFORMIN (GLUCOPHAGE XR) 500 MG 24 hr tablet Take 2 tabs by mouth twice a day 360 tablet 1 01/28/2018 at Unknown time  . Multiple Vitamin (MULTIVITAMIN WITH MINERALS) TABS tablet Take 1 tablet by mouth daily. ONE-A-DAY WOMEN'S 50+   Past Week at Unknown time  . olmesartan-hydrochlorothiazide (BENICAR HCT) 40-25 MG tablet Take 1 tablet by mouth daily. 90 tablet 1 01/28/2018 at Unknown time   Royetta Asal, PharmD, BCPS Pager [phone] 01/29/2018 2:33 PM

## 2018-01-29 NOTE — H&P (Signed)
Kristen [name] is an 52 y.o. female.   Chief Complaint: obesity HPI: 52 yo female with long history of obesity and diabetes and hypertension. She has now completed all requirements and presents for bariatric surgery.  Past Medical History:  Diagnosis Date  . Arthritis   . Diabetes ([place])    Type 2  . Hypertension   . URI (upper respiratory infection)    took antibiotic and prednisone oct 2019 resolved  . Uses contact lenses     Past Surgical History:  Procedure Laterality Date  . COLONOSCOPY WITH PROPOFOL N/A 03/18/2016   Procedure: COLONOSCOPY WITH PROPOFOL;  Surgeon: Carol Ada, MD;  Location: [facility];  Service: Endoscopy;  Laterality: N/A;  . TOTAL HIP ARTHROPLASTY Right 08/04/2017   Procedure: RIGHT TOTAL HIP ARTHROPLASTY ANTERIOR APPROACH;  Surgeon: Mcarthur Rossetti, MD;  Location: [facility];  Service: Orthopedics;  Laterality: Right;    Family History  Problem Relation Age of Onset  . Hypertension Mother   . Diabetes Mother   . Hypertension Father   . Diabetes Father   . COPD Father   . Stroke Father   . Congestive Heart Failure Father    Social History:  reports that she has never smoked. She has never used smokeless tobacco. She reports current alcohol use. She reports that she does not use drugs.  Allergies:  Allergies  Allergen Reactions  . Amoxicillin Hives    Has patient had a PCN reaction causing immediate rash, facial/tongue/throat swelling, SOB or lightheadedness with hypotension: No Has patient had a PCN reaction causing severe rash involving mucus membranes or skin necrosis: Yes Has patient had a PCN reaction that required hospitalization No Has patient had a PCN reaction occurring within the last 10 years: No If all of the above answers are "NO", then may proceed with Cephalosporin use.   . Fish Allergy Hives    Medications Prior to Admission  Medication Sig Dispense Refill  . albuterol (PROVENTIL HFA;VENTOLIN HFA) 108 (90 Base) MCG/ACT  inhaler Inhale 1-2 puffs into the lungs every 6 (six) hours as needed for wheezing or shortness of breath. 1 Inhaler 2  . aspirin 81 MG chewable tablet Chew 1 tablet (81 mg total) by mouth 2 (two) times daily. (Patient taking differently: Chew 81 mg by mouth every Monday, Wednesday, and Friday. ) 35 tablet 0  . cetirizine (ZYRTEC) 10 MG tablet Take 10 mg by mouth daily.    [name] FLUoxetine (PROZAC) 20 MG tablet Take 1 tablet (20 mg total) by mouth daily. 90 tablet 1  . hydrochlorothiazide (HYDRODIURIL) 12.5 MG tablet Take 1 tablet (12.5 mg total) by mouth daily. 90 tablet 1  . magnesium oxide (MAG-OX) 400 MG tablet Take 400 mg by mouth 2 (two) times daily.     . metFORMIN (GLUCOPHAGE XR) 500 MG 24 hr tablet Take 2 tabs by mouth twice a day 360 tablet 1  . Multiple Vitamin (MULTIVITAMIN WITH MINERALS) TABS tablet Take 1 tablet by mouth daily. ONE-A-DAY WOMEN'S 50+    . olmesartan-hydrochlorothiazide (BENICAR HCT) 40-25 MG tablet Take 1 tablet by mouth daily. 90 tablet 1    Results for orders placed or performed during the hospital encounter of 01/29/18 (from the past 48 hour(s))  Glucose, capillary     Status: Abnormal   Collection Time: 01/29/18  8:50 AM  Result Value Ref Range   Glucose-Capillary 295 (H) 70 - 99 mg/dL  Pregnancy, urine STAT morning of surgery     Status: None   Collection  Time: 01/29/18  9:09 AM  Result Value Ref Range   Preg Test, Ur NEGATIVE NEGATIVE    Comment:        THE SENSITIVITY OF THIS METHODOLOGY IS >20 mIU/mL. Performed at [facility], [place] [address] [place], [place] [address]    No results found.  Review of Systems  Constitutional: Negative for chills and fever.  HENT: Negative for hearing loss.   Eyes: Negative for blurred vision and double vision.  Respiratory: Negative for cough and hemoptysis.   Cardiovascular: Negative for chest pain and palpitations.  Gastrointestinal: Negative for abdominal pain, nausea and vomiting.   Genitourinary: Negative for dysuria and urgency.  Musculoskeletal: Negative for myalgias and neck pain.  Skin: Negative for itching and rash.  Neurological: Negative for dizziness, tingling and headaches.  Endo/Heme/Allergies: Does not bruise/bleed easily.  Psychiatric/Behavioral: Negative for depression and suicidal ideas.    Blood pressure 134/86, pulse (!) 110, temperature 98.2 F (36.8 C), temperature source Oral, resp. rate 18, last menstrual period 01/18/2018. Physical Exam  Vitals reviewed. Constitutional: She is oriented to person, place, and time. She appears well-developed and well-nourished.  HENT:  Head: Normocephalic and atraumatic.  Eyes: Pupils are equal, round, and reactive to light. Conjunctivae and EOM are normal.  Neck: Normal range of motion. Neck supple.  Cardiovascular: Normal rate and regular rhythm.  Respiratory: Effort normal and breath sounds normal.  GI: Soft. Bowel sounds are normal. She exhibits no distension. There is no abdominal tenderness.  Musculoskeletal: Normal range of motion.  Neurological: She is alert and oriented to person, place, and time.  Skin: Skin is warm and dry.  Psychiatric: She has a normal mood and affect. Her behavior is normal.     Assessment/Plan 52 yo female with class III obesity -sleeve gastrectomy -bariatric protocol -enhanced recvoery  Mickeal Skinner, MD 01/29/2018, 10:33 AM

## 2018-01-29 NOTE — Anesthesia Postprocedure Evaluation (Signed)
Anesthesia Post Note  Patient: Kristen [name]  Procedure([name]) Performed: LAPAROSCOPIC GASTRIC SLEEVE RESECTION WITH UPPER ENDO AND ERAS PATHWAY (N/A Abdomen)     Patient location during evaluation: PACU Anesthesia Type: General Level of consciousness: awake and alert Pain management: pain level controlled Vital Signs Assessment: post-procedure vital signs reviewed and stable Respiratory status: spontaneous breathing, nonlabored ventilation, respiratory function stable and patient connected to nasal cannula oxygen Cardiovascular status: blood pressure returned to baseline and stable Postop Assessment: no apparent nausea or vomiting Anesthetic complications: no    Last Vitals:  Vitals:   01/29/18 1415 01/29/18 1430  BP: (!) 140/101 (!) 144/97  Pulse: 80 78  Resp: 12 10  Temp:    SpO2: 100% 100%    Last Pain:  Vitals:   01/29/18 1415  TempSrc:   PainSc: 7                  Kristen [name]

## 2018-01-29 NOTE — Progress Notes (Signed)
Patient alert and oriented, walked from PACU stretcher to recliner.  Due to void and needs IS instruction.  Discussed with patient.  Questions answered.

## 2018-01-29 NOTE — Transfer of Care (Signed)
Immediate Anesthesia Transfer of Care Note  Patient: Kristen [name]  Procedure(s) Performed: LAPAROSCOPIC GASTRIC SLEEVE RESECTION WITH UPPER ENDO AND ERAS PATHWAY (N/A Abdomen)  Patient Location: PACU  Anesthesia Type:General  Level of Consciousness: awake, alert  and oriented  Airway & Oxygen Therapy: Patient Spontanous Breathing and Patient connected to face mask oxygen  Post-op Assessment: Report given to RN and Post -op Vital signs reviewed and stable  Post vital signs: Reviewed and stable  Last Vitals:  Vitals Value Taken Time  BP 146/102 01/29/2018 12:45 PM  Temp    Pulse 96 01/29/2018 12:47 PM  Resp 13 01/29/2018 12:47 PM  SpO2 100 % 01/29/2018 12:47 PM  Vitals shown include unvalidated device data.  Last Pain:  Vitals:   01/29/18 0926  TempSrc:   PainSc: 0-No pain         Complications: No apparent anesthesia complications

## 2018-01-30 ENCOUNTER — Encounter (HOSPITAL_COMMUNITY): Payer: Self-pay | Admitting: General Surgery

## 2018-01-30 LAB — COMPREHENSIVE METABOLIC PANEL
ALT: 48 U/L — ABNORMAL HIGH (ref 0–44)
AST: 67 U/L — ABNORMAL HIGH (ref 15–41)
Albumin: 3.3 g/dL — ABNORMAL LOW (ref 3.5–5.0)
Alkaline Phosphatase: 51 U/L (ref 38–126)
Anion gap: 10 (ref 5–15)
BUN: 22 mg/dL — ABNORMAL HIGH (ref 6–20)
CO2: 23 mmol/L (ref 22–32)
Calcium: 8.3 mg/dL — ABNORMAL LOW (ref 8.9–10.3)
Chloride: 105 mmol/L (ref 98–111)
Creatinine, Ser: 1.22 mg/dL — ABNORMAL HIGH (ref 0.44–1.00)
GFR calc Af Amer: 59 mL/min — ABNORMAL LOW (ref >60)
GFR calc non Af Amer: 51 mL/min — ABNORMAL LOW (ref >60)
Glucose, Bld: 191 mg/dL — ABNORMAL HIGH (ref 70–99)
Potassium: 4.6 mmol/L (ref 3.5–5.1)
Sodium: 138 mmol/L (ref 135–145)
Total Bilirubin: 0.7 mg/dL (ref 0.3–1.2)
Total Protein: 6.7 g/dL (ref 6.5–8.1)

## 2018-01-30 LAB — CBC WITH DIFFERENTIAL/PLATELET
Abs Immature Granulocytes: 0.05 10*3/uL (ref 0.00–0.07)
Basophils Absolute: 0 10*3/uL (ref 0.0–0.1)
Basophils Relative: 0 %
Eosinophils Absolute: 0 10*3/uL (ref 0.0–0.5)
Eosinophils Relative: 0 %
HCT: 31.4 % — ABNORMAL LOW (ref 36.0–46.0)
Hemoglobin: 9.2 g/dL — ABNORMAL LOW (ref 12.0–15.0)
Immature Granulocytes: 1 %
Lymphocytes Relative: 16 %
Lymphs Abs: 1.4 10*3/uL (ref 0.7–4.0)
MCH: 25.1 pg — ABNORMAL LOW (ref 26.0–34.0)
MCHC: 29.3 g/dL — ABNORMAL LOW (ref 30.0–36.0)
MCV: 85.8 fL (ref 80.0–100.0)
Monocytes Absolute: 0.6 10*3/uL (ref 0.1–1.0)
Monocytes Relative: 6 %
Neutro Abs: 6.6 10*3/uL (ref 1.7–7.7)
Neutrophils Relative %: 77 %
Platelets: 419 10*3/uL — ABNORMAL HIGH (ref 150–400)
RBC: 3.66 MIL/uL — ABNORMAL LOW (ref 3.87–5.11)
RDW: 17.2 % — ABNORMAL HIGH (ref 11.5–15.5)
WBC: 8.6 10*3/uL (ref 4.0–10.5)
nRBC: 0 % (ref 0.0–0.2)

## 2018-01-30 LAB — GLUCOSE, CAPILLARY
Glucose-Capillary: 174 mg/dL — ABNORMAL HIGH (ref 70–99)
Glucose-Capillary: 116 mg/dL — ABNORMAL HIGH (ref 70–99)

## 2018-01-30 MED ORDER — FLUOXETINE HCL 20 MG PO CAPS
20.0000 mg | ORAL_CAPSULE | Freq: Every day | ORAL | Status: DC
  Administered 2018-01-30: 20 mg via ORAL
  Filled 2018-01-30: qty 1

## 2018-01-30 MED ORDER — GABAPENTIN 300 MG PO CAPS: 300.0000 mg | ORAL_CAPSULE | Freq: Two times a day (BID) | ORAL | 0 refills | Status: DC

## 2018-01-30 MED ORDER — PANTOPRAZOLE SODIUM 40 MG PO TBEC: 40.0000 mg | DELAYED_RELEASE_TABLET | Freq: Every day | ORAL | 0 refills | Status: DC

## 2018-01-30 MED ORDER — ONDANSETRON 4 MG PO TBDP: 4.0000 mg | ORAL_TABLET | Freq: Four times a day (QID) | ORAL | 0 refills | Status: DC | PRN

## 2018-01-30 NOTE — Progress Notes (Signed)
Patient alert and oriented, pain is controlled. Patient is tolerating fluids, advanced to protein shake today, patient is tolerating well.  Reviewed Gastric sleeve discharge instructions with patient and patient is able to articulate understanding.  Provided information on BELT program, Support Group and [facility] outpatient pharmacy. All questions answered, will continue to monitor.  Total fluid intake 960 Per dehydration protocol call back one week post op 

## 2018-01-30 NOTE — Progress Notes (Signed)
Patient educated and instructed about protein shake intake. Patient shows understanding. We will continue to monitor.

## 2018-01-30 NOTE — Progress Notes (Signed)
Inpatient Diabetes Program Recommendations  AACE/ADA: New Consensus Statement on Inpatient Glycemic Control (2015)  Target Ranges:  Prepandial:   less than 140 mg/dL      Peak postprandial:   less than 180 mg/dL (1-2 hours)      Critically ill patients:  140 - 180 mg/dL   Lab Results  Component Value Date   GLUCAP 174 (H) 01/30/2018   HGBA1C 8.7 (H) 01/24/2018    Review of Glycemic Control  Diabetes history: DM2 Outpatient Diabetes medications: metformin 1000 mg bid Current orders for Inpatient glycemic control: Novolog 0-15 units tidwc  HgbA1C - 8.7% - uncontrolled Blood sugars 174-295 mg/dL over past 24H.   Inpatient Diabetes Program Recommendations:     Change Novolog to 0-15 units Q4H or add HS correction to tidwc.   Thank you. Lorenda Peck, RD, LDN, CDE Inpatient Diabetes Coordinator [phone]

## 2018-01-30 NOTE — Discharge Summary (Signed)
Physician Discharge Summary  Kristen [name] XIP:[id number] DOB: 1965/07/20 DOA: 01/29/2018  PCP: Glendale Chard, MD  Admit date: 01/29/2018 Discharge date: 01/30/2018  Recommendations for Outpatient Follow-up:  1.  (include homehealth, outpatient follow-up instructions, specific recommendations for PCP to follow-up on, etc.)  Follow-up Information    Margarita Bobrowski, Arta Bruce, MD. Go on 02/16/2018.   Specialty:  General Surgery Why:  at 10 Contact information: [place] [address] [phone]        Sahil Milner, Arta Bruce, MD .   Specialty:  General Surgery Contact information: [place] [address] [phone]          Discharge Diagnoses:  Active Problems:   Obesity   Surgical Procedure: laparoscopic sleeve gastrectomy, upper endoscopy  Discharge Condition: Good Disposition: Home  Diet recommendation: Postoperative sleeve gastrectomy diet (liquids only)  There were no vitals filed for this visit.   Hospital Course:  The patient was admitted after undergoing laparoscopic sleeve gastrectomy. POD 0 she ambulated well. POD 1 she was started on the water diet protocol and tolerated 600 ml in the first shift. Once meeting the water amount she was advanced to bariatric protein shakes which they tolerated and were discharged home POD 1.  Treatments: surgery: laparoscopic sleeve gastrectomy  Discharge Instructions  Discharge Instructions    Ambulate hourly while awake   Complete by:  As directed    Call MD for:  difficulty breathing, headache or visual disturbances   Complete by:  As directed    Call MD for:  persistant dizziness or light-headedness   Complete by:  As directed    Call MD for:  persistant nausea and vomiting   Complete by:  As directed    Call MD for:  redness, tenderness, or signs of infection (pain, swelling, redness, odor or green/yellow discharge around incision site)   Complete by:  As  directed    Call MD for:  severe uncontrolled pain   Complete by:  As directed    Call MD for:  temperature >101 F   Complete by:  As directed    Diet bariatric full liquid   Complete by:  As directed    Discharge wound care:   Complete by:  As directed    Remove Bandaids tomorrow, ok to shower tomorrow. Steristrips may fall off in 1-3 weeks.   Incentive spirometry   Complete by:  As directed    Perform hourly while awake     Allergies as of 01/30/2018      Reactions   Amoxicillin Hives   Has patient had a PCN reaction causing immediate rash, facial/tongue/throat swelling, SOB or lightheadedness with hypotension: No Has patient had a PCN reaction causing severe rash involving mucus membranes or skin necrosis: Yes Has patient had a PCN reaction that required hospitalization No Has patient had a PCN reaction occurring within the last 10 years: No If all of the above answers are "NO", then may proceed with Cephalosporin use.   Fish Allergy Hives      Medication List    STOP taking these medications   hydrochlorothiazide 12.5 MG tablet Commonly known as:  HYDRODIURIL     TAKE these medications   albuterol 108 (90 Base) MCG/ACT inhaler Commonly known as:  PROVENTIL HFA;VENTOLIN HFA Inhale 1-2 puffs into the lungs every 6 (six) hours as needed for wheezing or shortness of breath.   aspirin 81 MG chewable tablet Chew 1 tablet (81 mg total)  by mouth 2 (two) times daily. What changed:  when to take this   cetirizine 10 MG tablet Commonly known as:  ZYRTEC Take 10 mg by mouth daily.   FLUoxetine 20 MG tablet Commonly known as:  PROZAC Take 1 tablet (20 mg total) by mouth daily.   gabapentin 300 MG capsule Commonly known as:  NEURONTIN Take 1 capsule (300 mg total) by mouth 2 (two) times daily.   magnesium oxide 400 MG tablet Commonly known as:  MAG-OX Take 400 mg by mouth 2 (two) times daily.   metFORMIN 500 MG 24 hr tablet Commonly known as:  GLUCOPHAGE XR Take 2  tabs by mouth twice a day Notes to patient:  Monitor Blood Sugar Frequently and keep a log for primary care physician, you may need to adjust medication dosage with rapid weight loss.     multivitamin with minerals Tabs tablet Take 1 tablet by mouth daily. ONE-A-DAY WOMEN'S 50+   olmesartan-hydrochlorothiazide 40-25 MG tablet Commonly known as:  BENICAR HCT Take 1 tablet by mouth daily. Notes to patient:  Monitor Blood Pressure Daily and keep a log for primary care physician.  You may need to make changes to your medications with rapid weight loss.     ondansetron 4 MG disintegrating tablet Commonly known as:  ZOFRAN-ODT Take 1 tablet (4 mg total) by mouth every 6 (six) hours as needed for nausea or vomiting.   pantoprazole 40 MG tablet Commonly known as:  PROTONIX Take 1 tablet (40 mg total) by mouth daily.            Discharge Care Instructions  (From admission, onward)         Start     Ordered   01/30/18 0000  Discharge wound care:    Comments:  Remove Bandaids tomorrow, ok to shower tomorrow. Steristrips may fall off in 1-3 weeks.   01/30/18 1021         Follow-up Information    Indalecio Malmstrom, Arta Bruce, MD. Go on 02/16/2018.   Specialty:  General Surgery Why:  at 10 Contact information: [place] [address] [phone]        Roneshia Drew, Arta Bruce, MD .   Specialty:  General Surgery Contact information: [place] [address] [phone]            The results of significant diagnostics from this hospitalization (including imaging, microbiology, ancillary and laboratory) are listed below for reference.    Significant Diagnostic Studies: No results found.  Labs: Basic Metabolic Panel: Recent Labs  Lab 01/24/18 1629 01/30/18 0404  NA 140 138  K 4.1 4.6  CL 104 105  CO2 29 23  GLUCOSE 208* 191*  BUN 15 22*  CREATININE 1.10* 1.22*  CALCIUM 9.3 8.3*   Liver Function Tests: Recent Labs   Lab 01/24/18 1629 01/30/18 0404  AST 34 67*  ALT 16 48*  ALKPHOS 76 51  BILITOT 0.9 0.7  PROT 7.9 6.7  ALBUMIN 3.8 3.3*    CBC: Recent Labs  Lab 01/24/18 1629 01/29/18 1337 01/30/18 0404  WBC 8.1  --  8.6  NEUTROABS 4.8  --  6.6  HGB 10.4* 10.4* 9.2*  HCT 34.7* 34.9* 31.4*  MCV 82.2  --  85.8  PLT 524*  --  419*    CBG: Recent Labs  Lab 01/29/18 0850 01/29/18 1250 01/29/18 1753 01/29/18 2134 01/30/18 0739  GLUCAP 295* 188* 287* 275* 174*    Active  Problems:   Obesity   VTE plan: no chemical prophylaxis recommended ([email or web address])  Time coordinating discharge: 69min

## 2018-01-30 NOTE — Progress Notes (Signed)
Patient alert and oriented, Post op day 1.  Provided support and encouragement.  Encouraged pulmonary toilet, ambulation and small sips of liquids. Completed 12 ounces of clear liquid, completed 6 ounces protein All questions answered.  Will continue to monitor.

## 2018-01-30 NOTE — Progress Notes (Signed)
Pt given copy of discharge summary.  Alert and oriented, vss. Pt d/cd to home.

## 2018-01-31 ENCOUNTER — Encounter: Payer: BLUE CROSS/BLUE SHIELD | Admitting: Internal Medicine

## 2018-02-05 ENCOUNTER — Encounter: Payer: Self-pay | Admitting: Nurse Practitioner

## 2018-02-05 ENCOUNTER — Telehealth (HOSPITAL_COMMUNITY): Payer: Self-pay

## 2018-02-05 ENCOUNTER — Ambulatory Visit (INDEPENDENT_AMBULATORY_CARE_PROVIDER_SITE_OTHER): Payer: BLUE CROSS/BLUE SHIELD | Admitting: Nurse Practitioner

## 2018-02-05 VITALS — BP 102/70 | HR 101 | Temp 98.0°F | Ht 64.25 in | Wt 303.8 lb

## 2018-02-05 DIAGNOSIS — Z6841 Body Mass Index (BMI) 40.0 and over, adult: Secondary | ICD-10-CM | POA: Diagnosis not present

## 2018-02-05 DIAGNOSIS — E119 Type 2 diabetes mellitus without complications: Secondary | ICD-10-CM

## 2018-02-05 DIAGNOSIS — E1122 Type 2 diabetes mellitus with diabetic chronic kidney disease: Secondary | ICD-10-CM | POA: Insufficient documentation

## 2018-02-05 DIAGNOSIS — I10 Essential (primary) hypertension: Secondary | ICD-10-CM | POA: Diagnosis not present

## 2018-02-05 DIAGNOSIS — E1169 Type 2 diabetes mellitus with other specified complication: Secondary | ICD-10-CM | POA: Insufficient documentation

## 2018-02-05 DIAGNOSIS — Z9884 Bariatric surgery status: Secondary | ICD-10-CM

## 2018-02-05 DIAGNOSIS — Z903 Acquired absence of stomach [part of]: Secondary | ICD-10-CM

## 2018-02-05 MED ORDER — BLOOD GLUCOSE MONITOR KIT: PACK | 0 refills | Status: DC

## 2018-02-05 NOTE — Telephone Encounter (Signed)
Patient called to discuss post bariatric surgery follow up questions.  See below:   1.  Tell me about your pain and pain management?denies slight cramping at times with fluid intake does  Not require medicaiton  2.  Let's talk about fluid intake.  How much total fluid are you taking in?50-54 ounces  3.  How much protein have you taken in the last 2 days?60  4.  Have you had nausea?  Tell me about when have experienced nausea and what you did to help?denies  5.  Has the frequency or color changed with your urine?light color denies headache or dizziness  6.  Tell me what your incisions look like?no problems  7.  Have you been passing gas? BM?passing gas had bm  8.  If a problem or question were to arise who would you call?  Do you know contact numbers for [facility], [facility], and [facility]?aware of how to contact all services  9.  How has the walking going?walking frequently  10.  How are your vitamins and calcium going?  How are you taking them?MVI and calcium without difficulty

## 2018-02-05 NOTE — Progress Notes (Signed)
Subjective:     Patient ID: Lance Muss , female    DOB: Aug 26, 1965 , 52 y.o.   MRN: [id number]   Chief Complaint  Patient presents with  . Hypertension  . Diabetes    HPI  She had her bariatric surgery done 01/29/18 - she is now drinking shakes 2 times per day and 64 oz of clear liquids or full liquids.  She will follow up with RD a week from today and follows up with the provider January 2019.  She has not been taking her HCTZ  Due to risk of dehydration and she stopped the metformin on her own.  No pain, she had a BM last PM.    Hypertension  This is a chronic problem. The current episode started more than 1 year ago. The problem has been gradually improving since onset. The problem is controlled. Pertinent negatives include no anxiety or palpitations. There are no known risk factors for coronary artery disease. Past treatments include nothing. The current treatment provides no improvement. There are no compliance problems.  There is no history of angina. There is no history of chronic renal disease.  Diabetes      Past Medical History:  Diagnosis Date  . Arthritis   . Diabetes ([place])    Type 2  . Hypertension   . URI (upper respiratory infection)    took antibiotic and prednisone oct 2019 resolved  . Uses contact lenses      Family History  Problem Relation Age of Onset  . Hypertension Mother   . Diabetes Mother   . Hypertension Father   . Diabetes Father   . COPD Father   . Stroke Father   . Congestive Heart Failure Father      Current Outpatient Medications:  .  albuterol (PROVENTIL HFA;VENTOLIN HFA) 108 (90 Base) MCG/ACT inhaler, Inhale 1-2 puffs into the lungs every 6 (six) hours as needed for wheezing or shortness of breath., Disp: 1 Inhaler, Rfl: 2 .  aspirin 81 MG chewable tablet, Chew 1 tablet (81 mg total) by mouth 2 (two) times daily. (Patient taking differently: Chew 81 mg by mouth every Monday, Wednesday, and Friday. ), Disp: 35 tablet, Rfl: 0 .   cetirizine (ZYRTEC) 10 MG tablet, Take 10 mg by mouth daily., Disp: , Rfl:  .  FLUoxetine (PROZAC) 20 MG tablet, Take 1 tablet (20 mg total) by mouth daily., Disp: 90 tablet, Rfl: 1 .  gabapentin (NEURONTIN) 300 MG capsule, Take 1 capsule (300 mg total) by mouth 2 (two) times daily., Disp: 40 capsule, Rfl: 0 .  Multiple Vitamin (MULTIVITAMIN WITH MINERALS) TABS tablet, Take 1 tablet by mouth 2 (two) times daily. ONE-A-DAY WOMEN'S 50+ , Disp: , Rfl:  .  olmesartan-hydrochlorothiazide (BENICAR HCT) 40-25 MG tablet, Take 1 tablet by mouth daily., Disp: 90 tablet, Rfl: 1 .  pantoprazole (PROTONIX) 40 MG tablet, Take 1 tablet (40 mg total) by mouth daily., Disp: 90 tablet, Rfl: 0 .  magnesium oxide (MAG-OX) 400 MG tablet, Take 400 mg by mouth 2 (two) times daily. , Disp: , Rfl:  .  metFORMIN (GLUCOPHAGE XR) 500 MG 24 hr tablet, Take 2 tabs by mouth twice a day (Patient not taking: Reported on 02/05/2018), Disp: 360 tablet, Rfl: 1 .  ondansetron (ZOFRAN-ODT) 4 MG disintegrating tablet, Take 1 tablet (4 mg total) by mouth every 6 (six) hours as needed for nausea or vomiting. (Patient not taking: Reported on 02/05/2018), Disp: 20 tablet, Rfl: 0   Allergies  Allergen  Reactions  . Amoxicillin Hives    Has patient had a PCN reaction causing immediate rash, facial/tongue/throat swelling, SOB or lightheadedness with hypotension: No Has patient had a PCN reaction causing severe rash involving mucus membranes or skin necrosis: Yes Has patient had a PCN reaction that required hospitalization No Has patient had a PCN reaction occurring within the last 10 years: No If all of the above answers are "NO", then may proceed with Cephalosporin use.   . Fish Allergy Hives     Review of Systems  Cardiovascular: Negative for palpitations.     Today's Vitals   02/05/18 1106  BP: 102/70  Pulse: (!) 101  Temp: 98 F (36.7 C)  TempSrc: Oral  SpO2: 92%  Weight: (!) 303 lb 12.8 oz (137.8 kg)  Height: 5' 4.25"  (1.632 m)  PainSc: 0-No pain   Body mass index is 51.74 kg/m.   Objective:  Physical Exam Vitals signs reviewed.  Constitutional:      Appearance: She is well-developed.  HENT:     Mouth/Throat:     Mouth: Mucous membranes are moist.  Eyes:     Pupils: Pupils are equal, round, and reactive to light.  Neck:     Musculoskeletal: Normal range of motion and neck supple.  Cardiovascular:     Rate and Rhythm: Normal rate and regular rhythm.     Heart sounds: Normal heart sounds. No murmur.  Pulmonary:     Effort: Pulmonary effort is normal.     Breath sounds: Normal breath sounds.  Chest:     Chest wall: No tenderness.  Musculoskeletal: Normal range of motion.  Skin:    General: Skin is warm and dry.     Capillary Refill: Capillary refill takes less than 2 seconds.  Neurological:     Mental Status: She is alert and oriented to person, place, and time.     Cranial Nerves: No cranial nerve deficit.          Assessment And Plan:     1. Type 2 diabetes mellitus without complication, without long-term current use of insulin (HCC)  Chronic, controlled  Continue with current medications  Encouraged to limit intake of sugary foods and drinks  Encouraged to increase physical activity to 150 minutes per week  2. Essential hypertension . B/P is elevated today.  . I will not make any changes to her medications at this time encouraged to continue to avoid high salts and drink adequate amounts of water.  [name] She will be follow up with the obesity management team in 2 weeks. . The importance of regular exercise and dietary modification was stressed to the patient.  . Stressed importance of losing ten percent of her body weight to help with B/P control.  . The weight loss would help with decreasing cardiac and cancer risk as well.    3. Status post gastrectomy  Reports every thing went well and she is doing well with her shakes  Continue follow up with obesity management  4.  Morbid obesity (HCC)  Chronic  Discussed healthy diet and regular exercise options   Encouraged to exercise at least 150 minutes per week with 2 days of strength training once cleared  5. BMI 50.0-59.9, adult ([place])   [name], FNP

## 2018-02-12 ENCOUNTER — Encounter: Payer: Self-pay | Admitting: Dietician

## 2018-02-12 ENCOUNTER — Encounter: Payer: BLUE CROSS/BLUE SHIELD | Attending: General Surgery | Admitting: Dietician

## 2018-02-12 VITALS — Ht 65.0 in | Wt 297.3 lb

## 2018-02-12 DIAGNOSIS — Z6841 Body Mass Index (BMI) 40.0 and over, adult: Secondary | ICD-10-CM | POA: Diagnosis not present

## 2018-02-12 DIAGNOSIS — Z713 Dietary counseling and surveillance: Secondary | ICD-10-CM | POA: Insufficient documentation

## 2018-02-12 NOTE — Progress Notes (Signed)
Follow-up visit:  2 Weeks Post-Operative Sleeve Gastrectomy Surgery  Medical Nutrition Therapy:  Appt start time: 1100 end time:  1150.  Primary concerns today: Post-operative Bariatric Surgery Nutrition Management.  Preferred Learning Style:   Auditory  Visual  Hands on   Learning Readiness:   Change in progress  Weight: 297.3lbs Height: 5'5" Weight at previous [facility] visit: 316.5lbs  Progress: Weight loss of 19.2lbs since surgery. Patient denies any adverse issues since surgery. She reports increasing fluid intake has been some challenge, but is close to or at the goal of 64oz at this time. She feels ready to begin eating some solid foods.      Dietary recall: Breakfast: Premier protein shake (1/2) Snack: chicken broth, sugar free jello, or sugar free popsicle.   Lunch: protein shake  Snack: chicken broth, sf jello, sf popsicle, or Light & Fit Greek yogurt  Dinner: 1/2 protein shake Snack: 1/2 protein shake  Fluid intake: 22oz occ decaf coffee, water, sipping frequently from 16oz bottle 2x daily = 54-64oz Estimated total protein intake: 70-73g  Medications: albuterol inhaler as needed, 81mg  aspirin 3x a week, cetirizine, FLUoxetine, gabapentin, metFORMIN, olmesartan-hydrochlorothiazide, pantoprazole Supplementation: bariatric multivitamin2x daily + calcium 3x daily  Using straws: none Drinking while eating: n/a Hair loss: has alopecia Carbonated beverages: no N/V/D/C: no Dumping syndrome: no   Recent physical activity:  None yet  Progress Towards Goal(s):  In progress.  Handouts given during visit include:  Phase 3 and Phase 4 bariatric diet handouts  Goals and Instructions   Nutritional Diagnosis:  [place]-3.3 Overweight/obesity As related to history of excess calories and inactivity.  As evidenced by patient with current BMI of 49.3, currently following bariatric diet after sleeve gastrectomy surgery.    Intervention:    Patient experienced brief episode of  wheezing soon after weighing; she did not have her inhaler with her, but symptoms subsided in moments. She is unsure of trigger, and states she has not needed to use her inhaler for several weeks.  Instructed patient on phase 3 bariatric diet, inclusion of solid protein foods, with gradual increase in portions as tolerated, and decrease in liquid proteins.   Discussed phase 4 bariatric diet, adding non-starchy vegetables only when able to eat adequate protein amounts.   Teaching Method Utilized:  Visual Auditory Hands on  Barriers to learning/adherence to lifestyle change: none  Demonstrated degree of understanding via:  Teach Back   Monitoring/Evaluation:  Dietary intake, exercise, and body weight. Follow up in 6-7 weeks for 2 month post-op visit.

## 2018-02-12 NOTE — Patient Instructions (Signed)
   Begin adding some moist, soft protein foods, starting with 1oz or 1/4 cup, and gradually increasing as tolerated.   Gradually reduce protein shakes as solid protein intake increases, but maintain at least 60grams of protein intake daily.   Add some light physical activity as cleared by MD.

## 2018-02-13 ENCOUNTER — Inpatient Hospital Stay
Admit: 2018-02-13 | Discharge: 2018-02-13 | Disposition: A | Discharge status: Home / Self Care | Payer: BLUE CROSS/BLUE SHIELD | Attending: Internal Medicine | Admitting: Internal Medicine

## 2018-02-13 ENCOUNTER — Inpatient Hospital Stay: Payer: BLUE CROSS/BLUE SHIELD

## 2018-02-13 ENCOUNTER — Encounter: Payer: Self-pay | Admitting: Nurse Practitioner

## 2018-02-13 ENCOUNTER — Other Ambulatory Visit: Payer: Self-pay

## 2018-02-13 ENCOUNTER — Encounter: Payer: Self-pay | Admitting: *Deleted

## 2018-02-13 ENCOUNTER — Inpatient Hospital Stay
Admission: EM | Admit: 2018-02-13 | Discharge: 2018-02-19 | DRG: 280 | Disposition: A | Discharge status: Home Health | Payer: BLUE CROSS/BLUE SHIELD | Attending: Internal Medicine | Admitting: Internal Medicine

## 2018-02-13 ENCOUNTER — Ambulatory Visit (HOSPITAL_COMMUNITY): Payer: BLUE CROSS/BLUE SHIELD

## 2018-02-13 DIAGNOSIS — D473 Essential (hemorrhagic) thrombocythemia: Secondary | ICD-10-CM

## 2018-02-13 DIAGNOSIS — Z6841 Body Mass Index (BMI) 40.0 and over, adult: Secondary | ICD-10-CM

## 2018-02-13 DIAGNOSIS — I214 Non-ST elevation (NSTEMI) myocardial infarction: Secondary | ICD-10-CM | POA: Diagnosis present

## 2018-02-13 DIAGNOSIS — J9601 Acute respiratory failure with hypoxia: Secondary | ICD-10-CM | POA: Diagnosis present

## 2018-02-13 DIAGNOSIS — N183 Chronic kidney disease, stage 3 (moderate): Secondary | ICD-10-CM | POA: Diagnosis present

## 2018-02-13 DIAGNOSIS — Z96641 Presence of right artificial hip joint: Secondary | ICD-10-CM | POA: Diagnosis present

## 2018-02-13 DIAGNOSIS — I272 Pulmonary hypertension, unspecified: Secondary | ICD-10-CM | POA: Diagnosis present

## 2018-02-13 DIAGNOSIS — R778 Other specified abnormalities of plasma proteins: Secondary | ICD-10-CM

## 2018-02-13 DIAGNOSIS — I422 Other hypertrophic cardiomyopathy: Secondary | ICD-10-CM | POA: Diagnosis present

## 2018-02-13 DIAGNOSIS — Z823 Family history of stroke: Secondary | ICD-10-CM | POA: Diagnosis not present

## 2018-02-13 DIAGNOSIS — E1165 Type 2 diabetes mellitus with hyperglycemia: Secondary | ICD-10-CM | POA: Diagnosis present

## 2018-02-13 DIAGNOSIS — D638 Anemia in other chronic diseases classified elsewhere: Secondary | ICD-10-CM | POA: Diagnosis present

## 2018-02-13 DIAGNOSIS — Z9884 Bariatric surgery status: Secondary | ICD-10-CM | POA: Diagnosis not present

## 2018-02-13 DIAGNOSIS — I444 Left anterior fascicular block: Secondary | ICD-10-CM | POA: Diagnosis present

## 2018-02-13 DIAGNOSIS — R9431 Abnormal electrocardiogram [ECG] [EKG]: Secondary | ICD-10-CM

## 2018-02-13 DIAGNOSIS — E860 Dehydration: Secondary | ICD-10-CM | POA: Diagnosis present

## 2018-02-13 DIAGNOSIS — E1122 Type 2 diabetes mellitus with diabetic chronic kidney disease: Secondary | ICD-10-CM | POA: Diagnosis present

## 2018-02-13 DIAGNOSIS — I82409 Acute embolism and thrombosis of unspecified deep veins of unspecified lower extremity: Secondary | ICD-10-CM

## 2018-02-13 DIAGNOSIS — Z973 Presence of spectacles and contact lenses: Secondary | ICD-10-CM

## 2018-02-13 DIAGNOSIS — I2699 Other pulmonary embolism without acute cor pulmonale: Secondary | ICD-10-CM | POA: Diagnosis present

## 2018-02-13 DIAGNOSIS — R7989 Other specified abnormal findings of blood chemistry: Secondary | ICD-10-CM

## 2018-02-13 DIAGNOSIS — N179 Acute kidney failure, unspecified: Secondary | ICD-10-CM | POA: Diagnosis present

## 2018-02-13 DIAGNOSIS — I129 Hypertensive chronic kidney disease with stage 1 through stage 4 chronic kidney disease, or unspecified chronic kidney disease: Secondary | ICD-10-CM | POA: Diagnosis present

## 2018-02-13 DIAGNOSIS — M1990 Unspecified osteoarthritis, unspecified site: Secondary | ICD-10-CM | POA: Diagnosis present

## 2018-02-13 DIAGNOSIS — Z7984 Long term (current) use of oral hypoglycemic drugs: Secondary | ICD-10-CM | POA: Diagnosis not present

## 2018-02-13 DIAGNOSIS — J811 Chronic pulmonary edema: Secondary | ICD-10-CM | POA: Diagnosis present

## 2018-02-13 DIAGNOSIS — E782 Mixed hyperlipidemia: Secondary | ICD-10-CM | POA: Diagnosis present

## 2018-02-13 DIAGNOSIS — D509 Iron deficiency anemia, unspecified: Secondary | ICD-10-CM

## 2018-02-13 DIAGNOSIS — Z8249 Family history of ischemic heart disease and other diseases of the circulatory system: Secondary | ICD-10-CM

## 2018-02-13 DIAGNOSIS — D75839 Thrombocytosis, unspecified: Secondary | ICD-10-CM

## 2018-02-13 DIAGNOSIS — Z825 Family history of asthma and other chronic lower respiratory diseases: Secondary | ICD-10-CM | POA: Diagnosis not present

## 2018-02-13 DIAGNOSIS — Z7982 Long term (current) use of aspirin: Secondary | ICD-10-CM

## 2018-02-13 DIAGNOSIS — D649 Anemia, unspecified: Secondary | ICD-10-CM

## 2018-02-13 DIAGNOSIS — Z833 Family history of diabetes mellitus: Secondary | ICD-10-CM | POA: Diagnosis not present

## 2018-02-13 DIAGNOSIS — R55 Syncope and collapse: Secondary | ICD-10-CM

## 2018-02-13 DIAGNOSIS — R809 Proteinuria, unspecified: Secondary | ICD-10-CM | POA: Diagnosis present

## 2018-02-13 LAB — GLUCOSE, CAPILLARY
Glucose-Capillary: 145 mg/dL — ABNORMAL HIGH (ref 70–99)
Glucose-Capillary: 168 mg/dL — ABNORMAL HIGH (ref 70–99)

## 2018-02-13 LAB — BASIC METABOLIC PANEL
Anion gap: 15 (ref 5–15)
Anion gap: 9 (ref 5–15)
BUN: 39 mg/dL — ABNORMAL HIGH (ref 6–20)
BUN: 39 mg/dL — ABNORMAL HIGH (ref 6–20)
CO2: 18 mmol/L — ABNORMAL LOW (ref 22–32)
CO2: 20 mmol/L — ABNORMAL LOW (ref 22–32)
Calcium: 9 mg/dL (ref 8.9–10.3)
Calcium: 8.8 mg/dL — ABNORMAL LOW (ref 8.9–10.3)
Chloride: 104 mmol/L (ref 98–111)
Chloride: 108 mmol/L (ref 98–111)
Creatinine, Ser: 2.09 mg/dL — ABNORMAL HIGH (ref 0.44–1.00)
Creatinine, Ser: 1.93 mg/dL — ABNORMAL HIGH (ref 0.44–1.00)
GFR calc Af Amer: 31 mL/min — ABNORMAL LOW (ref >60)
GFR calc Af Amer: 34 mL/min — ABNORMAL LOW (ref >60)
GFR calc non Af Amer: 27 mL/min — ABNORMAL LOW (ref >60)
GFR calc non Af Amer: 29 mL/min — ABNORMAL LOW (ref >60)
Glucose, Bld: 265 mg/dL — ABNORMAL HIGH (ref 70–99)
Glucose, Bld: 201 mg/dL — ABNORMAL HIGH (ref 70–99)
Potassium: 3.7 mmol/L (ref 3.5–5.1)
Potassium: 4.6 mmol/L (ref 3.5–5.1)
Sodium: 137 mmol/L (ref 135–145)
Sodium: 137 mmol/L (ref 135–145)

## 2018-02-13 LAB — LIPID PANEL
Cholesterol: 98 mg/dL (ref 0–200)
HDL: 30 mg/dL — ABNORMAL LOW (ref >40)
LDL Cholesterol: 54 mg/dL (ref 0–99)
Total CHOL/HDL Ratio: 3.3 RATIO
Triglycerides: 71 mg/dL (ref <150)
VLDL: 14 mg/dL (ref 0–40)

## 2018-02-13 LAB — HEPARIN LEVEL (UNFRACTIONATED)
Heparin Unfractionated: <0.1 IU/mL — ABNORMAL LOW (ref 0.30–0.70)
Heparin Unfractionated: <0.1 IU/mL — ABNORMAL LOW (ref 0.30–0.70)

## 2018-02-13 LAB — CBC
HCT: 35.7 % — ABNORMAL LOW (ref 36.0–46.0)
Hemoglobin: 11 g/dL — ABNORMAL LOW (ref 12.0–15.0)
MCH: 25.8 pg — ABNORMAL LOW (ref 26.0–34.0)
MCHC: 30.8 g/dL (ref 30.0–36.0)
MCV: 83.8 fL (ref 80.0–100.0)
Platelets: 468 10*3/uL — ABNORMAL HIGH (ref 150–400)
RBC: 4.26 MIL/uL (ref 3.87–5.11)
RDW: 18.2 % — ABNORMAL HIGH (ref 11.5–15.5)
WBC: 12.8 10*3/uL — ABNORMAL HIGH (ref 4.0–10.5)
nRBC: 0 % (ref 0.0–0.2)

## 2018-02-13 LAB — TROPONIN I
Troponin I: 0.89 ng/mL (ref <0.03)
Troponin I: 1.28 ng/mL (ref <0.03)
Troponin I: 1.51 ng/mL (ref <0.03)
Troponin I: 1.82 ng/mL (ref <0.03)
Troponin I: 1.72 ng/mL (ref <0.03)

## 2018-02-13 LAB — PROTIME-INR
INR: 1.15
Prothrombin Time: 14.6 seconds (ref 11.4–15.2)

## 2018-02-13 LAB — APTT: aPTT: 33 seconds (ref 24–36)

## 2018-02-13 LAB — MAGNESIUM
Magnesium: 2.1 mg/dL (ref 1.7–2.4)
Magnesium: 2.3 mg/dL (ref 1.7–2.4)

## 2018-02-13 LAB — TSH: TSH: 1.611 u[IU]/mL (ref 0.350–4.500)

## 2018-02-13 LAB — FIBRIN DERIVATIVES D-DIMER (ARMC ONLY): Fibrin derivatives D-dimer (ARMC): 4575.57 ng/mL (FEU) — ABNORMAL HIGH (ref 0.00–499.00)

## 2018-02-13 MED ORDER — ALBUTEROL SULFATE (2.5 MG/3ML) 0.083% IN NEBU: 2.5000 mg | INHALATION_SOLUTION | RESPIRATORY_TRACT | Status: DC | PRN

## 2018-02-13 MED ORDER — IRBESARTAN 150 MG PO TABS
300.0000 mg | ORAL_TABLET | Freq: Every day | ORAL | Status: DC
  Administered 2018-02-13: 300 mg via ORAL
  Filled 2018-02-13 (×2): qty 2

## 2018-02-13 MED ORDER — INSULIN ASPART 100 UNIT/ML ~~LOC~~ SOLN
0.0000 [IU] | Freq: Three times a day (TID) | SUBCUTANEOUS | Status: DC
  Administered 2018-02-14 (×2): 2 [IU] via SUBCUTANEOUS
  Administered 2018-02-14: 3 [IU] via SUBCUTANEOUS
  Administered 2018-02-15 – 2018-02-16 (×4): 2 [IU] via SUBCUTANEOUS
  Administered 2018-02-16: 3 [IU] via SUBCUTANEOUS
  Administered 2018-02-17 (×3): 2 [IU] via SUBCUTANEOUS
  Administered 2018-02-18: 3 [IU] via SUBCUTANEOUS
  Administered 2018-02-18 – 2018-02-19 (×4): 2 [IU] via SUBCUTANEOUS
  Filled 2018-02-13 (×16): qty 1

## 2018-02-13 MED ORDER — ASPIRIN 81 MG PO CHEW
81.0000 mg | CHEWABLE_TABLET | ORAL | Status: DC
  Administered 2018-02-14 – 2018-02-19 (×3): 81 mg via ORAL
  Filled 2018-02-13 (×6): qty 1

## 2018-02-13 MED ORDER — METOPROLOL TARTRATE 25 MG PO TABS
25.0000 mg | ORAL_TABLET | Freq: Two times a day (BID) | ORAL | Status: DC
  Administered 2018-02-13: 25 mg via ORAL
  Filled 2018-02-13 (×2): qty 1

## 2018-02-13 MED ORDER — LORATADINE 10 MG PO TABS
10.0000 mg | ORAL_TABLET | Freq: Every day | ORAL | Status: DC
  Administered 2018-02-13 – 2018-02-19 (×7): 10 mg via ORAL
  Filled 2018-02-13 (×7): qty 1

## 2018-02-13 MED ORDER — DOCUSATE SODIUM 100 MG PO CAPS
100.0000 mg | ORAL_CAPSULE | Freq: Two times a day (BID) | ORAL | Status: DC
  Administered 2018-02-13 – 2018-02-19 (×13): 100 mg via ORAL
  Filled 2018-02-13 (×13): qty 1

## 2018-02-13 MED ORDER — FLUOXETINE HCL 20 MG PO CAPS
20.0000 mg | ORAL_CAPSULE | Freq: Every day | ORAL | Status: DC
  Administered 2018-02-14 – 2018-02-19 (×6): 20 mg via ORAL
  Filled 2018-02-13 (×7): qty 1

## 2018-02-13 MED ORDER — SODIUM CHLORIDE 0.9 % IV SOLN
INTRAVENOUS | Status: DC
  Administered 2018-02-13 – 2018-02-14 (×5): via INTRAVENOUS

## 2018-02-13 MED ORDER — SODIUM CHLORIDE 0.9 % IV BOLUS
1000.0000 mL | Freq: Once | INTRAVENOUS | Status: AC
  Administered 2018-02-13: 1000 mL via INTRAVENOUS

## 2018-02-13 MED ORDER — GABAPENTIN 300 MG PO CAPS
300.0000 mg | ORAL_CAPSULE | Freq: Two times a day (BID) | ORAL | Status: DC
  Administered 2018-02-13 – 2018-02-14 (×2): 300 mg via ORAL
  Filled 2018-02-13 (×3): qty 1

## 2018-02-13 MED ORDER — PANTOPRAZOLE SODIUM 40 MG PO TBEC
40.0000 mg | DELAYED_RELEASE_TABLET | Freq: Every day | ORAL | Status: DC
  Administered 2018-02-13 – 2018-02-19 (×7): 40 mg via ORAL
  Filled 2018-02-13 (×7): qty 1

## 2018-02-13 MED ORDER — HEPARIN (PORCINE) 25000 UT/250ML-% IV SOLN
1650.0000 [IU]/h | INTRAVENOUS | Status: DC
  Administered 2018-02-13: 1550 [IU]/h via INTRAVENOUS
  Administered 2018-02-14: 1950 [IU]/h via INTRAVENOUS
  Filled 2018-02-13 (×3): qty 250

## 2018-02-13 MED ORDER — OLMESARTAN MEDOXOMIL-HCTZ 40-25 MG PO TABS: 1.0000 | ORAL_TABLET | Freq: Every day | ORAL | Status: DC

## 2018-02-13 MED ORDER — CALCIUM CARBONATE ANTACID 500 MG PO CHEW
1.0000 | CHEWABLE_TABLET | Freq: Three times a day (TID) | ORAL | Status: DC
  Administered 2018-02-13 – 2018-02-19 (×18): 200 mg via ORAL
  Filled 2018-02-13 (×18): qty 1

## 2018-02-13 MED ORDER — INSULIN ASPART 100 UNIT/ML ~~LOC~~ SOLN
0.0000 [IU] | Freq: Every day | SUBCUTANEOUS | Status: DC
  Administered 2018-02-18: 2 [IU] via SUBCUTANEOUS
  Filled 2018-02-13: qty 1

## 2018-02-13 MED ORDER — HEPARIN BOLUS VIA INFUSION
2700.0000 [IU] | Freq: Once | INTRAVENOUS | Status: AC
  Administered 2018-02-13: 2700 [IU] via INTRAVENOUS
  Filled 2018-02-13: qty 2700

## 2018-02-13 MED ORDER — HYDROCHLOROTHIAZIDE 25 MG PO TABS
25.0000 mg | ORAL_TABLET | Freq: Every day | ORAL | Status: DC
  Administered 2018-02-13: 25 mg via ORAL
  Filled 2018-02-13: qty 1

## 2018-02-13 MED ORDER — HEPARIN SODIUM (PORCINE) 5000 UNIT/ML IJ SOLN
4000.0000 [IU] | Freq: Once | INTRAMUSCULAR | Status: AC
  Administered 2018-02-13: 4000 [IU] via INTRAVENOUS
  Filled 2018-02-13: qty 1

## 2018-02-13 MED ORDER — ACETAMINOPHEN 650 MG RE SUPP: 650.0000 mg | Freq: Four times a day (QID) | RECTAL | Status: DC | PRN

## 2018-02-13 MED ORDER — ONDANSETRON HCL 4 MG/2ML IJ SOLN: 4.0000 mg | Freq: Four times a day (QID) | INTRAMUSCULAR | Status: DC | PRN

## 2018-02-13 MED ORDER — SODIUM CHLORIDE 0.9% FLUSH
10.0000 mL | Freq: Two times a day (BID) | INTRAVENOUS | Status: DC
  Administered 2018-02-13 – 2018-02-18 (×8): 10 mL

## 2018-02-13 MED ORDER — ONDANSETRON HCL 4 MG PO TABS: 4.0000 mg | ORAL_TABLET | Freq: Four times a day (QID) | ORAL | Status: DC | PRN

## 2018-02-13 MED ORDER — ASPIRIN 81 MG PO CHEW
324.0000 mg | CHEWABLE_TABLET | Freq: Once | ORAL | Status: AC
  Administered 2018-02-13: 324 mg via ORAL
  Filled 2018-02-13: qty 4

## 2018-02-13 MED ORDER — SODIUM CHLORIDE 0.9% FLUSH: 10.0000 mL | INTRAVENOUS | Status: DC | PRN

## 2018-02-13 MED ORDER — HEPARIN (PORCINE) 25000 UT/250ML-% IV SOLN
1250.0000 [IU]/h | INTRAVENOUS | Status: DC
  Administered 2018-02-13: 1250 [IU]/h via INTRAVENOUS
  Filled 2018-02-13: qty 250

## 2018-02-13 MED ORDER — ACETAMINOPHEN 325 MG PO TABS
650.0000 mg | ORAL_TABLET | Freq: Four times a day (QID) | ORAL | Status: DC | PRN
  Administered 2018-02-13 – 2018-02-19 (×10): 650 mg via ORAL
  Filled 2018-02-13 (×10): qty 2

## 2018-02-13 NOTE — Progress Notes (Signed)
ANTICOAGULATION CONSULT NOTE - Initial Consult  Pharmacy Consult for heparin  Indication: chest pain/ACS  Allergies  Allergen Reactions  . Amoxicillin Hives    Has patient had a PCN reaction causing immediate rash, facial/tongue/throat swelling, SOB or lightheadedness with hypotension: No Has patient had a PCN reaction causing severe rash involving mucus membranes or skin necrosis: Yes Has patient had a PCN reaction that required hospitalization No Has patient had a PCN reaction occurring within the last 10 years: No If all of the above answers are "NO", then may proceed with Cephalosporin use.   . Fish Allergy Hives    Patient Measurements: Height: 5\' 5"  (165.1 cm) Weight: 286 lb 9.6 oz (130 kg) IBW/kg (Calculated) : 57 Heparin Dosing Weight: 89 kg  Vital Signs: Temp: 97.9 F (36.6 C) (12/31 0300) BP: 104/72 (12/31 0430) Pulse Rate: 103 (12/31 0500)  Labs: Recent Labs    02/13/18 0311 02/13/18 0355  HGB 11.0*  --   HCT 35.7*  --   PLT 468*  --   APTT 33  --   LABPROT 14.6  --   INR 1.15  --   CREATININE 2.09*  --   TROPONINI 0.89* 1.28*    Estimated Creatinine Clearance: 42.8 mL/min (A) (by C-G formula based on SCr of 2.09 mg/dL (H)).   Medical History: Past Medical History:  Diagnosis Date  . Arthritis   . Diabetes ([place])    Type 2  . Hypertension   . URI (upper respiratory infection)    took antibiotic and prednisone oct 2019 resolved  . Uses contact lenses     Medications:  Scheduled:  . heparin  4,000 Units Intravenous Once    Assessment: Patient admitted this am s/t having a syncopal episode s/p a gastric sleeve procedure 2 weeks ago. On arrival initial trops 0.89 >> 1.28. EKG showing RBBB w/ Left anterior fascicular block w/ T-wave inversion. Patient is not on any PTA anticoagulation. Being starting on a heparin drip for NSTEMI  Goal of Therapy:  Heparin level 0.3-0.7 units/ml Monitor platelets by anticoagulation protocol: Yes   Plan:   Heparin 4000 units IV x 1 was ordered Will start drip at 1250 units/hr Will check anti-Xa @ 1100 Baseline labs drawn Will monitor daily CBC's and adjust per anti-Xa levels  Tobie Lords, PharmD, BCPS Clinical Pharmacist 02/13/2018

## 2018-02-13 NOTE — Progress Notes (Signed)
ANTICOAGULATION CONSULT NOTE - Initial Consult  Pharmacy Consult for heparin  Indication: chest pain/ACS  Allergies  Allergen Reactions  . Amoxicillin Hives    Has patient had a PCN reaction causing immediate rash, facial/tongue/throat swelling, SOB or lightheadedness with hypotension: No Has patient had a PCN reaction causing severe rash involving mucus membranes or skin necrosis: Yes Has patient had a PCN reaction that required hospitalization No Has patient had a PCN reaction occurring within the last 10 years: No If all of the above answers are "NO", then may proceed with Cephalosporin use.   . Fish Allergy Hives    Patient Measurements: Height: 5\' 5"  (165.1 cm) Weight: 286 lb 9.6 oz (130 kg) IBW/kg (Calculated) : 57 Heparin Dosing Weight: 89 kg  Vital Signs: Temp: 97.9 F (36.6 C) (12/31 0300) BP: 106/79 (12/31 1230) Pulse Rate: 96 (12/31 1245)  Labs: Recent Labs    02/13/18 0311 02/13/18 0355 02/13/18 0906 02/13/18 1116  HGB 11.0*  --   --   --   HCT 35.7*  --   --   --   PLT 468*  --   --   --   APTT 33  --   --   --   LABPROT 14.6  --   --   --   INR 1.15  --   --   --   HEPARINUNFRC  --   --   --  <0.10*  CREATININE 2.09*  --   --   --   TROPONINI 0.89* 1.28* 1.51*  --     Estimated Creatinine Clearance: 42.8 mL/min (A) (by C-G formula based on SCr of 2.09 mg/dL (H)).   Medical History: Past Medical History:  Diagnosis Date  . Arthritis   . Diabetes ([place])    Type 2  . Hypertension   . URI (upper respiratory infection)    took antibiotic and prednisone oct 2019 resolved  . Uses contact lenses     Medications:  Scheduled:  . [START ON 02/14/2018] aspirin  81 mg Oral Q M,W,F  . calcium carbonate  1 tablet Oral TID  . docusate sodium  100 mg Oral BID  . [START ON 02/14/2018] FLUoxetine  20 mg Oral Daily  . gabapentin  300 mg Oral BID  . irbesartan  300 mg Oral Daily   And  . hydrochlorothiazide  25 mg Oral Daily  . loratadine  10 mg Oral Daily   . pantoprazole  40 mg Oral Daily  . sodium chloride flush  10-40 mL Intracatheter Q12H    Assessment: Patient admitted this am s/t having a syncopal episode s/p a gastric sleeve procedure 2 weeks ago. On arrival initial trops 0.89 >> 1.28. Troponins still elevated this afternoon. EKG showing RBBB w/ Left anterior fascicular block w/ T-wave inversion. Patient is not on any PTA anticoagulation. Being starting on a heparin drip for NSTEMI. Spoke with patient's nurse, not aware of any stop or pause in heparin drip.  Goal of Therapy:  Heparin level 0.3-0.7 units/ml Monitor platelets by anticoagulation protocol: Yes   Plan:  HL @ 1116 < 0.10 Will order a 2700 unit bolus, and increase rate to 1550 units/hr. Will check heparin level in 6 hours at 2000. Will order CBC with AM labs. Pharmacy will continue to monitor.  Paticia Stack, PharmD Pharmacy Resident  02/13/2018 1:16 PM

## 2018-02-13 NOTE — Progress Notes (Signed)
   02/13/18 1430  Clinical Encounter Type  Visited With Patient not available;Health care provider  Visit Type Code  Spiritual Encounters  Spiritual Needs Prayer   Chaplain responded to code blue, offering silent and energetic prayers as team worked.  After patient responded to care, chaplain was excused.

## 2018-02-13 NOTE — Consult Note (Signed)
[facility] Consultation Note  Patient ID: Kristen [name], MRN: [id number], DOB/AGE: Feb 25, 1965 52 y.o. Admit date: 02/13/2018   Date of Consult: 02/13/2018 Primary Physician: Glendale Chard, MD Primary Cardiologist: None  Chief Complaint:  Chief Complaint  Patient presents with  . Loss of Consciousness   Reason for Consult: Non-ST elevation myocardial infarction  HPI: 52 y.o. female with known diabetes essential hypertension mixed hyperlipidemia and obesity for which the patient has had recent knee surgery in the recent past as well as a recent bariatric surgery.  The patient did well with both of those surgeries with no evidence of complications.  In the postoperative period with her bariatric surgery she has had some issues with dehydration.  Dehydration has progressed for which the patient had dizziness weakness fatigue and unable to proceed on.  She got some IV fluids and felt a little bit better but then was walking up the steps to her house and ended up having significant issues with concerns of syncope.  Palpitations syncope weakness and fatigue was then evaluated here in the emergency room.  At that time she had new EKG changes normal sinus rhythm with left anterior fascicular block and new diffuse T wave inversions as well as an elevated troponin of 1.5 consistent with non-ST elevation myocardial infarction.  She also had worsening acute kidney dysfunction.  She does feel much better at this time after some hydration and other treatments and no chest pain  Past Medical History:  Diagnosis Date  . Arthritis   . Diabetes ([place])    Type 2  . Hypertension   . URI (upper respiratory infection)    took antibiotic and prednisone oct 2019 resolved  . Uses contact lenses       Surgical History:  Past Surgical History:  Procedure Laterality Date  . COLONOSCOPY WITH PROPOFOL N/A 03/18/2016   Procedure: COLONOSCOPY WITH PROPOFOL;  Surgeon: Carol Ada, MD;  Location:  [facility]OSCOPY;  Service: Endoscopy;  Laterality: N/A;  . LAPAROSCOPIC GASTRIC SLEEVE RESECTION N/A 01/29/2018   Procedure: LAPAROSCOPIC GASTRIC SLEEVE RESECTION WITH UPPER ENDO AND ERAS PATHWAY;  Surgeon: Kinsinger, Arta Thoams Siefert, MD;  Location: [facility] ORS;  Service: General;  Laterality: N/A;  . TOTAL HIP ARTHROPLASTY Right 08/04/2017   Procedure: RIGHT TOTAL HIP ARTHROPLASTY ANTERIOR APPROACH;  Surgeon: Mcarthur Rossetti, MD;  Location: [facility] ORS;  Service: Orthopedics;  Laterality: Right;     Home Meds: Prior to Admission medications   Medication Sig Start Date End Date Taking? Authorizing Provider  aspirin 81 MG chewable tablet Chew 1 tablet (81 mg total) by mouth 2 (two) times daily. Patient taking differently: Chew 81 mg by mouth every Monday, Wednesday, and Friday.  08/06/17  Yes Pete Pelt, PA-C  calcium carbonate (TUMS - DOSED IN MG ELEMENTAL CALCIUM) 500 MG chewable tablet Chew 1 tablet by mouth 3 (three) times daily.   Yes [provider]  cetirizine (ZYRTEC) 10 MG tablet Take 10 mg by mouth daily.   Yes [provider]  FLUoxetine (PROZAC) 20 MG tablet Take 1 tablet (20 mg total) by mouth daily. 01/19/18  Yes [name], FNP  gabapentin (NEURONTIN) 300 MG capsule Take 1 capsule (300 mg total) by mouth 2 (two) times daily. 01/30/18  Yes Kinsinger, Arta Caydence Koenig, MD  metFORMIN (GLUCOPHAGE XR) 500 MG 24 hr tablet Take 2 tabs by mouth twice a day Patient taking differently: 500 mg daily.  01/19/18  Yes [name], FNP  Multiple Vitamins-Minerals (BARIATRIC MULTIVITAMINS/IRON PO) Take  1 tablet by mouth 2 (two) times daily.   Yes [provider]  olmesartan-hydrochlorothiazide (BENICAR HCT) 40-25 MG tablet Take 1 tablet by mouth daily. 01/19/18  Yes [name], FNP  pantoprazole (PROTONIX) 40 MG tablet Take 1 tablet (40 mg total) by mouth daily. 01/30/18  Yes Kinsinger, Arta Kimball Manske, MD  albuterol (PROVENTIL HFA;VENTOLIN HFA) 108 (90 Base) MCG/ACT inhaler Inhale  1-2 puffs into the lungs every 6 (six) hours as needed for wheezing or shortness of breath. 01/19/18   [name], FNP  blood glucose meter kit and supplies KIT Dispense based on patient and insurance preference. Use up to four times daily as directed. (FOR ICD-9 250.00, 250.01). 02/05/18   [name], FNP  ondansetron (ZOFRAN-ODT) 4 MG disintegrating tablet Take 1 tablet (4 mg total) by mouth every 6 (six) hours as needed for nausea or vomiting. 01/30/18   Kinsinger, Arta Sadao Weyer, MD    Inpatient Medications:  . [START ON 02/14/2018] aspirin  81 mg Oral Q M,W,F  . calcium carbonate  1 tablet Oral TID  . docusate sodium  100 mg Oral BID  . [START ON 02/14/2018] FLUoxetine  20 mg Oral Daily  . gabapentin  300 mg Oral BID  . heparin  2,700 Units Intravenous Once  . irbesartan  300 mg Oral Daily   And  . hydrochlorothiazide  25 mg Oral Daily  . loratadine  10 mg Oral Daily  . pantoprazole  40 mg Oral Daily  . sodium chloride flush  10-40 mL Intracatheter Q12H   . heparin      Allergies:  Allergies  Allergen Reactions  . Amoxicillin Hives    Has patient had a PCN reaction causing immediate rash, facial/tongue/throat swelling, SOB or lightheadedness with hypotension: No Has patient had a PCN reaction causing severe rash involving mucus membranes or skin necrosis: Yes Has patient had a PCN reaction that required hospitalization No Has patient had a PCN reaction occurring within the last 10 years: No If all of the above answers are "NO", then may proceed with Cephalosporin use.   . Fish Allergy Hives    Social History   Socioeconomic History  . Marital status: Married    Spouse name: Not on file  . Number of children: Not on file  . Years of education: Not on file  . Highest education level: Not on file  Occupational History  . Not on file  Social Needs  . Financial resource strain: Not on file  . Food insecurity:    Worry: Not on file    Inability: Not on file  .  Transportation needs:    Medical: Not on file    Non-medical: Not on file  Tobacco Use  . Smoking status: Never Smoker  . Smokeless tobacco: Never Used  Substance and Sexual Activity  . Alcohol use: Not Currently    Comment: OCCASIONAL   . Drug use: No  . Sexual activity: Not on file  Lifestyle  . Physical activity:    Days per week: Not on file    Minutes per session: Not on file  . Stress: Not on file  Relationships  . Social connections:    Talks on phone: Not on file    Gets together: Not on file    Attends religious service: Not on file    Active member of club or organization: Not on file    Attends meetings of clubs or organizations: Not on file    Relationship status: Not on file  .  Intimate partner violence:    Fear of current or ex partner: Not on file    Emotionally abused: Not on file    Physically abused: Not on file    Forced sexual activity: Not on file  Other Topics Concern  . Not on file  Social History Narrative  . Not on file     Family History  Problem Relation Age of Onset  . Hypertension Mother   . Diabetes Mother   . Hypertension Father   . Diabetes Father   . COPD Father   . Stroke Father   . Congestive Heart Failure Father      Review of Systems Positive for weakness fatigue shortness of breath Negative for: General:  chills, fever, night sweats or weight changes.  Cardiovascular: PND orthopnea syncope dizziness  Dermatological skin lesions rashes Respiratory: Cough congestion Urologic: Frequent urination urination at night and hematuria Abdominal: negative for nausea, vomiting, diarrhea, bright red blood per rectum, melena, or hematemesis Neurologic: negative for visual changes, and/or hearing changes  All other systems reviewed and are otherwise negative except as noted above.  Labs: Recent Labs    02/13/18 0311 02/13/18 0355 02/13/18 0906  TROPONINI 0.89* 1.28* 1.51*   Lab Results  Component Value Date   WBC 12.8 (H)  02/13/2018   HGB 11.0 (L) 02/13/2018   HCT 35.7 (L) 02/13/2018   MCV 83.8 02/13/2018   PLT 468 (H) 02/13/2018    Recent Labs  Lab 02/13/18 0311  NA 137  K 3.7  CL 104  CO2 18*  BUN 39*  CREATININE 2.09*  CALCIUM 9.0  GLUCOSE 265*   Lab Results  Component Value Date   CHOL 98 02/13/2018   HDL 30 (L) 02/13/2018   LDLCALC 54 02/13/2018   TRIG 71 02/13/2018   No results found for: DDIMER  Radiology/Studies:  No results found.  EKG: Normal sinus rhythm left anterior fascicular block with diffuse T wave inversions  Weights: Filed Weights   02/13/18 0301  Weight: 130 kg     Physical Exam: Blood pressure 106/79, pulse 96, temperature 97.9 F (36.6 C), resp. rate 16, height '5\' 5"'  (1.651 m), weight 130 kg, last menstrual period 01/18/2018, SpO2 100 %. Body mass index is 47.69 kg/m. General: Well developed, well nourished, in no acute distress. Head eyes ears nose throat: Normocephalic, atraumatic, sclera non-icteric, no xanthomas, nares are without discharge. No apparent thyromegaly and/or mass  Lungs: Normal respiratory effort.  no wheezes, no rales, no rhonchi.  Heart: RRR with normal S1 S2. no murmur gallop, no rub, PMI is normal size and placement, carotid upstroke normal without bruit, jugular venous pressure is normal Abdomen: Soft, non-tender, non-distended with normoactive bowel sounds. No hepatomegaly. No rebound/guarding. No obvious abdominal masses. Abdominal aorta is normal size without bruit Extremities: No edema. no cyanosis, no clubbing, no ulcers  Peripheral : 2+ bilateral upper extremity pulses, 2+ bilateral femoral pulses, 2+ bilateral dorsal pedal pulse Neuro: Alert and oriented. No facial asymmetry. No focal deficit. Moves all extremities spontaneously. Musculoskeletal: Normal muscle tone without kyphosis Psych:  Responds to questions appropriately with a normal affect.    Assessment: 52 year old female with essential hypertension mixed hyperlipidemia  diabetes with acute renal dysfunction status post recent bariatric surgery and acute non-ST elevation myocardial infarction  Plan: 1.  Heparin for further risk reduction of myocardial infarction 2.  Serial ECG and enzymes to assess for extent of myocardial infarction 3.  Echocardiogram for LV systolic dysfunction valvular heart disease 4.  Beta-blocker  as able for myocardial infarction 5.  Proceed to cardiac catheterization to assess coronary anatomy and further treatment thereof is necessary.  Patient understands the risk and benefits of cardiac catheterization.  This includes a possibility of death stroke heart attack infection bleeding or blood clot.  She is low risk for conscious sedation  Signed, Corey Skains M.D. [facility] 02/13/2018, 1:39 PM

## 2018-02-13 NOTE — ED Notes (Signed)
New cuff applied

## 2018-02-13 NOTE — Progress Notes (Addendum)
CODE BLUE called at 2:32 PM On arrival patient was being bagged.  Dr. Jefferson Fuel and Dr. Saunders Revel present.  I took over.  Patient had brief chest compressions.  Woke up and talked. Not cardiac arrest.  Syncopal episode. Patient tells me she felt dizzy and lightheaded and then passed out.  Similar to her episode prior to admission. Blood sugars 141.  Blood pressure was 134/92 with heart rate of 105. Stat EKG checked and shows right bundle branch block.  No acute ST changes.  Physical examination Patient alert and oriented S1, S2 with no murmurs Lungs clear to auscultation Neuro.  Motor strength 5/5 in upper and lower extremities.   *Syncope Telemetry reviewed and during chest compressions patient had a lot of artifact but it is unclear if she had a ventricular arrhythmia.  Discussed with Dr. Nehemiah Massed.  Will see the patient.  Started metoprolol.  Troponin not elevated.  She recently had bariatric surgery.  Unable to get a CTA of the chest due to elevated creatinine.  Will check a d-dimer. Stat CT head and chest x-ray ordered.  Continue telemetry monitoring.  Repeat BMP and magnesium.  *Acute kidney injury.  Likely dehydration.  Discontinued hydrochlorothiazide.  Start IV fluids.  Monitor input and output.  * NSTEMI Cath planned by cardiology  Discussed and signed out to Dr. Benjie Karvonen.  Critical care time - 40 minutes

## 2018-02-13 NOTE — Progress Notes (Signed)
At approximately 1430.  Patient stopped talking, she had a distant gaze in her eyes.  Her head then rolled back and she stopped breathing,  Found no palpable pulse.  A Code Blue was initiated with chest compressions and breathing assistance with an ambu bag.  After approximately 3 minutes she had Rosc and regained consciousness.  She is alert and oriented.  Says she felt like she had fallen asleep.  She is in sinus rhythm with a HR in the 90's.  Her oxygen sats are in the high 90's on room air.

## 2018-02-13 NOTE — ED Notes (Addendum)
Date and time results received: 02/13/18  (use smartphrase ".now" to insert current time)  Test: trop Critical Value: 0.89  Name of Provider Notified: Karma Greaser  Orders Received? Or Actions Taken?: redraw

## 2018-02-13 NOTE — ED Triage Notes (Signed)
Arrived via EMS pt had a gastric sleeve  2 weeks ago and today felt weak and tired. She went to the clinic and had 1 liter of fluid. She still felt week and tonight had a syncopal episode. Husband was present. Pt is A&O x 4 and a good historian

## 2018-02-13 NOTE — ED Notes (Signed)
Pt informed of urine sample needed

## 2018-02-13 NOTE — Progress Notes (Signed)
ANTICOAGULATION CONSULT NOTE - Consult  Pharmacy Consult for heparin  Indication: chest pain/ACS  Allergies  Allergen Reactions  . Amoxicillin Hives    Has patient had a PCN reaction causing immediate rash, facial/tongue/throat swelling, SOB or lightheadedness with hypotension: No Has patient had a PCN reaction causing severe rash involving mucus membranes or skin necrosis: Yes Has patient had a PCN reaction that required hospitalization No Has patient had a PCN reaction occurring within the last 10 years: No If all of the above answers are "NO", then may proceed with Cephalosporin use.   . Fish Allergy Hives    Patient Measurements: Height: 5\' 4"  (162.6 cm) Weight: (!) 304 lb 12.8 oz (138.3 kg) IBW/kg (Calculated) : 54.7 Heparin Dosing Weight: 89 kg  Vital Signs: Temp: 98.4 F (36.9 C) (12/31 1929) Temp Source: Oral (12/31 1929) BP: 91/74 (12/31 1957) Pulse Rate: 93 (12/31 1957)  Labs: Recent Labs    02/13/18 0311 02/13/18 0355 02/13/18 0906 02/13/18 1116 02/13/18 1640 02/13/18 1946  HGB 11.0*  --   --   --   --   --   HCT 35.7*  --   --   --   --   --   PLT 468*  --   --   --   --   --   APTT 33  --   --   --   --   --   LABPROT 14.6  --   --   --   --   --   INR 1.15  --   --   --   --   --   HEPARINUNFRC  --   --   --  <0.10*  --  <0.10*  CREATININE 2.09*  --   --   --  1.93*  --   TROPONINI 0.89* 1.28* 1.51*  --  1.82*  --     Estimated Creatinine Clearance: 47.4 mL/min (A) (by C-G formula based on SCr of 1.93 mg/dL (H)).   Medical History: Past Medical History:  Diagnosis Date  . Arthritis   . Diabetes ([place])    Type 2  . Hypertension   . URI (upper respiratory infection)    took antibiotic and prednisone oct 2019 resolved  . Uses contact lenses     Medications:  Scheduled:  . [START ON 02/14/2018] aspirin  81 mg Oral Q M,W,F  . calcium carbonate  1 tablet Oral TID  . docusate sodium  100 mg Oral BID  . [START ON 02/14/2018] FLUoxetine  20 mg  Oral Daily  . gabapentin  300 mg Oral BID  . insulin aspart  0-5 Units Subcutaneous QHS  . [START ON 02/14/2018] insulin aspart  0-9 Units Subcutaneous TID WC  . irbesartan  300 mg Oral Daily  . loratadine  10 mg Oral Daily  . metoprolol tartrate  25 mg Oral BID  . pantoprazole  40 mg Oral Daily  . sodium chloride flush  10-40 mL Intracatheter Q12H    Assessment: Patient admitted this am s/t having a syncopal episode s/p a gastric sleeve procedure 2 weeks ago. On arrival initial trops 0.89 >> 1.28. Troponins still elevated this afternoon. EKG showing RBBB w/ Left anterior fascicular block w/ T-wave inversion. Patient is not on any PTA anticoagulation. Being starting on a heparin drip for NSTEMI. Spoke with patient's nurse, not aware of any stop or pause in heparin drip.  Goal of Therapy:  Heparin level 0.3-0.7 units/ml Monitor platelets by anticoagulation protocol:  Yes   Plan:  HL @ 1116 < 0.10 HL @ 2000 < 0.10 @ 1550units/hr  Will order a 2700 unit bolus, and increase rate to 1800 units/hr. Will check heparin level in 6 hours at 0300. Will order CBC with AM labs. Pharmacy will continue to monitor.  Lu Duffel, PharmD, BCPS Clinical Pharmacist 02/13/2018 8:21 PM

## 2018-02-13 NOTE — ED Notes (Signed)
IV team at bedside 

## 2018-02-13 NOTE — H&P (Signed)
Kristen [name] is an 52 y.o. female.   Chief Complaint: Syncope HPI: The patient with past medical history of diabetes, hypertension and obesity presents to the emergency department following an episode of syncope.  The patient's husband reports that she fainted but did not fall or hurt herself as he caught her.  She denies chest pain or shortness of breath.  EKG in the emergency department showed ST changes.  Her initial troponin was elevated but prompted immediate repeat as it did not seem consistent with her clinical presentation.  Repeat troponin was further elevated.  Notably, laboratory evaluation revealed increase in BUN/creatinine.  The patient was started on a heparin drip prior to the emergency department staff calling the hospitalist service for admission.  Past Medical History:  Diagnosis Date  . Arthritis   . Diabetes ([place])    Type 2  . Hypertension   . URI (upper respiratory infection)    took antibiotic and prednisone oct 2019 resolved  . Uses contact lenses     Past Surgical History:  Procedure Laterality Date  . COLONOSCOPY WITH PROPOFOL N/A 03/18/2016   Procedure: COLONOSCOPY WITH PROPOFOL;  Surgeon: Carol Ada, MD;  Location: [facility];  Service: Endoscopy;  Laterality: N/A;  . LAPAROSCOPIC GASTRIC SLEEVE RESECTION N/A 01/29/2018   Procedure: LAPAROSCOPIC GASTRIC SLEEVE RESECTION WITH UPPER ENDO AND ERAS PATHWAY;  Surgeon: Kinsinger, Arta Bruce, MD;  Location: [facility];  Service: General;  Laterality: N/A;  . TOTAL HIP ARTHROPLASTY Right 08/04/2017   Procedure: RIGHT TOTAL HIP ARTHROPLASTY ANTERIOR APPROACH;  Surgeon: Mcarthur Rossetti, MD;  Location: [facility];  Service: Orthopedics;  Laterality: Right;    Family History  Problem Relation Age of Onset  . Hypertension Mother   . Diabetes Mother   . Hypertension Father   . Diabetes Father   . COPD Father   . Stroke Father   . Congestive Heart Failure Father    Social History:  reports that she has never  smoked. She has never used smokeless tobacco. She reports previous alcohol use. She reports that she does not use drugs.  Allergies:  Allergies  Allergen Reactions  . Amoxicillin Hives    Has patient had a PCN reaction causing immediate rash, facial/tongue/throat swelling, SOB or lightheadedness with hypotension: No Has patient had a PCN reaction causing severe rash involving mucus membranes or skin necrosis: Yes Has patient had a PCN reaction that required hospitalization No Has patient had a PCN reaction occurring within the last 10 years: No If all of the above answers are "NO", then may proceed with Cephalosporin use.   . Fish Allergy Hives    Prior to Admission medications   Medication Sig Start Date End Date Taking? Authorizing Provider  aspirin 81 MG chewable tablet Chew 1 tablet (81 mg total) by mouth 2 (two) times daily. Patient taking differently: Chew 81 mg by mouth every Monday, Wednesday, and Friday.  08/06/17  Yes Pete Pelt, PA-C  calcium carbonate (TUMS - DOSED IN MG ELEMENTAL CALCIUM) 500 MG chewable tablet Chew 1 tablet by mouth 3 (three) times daily.   Yes [provider]  cetirizine (ZYRTEC) 10 MG tablet Take 10 mg by mouth daily.   Yes [provider]  FLUoxetine (PROZAC) 20 MG tablet Take 1 tablet (20 mg total) by mouth daily. 01/19/18  Yes [name], FNP  gabapentin (NEURONTIN) 300 MG capsule Take 1 capsule (300 mg total) by mouth 2 (two) times daily. 01/30/18  Yes Kinsinger, Arta Bruce, MD  metFORMIN (GLUCOPHAGE XR) 500 MG 24 hr tablet Take 2 tabs by mouth twice a day Patient taking differently: 500 mg daily.  01/19/18  Yes [name], FNP  Multiple Vitamins-Minerals (BARIATRIC MULTIVITAMINS/IRON PO) Take 1 tablet by mouth 2 (two) times daily.   Yes [provider]  olmesartan-hydrochlorothiazide (BENICAR HCT) 40-25 MG tablet Take 1 tablet by mouth daily. 01/19/18  Yes [name], FNP  pantoprazole (PROTONIX) 40 MG tablet Take  1 tablet (40 mg total) by mouth daily. 01/30/18  Yes Kinsinger, Arta Bruce, MD  albuterol (PROVENTIL HFA;VENTOLIN HFA) 108 (90 Base) MCG/ACT inhaler Inhale 1-2 puffs into the lungs every 6 (six) hours as needed for wheezing or shortness of breath. 01/19/18   [name], FNP  blood glucose meter kit and supplies KIT Dispense based on patient and insurance preference. Use up to four times daily as directed. (FOR ICD-9 250.00, 250.01). 02/05/18   [name], FNP  ondansetron (ZOFRAN-ODT) 4 MG disintegrating tablet Take 1 tablet (4 mg total) by mouth every 6 (six) hours as needed for nausea or vomiting. 01/30/18   Kinsinger, Arta Bruce, MD     Results for orders placed or performed during the hospital encounter of 02/13/18 (from the past 48 hour(s))  Basic metabolic panel     Status: Abnormal   Collection Time: 02/13/18  3:11 AM  Result Value Ref Range   Sodium 137 135 - 145 mmol/L   Potassium 3.7 3.5 - 5.1 mmol/L   Chloride 104 98 - 111 mmol/L   CO2 18 (L) 22 - 32 mmol/L   Glucose, Bld 265 (H) 70 - 99 mg/dL   BUN 39 (H) 6 - 20 mg/dL   Creatinine, Ser 2.09 (H) 0.44 - 1.00 mg/dL   Calcium 9.0 8.9 - 10.3 mg/dL   GFR calc non Af Amer 27 (L) >60 mL/min   GFR calc Af Amer 31 (L) >60 mL/min   Anion gap 15 5 - 15    Comment: Performed at [facility], [address] [place], [place] [address]  CBC     Status: Abnormal   Collection Time: 02/13/18  3:11 AM  Result Value Ref Range   WBC 12.8 (H) 4.0 - 10.5 K/uL   RBC 4.26 3.87 - 5.11 MIL/uL   Hemoglobin 11.0 (L) 12.0 - 15.0 g/dL   HCT 35.7 (L) 36.0 - 46.0 %   MCV 83.8 80.0 - 100.0 fL   MCH 25.8 (L) 26.0 - 34.0 pg   MCHC 30.8 30.0 - 36.0 g/dL   RDW 18.2 (H) 11.5 - 15.5 %   Platelets 468 (H) 150 - 400 K/uL   nRBC 0.0 0.0 - 0.2 %    Comment: Performed at [facility], [address] [place], [place] [address]  Troponin I - ONCE - STAT     Status: Abnormal   Collection Time: 02/13/18  3:11 AM  Result Value Ref Range    Troponin I 0.89 (HH) <0.03 ng/mL    Comment: CRITICAL RESULT CALLED TO, READ BACK BY AND VERIFIED WITH JENNIFER DAILY 02/13/18 0344 [facility] Performed at [facility], [address] [place], [place] [address]   Magnesium     Status: None   Collection Time: 02/13/18  3:11 AM  Result Value Ref Range   Magnesium 2.1 1.7 - 2.4 mg/dL    Comment: Performed at [facility], [address] [place], [place] [address]  Protime-INR     Status: None   Collection Time: 02/13/18  3:11  AM  Result Value Ref Range   Prothrombin Time 14.6 11.4 - 15.2 seconds   INR 1.15     Comment: Performed at [facility], [address] [place], [place] [address]  APTT     Status: None   Collection Time: 02/13/18  3:11 AM  Result Value Ref Range   aPTT 33 24 - 36 seconds    Comment: Performed at [facility], [address] [place], [place] [address]  Troponin I - Once-Timed     Status: Abnormal   Collection Time: 02/13/18  3:55 AM  Result Value Ref Range   Troponin I 1.28 (HH) <0.03 ng/mL    Comment: CRITICAL VALUE NOTED. VALUE IS CONSISTENT WITH PREVIOUSLY REPORTED/CALLED VALUE [facility] Performed at [facility], [address] [place],  [address]    No results found.  Review of Systems  Constitutional: Negative for chills and fever.  HENT: Negative for sore throat and tinnitus.   Eyes: Negative for blurred vision and redness.  Respiratory: Negative for cough and shortness of breath.   Cardiovascular: Negative for chest pain, palpitations, orthopnea and PND.  Gastrointestinal: Negative for abdominal pain, diarrhea, nausea and vomiting.  Genitourinary: Negative for dysuria, frequency and urgency.  Musculoskeletal: Negative for joint pain and myalgias.  Skin: Negative for rash.       No lesions  Neurological: Positive for loss of consciousness. Negative for speech change, focal weakness and weakness.  Endo/Heme/Allergies: Does not bruise/bleed easily.        No temperature intolerance  Psychiatric/Behavioral: Negative for depression and suicidal ideas.    Blood pressure (!) 125/97, pulse 100, temperature 97.9 F (36.6 C), resp. rate 18, height '5\' 5"'  (1.651 m), weight 130 kg, last menstrual period 01/18/2018, SpO2 99 %. Physical Exam  Vitals reviewed. Constitutional: She is oriented to person, place, and time. She appears well-developed and well-nourished. No distress.  HENT:  Head: Normocephalic and atraumatic.  Mouth/Throat: Oropharynx is clear and moist.  Eyes: Pupils are equal, round, and reactive to light. Conjunctivae and EOM are normal. No scleral icterus.  Neck: Normal range of motion. Neck supple. No JVD present. No tracheal deviation present. No thyromegaly present.  Cardiovascular: Normal rate, regular rhythm and normal heart sounds. Exam reveals no gallop and no friction rub.  No murmur heard. Respiratory: Effort normal and breath sounds normal.  GI: Soft. Bowel sounds are normal. She exhibits no distension. There is no abdominal tenderness.  Genitourinary:    Genitourinary Comments: Deferred   Musculoskeletal: Normal range of motion.        General: No edema.  Lymphadenopathy:    She has no cervical adenopathy.  Neurological: She is alert and oriented to person, place, and time. No cranial nerve deficit. She exhibits normal muscle tone.  Skin: Skin is warm and dry. No rash noted. No erythema.  Psychiatric: She has a normal mood and affect. Her behavior is normal. Judgment and thought content normal.     Assessment/Plan This is a 52 year old female admitted for NSTEMI 1.  NSTEMI: Elevated troponins and ST changes.  Continue to follow cardiac biomarkers.  Patient is currently on therapeutic heparin.  Consult cardiology.  Continue to monitor telemetry. 2.  Acute kidney injury: Secondary to dehydration.  Hydrate with intravenous fluid.  Avoid nephrotoxic agents. 3.  Hypertension: Controlled; continue olmesartan.  Labetalol  as needed. 4.  Diabetes mellitus type 2: Hold oral hypoglycemic agents.  Sliding scale insulin while hospitalized 5.  DVT prophylaxis: As above 6.  GI prophylaxis: None The patient is a full code.  Time spent on admission orders and patient care approximately 45 minutes  Harrie Foreman, MD 02/13/2018, 8:49 AM

## 2018-02-13 NOTE — ED Provider Notes (Signed)
[facility] Emergency Department Provider Note  ____________________________________________   First MD Initiated Contact with Patient 02/13/18 [phone]     (approximate)  I have reviewed the triage vital signs and the nursing notes.   HISTORY  Chief Complaint Loss of Consciousness    HPI Kristen [name] is a 52 y.o. female with medical history as listed below who presents for evaluation after a syncopal episode.  She reports that she is 2 weeks out from a gastric bypass operation at [facility].  She has been feeling well until the last couple of days when she has begun to feel lightheaded and dizzy particularly with change of position and ambulation.  Just prior to arrival she got up and says that she took her time so that maybe she would not feel lightheaded but in spite of that she felt a little bit short of breath, was lightheaded, and then her husband told her that she passed out.  She did not sustain any injury when she fell and he was able to help her.  She was back to her baseline as soon as she woke up and the loss of consciousness was brief.  She arrived by EMS and she was both hypotensive and tachycardic for paramedics and upon arrival in the emergency department  She reports that she feels fine as long as she is lying still.  She is awake, alert, and oriented.  She denies chest pain and had only brief shortness of breath during the syncopal episode.  She denies fever/chills, nausea, vomiting, abdominal pain, and dysuria.  She reports that she is actually been doing quite well after her surgery.  She has a history of diabetes controlled with metformin and a history of hypertension.  She has no history of tobacco use nor hypercholesterolemia.  Her father had a heart attack.  Past Medical History:  Diagnosis Date  . Arthritis   . Diabetes ([place])    Type 2  . Hypertension   . URI (upper respiratory infection)    took antibiotic and prednisone oct 2019  resolved  . Uses contact lenses     Patient Active Problem List   Diagnosis Date Noted  . NSTEMI (non-ST elevated myocardial infarction) ([place]) 02/13/2018  . Status post gastrectomy 02/05/2018  . Essential hypertension 02/05/2018  . Type 2 diabetes mellitus without complication, without long-term current use of insulin ([place]) 02/05/2018  . Obesity 01/29/2018  . Status post total replacement of right hip 08/04/2017  . Unilateral primary osteoarthritis, right knee 07/06/2016  . Unilateral primary osteoarthritis, right hip 07/06/2016  . Acute pain of right knee 07/06/2016  . Pain in right hip 07/06/2016    Past Surgical History:  Procedure Laterality Date  . COLONOSCOPY WITH PROPOFOL N/A 03/18/2016   Procedure: COLONOSCOPY WITH PROPOFOL;  Surgeon: Carol Ada, MD;  Location: [facility] ENDOSCOPY;  Service: Endoscopy;  Laterality: N/A;  . LAPAROSCOPIC GASTRIC SLEEVE RESECTION N/A 01/29/2018   Procedure: LAPAROSCOPIC GASTRIC SLEEVE RESECTION WITH UPPER ENDO AND ERAS PATHWAY;  Surgeon: Kinsinger, Arta Bruce, MD;  Location: [facility] ORS;  Service: General;  Laterality: N/A;  . TOTAL HIP ARTHROPLASTY Right 08/04/2017   Procedure: RIGHT TOTAL HIP ARTHROPLASTY ANTERIOR APPROACH;  Surgeon: Mcarthur Rossetti, MD;  Location: [facility] ORS;  Service: Orthopedics;  Laterality: Right;    Prior to Admission medications   Medication Sig Start Date End Date Taking? Authorizing Provider  aspirin 81 MG chewable tablet Chew 1 tablet (81 mg total) by mouth 2 (two) times daily.  Patient taking differently: Chew 81 mg by mouth every Monday, Wednesday, and Friday.  08/06/17  Yes Pete Pelt, PA-C  calcium carbonate (TUMS - DOSED IN MG ELEMENTAL CALCIUM) 500 MG chewable tablet Chew 1 tablet by mouth 3 (three) times daily.   Yes [provider]  cetirizine (ZYRTEC) 10 MG tablet Take 10 mg by mouth daily.   Yes [provider]  FLUoxetine (PROZAC) 20 MG tablet Take 1 tablet (20 mg total) by mouth daily.  01/19/18  Yes [name], FNP  gabapentin (NEURONTIN) 300 MG capsule Take 1 capsule (300 mg total) by mouth 2 (two) times daily. 01/30/18  Yes Kinsinger, Arta Bruce, MD  metFORMIN (GLUCOPHAGE XR) 500 MG 24 hr tablet Take 2 tabs by mouth twice a day Patient taking differently: 500 mg daily.  01/19/18  Yes [name], FNP  Multiple Vitamins-Minerals (BARIATRIC MULTIVITAMINS/IRON PO) Take 1 tablet by mouth 2 (two) times daily.   Yes [provider]  olmesartan-hydrochlorothiazide (BENICAR HCT) 40-25 MG tablet Take 1 tablet by mouth daily. 01/19/18  Yes [name], FNP  pantoprazole (PROTONIX) 40 MG tablet Take 1 tablet (40 mg total) by mouth daily. 01/30/18  Yes Kinsinger, Arta Bruce, MD  albuterol (PROVENTIL HFA;VENTOLIN HFA) 108 (90 Base) MCG/ACT inhaler Inhale 1-2 puffs into the lungs every 6 (six) hours as needed for wheezing or shortness of breath. 01/19/18   [name], FNP  blood glucose meter kit and supplies KIT Dispense based on patient and insurance preference. Use up to four times daily as directed. (FOR ICD-9 250.00, 250.01). 02/05/18   [name], FNP  ondansetron (ZOFRAN-ODT) 4 MG disintegrating tablet Take 1 tablet (4 mg total) by mouth every 6 (six) hours as needed for nausea or vomiting. 01/30/18   Kinsinger, Arta Bruce, MD    Allergies Amoxicillin and Fish allergy  Family History  Problem Relation Age of Onset  . Hypertension Mother   . Diabetes Mother   . Hypertension Father   . Diabetes Father   . COPD Father   . Stroke Father   . Congestive Heart Failure Father     Social History Social History   Tobacco Use  . Smoking status: Never Smoker  . Smokeless tobacco: Never Used  Substance Use Topics  . Alcohol use: Not Currently    Comment: OCCASIONAL   . Drug use: No    Review of Systems Constitutional: No fever/chills Eyes: No visual changes. ENT: No sore throat. Cardiovascular: Syncope as described above.  Denies chest pain. Respiratory:  Brief shortness of breath associated with the syncopal episode. Gastrointestinal: No abdominal pain.  No nausea, no vomiting.  No diarrhea.  No constipation. Genitourinary: Negative for dysuria. Musculoskeletal: Negative for neck pain.  Negative for back pain. Integumentary: Negative for rash. Neurological: Negative for headaches, focal weakness or numbness.   ____________________________________________   PHYSICAL EXAM:  VITAL SIGNS: ED Triage Vitals  Enc Vitals Group     BP 02/13/18 0300 112/62     Pulse Rate 02/13/18 0300 (!) 122     Resp 02/13/18 0300 17     Temp 02/13/18 0300 97.9 F (36.6 C)     Temp src --      SpO2 02/13/18 0300 93 %     Weight 02/13/18 0301 130 kg (286 lb 9.6 oz)     Height 02/13/18 0301 1.651 m ('5\' 5"' )     Head Circumference --      Peak Flow --      Pain Score 02/13/18  0301 0     Pain Loc --      Pain Edu? --      Excl. in [place]? --     Constitutional: Alert and oriented. Well appearing and in no acute distress. Eyes: Conjunctivae are normal.  Head: Atraumatic. Nose: No congestion/rhinnorhea. Mouth/Throat: Mucous membranes are moist. Neck: No stridor.  No meningeal signs.   Cardiovascular: Tachycardia, regular rhythm. Good peripheral circulation. Grossly normal heart sounds. Respiratory: Normal respiratory effort.  No retractions. Lungs CTAB. Gastrointestinal: Morbid obesity.  Soft and nontender. No distention.  Well-healing laparoscopic surgical scars. Musculoskeletal: No lower extremity tenderness nor edema. No gross deformities of extremities. Neurologic:  Normal speech and language. No gross focal neurologic deficits are appreciated.  Skin:  Skin is warm, dry and intact. No rash noted. Psychiatric: Mood and affect are normal. Speech and behavior are normal.  ____________________________________________   LABS (all labs ordered are listed, but only abnormal results are displayed)  Labs Reviewed  BASIC METABOLIC PANEL - Abnormal; Notable  for the following components:      Result Value   CO2 18 (*)    Glucose, Bld 265 (*)    BUN 39 (*)    Creatinine, Ser 2.09 (*)    GFR calc non Af Amer 27 (*)    GFR calc Af Amer 31 (*)    All other components within normal limits  CBC - Abnormal; Notable for the following components:   WBC 12.8 (*)    Hemoglobin 11.0 (*)    HCT 35.7 (*)    MCH 25.8 (*)    RDW 18.2 (*)    Platelets 468 (*)    All other components within normal limits  TROPONIN I - Abnormal; Notable for the following components:   Troponin I 0.89 (*)    All other components within normal limits  TROPONIN I - Abnormal; Notable for the following components:   Troponin I 1.28 (*)    All other components within normal limits  MAGNESIUM  PROTIME-INR  APTT  URINALYSIS, COMPLETE (UACMP) WITH MICROSCOPIC  HEPARIN LEVEL (UNFRACTIONATED)   ____________________________________________  EKG  ED ECG REPORT #1 I, Hinda Kehr, the attending physician, personally viewed and interpreted this ECG.  Date: 02/13/2018 EKG Time: 2:58 AM Rate: 125 Rhythm: Sinus tachycardia QRS Axis: normal Intervals: Right bundle branch block ST/T Wave abnormalities: T wave inversions in leads III, aVF, V2, V3, and V4.  Narrative Interpretation: Concern for possible ischemia.  EKG is substantially changed from the preop EKG obtained 2 weeks ago.  Some artifact is present.  ED ECG REPORT #2 I, Hinda Kehr, the attending physician, personally viewed and interpreted this ECG.  Date: 02/13/2018 EKG Time: 4:53 AM Rate: 106 Rhythm: Sinus tachycardia QRS Axis: normal Intervals: Incomplete right bundle branch block and left anterior fascicular block.  Prolonged QTC at 552 ms. ST/T Wave abnormalities: Deep T waves are more easily noted in 3, aVF, V2, V3, V4, and less so in V5.  It is concerning for acute ischemia and again change from the prior EKG 2 weeks ago. Narrative Interpretation: Concerning for acute ischemia but does not meet STEMI  criteria.   ____________________________________________  RADIOLOGY   ED MD interpretation: No indication for imaging  Official radiology report(s): No results found.  ____________________________________________   PROCEDURES  Critical Care performed: Yes, see critical care procedure note(s)   Procedure(s) performed:   .Critical Care Performed by: Hinda Kehr, MD Authorized by: Hinda Kehr, MD   Critical care provider statement:  Critical care time (minutes):  30   Critical care time was exclusive of:  Separately billable procedures and treating other patients   Critical care was necessary to treat or prevent imminent or life-threatening deterioration of the following conditions:  Cardiac failure (NSTEMI)   Critical care was time spent personally by me on the following activities:  Development of treatment plan with patient or surrogate, discussions with consultants, evaluation of patient's response to treatment, examination of patient, obtaining history from patient or surrogate, ordering and performing treatments and interventions, ordering and review of laboratory studies, ordering and review of radiographic studies, pulse oximetry, re-evaluation of patient's condition and review of old charts     ____________________________________________   INITIAL IMPRESSION / [place] / ED COURSE  As part of my medical decision making, I reviewed the following data within the [facility] notes reviewed and incorporated, Labs reviewed , EKG interpreted , Old EKG reviewed, Old chart reviewed, Discussed with admitting physician , Discussed with surgeon, and Notes from prior ED visits   Differential diagnosis includes, but is not limited to, dehydration with or without acute kidney injury/renal failure, orthostatic hypotension, metabolic or electrolyte abnormality, ACS, PE.  It is reassuring that the patient does not have any chest pain and has  only very brief shortness of breath when she syncopized.  However she does not meet the low risk criteria based on [place] syncope rules given that she is hypertensive currently.  I suspect the issue is primarily volume depletion/dehydration in the setting of her recent gastric bypass but I will perform a broad laboratory evaluation including electrolytes, CBC, troponin, etc.  No indication for imaging at this time.  She has difficult IV access and the IV team has been consulted but blood work was sent initially and wants a patent IV has been established she will get 1 L normal saline IV bolus.  She is in no distress and agrees with my plan.  Clinical Course as of Feb 14 736  Tue Feb 13, 2018  0345 Troponin of 0.89 seems unlikely under the circumstances.  Will redraw and send for another troponin.  Troponin I(!!): 0.89 [CF]  0346 AKI in the setting of dehydration.   Creatinine(!): 2.09 [CF]  0346 Non-specific leukocytosis.  Reassuring Hb  WBC(!): 12.8 [CF]  0440 The patient's second troponin is now gone up to 1.28.  At this point I feel we must consider this an NSTEMI.  I have updated the patient and explained her results and the need to keep her in the hospital.  I am giving her a full dose aspirin.  I am calling surgery to ask about contraindications to heparin 2 weeks after a gastric sleeve surgery.  The patient continues to have no chest pain or shortness of breath and not even any abdominal pain.  IV fluids are going and her blood pressure is up to about 734 systolic, she is still tachycardic at about 105.  Troponin I(!!): 1.28 [CF]  0451 I spoke by phone with Dr. Celine Ahr.  With the understanding that she is not a Ambulance person and that she does not know the patient personally, we discussed the relative risks and benefits of starting heparin given the concern for NSTEMI.  She is not aware of any specific absolute contraindications to heparin 2 weeks after surgery.  I feel that it is better  to err on the side of caution and go ahead and begin the heparin given the concern for  acute cardiac ischemia.  I have paged the hospitalist to discuss.   [CF]    Clinical Course User Index [CF] Hinda Kehr, MD    ____________________________________________  FINAL CLINICAL IMPRESSION(S) / ED DIAGNOSES  Final diagnoses:  NSTEMI (non-ST elevated myocardial infarction) ([place])  Elevated troponin I level  Abnormal EKG  Dehydration  Acute renal failure, unspecified acute renal failure type ([place])     MEDICATIONS GIVEN DURING THIS VISIT:  Medications  heparin ADULT infusion 100 units/mL (25000 units/258m sodium chloride 0.45%) (1,250 Units/hr Intravenous New Bag/Given 02/13/18 0547)  sodium chloride flush (NS) 0.9 % injection 10-40 mL (has no administration in time range)  sodium chloride flush (NS) 0.9 % injection 10-40 mL (has no administration in time range)  sodium chloride 0.9 % bolus 1,000 mL (1,000 mLs Intravenous New Bag/Given 02/13/18 0310)  sodium chloride 0.9 % bolus 1,000 mL (1,000 mLs Intravenous New Bag/Given 02/13/18 0600)  aspirin chewable tablet 324 mg (324 mg Oral Given 02/13/18 0439)  heparin injection 4,000 Units (4,000 Units Intravenous Given 02/13/18 0544)     ED Discharge Orders    None       Note:  This document was prepared using Dragon voice recognition software and may include unintentional dictation errors.    FHinda Kehr MD 02/13/18 [phone]

## 2018-02-13 NOTE — Progress Notes (Signed)
*  PRELIMINARY RESULTS* Echocardiogram 2D Echocardiogram has been performed.  Kristen [name] 02/13/2018, 4:03 PM

## 2018-02-14 ENCOUNTER — Inpatient Hospital Stay: Payer: BLUE CROSS/BLUE SHIELD

## 2018-02-14 DIAGNOSIS — Z8249 Family history of ischemic heart disease and other diseases of the circulatory system: Secondary | ICD-10-CM | POA: Diagnosis not present

## 2018-02-14 DIAGNOSIS — Z7984 Long term (current) use of oral hypoglycemic drugs: Secondary | ICD-10-CM | POA: Diagnosis not present

## 2018-02-14 DIAGNOSIS — I129 Hypertensive chronic kidney disease with stage 1 through stage 4 chronic kidney disease, or unspecified chronic kidney disease: Secondary | ICD-10-CM | POA: Diagnosis present

## 2018-02-14 DIAGNOSIS — D509 Iron deficiency anemia, unspecified: Secondary | ICD-10-CM | POA: Diagnosis not present

## 2018-02-14 DIAGNOSIS — N183 Chronic kidney disease, stage 3 (moderate): Secondary | ICD-10-CM | POA: Diagnosis present

## 2018-02-14 DIAGNOSIS — I214 Non-ST elevation (NSTEMI) myocardial infarction: Secondary | ICD-10-CM | POA: Diagnosis present

## 2018-02-14 DIAGNOSIS — D638 Anemia in other chronic diseases classified elsewhere: Secondary | ICD-10-CM | POA: Diagnosis present

## 2018-02-14 DIAGNOSIS — Z96641 Presence of right artificial hip joint: Secondary | ICD-10-CM | POA: Diagnosis present

## 2018-02-14 DIAGNOSIS — I2699 Other pulmonary embolism without acute cor pulmonale: Secondary | ICD-10-CM | POA: Diagnosis present

## 2018-02-14 DIAGNOSIS — E1165 Type 2 diabetes mellitus with hyperglycemia: Secondary | ICD-10-CM | POA: Diagnosis present

## 2018-02-14 DIAGNOSIS — I272 Pulmonary hypertension, unspecified: Secondary | ICD-10-CM | POA: Diagnosis present

## 2018-02-14 DIAGNOSIS — Z833 Family history of diabetes mellitus: Secondary | ICD-10-CM | POA: Diagnosis not present

## 2018-02-14 DIAGNOSIS — Z825 Family history of asthma and other chronic lower respiratory diseases: Secondary | ICD-10-CM | POA: Diagnosis not present

## 2018-02-14 DIAGNOSIS — D473 Essential (hemorrhagic) thrombocythemia: Secondary | ICD-10-CM | POA: Diagnosis not present

## 2018-02-14 DIAGNOSIS — Z9884 Bariatric surgery status: Secondary | ICD-10-CM | POA: Diagnosis not present

## 2018-02-14 DIAGNOSIS — N179 Acute kidney failure, unspecified: Secondary | ICD-10-CM | POA: Diagnosis present

## 2018-02-14 DIAGNOSIS — E782 Mixed hyperlipidemia: Secondary | ICD-10-CM | POA: Diagnosis present

## 2018-02-14 DIAGNOSIS — Z823 Family history of stroke: Secondary | ICD-10-CM | POA: Diagnosis not present

## 2018-02-14 DIAGNOSIS — Z6841 Body Mass Index (BMI) 40.0 and over, adult: Secondary | ICD-10-CM | POA: Diagnosis not present

## 2018-02-14 DIAGNOSIS — I422 Other hypertrophic cardiomyopathy: Secondary | ICD-10-CM | POA: Diagnosis present

## 2018-02-14 DIAGNOSIS — E860 Dehydration: Secondary | ICD-10-CM | POA: Diagnosis present

## 2018-02-14 DIAGNOSIS — E1122 Type 2 diabetes mellitus with diabetic chronic kidney disease: Secondary | ICD-10-CM | POA: Diagnosis present

## 2018-02-14 DIAGNOSIS — J9601 Acute respiratory failure with hypoxia: Secondary | ICD-10-CM | POA: Diagnosis present

## 2018-02-14 DIAGNOSIS — J811 Chronic pulmonary edema: Secondary | ICD-10-CM | POA: Diagnosis present

## 2018-02-14 DIAGNOSIS — I444 Left anterior fascicular block: Secondary | ICD-10-CM | POA: Diagnosis present

## 2018-02-14 HISTORY — DX: Non-ST elevation (NSTEMI) myocardial infarction: I21.4

## 2018-02-14 LAB — CBC
HCT: 33.7 % — ABNORMAL LOW (ref 36.0–46.0)
Hemoglobin: 10.3 g/dL — ABNORMAL LOW (ref 12.0–15.0)
MCH: 25.2 pg — ABNORMAL LOW (ref 26.0–34.0)
MCHC: 30.6 g/dL (ref 30.0–36.0)
MCV: 82.6 fL (ref 80.0–100.0)
Platelets: 424 10*3/uL — ABNORMAL HIGH (ref 150–400)
RBC: 4.08 MIL/uL (ref 3.87–5.11)
RDW: 18.4 % — ABNORMAL HIGH (ref 11.5–15.5)
WBC: 9.6 10*3/uL (ref 4.0–10.5)
nRBC: 0 % (ref 0.0–0.2)

## 2018-02-14 LAB — ECHOCARDIOGRAM COMPLETE
Height: 64 in
Weight: 4876.8 oz

## 2018-02-14 LAB — HEPARIN LEVEL (UNFRACTIONATED)
Heparin Unfractionated: <0.1 IU/mL — ABNORMAL LOW (ref 0.30–0.70)
Heparin Unfractionated: 0.36 IU/mL (ref 0.30–0.70)
Heparin Unfractionated: 1.61 IU/mL — ABNORMAL HIGH (ref 0.30–0.70)

## 2018-02-14 LAB — GLUCOSE, CAPILLARY
Glucose-Capillary: 187 mg/dL — ABNORMAL HIGH (ref 70–99)
Glucose-Capillary: 221 mg/dL — ABNORMAL HIGH (ref 70–99)
Glucose-Capillary: 181 mg/dL — ABNORMAL HIGH (ref 70–99)
Glucose-Capillary: 164 mg/dL — ABNORMAL HIGH (ref 70–99)

## 2018-02-14 LAB — HEPATIC FUNCTION PANEL
ALT: 174 U/L — ABNORMAL HIGH (ref 0–44)
AST: 229 U/L — ABNORMAL HIGH (ref 15–41)
Albumin: 3.5 g/dL (ref 3.5–5.0)
Alkaline Phosphatase: 121 U/L (ref 38–126)
Bilirubin, Direct: 0.4 mg/dL — ABNORMAL HIGH (ref 0.0–0.2)
Indirect Bilirubin: 0.7 mg/dL (ref 0.3–0.9)
Total Bilirubin: 1.1 mg/dL (ref 0.3–1.2)
Total Protein: 7.7 g/dL (ref 6.5–8.1)

## 2018-02-14 MED ORDER — HEPARIN BOLUS VIA INFUSION
2700.0000 [IU] | Freq: Once | INTRAVENOUS | Status: AC
  Administered 2018-02-14: 2700 [IU] via INTRAVENOUS
  Filled 2018-02-14: qty 2700

## 2018-02-14 MED ORDER — SODIUM CHLORIDE 0.9 % IV BOLUS
250.0000 mL | Freq: Once | INTRAVENOUS | Status: AC
  Administered 2018-02-14: 250 mL via INTRAVENOUS

## 2018-02-14 NOTE — Progress Notes (Signed)
ANTICOAGULATION CONSULT NOTE - Consult  Pharmacy Consult for heparin  Indication: chest pain/ACS  Allergies  Allergen Reactions  . Amoxicillin Hives    Has patient had a PCN reaction causing immediate rash, facial/tongue/throat swelling, SOB or lightheadedness with hypotension: No Has patient had a PCN reaction causing severe rash involving mucus membranes or skin necrosis: Yes Has patient had a PCN reaction that required hospitalization No Has patient had a PCN reaction occurring within the last 10 years: No If all of the above answers are "NO", then may proceed with Cephalosporin use.   . Fish Allergy Hives    Patient Measurements: Height: 5\' 4"  (162.6 cm) Weight: (!) 304 lb 12.8 oz (138.3 kg) IBW/kg (Calculated) : 54.7 Heparin Dosing Weight: 89 kg  Vital Signs: Temp: 98.6 F (37 C) (01/01 0406) Temp Source: Oral (12/31 1929) BP: 99/61 (01/01 0406) Pulse Rate: 98 (01/01 0406)  Labs: Recent Labs    02/13/18 0311  02/13/18 0906 02/13/18 1116 02/13/18 1640 02/13/18 1946 02/14/18 0307  HGB 11.0*  --   --   --   --   --  10.3*  HCT 35.7*  --   --   --   --   --  33.7*  PLT 468*  --   --   --   --   --  424*  APTT 33  --   --   --   --   --   --   LABPROT 14.6  --   --   --   --   --   --   INR 1.15  --   --   --   --   --   --   HEPARINUNFRC  --   --   --  <0.10*  --  <0.10* <0.10*  CREATININE 2.09*  --   --   --  1.93*  --   --   TROPONINI 0.89*   < > 1.51*  --  1.82* 1.72*  --    < > = values in this interval not displayed.    Estimated Creatinine Clearance: 47.4 mL/min (A) (by C-G formula based on SCr of 1.93 mg/dL (H)).   Medical History: Past Medical History:  Diagnosis Date  . Arthritis   . Diabetes ([place])    Type 2  . Hypertension   . URI (upper respiratory infection)    took antibiotic and prednisone oct 2019 resolved  . Uses contact lenses     Medications:  Scheduled:  . aspirin  81 mg Oral Q M,W,F  . calcium carbonate  1 tablet Oral TID  .  docusate sodium  100 mg Oral BID  . FLUoxetine  20 mg Oral Daily  . gabapentin  300 mg Oral BID  . insulin aspart  0-5 Units Subcutaneous QHS  . insulin aspart  0-9 Units Subcutaneous TID WC  . irbesartan  300 mg Oral Daily  . loratadine  10 mg Oral Daily  . metoprolol tartrate  25 mg Oral BID  . pantoprazole  40 mg Oral Daily  . sodium chloride flush  10-40 mL Intracatheter Q12H    Assessment: Patient admitted this am s/t having a syncopal episode s/p a gastric sleeve procedure 2 weeks ago. On arrival initial trops 0.89 >> 1.28. Troponins still elevated this afternoon. EKG showing RBBB w/ Left anterior fascicular block w/ T-wave inversion. Patient is not on any PTA anticoagulation. Being starting on a heparin drip for NSTEMI. Spoke with patient's nurse, not  aware of any stop or pause in heparin drip.  Goal of Therapy:  Heparin level 0.3-0.7 units/ml Monitor platelets by anticoagulation protocol: Yes   Plan:  01/01 @ 0300 HL < 0.10 subtherapeutic. Will rebolus w/ heparin 2700 units IV x 1 and increase rate to 1950 units/hr and will recheck HL @ 0900 hgb trending down will continue to monitor.  Tobie Lords, PharmD, BCPS Clinical Pharmacist 02/14/2018 4:29 AM

## 2018-02-14 NOTE — Progress Notes (Signed)
Patient complaining of right arm feeling tight, where midline was placed.  Patient's arm does appear slightly swollen.  Heparin and IV fluid switched to OV access in right hand.  IV team paged to assess for patency.

## 2018-02-14 NOTE — Progress Notes (Signed)
[facility] Encounter Note  Patient: Kristen [name] / Admit Date: 02/13/2018 / Date of Encounter: 02/14/2018, 7:10 AM   Subjective: Patient feeling slightly better than yesterday.  Still weak and fatigue and unable to ambulate.  Patient had an episode of syncope yesterday very similar to her syncopal episode with admission.  With this her telemetry showed lots of artifact but possible wide-complex tachycardia.  Unclear with the telemetry having so much artifact.  Echocardiogram shows normal LV systolic function with ejection fraction of 60% and pulmonary hypertension to a moderate degree.  She does have new onset EKG changes of normal sinus rhythm left axis deviation and diffuse T wave inversions with elevated troponin of 1.8 consistent with non-ST elevation myocardial infarction.  Review of Systems: Positive for: As of breath weakness fatigue dizziness syncope Negative for: Vision change, hearing change, has had it for syncope, dizziness, negative for nausea, vomiting,diarrhea, bloody stool, stomach pain, cough, congestion, diaphoresis, urinary frequency, urinary pain,skin lesions, skin rashes Others previously listed  Objective: Telemetry: Normal sinus rhythm Physical Exam: Blood pressure 99/61, pulse 98, temperature 98.6 F (37 C), resp. rate 18, height 5\' 4"  (1.626 m), weight (!) 141.3 kg, last menstrual period 01/18/2018, SpO2 95 %. Body mass index is 53.47 kg/m. General: Well developed, well nourished, in no acute distress. Head: Normocephalic, atraumatic, sclera non-icteric, no xanthomas, nares are without discharge. Neck: No apparent masses Lungs: Normal respirations with no wheezes, no rhonchi, no rales , no crackles   Heart: Regular rate and rhythm, normal S1 S2, no murmur, no rub, no gallop, PMI is normal size and placement, carotid upstroke normal without bruit, jugular venous pressure normal Abdomen: Soft, non-tender, non-distended with normoactive bowel  sounds. No hepatosplenomegaly. Abdominal aorta is normal size without bruit Extremities: 1+ edema, no clubbing, no cyanosis, no ulcers,  Peripheral: 2+ radial, 2+ femoral, 2+ dorsal pedal pulses Neuro: Alert and oriented. Moves all extremities spontaneously. Psych:  Responds to questions appropriately with a normal affect.   Intake/Output Summary (Last 24 hours) at 02/14/2018 0710 Last data filed at 02/14/2018 0600 Gross per 24 hour  Intake 3910.52 ml  Output -  Net 3910.52 ml    Inpatient Medications:  . aspirin  81 mg Oral Q M,W,F  . calcium carbonate  1 tablet Oral TID  . docusate sodium  100 mg Oral BID  . FLUoxetine  20 mg Oral Daily  . gabapentin  300 mg Oral BID  . insulin aspart  0-5 Units Subcutaneous QHS  . insulin aspart  0-9 Units Subcutaneous TID WC  . irbesartan  300 mg Oral Daily  . loratadine  10 mg Oral Daily  . metoprolol tartrate  25 mg Oral BID  . pantoprazole  40 mg Oral Daily  . sodium chloride flush  10-40 mL Intracatheter Q12H   Infusions:  . sodium chloride 100 mL/hr at 02/14/18 0106  . heparin 1,950 Units/hr (02/14/18 0436)    Labs: Recent Labs    02/13/18 0311 02/13/18 1640  NA 137 137  K 3.7 4.6  CL 104 108  CO2 18* 20*  GLUCOSE 265* 201*  BUN 39* 39*  CREATININE 2.09* 1.93*  CALCIUM 9.0 8.8*  MG 2.1 2.3   No results for input(s): AST, ALT, ALKPHOS, BILITOT, PROT, ALBUMIN in the last 72 hours. Recent Labs    02/13/18 0311 02/14/18 0307  WBC 12.8* 9.6  HGB 11.0* 10.3*  HCT 35.7* 33.7*  MCV 83.8 82.6  PLT 468* 424*   Recent Labs  02/13/18 0355 02/13/18 0906 02/13/18 1640 02/13/18 1946  TROPONINI 1.28* 1.51* 1.82* 1.72*   Invalid input(s): POCBNP No results for input(s): HGBA1C in the last 72 hours.   Weights: Filed Weights   02/13/18 0301 02/13/18 1500 02/14/18 0406  Weight: 130 kg (!) 138.3 kg (!) 141.3 kg     Radiology/Studies:  Dg Chest 2 View  Result Date: 02/13/2018 CLINICAL DATA:  Gastric sleeve surgery  performed 2 weeks ago, now weak and fatigued, syncopal episode EXAM: CHEST - 2 VIEW COMPARISON:  Chest x-ray of 12/27/2017 FINDINGS: No active infiltrate or effusion is seen. Mediastinal and hilar contours are unremarkable. The heart is within upper limits of normal. No acute bony abnormality is seen. IMPRESSION: No active cardiopulmonary disease. Electronically Signed   By: Ivar Drape M.D.   On: 02/13/2018 16:28   Ct Head Wo Contrast  Result Date: 02/13/2018 CLINICAL DATA:  Syncope.  Recent gastric bypass surgery. EXAM: CT HEAD WITHOUT CONTRAST TECHNIQUE: Contiguous axial images were obtained from the base of the skull through the vertex without intravenous contrast. COMPARISON:  None. FINDINGS: Brain: No evidence of acute infarction, hemorrhage, hydrocephalus, extra-axial collection or mass lesion/mass effect. Vascular: No hyperdense vessel or unexpected calcification. Skull: Normal. Negative for fracture or focal lesion. Sinuses/Orbits: No acute finding. Other: None. IMPRESSION: 1. Normal noncontrast head CT. Electronically Signed   By: Titus Dubin M.D.   On: 02/13/2018 16:38     Assessment and Recommendation  53 y.o. female with diabetes hypertension hyperlipidemia and left ventricular hypertrophy with pulmonary hypertension status post bariatric surgery having recurrent syncope with possible wide-complex tachycardia non-ST elevation myocardial infarction and new EKG changes consistent with a primary cardiac issue 1.  Continue supportive care for possible non-ST elevation myocardial infarction including heparin and aspirin 2.  Continue telemetry and medication management at this time including beta-blocker for possible wide-complex tachycardia 3.  Continue hypertension control with angiotensin receptor blocker without change 4.  Consider high intensity cholesterol therapy 5.  Proceed to right and left heart catheterization to assess coronary anatomy possible non-ST elevation myocardial  infarction and causes for pulmonary hypertension needing further intervention and or treatment options.  Patient understands risk and benefits of cardiac catheterization.  This includes the possibility of death stroke heart attack infection bleeding or blood clot.  She is at low risk for conscious sedation  Signed, Serafina Royals M.D. FACC

## 2018-02-14 NOTE — Progress Notes (Signed)
ANTICOAGULATION CONSULT NOTE - Consult  Pharmacy Consult for heparin  Indication: chest pain/ACS  Allergies  Allergen Reactions  . Amoxicillin Hives    Has patient had a PCN reaction causing immediate rash, facial/tongue/throat swelling, SOB or lightheadedness with hypotension: No Has patient had a PCN reaction causing severe rash involving mucus membranes or skin necrosis: Yes Has patient had a PCN reaction that required hospitalization No Has patient had a PCN reaction occurring within the last 10 years: No If all of the above answers are "NO", then may proceed with Cephalosporin use.   . Fish Allergy Hives    Patient Measurements: Height: 5\' 4"  (162.6 cm) Weight: (!) 311 lb 8 oz (141.3 kg) IBW/kg (Calculated) : 54.7 Heparin Dosing Weight: 89 kg  Vital Signs: Temp: 97.4 F (36.3 C) (01/01 0809) Temp Source: Oral (01/01 0809) BP: 90/54 (01/01 0809) Pulse Rate: 107 (01/01 0809)  Labs: Recent Labs    02/13/18 0311  02/13/18 0906  02/13/18 1640 02/13/18 1946 02/14/18 0307 02/14/18 0903  HGB 11.0*  --   --   --   --   --  10.3*  --   HCT 35.7*  --   --   --   --   --  33.7*  --   PLT 468*  --   --   --   --   --  424*  --   APTT 33  --   --   --   --   --   --   --   LABPROT 14.6  --   --   --   --   --   --   --   INR 1.15  --   --   --   --   --   --   --   HEPARINUNFRC  --   --   --    < >  --  <0.10* <0.10* 0.36  CREATININE 2.09*  --   --   --  1.93*  --   --   --   TROPONINI 0.89*   < > 1.51*  --  1.82* 1.72*  --   --    < > = values in this interval not displayed.    Estimated Creatinine Clearance: 48.1 mL/min (A) (by C-G formula based on SCr of 1.93 mg/dL (H)).   Medical History: Past Medical History:  Diagnosis Date  . Arthritis   . Diabetes ([place])    Type 2  . Hypertension   . URI (upper respiratory infection)    took antibiotic and prednisone oct 2019 resolved  . Uses contact lenses     Medications:  Scheduled:  . aspirin  81 mg Oral Q M,W,F   . calcium carbonate  1 tablet Oral TID  . docusate sodium  100 mg Oral BID  . FLUoxetine  20 mg Oral Daily  . gabapentin  300 mg Oral BID  . insulin aspart  0-5 Units Subcutaneous QHS  . insulin aspart  0-9 Units Subcutaneous TID WC  . irbesartan  300 mg Oral Daily  . loratadine  10 mg Oral Daily  . metoprolol tartrate  25 mg Oral BID  . pantoprazole  40 mg Oral Daily  . sodium chloride flush  10-40 mL Intracatheter Q12H    Assessment: Patient admitted this am s/t having a syncopal episode s/p a gastric sleeve procedure 2 weeks ago. On arrival initial trops 0.89 >> 1.28. Troponins still elevated this afternoon. EKG showing  RBBB w/ Left anterior fascicular block w/ T-wave inversion. Patient is not on any PTA anticoagulation. Being starting on a heparin drip for NSTEMI. Spoke with patient's nurse, not aware of any stop or pause in heparin drip.  Goal of Therapy:  Heparin level 0.3-0.7 units/ml Monitor platelets by anticoagulation protocol: Yes   Plan:  01/01 @ 0900 HL 0.36 and therapeutic. Will continue heparin rate 1950 units/hr and will recheck HL @ 1500. Hgb trending down will continue to monitor.  Forrest Moron, PharmD Clinical Pharmacist 02/14/2018 9:30 AM

## 2018-02-14 NOTE — Progress Notes (Signed)
[facility] [place] NAME: Laquenta Whitsell    MR#:  [id number]  DATE OF BIRTH:  [birth year]  SUBJECTIVE:  No new complaint this morning.  No chest pain.  No shortness of breath.  Resting comfortably.   REVIEW OF SYSTEMS:  ROS 10 point review of system unremarkable apart from findings mentioned above.   DRUG ALLERGIES:   Allergies  Allergen Reactions  . Amoxicillin Hives    Has patient had a PCN reaction causing immediate rash, facial/tongue/throat swelling, SOB or lightheadedness with hypotension: No Has patient had a PCN reaction causing severe rash involving mucus membranes or skin necrosis: Yes Has patient had a PCN reaction that required hospitalization No Has patient had a PCN reaction occurring within the last 10 years: No If all of the above answers are "NO", then may proceed with Cephalosporin use.   . Fish Allergy Hives   VITALS:  Blood pressure (!) 90/54, pulse (!) 107, temperature (!) 97.4 F (36.3 C), temperature source Oral, resp. rate 18, height 5\' 4"  (1.626 m), weight (!) 141.3 kg, last menstrual period 01/18/2018, SpO2 93 %. PHYSICAL EXAMINATION:  Physical Exam Constitutional:      General: She is not in acute distress.    Comments: Obese  HENT:     Head: Normocephalic and atraumatic.  Eyes:     Conjunctiva/sclera: Conjunctivae normal.     Pupils: Pupils are equal, round, and reactive to light.  Neck:     Musculoskeletal: Normal range of motion and neck supple.  Cardiovascular:     Rate and Rhythm: Normal rate and regular rhythm.  Pulmonary:     Effort: Pulmonary effort is normal. No respiratory distress.     Breath sounds: Normal breath sounds. No wheezing.  Abdominal:     General: Bowel sounds are normal.     Palpations: Abdomen is soft.     Comments: Central adiposity.  Musculoskeletal: Normal range of motion.        General: Edema present.  Skin:    General: Skin is warm and dry.  Neurological:       Mental Status: She is alert and oriented to person, place, and time.  Psychiatric:        Mood and Affect: Affect normal.        Judgment: Judgment normal.      Physical Exam  Constitutional: She is oriented to person, place, and time. No distress.  Obese  HENT:  Head: Normocephalic and atraumatic.  Eyes: Pupils are equal, round, and reactive to light. Conjunctivae are normal.  Neck: Normal range of motion. Neck supple.  Cardiovascular: Normal rate and regular rhythm.  Pulmonary/Chest: Effort normal and breath sounds normal. No respiratory distress. She has no wheezes.  Abdominal: Soft. Bowel sounds are normal.  Central adiposity.  Musculoskeletal: Normal range of motion.        General: Edema present.  Neurological: She is alert and oriented to person, place, and time.  Skin: Skin is warm and dry.  Psychiatric: Affect and judgment normal.   LABORATORY PANEL:  Female CBC Recent Labs  Lab 02/14/18 0307  WBC 9.6  HGB 10.3*  HCT 33.7*  PLT 424*   ------------------------------------------------------------------------------------------------------------------ Chemistries  Recent Labs  Lab 02/13/18 1640  NA 137  K 4.6  CL 108  CO2 20*  GLUCOSE 201*  BUN 39*  CREATININE 1.93*  CALCIUM 8.8*  MG 2.3   RADIOLOGY:  Dg Chest 2 View  Result Date: 02/13/2018 CLINICAL  DATA:  Gastric sleeve surgery performed 2 weeks ago, now weak and fatigued, syncopal episode EXAM: CHEST - 2 VIEW COMPARISON:  Chest x-ray of 12/27/2017 FINDINGS: No active infiltrate or effusion is seen. Mediastinal and hilar contours are unremarkable. The heart is within upper limits of normal. No acute bony abnormality is seen. IMPRESSION: No active cardiopulmonary disease. Electronically Signed   By: Ivar Drape M.D.   On: 02/13/2018 16:28   Ct Head Wo Contrast  Result Date: 02/13/2018 CLINICAL DATA:  Syncope.  Recent gastric bypass surgery. EXAM: CT HEAD WITHOUT CONTRAST TECHNIQUE: Contiguous axial  images were obtained from the base of the skull through the vertex without intravenous contrast. COMPARISON:  None. FINDINGS: Brain: No evidence of acute infarction, hemorrhage, hydrocephalus, extra-axial collection or mass lesion/mass effect. Vascular: No hyperdense vessel or unexpected calcification. Skull: Normal. Negative for fracture or focal lesion. Sinuses/Orbits: No acute finding. Other: None. IMPRESSION: 1. Normal noncontrast head CT. Electronically Signed   By: Titus Dubin M.D.   On: 02/13/2018 16:38   ASSESSMENT AND PLAN:   Patient is a 53 year old female who was admitted initially with diagnosis of non-ST MI elevation.  Patient subsequently had a syncopal episode for which CODE BLUE was called and had transient episode of chest compressions before patient woke up and started talking.  1.  NSTEMI: Elevated troponins and ST changes.  Patient reported to have had a transient period of unresponsiveness and CODE BLUE was called on 02/13/2018.  Patient had brief chest compressions and subsequently woke up and started talking.  No evidence of cardiac arrest.  Was felt to have had a syncopal episode.  Blood sugar was 141.  Vitals were stable. Patient seen by cardiologist.  Recommendation is to continue supportive care for possible non-ST MI elevation with heparin drip and aspirin.  Plans in place for right and left heart catheterization tomorrow if renal function continues to improve.  2.  Acute kidney injury: Secondary to dehydration.  Hydrate with intravenous fluid.  Avoid nephrotoxic agents. IV fluid hydration. Nephrology already consulted.  Follow-up on renal function in a.m.  3.  Hypertension: Controlled; continue olmesartan.  Labetalol as needed.  4.  Diabetes mellitus type 2: Hold oral hypoglycemic agents.  Sliding scale insulin while hospitalized  5.  DVT prophylaxis: Patient already on heparin drip  The patient is a full code.  Time spent on admission orders and patient care  approximately 33 minutes     All the records are reviewed and case discussed with Care Management/Social Worker. Management plans discussed with the patient, family and they are in agreement.  CODE STATUS: Full Code  More than 50% of the time was spent in counseling/coordination of care: YES  POSSIBLE D/C IN 2 DAYS, DEPENDING ON CLINICAL CONDITION.   Adelina Collard M.D on 02/14/2018 at 3:26 PM  After 6pm go to [email or web address] - Proofreader  [employer]  [facility]  Office  [phone]  CC: Primary care physician; Glendale Chard, MD  Note: This dictation was prepared with Dragon dictation along with smaller phrase technology. Any transcriptional errors that result from this process are unintentional.

## 2018-02-14 NOTE — Consult Note (Signed)
[facility]  CONSULT NOTE    Date: 02/14/2018                  Patient Name:  Kristen [name]  MRN: [id number]  DOB: Jul 08, 1965  Age / Sex: 53 y.o., female         PCP: Glendale Chard, MD                 Service Requesting Consult: Dr. Stark Jock                 Reason for Consult: Acute renal failure            History of Present Illness: Kristen [name] is a 53 y.o. black female with hypertension, diabetes mellitus type II, morbid obesity status post gastric sleeve 01/29/18 Dr. Redmond Pulling, right hip surgery, who was admitted to [facility] on 02/13/2018 for Dehydration [E86.0] Abnormal EKG [R94.31] NSTEMI (non-ST elevated myocardial infarction) ([place]) [I21.4] Elevated troponin I level [R79.89] Acute renal failure, unspecified acute renal failure type [facility]) [N17.9]  Patient had a syncopal event prior to admission. Then again yesterday where a code blue was called.   Nephrology consulted for acute renal failure. IV normal saline at 147m/hr was started.   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 Denies any hypoglycemic event or hypotension at home.    Medications: Outpatient medications: Medications Prior to Admission  Medication Sig Dispense Refill Last Dose  . aspirin 81 MG chewable tablet Chew 1 tablet (81 mg total) by mouth 2 (two) times daily. (Patient taking differently: Chew 81 mg by mouth every Monday, Wednesday, and Friday. ) 35 tablet 0 02/12/2018 at Unknown time  . calcium carbonate (TUMS - DOSED IN MG ELEMENTAL CALCIUM) 500 MG chewable tablet Chew 1 tablet by mouth 3 (three) times daily.   02/13/2018 at Unknown time  . cetirizine (ZYRTEC) 10 MG tablet Take 10 mg by mouth daily.   02/12/2018 at Unknown time  . FLUoxetine (PROZAC) 20 MG tablet Take 1 tablet (20 mg total) by mouth daily. 90 tablet 1 02/13/2018 at Unknown time  . gabapentin (NEURONTIN) 300 MG capsule Take 1 capsule (300 mg total) by mouth 2 (two) times daily. 40 capsule 0  02/13/2018 at Unknown time  . metFORMIN (GLUCOPHAGE XR) 500 MG 24 hr tablet Take 2 tabs by mouth twice a day (Patient taking differently: 500 mg daily. ) 360 tablet 1 Past Week at Unknown time  . Multiple Vitamins-Minerals (BARIATRIC MULTIVITAMINS/IRON PO) Take 1 tablet by mouth 2 (two) times daily.   02/13/2018 at Unknown time  . olmesartan-hydrochlorothiazide (BENICAR HCT) 40-25 MG tablet Take 1 tablet by mouth daily. 90 tablet 1 Past Week at Unknown time  . pantoprazole (PROTONIX) 40 MG tablet Take 1 tablet (40 mg total) by mouth daily. 90 tablet 0 02/12/2018 at Unknown time  . albuterol (PROVENTIL HFA;VENTOLIN HFA) 108 (90 Base) MCG/ACT inhaler Inhale 1-2 puffs into the lungs every 6 (six) hours as needed for wheezing or shortness of breath. 1 Inhaler 2 prn at prn  . blood glucose meter kit and supplies KIT Dispense based on patient and insurance preference. Use up to four times daily as directed. (FOR ICD-9 250.00, 250.01). 1 each 0 Taking  . ondansetron (ZOFRAN-ODT) 4 MG disintegrating tablet Take 1 tablet (4 mg total) by mouth every 6 (six) hours as needed for nausea or vomiting. 20 tablet 0 prn at prn    Current medications: Current Facility-Administered Medications  Medication Dose Route Frequency  Provider Last Rate Last Dose  . 0.9 %  sodium chloride infusion   Intravenous Continuous Hillary Bow, MD 100 mL/hr at 02/14/18 0805    . acetaminophen (TYLENOL) tablet 650 mg  650 mg Oral Q6H PRN Harrie Foreman, MD   650 mg at 02/14/18 0441   Or  . acetaminophen (TYLENOL) suppository 650 mg  650 mg Rectal Q6H PRN Harrie Foreman, MD      . albuterol (PROVENTIL) (2.5 MG/3ML) 0.083% nebulizer solution 2.5 mg  2.5 mg Nebulization Q4H PRN Harrie Foreman, MD      . aspirin chewable tablet 81 mg  81 mg Oral Q M,W,F Harrie Foreman, MD   81 mg at 02/14/18 0909  . calcium carbonate (TUMS - dosed in mg elemental calcium) chewable tablet 200 mg of elemental calcium  1 tablet Oral TID  Harrie Foreman, MD   200 mg of elemental calcium at 02/14/18 0909  . docusate sodium (COLACE) capsule 100 mg  100 mg Oral BID Harrie Foreman, MD   100 mg at 02/14/18 3474  . FLUoxetine (PROZAC) tablet 20 mg  20 mg Oral Daily Harrie Foreman, MD      . gabapentin (NEURONTIN) capsule 300 mg  300 mg Oral BID Harrie Foreman, MD   300 mg at 02/14/18 2595  . heparin ADULT infusion 100 units/mL (25000 units/228m sodium chloride 0.45%)  1,950 Units/hr Intravenous Continuous Ojie, Jude, MD 19.5 mL/hr at 02/14/18 0805 1,950 Units/hr at 02/14/18 0805  . insulin aspart (novoLOG) injection 0-5 Units  0-5 Units Subcutaneous QHS Mody, Sital, MD      . insulin aspart (novoLOG) injection 0-9 Units  0-9 Units Subcutaneous TID WC MBettey Costa MD   2 Units at 02/14/18 0909  . irbesartan (AVAPRO) tablet 300 mg  300 mg Oral Daily MBettey Costa MD   300 mg at 02/13/18 1246  . loratadine (CLARITIN) tablet 10 mg  10 mg Oral Daily DHarrie Foreman MD   10 mg at 02/14/18 06387 . metoprolol tartrate (LOPRESSOR) tablet 25 mg  25 mg Oral BID SHillary Bow MD   25 mg at 02/13/18 1514  . ondansetron (ZOFRAN) tablet 4 mg  4 mg Oral Q6H PRN DHarrie Foreman MD       Or  . ondansetron ([facility] injection 4 mg  4 mg Intravenous Q6H PRN DHarrie Foreman MD      . pantoprazole (PROTONIX) EC tablet 40 mg  40 mg Oral Daily DHarrie Foreman MD   40 mg at 02/14/18 0909  . sodium chloride flush (NS) 0.9 % injection 10-40 mL  10-40 mL Intracatheter Q12H DHarrie Foreman MD   10 mL at 02/13/18 2129  . sodium chloride flush (NS) 0.9 % injection 10-40 mL  10-40 mL Intracatheter PRN DHarrie Foreman MD          Allergies: Allergies  Allergen Reactions  . Amoxicillin Hives    Has patient had a PCN reaction causing immediate rash, facial/tongue/throat swelling, SOB or lightheadedness with hypotension: No Has patient had a PCN reaction causing severe rash involving mucus membranes or skin necrosis: Yes Has  patient had a PCN reaction that required hospitalization No Has patient had a PCN reaction occurring within the last 10 years: No If all of the above answers are "NO", then may proceed with Cephalosporin use.   . Fish Allergy Hives      Past Medical History: Past Medical History:  Diagnosis Date  .  Arthritis   . Diabetes ([place])    Type 2  . Hypertension   . URI (upper respiratory infection)    took antibiotic and prednisone oct 2019 resolved  . Uses contact lenses      Past Surgical History: Past Surgical History:  Procedure Laterality Date  . COLONOSCOPY WITH PROPOFOL N/A 03/18/2016   Procedure: COLONOSCOPY WITH PROPOFOL;  Surgeon: Carol Ada, MD;  Location: [facility] ENDOSCOPY;  Service: Endoscopy;  Laterality: N/A;  . LAPAROSCOPIC GASTRIC SLEEVE RESECTION N/A 01/29/2018   Procedure: LAPAROSCOPIC GASTRIC SLEEVE RESECTION WITH UPPER ENDO AND ERAS PATHWAY;  Surgeon: Kinsinger, Arta Bruce, MD;  Location: [facility] ORS;  Service: General;  Laterality: N/A;  . TOTAL HIP ARTHROPLASTY Right 08/04/2017   Procedure: RIGHT TOTAL HIP ARTHROPLASTY ANTERIOR APPROACH;  Surgeon: Mcarthur Rossetti, MD;  Location: [facility] ORS;  Service: Orthopedics;  Laterality: Right;     Family History: Family History  Problem Relation Age of Onset  . Hypertension Mother   . Diabetes Mother   . Hypertension Father   . Diabetes Father   . COPD Father   . Stroke Father   . Congestive Heart Failure Father      Social History: Social History   Socioeconomic History  . Marital status: Married    Spouse name: Not on file  . Number of children: Not on file  . Years of education: Not on file  . Highest education level: Not on file  Occupational History  . Not on file  Social Needs  . Financial resource strain: Not on file  . Food insecurity:    Worry: Not on file    Inability: Not on file  . Transportation needs:    Medical: Not on file    Non-medical: Not on file  Tobacco Use  . Smoking status: Never  Smoker  . Smokeless tobacco: Never Used  Substance and Sexual Activity  . Alcohol use: Not Currently    Comment: OCCASIONAL   . Drug use: No  . Sexual activity: Not on file  Lifestyle  . Physical activity:    Days per week: Not on file    Minutes per session: Not on file  . Stress: Not on file  Relationships  . Social connections:    Talks on phone: Not on file    Gets together: Not on file    Attends religious service: Not on file    Active member of club or organization: Not on file    Attends meetings of clubs or organizations: Not on file    Relationship status: Not on file  . Intimate partner violence:    Fear of current or ex partner: Not on file    Emotionally abused: Not on file    Physically abused: Not on file    Forced sexual activity: Not on file  Other Topics Concern  . Not on file  Social History Narrative  . Not on file     Review of Systems: Review of Systems  Constitutional: Negative.  Negative for chills, diaphoresis, fever, malaise/fatigue and weight loss.  HENT: Negative.  Negative for congestion, ear discharge, ear pain, hearing loss, nosebleeds, sinus pain, sore throat and tinnitus.   Eyes: Negative.  Negative for blurred vision, double vision, photophobia, pain, discharge and redness.  Respiratory: Negative.  Negative for cough, hemoptysis, sputum production, shortness of breath, wheezing and stridor.   Cardiovascular: Negative.  Negative for chest pain, palpitations, orthopnea, claudication, leg swelling and PND.  Gastrointestinal: Negative for abdominal pain, blood  in stool, constipation, diarrhea, heartburn, melena, nausea and vomiting.  Genitourinary: Negative.  Negative for dysuria, flank pain, frequency, hematuria and urgency.  Musculoskeletal: Negative.  Negative for back pain, falls, joint pain, myalgias and neck pain.  Skin: Negative.  Negative for itching and rash.  Neurological: Positive for dizziness and loss of consciousness. Negative for  tingling, tremors, sensory change, speech change, focal weakness, seizures, weakness and headaches.  Endo/Heme/Allergies: Negative.  Negative for environmental allergies and polydipsia. Does not bruise/bleed easily.  Psychiatric/Behavioral: Negative.  Negative for depression, hallucinations, memory loss, substance abuse and suicidal ideas. The patient is not nervous/anxious and does not have insomnia.     Vital Signs: Blood pressure (!) 90/54, pulse (!) 107, temperature (!) 97.4 F (36.3 C), temperature source Oral, resp. rate 18, height '5\' 4"'  (1.626 m), weight (!) 141.3 kg, last menstrual period 01/18/2018, SpO2 93 %.  Weight trends: Filed Weights   02/13/18 0301 02/13/18 1500 02/14/18 0406  Weight: 130 kg (!) 138.3 kg (!) 141.3 kg    Physical Exam: General: NAD,   Head: Normocephalic, atraumatic. Moist oral mucosal membranes  Eyes: Anicteric, PERRL  Neck: Supple, trachea midline  Lungs:  Clear to auscultation  Heart: Regular rate and rhythm  Abdomen:  Soft, nontender, obese  Extremities:  no peripheral edema.  Neurologic: Nonfocal, moving all four extremities  Skin: No lesions         Lab results: Basic Metabolic Panel: Recent Labs  Lab 02/13/18 0311 02/13/18 1640  NA 137 137  K 3.7 4.6  CL 104 108  CO2 18* 20*  GLUCOSE 265* 201*  BUN 39* 39*  CREATININE 2.09* 1.93*  CALCIUM 9.0 8.8*  MG 2.1 2.3    Liver Function Tests: No results for input(s): AST, ALT, ALKPHOS, BILITOT, PROT, ALBUMIN in the last 168 hours. No results for input(s): LIPASE, AMYLASE in the last 168 hours. No results for input(s): AMMONIA in the last 168 hours.  CBC: Recent Labs  Lab 02/13/18 0311 02/14/18 0307  WBC 12.8* 9.6  HGB 11.0* 10.3*  HCT 35.7* 33.7*  MCV 83.8 82.6  PLT 468* 424*    Cardiac Enzymes: Recent Labs  Lab 02/13/18 0311 02/13/18 0355 02/13/18 0906 02/13/18 1640 02/13/18 1946  TROPONINI 0.89* 1.28* 1.51* 1.82* 1.72*    BNP: Invalid input(s):  POCBNP  CBG: Recent Labs  Lab 02/13/18 1434 02/13/18 2103 02/14/18 0811 02/14/18 1146  GLUCAP 145* 168* 187* 28*    Microbiology: Results for orders placed or performed during the hospital encounter of 07/31/17  Surgical pcr screen     Status: None   Collection Time: 07/31/17  9:40 AM  Result Value Ref Range Status   MRSA, PCR NEGATIVE NEGATIVE Final   Staphylococcus aureus NEGATIVE NEGATIVE Final    Comment: (NOTE) The Xpert SA Assay (FDA approved for NASAL specimens in patients 14 years of age and older), is one component of a comprehensive surveillance program. It is not intended to diagnose infection nor to guide or monitor treatment. Performed at [facility], [place] [address] [place], [place] [address]     Coagulation Studies: Recent Labs    02/13/18 0311  LABPROT 14.6  INR 1.15    Urinalysis: No results for input(s): COLORURINE, LABSPEC, PHURINE, GLUCOSEU, HGBUR, BILIRUBINUR, KETONESUR, PROTEINUR, UROBILINOGEN, NITRITE, LEUKOCYTESUR in the last 72 hours.  Invalid input(s): APPERANCEUR    Imaging: Dg Chest 2 View  Result Date: 02/13/2018 CLINICAL DATA:  Gastric sleeve surgery performed 2 weeks ago, now weak and fatigued, syncopal  episode EXAM: CHEST - 2 VIEW COMPARISON:  Chest x-ray of 12/27/2017 FINDINGS: No active infiltrate or effusion is seen. Mediastinal and hilar contours are unremarkable. The heart is within upper limits of normal. No acute bony abnormality is seen. IMPRESSION: No active cardiopulmonary disease. Electronically Signed   By: Ivar Drape M.D.   On: 02/13/2018 16:28   Ct Head Wo Contrast  Result Date: 02/13/2018 CLINICAL DATA:  Syncope.  Recent gastric bypass surgery. EXAM: CT HEAD WITHOUT CONTRAST TECHNIQUE: Contiguous axial images were obtained from the base of the skull through the vertex without intravenous contrast. COMPARISON:  None. FINDINGS: Brain: No evidence of acute infarction, hemorrhage, hydrocephalus,  extra-axial collection or mass lesion/mass effect. Vascular: No hyperdense vessel or unexpected calcification. Skull: Normal. Negative for fracture or focal lesion. Sinuses/Orbits: No acute finding. Other: None. IMPRESSION: 1. Normal noncontrast head CT. Electronically Signed   By: Titus Dubin M.D.   On: 02/13/2018 16:38      Assessment & Plan: Kristen [name] is a 53 y.o. black female with hypertension, diabetes mellitus type II, morbid obesity status post gastric sleeve 01/29/18 Dr. Redmond Pulling, right hip surgery, who was admitted to [facility] on 02/13/2018 for Dehydration [E86.0] Abnormal EKG [R94.31] NSTEMI (non-ST elevated myocardial infarction) ([place]) [I21.4] Elevated troponin I level [R79.89] Acute renal failure, unspecified acute renal failure type ([place]) [N17.9]  1. Acute renal failure on chronic kidney disease stage III with proteinuria: baseline creatinine of 1.22, GFR of 59 on 01/30/18.  Chronic kidney disease secondary to diabetes and hypertension.  Acute renal failure from hypotension and possible over medication.  - Holding home regimen of olmesartan, hydrochlorothiazide - Continue IV fluids: NS at 166m/hr - Renal ultrasound pending - Repeat urine studies pending.   2. Hypotension with syncope: holding home blood pressure regimen of olmesartan and hydrochlorothiazide.  - cardiac catheterization for tomorrow. Echocardiogram with pulmonary edema. Appreciate cardiology input.  - discontinue gabapentin - hold metoprolol.   3. Diabetes mellitus type II with chronic kidney disease. On metformin at home.  Hemoglobin A1c of 8.7% on 01/24/18.  - holding metformin.  - Currently with sliding scale.   LOS: [id number]/1/202012:31 PM

## 2018-02-14 NOTE — Progress Notes (Signed)
ANTICOAGULATION CONSULT NOTE - Consult  Pharmacy Consult for heparin  Indication: chest pain/ACS  Allergies  Allergen Reactions  . Amoxicillin Hives    Has patient had a PCN reaction causing immediate rash, facial/tongue/throat swelling, SOB or lightheadedness with hypotension: No Has patient had a PCN reaction causing severe rash involving mucus membranes or skin necrosis: Yes Has patient had a PCN reaction that required hospitalization No Has patient had a PCN reaction occurring within the last 10 years: No If all of the above answers are "NO", then may proceed with Cephalosporin use.   . Fish Allergy Hives    Patient Measurements: Height: 5\' 4"  (162.6 cm) Weight: (!) 311 lb 8 oz (141.3 kg) IBW/kg (Calculated) : 54.7 Heparin Dosing Weight: 89 kg  Vital Signs: Temp: 97.8 F (36.6 C) (01/01 1917) Temp Source: Oral (01/01 1917) BP: 88/68 (01/01 1917) Pulse Rate: 101 (01/01 1917)  Labs: Recent Labs    02/13/18 0311  02/13/18 0906  02/13/18 1640 02/13/18 1946 02/14/18 0307 02/14/18 0903 02/14/18 1750  HGB 11.0*  --   --   --   --   --  10.3*  --   --   HCT 35.7*  --   --   --   --   --  33.7*  --   --   PLT 468*  --   --   --   --   --  424*  --   --   APTT 33  --   --   --   --   --   --   --   --   LABPROT 14.6  --   --   --   --   --   --   --   --   INR 1.15  --   --   --   --   --   --   --   --   HEPARINUNFRC  --   --   --    < >  --  <0.10* <0.10* 0.36 1.61*  CREATININE 2.09*  --   --   --  1.93*  --   --   --   --   TROPONINI 0.89*   < > 1.51*  --  1.82* 1.72*  --   --   --    < > = values in this interval not displayed.    Estimated Creatinine Clearance: 48.1 mL/min (A) (by C-G formula based on SCr of 1.93 mg/dL (H)).   Medical History: Past Medical History:  Diagnosis Date  . Arthritis   . Diabetes ([place])    Type 2  . Hypertension   . URI (upper respiratory infection)    took antibiotic and prednisone oct 2019 resolved  . Uses contact lenses      Medications:  Scheduled:  . aspirin  81 mg Oral Q M,W,F  . calcium carbonate  1 tablet Oral TID  . docusate sodium  100 mg Oral BID  . FLUoxetine  20 mg Oral Daily  . insulin aspart  0-5 Units Subcutaneous QHS  . insulin aspart  0-9 Units Subcutaneous TID WC  . loratadine  10 mg Oral Daily  . pantoprazole  40 mg Oral Daily  . sodium chloride flush  10-40 mL Intracatheter Q12H    Assessment: Patient admitted this am s/t having a syncopal episode s/p a gastric sleeve procedure 2 weeks ago. On arrival initial trops 0.89 >> 1.28. Troponins still elevated this afternoon.  EKG showing RBBB w/ Left anterior fascicular block w/ T-wave inversion. Patient is not on any PTA anticoagulation. Being starting on a heparin drip for NSTEMI.   01/01 @ 0900 HL 0.36  01/01 @ 1750 HL 1.61   Goal of Therapy:  Heparin level 0.3-0.7 units/ml Monitor platelets by anticoagulation protocol: Yes   Plan:  Heparin level supratherapeutic. Called RN to hold heparin infusion for 1 hour and will decrease rate to 1650 unit/hr. Hgb stable. Continue to monitor hgb/plts.   Oswald Hillock, PharmD, BCPS Clinical Pharmacist 02/14/2018 7:30 PM

## 2018-02-14 NOTE — Plan of Care (Signed)

## 2018-02-14 NOTE — Progress Notes (Signed)
Patient has not had any urine output since syncopal episode yesterday afternoon, where she had an episode of urinary incontinence.  Bladder scan revealed 275.

## 2018-02-15 ENCOUNTER — Inpatient Hospital Stay: Payer: BLUE CROSS/BLUE SHIELD

## 2018-02-15 ENCOUNTER — Encounter
Admission: EM | Disposition: A | Discharge status: Home Health | Payer: Self-pay | Source: Home / Self Care | Attending: Internal Medicine

## 2018-02-15 HISTORY — PX: RIGHT/LEFT HEART CATH AND CORONARY ANGIOGRAPHY: CATH118266

## 2018-02-15 LAB — BASIC METABOLIC PANEL
Anion gap: 10 (ref 5–15)
BUN: 47 mg/dL — ABNORMAL HIGH (ref 6–20)
CO2: 17 mmol/L — ABNORMAL LOW (ref 22–32)
Calcium: 8.7 mg/dL — ABNORMAL LOW (ref 8.9–10.3)
Chloride: 113 mmol/L — ABNORMAL HIGH (ref 98–111)
Creatinine, Ser: 1.95 mg/dL — ABNORMAL HIGH (ref 0.44–1.00)
GFR calc Af Amer: 33 mL/min — ABNORMAL LOW (ref >60)
GFR calc non Af Amer: 29 mL/min — ABNORMAL LOW (ref >60)
Glucose, Bld: 166 mg/dL — ABNORMAL HIGH (ref 70–99)
Potassium: 4.5 mmol/L (ref 3.5–5.1)
Sodium: 140 mmol/L (ref 135–145)

## 2018-02-15 LAB — CBC
HCT: 32.9 % — ABNORMAL LOW (ref 36.0–46.0)
Hemoglobin: 10.1 g/dL — ABNORMAL LOW (ref 12.0–15.0)
MCH: 25.9 pg — ABNORMAL LOW (ref 26.0–34.0)
MCHC: 30.7 g/dL (ref 30.0–36.0)
MCV: 84.4 fL (ref 80.0–100.0)
Platelets: 413 10*3/uL — ABNORMAL HIGH (ref 150–400)
RBC: 3.9 MIL/uL (ref 3.87–5.11)
RDW: 18.7 % — ABNORMAL HIGH (ref 11.5–15.5)
WBC: 10 10*3/uL (ref 4.0–10.5)
nRBC: 0 % (ref 0.0–0.2)

## 2018-02-15 LAB — GLUCOSE, CAPILLARY
Glucose-Capillary: 156 mg/dL — ABNORMAL HIGH (ref 70–99)
Glucose-Capillary: 167 mg/dL — ABNORMAL HIGH (ref 70–99)
Glucose-Capillary: 145 mg/dL — ABNORMAL HIGH (ref 70–99)
Glucose-Capillary: 134 mg/dL — ABNORMAL HIGH (ref 70–99)
Glucose-Capillary: 224 mg/dL — ABNORMAL HIGH (ref 70–99)

## 2018-02-15 LAB — PHOSPHORUS: Phosphorus: 3.7 mg/dL (ref 2.5–4.6)

## 2018-02-15 LAB — HEPARIN LEVEL (UNFRACTIONATED)
Heparin Unfractionated: 0.76 IU/mL — ABNORMAL HIGH (ref 0.30–0.70)
Heparin Unfractionated: 1.18 IU/mL — ABNORMAL HIGH (ref 0.30–0.70)
Heparin Unfractionated: 1.47 IU/mL — ABNORMAL HIGH (ref 0.30–0.70)

## 2018-02-15 LAB — MAGNESIUM: Magnesium: 2.3 mg/dL (ref 1.7–2.4)

## 2018-02-15 SURGERY — RIGHT/LEFT HEART CATH AND CORONARY ANGIOGRAPHY
Anesthesia: Moderate Sedation

## 2018-02-15 MED ORDER — ASPIRIN 81 MG PO CHEW: 81.0000 mg | CHEWABLE_TABLET | ORAL | Status: DC

## 2018-02-15 MED ORDER — MIDAZOLAM HCL 2 MG/2ML IJ SOLN
INTRAMUSCULAR | Status: AC
  Filled 2018-02-15: qty 2

## 2018-02-15 MED ORDER — SODIUM CHLORIDE 0.9 % WEIGHT BASED INFUSION: 1.0000 mL/kg/h | INTRAVENOUS | Status: DC

## 2018-02-15 MED ORDER — HEPARIN (PORCINE) IN NACL 1000-0.9 UT/500ML-% IV SOLN
INTRAVENOUS | Status: AC
  Filled 2018-02-15: qty 1000

## 2018-02-15 MED ORDER — TECHNETIUM TC 99M DIETHYLENETRIAME-PENTAACETIC ACID
33.6400 | Freq: Once | INTRAVENOUS | Status: AC | PRN
  Administered 2018-02-15: 33.64 via RESPIRATORY_TRACT

## 2018-02-15 MED ORDER — SODIUM CHLORIDE 0.9 % WEIGHT BASED INFUSION
3.0000 mL/kg/h | INTRAVENOUS | Status: AC
  Administered 2018-02-15: 3 mL/kg/h via INTRAVENOUS

## 2018-02-15 MED ORDER — IOPAMIDOL (ISOVUE-300) INJECTION 61%
INTRAVENOUS | Status: DC | PRN
  Administered 2018-02-15: 115 mL via INTRA_ARTERIAL

## 2018-02-15 MED ORDER — FENTANYL CITRATE (PF) 100 MCG/2ML IJ SOLN
INTRAMUSCULAR | Status: AC
  Filled 2018-02-15: qty 2

## 2018-02-15 MED ORDER — TECHNETIUM TO 99M ALBUMIN AGGREGATED
4.0200 | Freq: Once | INTRAVENOUS | Status: AC | PRN
  Administered 2018-02-15: 4.02 via INTRAVENOUS

## 2018-02-15 MED ORDER — SODIUM CHLORIDE 0.9 % WEIGHT BASED INFUSION
1.0000 mL/kg/h | INTRAVENOUS | Status: AC
  Administered 2018-02-15: 1 mL/kg/h via INTRAVENOUS

## 2018-02-15 MED ORDER — ASPIRIN 81 MG PO CHEW
81.0000 mg | CHEWABLE_TABLET | ORAL | Status: AC
  Administered 2018-02-15: 81 mg via ORAL
  Filled 2018-02-15: qty 1

## 2018-02-15 MED ORDER — SODIUM CHLORIDE 0.9 % WEIGHT BASED INFUSION: 3.0000 mL/kg/h | INTRAVENOUS | Status: DC

## 2018-02-15 MED ORDER — SODIUM CHLORIDE 0.9% FLUSH: 3.0000 mL | Freq: Two times a day (BID) | INTRAVENOUS | Status: DC

## 2018-02-15 MED ORDER — HEPARIN (PORCINE) 25000 UT/250ML-% IV SOLN
1300.0000 [IU]/h | INTRAVENOUS | Status: DC
  Administered 2018-02-15 (×2): 1500 [IU]/h via INTRAVENOUS
  Administered 2018-02-16: 1400 [IU]/h via INTRAVENOUS
  Filled 2018-02-15 (×2): qty 250

## 2018-02-15 MED ORDER — ACETAMINOPHEN 325 MG PO TABS: 650.0000 mg | ORAL_TABLET | ORAL | Status: DC | PRN

## 2018-02-15 MED ORDER — FENTANYL CITRATE (PF) 100 MCG/2ML IJ SOLN
INTRAMUSCULAR | Status: DC | PRN
  Administered 2018-02-15: 25 ug via INTRAVENOUS

## 2018-02-15 MED ORDER — SODIUM CHLORIDE 0.9% FLUSH: 3.0000 mL | INTRAVENOUS | Status: DC | PRN

## 2018-02-15 MED ORDER — ONDANSETRON HCL 4 MG/2ML IJ SOLN: 4.0000 mg | Freq: Four times a day (QID) | INTRAMUSCULAR | Status: DC | PRN

## 2018-02-15 MED ORDER — MIDAZOLAM HCL 2 MG/2ML IJ SOLN
INTRAMUSCULAR | Status: DC | PRN
  Administered 2018-02-15: 1 mg via INTRAVENOUS

## 2018-02-15 MED ORDER — SODIUM CHLORIDE 0.9 % WEIGHT BASED INFUSION
1.0000 mL/kg/h | INTRAVENOUS | Status: DC
  Administered 2018-02-15 (×3): 1 mL/kg/h via INTRAVENOUS

## 2018-02-15 MED ORDER — SODIUM CHLORIDE 0.9 % IV SOLN: 250.0000 mL | INTRAVENOUS | Status: DC | PRN

## 2018-02-15 MED FILL — Medication: Qty: 1 | Status: AC

## 2018-02-15 SURGICAL SUPPLY — 13 items
CATH INFINITI 5FR ANG PIGTAIL (CATHETERS) ×1 IMPLANT
CATH INFINITI 5FR JL4 (CATHETERS) ×1 IMPLANT
CATH INFINITI JR4 5F (CATHETERS) ×1 IMPLANT
CATH SWANZ 7F THERMO (CATHETERS) ×1 IMPLANT
DEVICE CLOSURE MYNXGRIP 5F (Vascular Products) ×1 IMPLANT
KIT MANI 3VAL PERCEP (MISCELLANEOUS) ×2 IMPLANT
KIT RIGHT HEART (MISCELLANEOUS) ×2 IMPLANT
NDL PERC 18GX7CM (NEEDLE) IMPLANT
NEEDLE PERC 18GX7CM (NEEDLE) ×2 IMPLANT
PACK CARDIAC CATH (CUSTOM PROCEDURE TRAY) ×2 IMPLANT
SHEATH AVANTI 5FR X 11CM (SHEATH) ×1 IMPLANT
SHEATH AVANTI 7FRX11 (SHEATH) ×1 IMPLANT
WIRE GUIDERIGHT .035X150 (WIRE) ×1 IMPLANT

## 2018-02-15 NOTE — Progress Notes (Signed)
[facility]  ROUNDING NOTE   Subjective:   Sister at bedside.   Finally urinated according to patient.   Objective:  Vital signs in last 24 hours:  Temp:  [97.8 F (36.6 C)-98.1 F (36.7 C)] 98 F (36.7 C) (01/02 0734) Pulse Rate:  [101-109] 104 (01/02 0734) Resp:  [18] 18 (01/02 0351) BP: (81-107)/(59-91) 107/91 (01/02 0734) SpO2:  [96 %-98 %] 97 % (01/02 0734) Weight:  [139.5 kg] 139.5 kg (01/02 0351)  Weight change: 1.294 kg Filed Weights   02/13/18 1500 02/14/18 0406 02/15/18 0351  Weight: (!) 138.3 kg (!) 141.3 kg (!) 139.5 kg    Intake/Output: I/O last 3 completed shifts: In: 1615.4 [P.O.:240; I.V.:1375.4] Out: -    Intake/Output this shift:  No intake/output data recorded.  Physical Exam: General: NAD,   Head: Normocephalic, atraumatic. Moist oral mucosal membranes  Eyes: Anicteric, PERRL  Neck: Supple, trachea midline  Lungs:  Clear to auscultation  Heart: Regular rate and rhythm  Abdomen:  Soft, nontender,   Extremities:  no peripheral edema.  Neurologic: Nonfocal, moving all four extremities  Skin: No lesions  Access:      Basic Metabolic Panel: Recent Labs  Lab 02/13/18 0311 02/13/18 1640 02/15/18 0620  NA 137 137 140  K 3.7 4.6 4.5  CL 104 108 113*  CO2 18* 20* 17*  GLUCOSE 265* 201* 166*  BUN 39* 39* 47*  CREATININE 2.09* 1.93* 1.95*  CALCIUM 9.0 8.8* 8.7*  MG 2.1 2.3 2.3  PHOS  --   --  3.7    Liver Function Tests: Recent Labs  Lab 02/14/18 1750  AST 229*  ALT 174*  ALKPHOS 121  BILITOT 1.1  PROT 7.7  ALBUMIN 3.5   No results for input(s): LIPASE, AMYLASE in the last 168 hours. No results for input(s): AMMONIA in the last 168 hours.  CBC: Recent Labs  Lab 02/13/18 0311 02/14/18 0307 02/15/18 0620  WBC 12.8* 9.6 10.0  HGB 11.0* 10.3* 10.1*  HCT 35.7* 33.7* 32.9*  MCV 83.8 82.6 84.4  PLT 468* 424* 413*    Cardiac Enzymes: Recent Labs  Lab 02/13/18 0311 02/13/18 0355 02/13/18 0906  02/13/18 1640 02/13/18 1946  TROPONINI 0.89* 1.28* 1.51* 1.82* 1.72*    BNP: Invalid input(s): POCBNP  CBG: Recent Labs  Lab 02/14/18 0811 02/14/18 1146 02/14/18 1731 02/14/18 2048 02/15/18 0737  GLUCAP 187* 221* 181* 164* 156*    Microbiology: Results for orders placed or performed during the hospital encounter of 07/31/17  Surgical pcr screen     Status: None   Collection Time: 07/31/17  9:40 AM  Result Value Ref Range Status   MRSA, PCR NEGATIVE NEGATIVE Final   Staphylococcus aureus NEGATIVE NEGATIVE Final    Comment: (NOTE) The Xpert SA Assay (FDA approved for NASAL specimens in patients 76 years of age and older), is one component of a comprehensive surveillance program. It is not intended to diagnose infection nor to guide or monitor treatment. Performed at [facility], [place] [address] [place], [place] [address]     Coagulation Studies: Recent Labs    02/13/18 0311  LABPROT 14.6  INR 1.15    Urinalysis: No results for input(s): COLORURINE, LABSPEC, PHURINE, GLUCOSEU, HGBUR, BILIRUBINUR, KETONESUR, PROTEINUR, UROBILINOGEN, NITRITE, LEUKOCYTESUR in the last 72 hours.  Invalid input(s): APPERANCEUR    Imaging: Dg Chest 2 View  Result Date: 02/13/2018 CLINICAL DATA:  Gastric sleeve surgery performed 2 weeks ago, now weak and fatigued, syncopal episode EXAM: CHEST -  2 VIEW COMPARISON:  Chest x-ray of 12/27/2017 FINDINGS: No active infiltrate or effusion is seen. Mediastinal and hilar contours are unremarkable. The heart is within upper limits of normal. No acute bony abnormality is seen. IMPRESSION: No active cardiopulmonary disease. Electronically Signed   By: Ivar Drape M.D.   On: 02/13/2018 16:28   Ct Head Wo Contrast  Result Date: 02/13/2018 CLINICAL DATA:  Syncope.  Recent gastric bypass surgery. EXAM: CT HEAD WITHOUT CONTRAST TECHNIQUE: Contiguous axial images were obtained from the base of the skull through the vertex without  intravenous contrast. COMPARISON:  None. FINDINGS: Brain: No evidence of acute infarction, hemorrhage, hydrocephalus, extra-axial collection or mass lesion/mass effect. Vascular: No hyperdense vessel or unexpected calcification. Skull: Normal. Negative for fracture or focal lesion. Sinuses/Orbits: No acute finding. Other: None. IMPRESSION: 1. Normal noncontrast head CT. Electronically Signed   By: Titus Dubin M.D.   On: 02/13/2018 16:38   US Renal  Result Date: 02/14/2018 CLINICAL DATA:  Acute renal failure. EXAM: RENAL / URINARY TRACT ULTRASOUND COMPLETE COMPARISON:  None. FINDINGS: Right Kidney: Renal measurements: 9.6 x 4.0 x 4.3 cm = volume: 85.7 mL . Echogenicity within normal limits. No mass or hydronephrosis visualized. Left Kidney: Renal measurements: 10.4 x 4.4 x 4.0 cm = volume: 96.9 mL. Normal renal cortical thickness and echogenicity. No hydronephrosis. Within the midpole of the left kidney there is a 1.8 x 1.9 x 1.9 cm hypoechoic masslike structure. Bladder: Appears normal for degree of bladder distention. IMPRESSION: No hydronephrosis. Indeterminate hypoechoic masslike structure within the midpole of the left kidney. Recommend dedicated evaluation with pre and post contrast-enhanced CT when patient clinically able. Electronically Signed   By: Lovey Newcomer M.D.   On: 02/14/2018 15:34     Medications:   . sodium chloride 100 mL/hr at 02/14/18 2133  . sodium chloride    . sodium chloride 1 mL/kg/hr (02/15/18 0720)  . heparin 1,500 Units/hr (02/15/18 0093)   . aspirin  81 mg Oral Q M,W,F  . calcium carbonate  1 tablet Oral TID  . docusate sodium  100 mg Oral BID  . FLUoxetine  20 mg Oral Daily  . insulin aspart  0-5 Units Subcutaneous QHS  . insulin aspart  0-9 Units Subcutaneous TID WC  . loratadine  10 mg Oral Daily  . pantoprazole  40 mg Oral Daily  . sodium chloride flush  10-40 mL Intracatheter Q12H  . sodium chloride flush  3 mL Intravenous Q12H   sodium chloride,  acetaminophen **OR** acetaminophen, albuterol, ondansetron **OR** ondansetron (ZOFRAN) IV, sodium chloride flush, sodium chloride flush  Assessment/ Plan:  Ms. MIANGEL FLOM is a 53 y.o. black female with hypertension, diabetes mellitus type II, morbid obesity status post gastric sleeve 01/29/18 Dr. Redmond Pulling, right hip surgery, who was admitted to [facility] on 02/13/2018 for NSTEMI   1. Acute renal failure on chronic kidney disease stage III with proteinuria: baseline creatinine of 1.22, GFR of 59 on 01/30/18.  Chronic kidney disease secondary to diabetes and hypertension.  Acute renal failure from hypotension and possible over medication.  - Holding home regimen of olmesartan, hydrochlorothiazide - Continue IV fluids: NS at 141mL/hr - Renal ultrasound reviewed.  - Repeat urine studies pending.   2. Hypotension with syncope:107/91 this morning.  holding home blood pressure regimen of olmesartan and hydrochlorothiazide.  - cardiac catheterization for later today. Echocardiogram with pulmonary edema. Appreciate cardiology input.  - discontinued gabapentin - holding metoprolol.   3. Diabetes mellitus type II with chronic kidney  disease. On metformin at home.  Hemoglobin A1c of 8.7% on 01/24/18.  - holding metformin.  - Currently with sliding scale.   4. Left renal mass: incidental finding on ultrasound  - Check CT without contrast.    LOS: 2 Pearlena Ow 1/2/20208:40 AM

## 2018-02-15 NOTE — Progress Notes (Signed)
Pt. Taken to VQ Scan post Ultrasound of bilat. LE. Spoke with Dr. Nehemiah Massed on phone re: pt. Status. Dr. Nehemiah Massed then went to VQ scan to see pt. And assess her. Called floor and updated pt. RN "Naaman Plummer.". Pt. Groin stable without any complications at present. VSS.

## 2018-02-15 NOTE — Progress Notes (Signed)
[facility] Encounter Note  Patient: Kristen [name] / Admit Date: 02/13/2018 / Date of Encounter: 02/15/2018, 2:08 PM   Subjective: Patient feeling slightly better than yesterday.  Still weak and fatigue  .  Patient had an episode of syncope on admission and then early on in her hospitalization.  Since heparinization she has not had any further issues.  With this her telemetry showed lots of artifact but possible wide-complex tachycardia.  Unclear with the telemetry having so much artifact.  Echocardiogram shows normal LV systolic function with ejection fraction of 60% and pulmonary hypertension to a moderate degree.  She does have new onset EKG changes of normal sinus rhythm left axis deviation and diffuse T wave inversions with elevated troponin of 1.8 consistent with non-ST elevation myocardial infarction.  Additionally the patient has a very high fibrinogen primitive of 52 Susy Frizzle consistent with pulmonary embolism although CT angiogram was not ordered early on. Cardiac catheterization shows normal LV systolic function and hyperdynamic in nature with ejection fraction of 70% consistent with hypertrophic cardiomyopathy Normal coronary arteries Significantly elevated pulmonary pressures possibly consistent with pulmonary embolism Review of Systems: Positive for: Shortness of breath  negative for: Vision change, hearing change, has had it for syncope, dizziness, negative for nausea, vomiting,diarrhea, bloody stool, stomach pain, cough, congestion, diaphoresis, urinary frequency, urinary pain,skin lesions, skin rashes Others previously listed  Objective: Telemetry: Normal sinus rhythm Physical Exam: Blood pressure 122/76, pulse (!) 112, temperature 98 F (36.7 C), temperature source Oral, resp. rate 18, height 5\' 4"  (1.626 m), weight (!) 137.9 kg, last menstrual period 01/18/2018, SpO2 95 %. Body mass index is 52.18 kg/m. General: Well developed, well nourished, in no  acute distress. Head: Normocephalic, atraumatic, sclera non-icteric, no xanthomas, nares are without discharge. Neck: No apparent masses Lungs: Normal respirations with no wheezes, no rhonchi, no rales , no crackles   Heart: Regular rate and rhythm, normal S1 S2, no murmur, no rub, no gallop, PMI is normal size and placement, carotid upstroke normal without bruit, jugular venous pressure normal Abdomen: Soft, non-tender, non-distended with normoactive bowel sounds. No hepatosplenomegaly. Abdominal aorta is normal size without bruit Extremities: 1+ edema, no clubbing, no cyanosis, no ulcers,  Peripheral: 2+ radial, 2+ femoral, 2+ dorsal pedal pulses Neuro: Alert and oriented. Moves all extremities spontaneously. Psych:  Responds to questions appropriately with a normal affect.  No intake or output data in the 24 hours ending 02/15/18 1408  Inpatient Medications:  . [MAR Hold] aspirin  81 mg Oral Q M,W,F  . [MAR Hold] calcium carbonate  1 tablet Oral TID  . [MAR Hold] docusate sodium  100 mg Oral BID  . [MAR Hold] FLUoxetine  20 mg Oral Daily  . [MAR Hold] insulin aspart  0-5 Units Subcutaneous QHS  . [MAR Hold] insulin aspart  0-9 Units Subcutaneous TID WC  . [MAR Hold] loratadine  10 mg Oral Daily  . [MAR Hold] pantoprazole  40 mg Oral Daily  . [MAR Hold] sodium chloride flush  10-40 mL Intracatheter Q12H  . sodium chloride flush  3 mL Intravenous Q12H   Infusions:  . sodium chloride 100 mL/hr at 02/14/18 2133  . sodium chloride    . sodium chloride 1 mL/kg/hr (02/15/18 1051)  . heparin 1,500 Units/hr (02/15/18 1052)    Labs: Recent Labs    02/13/18 1640 02/15/18 0620  NA 137 140  K 4.6 4.5  CL 108 113*  CO2 20* 17*  GLUCOSE 201* 166*  BUN 39* 47*  CREATININE 1.93* 1.95*  CALCIUM 8.8* 8.7*  MG 2.3 2.3  PHOS  --  3.7   Recent Labs    02/14/18 1750  AST 229*  ALT 174*  ALKPHOS 121  BILITOT 1.1  PROT 7.7  ALBUMIN 3.5   Recent Labs    02/14/18 0307  02/15/18 0620  WBC 9.6 10.0  HGB 10.3* 10.1*  HCT 33.7* 32.9*  MCV 82.6 84.4  PLT 424* 413*   Recent Labs    02/13/18 0355 02/13/18 0906 02/13/18 1640 02/13/18 1946  TROPONINI 1.28* 1.51* 1.82* 1.72*   Invalid input(s): POCBNP No results for input(s): HGBA1C in the last 72 hours.   Weights: Filed Weights   02/14/18 0406 02/15/18 0351 02/15/18 1250  Weight: (!) 141.3 kg (!) 139.5 kg (!) 137.9 kg     Radiology/Studies:  Dg Chest 2 View  Result Date: 02/13/2018 CLINICAL DATA:  Gastric sleeve surgery performed 2 weeks ago, now weak and fatigued, syncopal episode EXAM: CHEST - 2 VIEW COMPARISON:  Chest x-ray of 12/27/2017 FINDINGS: No active infiltrate or effusion is seen. Mediastinal and hilar contours are unremarkable. The heart is within upper limits of normal. No acute bony abnormality is seen. IMPRESSION: No active cardiopulmonary disease. Electronically Signed   By: Ivar Drape M.D.   On: 02/13/2018 16:28   Ct Head Wo Contrast  Result Date: 02/13/2018 CLINICAL DATA:  Syncope.  Recent gastric bypass surgery. EXAM: CT HEAD WITHOUT CONTRAST TECHNIQUE: Contiguous axial images were obtained from the base of the skull through the vertex without intravenous contrast. COMPARISON:  None. FINDINGS: Brain: No evidence of acute infarction, hemorrhage, hydrocephalus, extra-axial collection or mass lesion/mass effect. Vascular: No hyperdense vessel or unexpected calcification. Skull: Normal. Negative for fracture or focal lesion. Sinuses/Orbits: No acute finding. Other: None. IMPRESSION: 1. Normal noncontrast head CT. Electronically Signed   By: Titus Dubin M.D.   On: 02/13/2018 16:38   US Renal  Result Date: 02/14/2018 CLINICAL DATA:  Acute renal failure. EXAM: RENAL / URINARY TRACT ULTRASOUND COMPLETE COMPARISON:  None. FINDINGS: Right Kidney: Renal measurements: 9.6 x 4.0 x 4.3 cm = volume: 85.7 mL . Echogenicity within normal limits. No mass or hydronephrosis visualized. Left Kidney:  Renal measurements: 10.4 x 4.4 x 4.0 cm = volume: 96.9 mL. Normal renal cortical thickness and echogenicity. No hydronephrosis. Within the midpole of the left kidney there is a 1.8 x 1.9 x 1.9 cm hypoechoic masslike structure. Bladder: Appears normal for degree of bladder distention. IMPRESSION: No hydronephrosis. Indeterminate hypoechoic masslike structure within the midpole of the left kidney. Recommend dedicated evaluation with pre and post contrast-enhanced CT when patient clinically able. Electronically Signed   By: Lovey Newcomer M.D.   On: 02/14/2018 15:34     Assessment and Recommendation  53 y.o. female with diabetes hypertension hyperlipidemia and left ventricular hypertrophy with pulmonary hypertension status post bariatric surgery having recurrent syncope with possible wide-complex tachycardia non-ST elevation myocardial infarction and new EKG changes now completely resolved with appropriate medication management including hydration and heparin.   Cardiac catheterization showing hypertrophic cardiomyopathy with hyperdynamic ejection fraction of 70% normal coronary arteries and significantly elevated pulmonary pressures 1.  Restart heparin for further treatment of possible pulmonary embolism and significant elevated pulmonary pressures with other events as above 2.  Continue telemetry and medication management at this time including beta-blocker for possible wide-complex tachycardia 3.  Further evaluation of possibility of pulmonary embolism either with a CT angiogram and or VQ scan 4.  Consider high intensity cholesterol therapy  5.  No further cardiac intervention at this time due to normal coronary arteries 6.  Further consideration of beta-blocker for hypertension control, treatment of hypertrophic cardiomyopathy, and tachycardia Signed, Serafina Royals M.D. FACC

## 2018-02-15 NOTE — Progress Notes (Signed)
ANTICOAGULATION CONSULT NOTE - Consult  Pharmacy Consult for heparin  Indication: chest pain/ACS  Allergies  Allergen Reactions  . Amoxicillin Hives    Has patient had a PCN reaction causing immediate rash, facial/tongue/throat swelling, SOB or lightheadedness with hypotension: No Has patient had a PCN reaction causing severe rash involving mucus membranes or skin necrosis: Yes Has patient had a PCN reaction that required hospitalization No Has patient had a PCN reaction occurring within the last 10 years: No If all of the above answers are "NO", then may proceed with Cephalosporin use.   . Fish Allergy Hives    Patient Measurements: Height: 5\' 4"  (162.6 cm) Weight: (!) 311 lb 8 oz (141.3 kg) IBW/kg (Calculated) : 54.7 Heparin Dosing Weight: 89 kg  Vital Signs: Temp: 97.8 F (36.6 C) (01/01 1917) Temp Source: Oral (01/01 1917) BP: 88/68 (01/01 1917) Pulse Rate: 101 (01/01 1917)  Labs: Recent Labs    02/13/18 0311  02/13/18 0906  02/13/18 1640 02/13/18 1946 02/14/18 0307 02/14/18 0903 02/14/18 1750 02/15/18 0018  HGB 11.0*  --   --   --   --   --  10.3*  --   --   --   HCT 35.7*  --   --   --   --   --  33.7*  --   --   --   PLT 468*  --   --   --   --   --  424*  --   --   --   APTT 33  --   --   --   --   --   --   --   --   --   LABPROT 14.6  --   --   --   --   --   --   --   --   --   INR 1.15  --   --   --   --   --   --   --   --   --   HEPARINUNFRC  --   --   --    < >  --  <0.10* <0.10* 0.36 1.61* 1.47*  CREATININE 2.09*  --   --   --  1.93*  --   --   --   --   --   TROPONINI 0.89*   < > 1.51*  --  1.82* 1.72*  --   --   --   --    < > = values in this interval not displayed.    Estimated Creatinine Clearance: 48.1 mL/min (A) (by C-G formula based on SCr of 1.93 mg/dL (H)).   Medical History: Past Medical History:  Diagnosis Date  . Arthritis   . Diabetes ([place])    Type 2  . Hypertension   . URI (upper respiratory infection)    took antibiotic  and prednisone oct 2019 resolved  . Uses contact lenses     Medications:  Scheduled:  . aspirin  81 mg Oral Q M,W,F  . aspirin  81 mg Oral Pre-Cath  . calcium carbonate  1 tablet Oral TID  . docusate sodium  100 mg Oral BID  . FLUoxetine  20 mg Oral Daily  . insulin aspart  0-5 Units Subcutaneous QHS  . insulin aspart  0-9 Units Subcutaneous TID WC  . loratadine  10 mg Oral Daily  . pantoprazole  40 mg Oral Daily  . sodium chloride flush  10-40 mL  Intracatheter Q12H  . sodium chloride flush  3 mL Intravenous Q12H    Assessment: Patient admitted this am s/t having a syncopal episode s/p a gastric sleeve procedure 2 weeks ago. On arrival initial trops 0.89 >> 1.28. Troponins still elevated this afternoon. EKG showing RBBB w/ Left anterior fascicular block w/ T-wave inversion. Patient is not on any PTA anticoagulation. Being starting on a heparin drip for NSTEMI.   01/01 @ 0900 HL 0.36  01/01 @ 1750 HL 1.61   Goal of Therapy:  Heparin level 0.3-0.7 units/ml Monitor platelets by anticoagulation protocol: Yes   Plan:  01/02 @ 0014 HL 1.47 supratherapeutic. Will hold drip for 2 hours and restart @ 1500 units/hr and will recheck HL @ 1200, will f/u w/ CBC w/ am labs.   Tobie Lords, PharmD, BCPS Clinical Pharmacist 02/15/2018 3:46 AM

## 2018-02-15 NOTE — Progress Notes (Signed)
Pt. Taken to Ultrasound now via bed, with RN , on telemetry. Stable for tx. Right groin stable without any complications at site.

## 2018-02-15 NOTE — Progress Notes (Signed)
[place] [facility] [place] NAME: Kristen [name]    MR#:  [id number]  DATE OF BIRTH:  1965-05-21  SUBJECTIVE:  No new complaint this morning.  No chest pain.  No shortness of breath.  Resting comfortably. No fevers.  REVIEW OF SYSTEMS:  Review of Systems  Constitutional: Negative for chills, fever and weight loss.  HENT: Negative for hearing loss and tinnitus.   Eyes: Negative for blurred vision and double vision.  Respiratory: Negative for cough and hemoptysis.   Cardiovascular: Negative for chest pain.  Gastrointestinal: Negative for heartburn.  Genitourinary: Negative for dysuria and urgency.  Musculoskeletal: Negative for myalgias and neck pain.       Reported some chest discomfort at site of CPR done previously.  Skin: Negative for itching and rash.  Neurological: Negative for dizziness and headaches.  Psychiatric/Behavioral: Negative for depression and suicidal ideas.    [name]   DRUG ALLERGIES:   Allergies  Allergen Reactions  . Amoxicillin Hives    Has patient had a PCN reaction causing immediate rash, facial/tongue/throat swelling, SOB or lightheadedness with hypotension: No Has patient had a PCN reaction causing severe rash involving mucus membranes or skin necrosis: Yes Has patient had a PCN reaction that required hospitalization No Has patient had a PCN reaction occurring within the last 10 years: No If all of the above answers are "NO", then may proceed with Cephalosporin use.   . Fish Allergy Hives   VITALS:  Blood pressure 95/70, pulse (!) 103, temperature 98 F (36.7 C), temperature source Oral, resp. rate 18, height 5\' 4"  (1.626 m), weight (!) 137.9 kg, last menstrual period 01/18/2018, SpO2 97 %. PHYSICAL EXAMINATION:     Physical Exam  Constitutional: She is oriented to person, place, and time. No distress.  Obese  HENT:  Head: Normocephalic and atraumatic.  Eyes: Pupils are equal, round, and reactive to  light. Conjunctivae are normal.  Neck: Normal range of motion. Neck supple.  Cardiovascular: Normal rate and regular rhythm.  Pulmonary/Chest: Effort normal and breath sounds normal. No respiratory distress. She has no wheezes.  Abdominal: Soft. Bowel sounds are normal.  Central adiposity.  Musculoskeletal: Normal range of motion.        General: Edema present.  Neurological: She is alert and oriented to person, place, and time.  Skin: Skin is warm and dry.  Psychiatric: Affect and judgment normal.   LABORATORY PANEL:  Female CBC Recent Labs  Lab 02/15/18 0620  WBC 10.0  HGB 10.1*  HCT 32.9*  PLT 413*   ------------------------------------------------------------------------------------------------------------------ Chemistries  Recent Labs  Lab 02/14/18 1750 02/15/18 0620  NA  --  140  K  --  4.5  CL  --  113*  CO2  --  17*  GLUCOSE  --  166*  BUN  --  47*  CREATININE  --  1.95*  CALCIUM  --  8.7*  MG  --  2.3  AST 229*  --   ALT 174*  --   ALKPHOS 121  --   BILITOT 1.1  --    RADIOLOGY:  No results found. ASSESSMENT AND PLAN:   Patient is a 53 year old female who was admitted initially with diagnosis of non-ST MI elevation.  Patient subsequently had a syncopal episode for which CODE BLUE was called and had transient episode of chest compressions before patient woke up and started talking.  1.  NSTEMI: Elevated troponins and ST changes.  Patient reported to have had a  transient period of unresponsiveness and CODE BLUE was called on 02/13/2018.  Patient had brief chest compressions and subsequently woke up and started talking.  No evidence of cardiac arrest.  Was felt to have had a syncopal episode.  Blood sugar was 141.  Vitals were stable. Patient seen by cardiologist.  Recommendation is to continue supportive care for possible non-ST MI elevation with heparin drip and aspirin. Patient had cardiac catheterization done today by cardiologist showing hypertrophic  cardiomyopathy with hyperdynamic ejection fraction of 70% normal coronary arteries and significantly elevated pulmonary pressures.  Recommendation is to continue heparin while undergoing further evaluation for possible pulmonary embolism.  Due to patient having cardiac catheterization today with contrast exposure, will not do CTA chest to avoid acute kidney injury.  Bilateral lower extremity venous Doppler ultrasound already ordered.  Requested for VQ scan. Continue telemetry and medication management at this time including beta-blocker for possible wide-complex tachycardia  2.  Acute kidney injury: Secondary to dehydration.  Hydrate with intravenous fluid.  Avoid nephrotoxic agents. Continue IV fluid hydration.  Renal function remained stable. Nephrology already consulted.  Follow-up on renal function in a.m.  3.  Hypertension: Controlled; continue olmesartan.  Labetalol as needed.  4.  Diabetes mellitus type 2: Hold oral hypoglycemic agents.  Sliding scale insulin while hospitalized  5.  DVT prophylaxis: Patient already on heparin drip  The patient is a full code.  Time spent on admission orders and patient care approximately 34 minutes  Management plans discussed with the patient, family and they are in agreement.  CODE STATUS: Full Code  More than 50% of the time was spent in counseling/coordination of care: YES  POSSIBLE D/C IN 1-2 DAYS, DEPENDING ON CLINICAL CONDITION.   Kristen [name] M.D on 02/15/2018 at 3:47 PM  After 6pm go to [email or web address] - Proofreader  [facility]  Office  [phone]  CC: Primary care physician; Kristen Chard, MD  Note: This dictation was prepared with Dragon dictation along with smaller phrase technology. Any transcriptional errors that result from this process are unintentional.

## 2018-02-15 NOTE — Progress Notes (Signed)
Dr. Nehemiah Massed at bedside, speaking with pt. MD wishes to restart heparin gtt at 15:00 today.. Dr. Nehemiah Massed texting primary MD to suggest ordering an Ultrasound of pt. Legs. Pt. Aware and agreeable.

## 2018-02-15 NOTE — Progress Notes (Addendum)
ANTICOAGULATION CONSULT NOTE - Consult  Pharmacy Consult for heparin  Indication: DVT/PE (possible)  Allergies  Allergen Reactions  . Amoxicillin Hives    Has patient had a PCN reaction causing immediate rash, facial/tongue/throat swelling, SOB or lightheadedness with hypotension: No Has patient had a PCN reaction causing severe rash involving mucus membranes or skin necrosis: Yes Has patient had a PCN reaction that required hospitalization No Has patient had a PCN reaction occurring within the last 10 years: No If all of the above answers are "NO", then may proceed with Cephalosporin use.   . Fish Allergy Hives    Patient Measurements: Height: 5\' 4"  (162.6 cm) Weight: (!) 304 lb (137.9 kg) IBW/kg (Calculated) : 54.7 Heparin Dosing Weight: 89 kg  Vital Signs: Temp: 98 F (36.7 C) (01/02 1250) Temp Source: Oral (01/02 1250) BP: 95/72 (01/02 1415) Pulse Rate: 105 (01/02 1415)  Labs: Recent Labs    02/13/18 0311  02/13/18 0906  02/13/18 1640 02/13/18 1946 02/14/18 0307 02/14/18 0903 02/14/18 1750 02/15/18 0018 02/15/18 0620  HGB 11.0*  --   --   --   --   --  10.3*  --   --   --  10.1*  HCT 35.7*  --   --   --   --   --  33.7*  --   --   --  32.9*  PLT 468*  --   --   --   --   --  424*  --   --   --  413*  APTT 33  --   --   --   --   --   --   --   --   --   --   LABPROT 14.6  --   --   --   --   --   --   --   --   --   --   INR 1.15  --   --   --   --   --   --   --   --   --   --   HEPARINUNFRC  --   --   --    < >  --  <0.10* <0.10* 0.36 1.61* 1.47*  --   CREATININE 2.09*  --   --   --  1.93*  --   --   --   --   --  1.95*  TROPONINI 0.89*   < > 1.51*  --  1.82* 1.72*  --   --   --   --   --    < > = values in this interval not displayed.    Estimated Creatinine Clearance: 46.9 mL/min (A) (by C-G formula based on SCr of 1.95 mg/dL (H)).   Medical History: Past Medical History:  Diagnosis Date  . Arthritis   . Diabetes ([place])    Type 2  . Hypertension    . URI (upper respiratory infection)    took antibiotic and prednisone oct 2019 resolved  . Uses contact lenses     Medications:  Scheduled:  . [MAR Hold] aspirin  81 mg Oral Q M,W,F  . [MAR Hold] calcium carbonate  1 tablet Oral TID  . [MAR Hold] docusate sodium  100 mg Oral BID  . [MAR Hold] FLUoxetine  20 mg Oral Daily  . [MAR Hold] insulin aspart  0-5 Units Subcutaneous QHS  . [MAR Hold] insulin aspart  0-9 Units Subcutaneous TID WC  . [  MAR Hold] loratadine  10 mg Oral Daily  . [MAR Hold] pantoprazole  40 mg Oral Daily  . [MAR Hold] sodium chloride flush  10-40 mL Intracatheter Q12H  . sodium chloride flush  3 mL Intravenous Q12H    Assessment: Patient admitted this am s/t having a syncopal episode s/p a gastric sleeve procedure 2 weeks ago. On arrival initial trops 0.89 >> 1.28. Troponins still elevated this afternoon. EKG showing RBBB w/ Left anterior fascicular block w/ T-wave inversion. Patient is not on any PTA anticoagulation. Being starting on a heparin drip for NSTEMI.   01/01 @ 0900 HL 0.36  01/01 @ 1750 HL 1.61  01/02 @ 0014 HL 1.47  Goal of Therapy:  Heparin level 0.3-0.7 units/ml Monitor platelets by anticoagulation protocol: Yes   Plan:  01/02 - Consult to restart heparin drip @ 1500  -  Will begin at previously adjusted rate of 1500units/hr - no bolus per cards post cath - recheck HL @ 2100 -  will f/u w/ CBC w/ am labs.   Lu Duffel, PharmD, BCPS Clinical Pharmacist 02/15/2018 2:44 PM

## 2018-02-16 ENCOUNTER — Encounter: Payer: Self-pay | Admitting: Internal Medicine

## 2018-02-16 LAB — BASIC METABOLIC PANEL
Anion gap: 9 (ref 5–15)
BUN: 42 mg/dL — ABNORMAL HIGH (ref 6–20)
CO2: 16 mmol/L — ABNORMAL LOW (ref 22–32)
Calcium: 8.7 mg/dL — ABNORMAL LOW (ref 8.9–10.3)
Chloride: 114 mmol/L — ABNORMAL HIGH (ref 98–111)
Creatinine, Ser: 1.73 mg/dL — ABNORMAL HIGH (ref 0.44–1.00)
GFR calc Af Amer: 39 mL/min — ABNORMAL LOW (ref >60)
GFR calc non Af Amer: 33 mL/min — ABNORMAL LOW (ref >60)
Glucose, Bld: 187 mg/dL — ABNORMAL HIGH (ref 70–99)
Potassium: 4.5 mmol/L (ref 3.5–5.1)
Sodium: 139 mmol/L (ref 135–145)

## 2018-02-16 LAB — MAGNESIUM: Magnesium: 2.3 mg/dL (ref 1.7–2.4)

## 2018-02-16 LAB — GLUCOSE, CAPILLARY
Glucose-Capillary: 166 mg/dL — ABNORMAL HIGH (ref 70–99)
Glucose-Capillary: 168 mg/dL — ABNORMAL HIGH (ref 70–99)
Glucose-Capillary: 170 mg/dL — ABNORMAL HIGH (ref 70–99)
Glucose-Capillary: 172 mg/dL — ABNORMAL HIGH (ref 70–99)
Glucose-Capillary: 212 mg/dL — ABNORMAL HIGH (ref 70–99)

## 2018-02-16 LAB — CBC
HCT: 32.2 % — ABNORMAL LOW (ref 36.0–46.0)
Hemoglobin: 10 g/dL — ABNORMAL LOW (ref 12.0–15.0)
MCH: 25.7 pg — ABNORMAL LOW (ref 26.0–34.0)
MCHC: 31.1 g/dL (ref 30.0–36.0)
MCV: 82.8 fL (ref 80.0–100.0)
Platelets: 442 10*3/uL — ABNORMAL HIGH (ref 150–400)
RBC: 3.89 MIL/uL (ref 3.87–5.11)
RDW: 18.9 % — ABNORMAL HIGH (ref 11.5–15.5)
WBC: 9.6 10*3/uL (ref 4.0–10.5)
nRBC: 0.3 % — ABNORMAL HIGH (ref 0.0–0.2)

## 2018-02-16 LAB — PHOSPHORUS: Phosphorus: 4 mg/dL (ref 2.5–4.6)

## 2018-02-16 LAB — HEPARIN LEVEL (UNFRACTIONATED)
Heparin Unfractionated: 0.63 IU/mL (ref 0.30–0.70)
Heparin Unfractionated: 0.77 IU/mL — ABNORMAL HIGH (ref 0.30–0.70)

## 2018-02-16 MED ORDER — APIXABAN 5 MG PO TABS
10.0000 mg | ORAL_TABLET | Freq: Two times a day (BID) | ORAL | Status: DC
  Administered 2018-02-16 – 2018-02-19 (×6): 10 mg via ORAL
  Filled 2018-02-16 (×6): qty 2

## 2018-02-16 MED ORDER — APIXABAN 5 MG PO TABS: 5.0000 mg | ORAL_TABLET | Freq: Two times a day (BID) | ORAL | Status: DC

## 2018-02-16 MED ORDER — PREMIER PROTEIN SHAKE: 11.0000 [oz_av] | Freq: Two times a day (BID) | ORAL | Status: DC

## 2018-02-16 NOTE — Progress Notes (Addendum)
ANTICOAGULATION CONSULT NOTE - Consult  Pharmacy Consult for heparin  Indication: DVT/PE (possible)  Allergies  Allergen Reactions  . Amoxicillin Hives    Has patient had a PCN reaction causing immediate rash, facial/tongue/throat swelling, SOB or lightheadedness with hypotension: No Has patient had a PCN reaction causing severe rash involving mucus membranes or skin necrosis: Yes Has patient had a PCN reaction that required hospitalization No Has patient had a PCN reaction occurring within the last 10 years: No If all of the above answers are "NO", then may proceed with Cephalosporin use.   . Fish Allergy Hives    Patient Measurements: Height: 5\' 4"  (162.6 cm) Weight: (!) 319 lb 8 oz (144.9 kg) IBW/kg (Calculated) : 54.7 Heparin Dosing Weight: 89 kg  Vital Signs: Temp: 97.6 F (36.4 C) (01/03 0827) Temp Source: Oral (01/03 0827) BP: 116/73 (01/03 0827) Pulse Rate: 108 (01/03 0827)  Labs: Recent Labs    02/13/18 1640 02/13/18 1946  02/14/18 0307  02/15/18 0620 02/15/18 1206 02/15/18 2258 02/16/18 0816  HGB  --   --    < > 10.3*  --  10.1*  --   --  10.0*  HCT  --   --   --  33.7*  --  32.9*  --   --  32.2*  PLT  --   --   --  424*  --  413*  --   --  442*  HEPARINUNFRC  --  <0.10*  --  <0.10*   < >  --  1.18* 0.76* 0.63  CREATININE 1.93*  --   --   --   --  1.95*  --   --  1.73*  TROPONINI 1.82* 1.72*  --   --   --   --   --   --   --    < > = values in this interval not displayed.    Estimated Creatinine Clearance: 54.5 mL/min (A) (by C-G formula based on SCr of 1.73 mg/dL (H)).   Medical History: Past Medical History:  Diagnosis Date  . Arthritis   . Diabetes ([place])    Type 2  . Hypertension   . URI (upper respiratory infection)    took antibiotic and prednisone oct 2019 resolved  . Uses contact lenses     Medications:  Scheduled:  . aspirin  81 mg Oral Q M,W,F  . calcium carbonate  1 tablet Oral TID  . docusate sodium  100 mg Oral BID  .  FLUoxetine  20 mg Oral Daily  . insulin aspart  0-5 Units Subcutaneous QHS  . insulin aspart  0-9 Units Subcutaneous TID WC  . loratadine  10 mg Oral Daily  . pantoprazole  40 mg Oral Daily  . protein supplement shake  11 oz Oral BID BM  . sodium chloride flush  10-40 mL Intracatheter Q12H    Assessment: Patient admitted this am s/t having a syncopal episode s/p a gastric sleeve procedure 2 weeks ago. On arrival initial trops 0.89 >> 1.28. Troponins still elevated this afternoon. EKG showing RBBB w/ Left anterior fascicular block w/ T-wave inversion. Patient is not on any PTA anticoagulation. Being starting on a heparin drip for NSTEMI. Heparin restarted for possible PE with elevated pulmonary pressures.  01/01 @ 0900 HL 0.36  01/01 @ 1750 HL 1.61  01/02 @ 0014 HL 1.47 01/02 @ 2230 HL 0.76 01/03 @ 0816 HL 0.63  Goal of Therapy:  Heparin level 0.3-0.7 units/ml Monitor platelets by anticoagulation  protocol: Yes   Plan:  01/03 @ 0816 HL 0.63, therapeutic. Will continue rate of 1400 units/hr and will recheck @ 1400. CBC with AM labs.  Paticia Stack, PharmD Pharmacy Resident  02/16/2018 10:28 AM

## 2018-02-16 NOTE — Evaluation (Signed)
Physical Therapy Evaluation Patient Details Name: Kristen [name] MRN: [id number] DOB: 1966/02/11 Today's Date: 02/16/2018   History of Present Illness  53 year old female who was admitted initially with diagnosis of non-ST MI elevation.  Patient subsequently had a syncopal episode for which CODE BLUE was called and had transient episode of chest compressions before patient woke up.  She has since had a cardiac catheterization and been diagnosed with PE (on heparin drip) and at time of PT exam had not been up since Code episode.  Pt is s/p bariatic sx 01/29/18, total hip 6 months ago.  Clinical Impression  Pt showed great effort with initial PT assessment.  The last time she stood up she coded so she had some anxiety but ultimately showed great effort and motivation to push herself while still being cognizant of her current status and limitations (elevated HR 100s at rest, PEs, etc).  Pt was able to do bed mobility and transfers with only minimal assist and was able to walk ~30 ft in the room. Ambulation however was a great effort with very heavy reliance on the walker (no AD at baseline) HR increase to the 130s, DOE (though O2 remains mid 90s on 2 liters) and feeling very fatigued with the modest distance (chair follow for safety).  Pt states "deconditioning is no joke!" after the effort.  Pt is hopeful to be able to improve enough to be able to go home on d/c, but given her profound weakness and difference from baseline she is open to the idea of inpatient rehab should she continue to be very weak.       Follow Up Recommendations CIR    Equipment Recommendations  None recommended by PT    Recommendations for Other Services Rehab consult     Precautions / Restrictions Precautions Precautions: Fall Restrictions Weight Bearing Restrictions: No      Mobility  Bed Mobility Overal bed mobility: Modified Independent             General bed mobility comments: Pt was slow and labored  to get to sitting but able to get sitting upright w/o direct assist, able to maintain balance w/o assist but needed many minutes sitting EOB to adjust to first time sitting in a while (though she had no syncope s/s)  Transfers Overall transfer level: Needs assistance Equipment used: Rolling walker (2 wheeled) Transfers: Sit to/from Stand Sit to Stand: Min assist         General transfer comment: Pt showed very good effort with getting to standing, some pain and general fatigue but did not need excessive assist.  Pt did sit down quickly post ambulation and did not UEs to control descent  Ambulation/Gait Ambulation/Gait assistance: Min guard;Min assist Gait Distance (Feet): 30 Feet Assistive device: Rolling walker (2 wheeled)       General Gait Details: Pt with heavy reliance on the walker (no AD at baseline), O2 up to 2 liters secondary to shortness of breath (and PEs) with activity.  She was able to clear toes and maintain balance w/o direct assist most of the time but she clearly is far weaker than her normal and needed to muster great effort to do what she did this session.  Pt's HR into the 130s with the effort (O2 did remain in the 90s) and pt was extremely fatigued after this bout of activity.  Stairs            Wheelchair Mobility    Modified Rankin (Stroke Patients Only)  Balance Overall balance assessment: Needs assistance Sitting-balance support: Single extremity supported Sitting balance-Leahy Scale: Good Sitting balance - Comments: Pt with ability to maintain sitting at EOB w/o assist   Standing balance support: Bilateral upper extremity supported Standing balance-Leahy Scale: Fair Standing balance comment: No overt LOBs with standing acts, but extremely reliant on the walker, leaning forward                             Pertinent Vitals/Pain Pain Assessment: 0-10 Pain Score: 6  Pain Location: R rib pain from CPR    [place] expects to be discharged to:: Unsure Living Arrangements: Spouse/significant other               Additional Comments: Pt only has 1 small step to enter home, has FWW, bed room is up flight of steps but can stay on 1st floor it necessary    Prior Function Level of Independence: Independent         Comments: 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), drives, and generally has been able to be active     Hand Dominance        Extremity/Trunk Assessment   Upper Extremity Assessment Upper Extremity Assessment: Generalized weakness(R grossly 3+/5, L 4-/5)    Lower Extremity Assessment Lower Extremity Assessment: Generalized weakness(grossly 4/5 b/l)       Communication   Communication: No difficulties  Cognition Arousal/Alertness: Awake/alert Behavior During Therapy: WFL for tasks assessed/performed Overall Cognitive Status: Within Functional Limits for tasks assessed                                        General Comments      Exercises     Assessment/Plan    PT Assessment Patient needs continued PT services  PT Problem List Decreased strength;Decreased range of motion;Decreased activity tolerance;Decreased balance;Decreased mobility;Decreased safety awareness;Decreased knowledge of use of DME;Cardiopulmonary status limiting activity       PT Treatment Interventions DME instruction;Gait training;Functional mobility training;Therapeutic activities;Therapeutic exercise;Balance training;Neuromuscular re-education;Patient/family education    PT Goals (Current goals can be found in the Care Plan section)  Acute Rehab PT Goals Patient Stated Goal: go home if she is not still so weak when it is time to d/c PT Goal Formulation: With patient Time For Goal Achievement: 03/02/18 Potential to Achieve Goals: Good    Frequency Min 2X/week   Barriers to discharge        Co-evaluation               AM-PAC PT  "6 Clicks" Mobility  Outcome Measure Help needed turning from your back to your side while in a flat bed without using bedrails?: A Little Help needed moving from lying on your back to sitting on the side of a flat bed without using bedrails?: A Little Help needed moving to and from a bed to a chair (including a wheelchair)?: A Little Help needed standing up from a chair using your arms (e.g., wheelchair or bedside chair)?: A Little Help needed to walk in hospital room?: A Little Help needed climbing 3-5 steps with a railing? : A Lot 6 Click Score: 17    End of Session Equipment Utilized During Treatment: Gait belt;Oxygen(1-2 liters (2 during ambulation)) Activity Tolerance: Patient limited by fatigue Patient left: with chair alarm set;with  call bell/phone within reach;with family/visitor present;with nursing/sitter in room Nurse Communication: Mobility status(vitals with ambulation) PT Visit Diagnosis: Muscle weakness (generalized) (M62.81);Difficulty in walking, not elsewhere classified (R26.2)    Time: 1330-1415 PT Time Calculation (min) (ACUTE ONLY): 45 min   Charges:   PT Evaluation $PT Eval Moderate Complexity: 1 Mod PT Treatments $Gait Training: 8-22 mins        Kreg Shropshire, DPT 02/16/2018, 4:11 PM

## 2018-02-16 NOTE — Progress Notes (Signed)
ANTICOAGULATION CONSULT NOTE - Consult  Pharmacy Consult for heparin  Indication: DVT/PE (possible)  Allergies  Allergen Reactions  . Amoxicillin Hives    Has patient had a PCN reaction causing immediate rash, facial/tongue/throat swelling, SOB or lightheadedness with hypotension: No Has patient had a PCN reaction causing severe rash involving mucus membranes or skin necrosis: Yes Has patient had a PCN reaction that required hospitalization No Has patient had a PCN reaction occurring within the last 10 years: No If all of the above answers are "NO", then may proceed with Cephalosporin use.   . Fish Allergy Hives    Patient Measurements: Height: 5\' 4"  (162.6 cm) Weight: (!) 304 lb (137.9 kg) IBW/kg (Calculated) : 54.7 Heparin Dosing Weight: 89 kg  Vital Signs: Temp: 98.2 F (36.8 C) (01/02 2109) Temp Source: Oral (01/02 2109) BP: 111/70 (01/02 2109) Pulse Rate: 101 (01/02 2109)  Labs: Recent Labs    02/13/18 0311  02/13/18 0906  02/13/18 1640 02/13/18 1946 02/14/18 0307  02/15/18 0018 02/15/18 0620 02/15/18 1206 02/15/18 2258  HGB 11.0*  --   --   --   --   --  10.3*  --   --  10.1*  --   --   HCT 35.7*  --   --   --   --   --  33.7*  --   --  32.9*  --   --   PLT 468*  --   --   --   --   --  424*  --   --  413*  --   --   APTT 33  --   --   --   --   --   --   --   --   --   --   --   LABPROT 14.6  --   --   --   --   --   --   --   --   --   --   --   INR 1.15  --   --   --   --   --   --   --   --   --   --   --   HEPARINUNFRC  --   --   --    < >  --  <0.10* <0.10*   < > 1.47*  --  1.18* 0.76*  CREATININE 2.09*  --   --   --  1.93*  --   --   --   --  1.95*  --   --   TROPONINI 0.89*   < > 1.51*  --  1.82* 1.72*  --   --   --   --   --   --    < > = values in this interval not displayed.    Estimated Creatinine Clearance: 46.9 mL/min (A) (by C-G formula based on SCr of 1.95 mg/dL (H)).   Medical History: Past Medical History:  Diagnosis Date  .  Arthritis   . Diabetes ([place])    Type 2  . Hypertension   . URI (upper respiratory infection)    took antibiotic and prednisone oct 2019 resolved  . Uses contact lenses     Medications:  Scheduled:  . aspirin  81 mg Oral Q M,W,F  . calcium carbonate  1 tablet Oral TID  . docusate sodium  100 mg Oral BID  . FLUoxetine  20 mg Oral Daily  . insulin  aspart  0-5 Units Subcutaneous QHS  . insulin aspart  0-9 Units Subcutaneous TID WC  . loratadine  10 mg Oral Daily  . pantoprazole  40 mg Oral Daily  . sodium chloride flush  10-40 mL Intracatheter Q12H    Assessment: Patient admitted this am s/t having a syncopal episode s/p a gastric sleeve procedure 2 weeks ago. On arrival initial trops 0.89 >> 1.28. Troponins still elevated this afternoon. EKG showing RBBB w/ Left anterior fascicular block w/ T-wave inversion. Patient is not on any PTA anticoagulation. Being starting on a heparin drip for NSTEMI.   01/01 @ 0900 HL 0.36  01/01 @ 1750 HL 1.61  01/02 @ 0014 HL 1.47  Goal of Therapy:  Heparin level 0.3-0.7 units/ml Monitor platelets by anticoagulation protocol: Yes   Plan:  01/02 @ 2230 HL 0.76 supratherapeutic. Will drop rate down to 1400 units/hr and will recheck @ 0800  Tobie Lords, PharmD, BCPS Clinical Pharmacist 02/16/2018 1:58 AM

## 2018-02-16 NOTE — Care Management (Addendum)
Called [facility] pharmacy with patients new insurance card information.  With her insurance her charge would be $500.  She has a $2000.00 deductible.  Asked pharmacist if they honor the co-pay assist coupon and she said they do.  Provided patient with coupon.  Husband is calling to activate the card prior to discharge.  MD aware.

## 2018-02-16 NOTE — Progress Notes (Addendum)
ANTICOAGULATION CONSULT NOTE - Consult  Pharmacy Consult for heparin  Indication: PE  Allergies  Allergen Reactions  . Amoxicillin Hives    Has patient had a PCN reaction causing immediate rash, facial/tongue/throat swelling, SOB or lightheadedness with hypotension: No Has patient had a PCN reaction causing severe rash involving mucus membranes or skin necrosis: Yes Has patient had a PCN reaction that required hospitalization No Has patient had a PCN reaction occurring within the last 10 years: No If all of the above answers are "NO", then may proceed with Cephalosporin use.   . Fish Allergy Hives    Patient Measurements: Height: 5\' 4"  (162.6 cm) Weight: (!) 319 lb 8 oz (144.9 kg) IBW/kg (Calculated) : 54.7 Heparin Dosing Weight: 89 kg  Vital Signs: Temp: 97.6 F (36.4 C) (01/03 0827) Temp Source: Oral (01/03 0827) BP: 116/73 (01/03 0827) Pulse Rate: 108 (01/03 0827)  Labs: Recent Labs    02/13/18 1640  02/13/18 1946  02/14/18 0307  02/15/18 0620  02/15/18 2258 02/16/18 0816 02/16/18 1418  HGB  --   --   --    < > 10.3*  --  10.1*  --   --  10.0*  --   HCT  --   --   --   --  33.7*  --  32.9*  --   --  32.2*  --   PLT  --   --   --   --  424*  --  413*  --   --  442*  --   HEPARINUNFRC  --    < > <0.10*  --  <0.10*   < >  --    < > 0.76* 0.63 0.77*  CREATININE 1.93*  --   --   --   --   --  1.95*  --   --  1.73*  --   TROPONINI 1.82*  --  1.72*  --   --   --   --   --   --   --   --    < > = values in this interval not displayed.    Estimated Creatinine Clearance: 54.5 mL/min (A) (by C-G formula based on SCr of 1.73 mg/dL (H)).   Medical History: Past Medical History:  Diagnosis Date  . Arthritis   . Diabetes ([place])    Type 2  . Hypertension   . URI (upper respiratory infection)    took antibiotic and prednisone oct 2019 resolved  . Uses contact lenses     Medications:  Scheduled:  . aspirin  81 mg Oral Q M,W,F  . calcium carbonate  1 tablet Oral TID   . docusate sodium  100 mg Oral BID  . FLUoxetine  20 mg Oral Daily  . insulin aspart  0-5 Units Subcutaneous QHS  . insulin aspart  0-9 Units Subcutaneous TID WC  . loratadine  10 mg Oral Daily  . pantoprazole  40 mg Oral Daily  . protein supplement shake  11 oz Oral BID BM  . sodium chloride flush  10-40 mL Intracatheter Q12H    Assessment: Patient admitted this am s/t having a syncopal episode s/p a gastric sleeve procedure 2 weeks ago. On arrival initial trops 0.89 >> 1.28. Troponins still elevated this afternoon. EKG showing RBBB w/ Left anterior fascicular block w/ T-wave inversion. Patient is not on any PTA anticoagulation. Being starting on a heparin drip for NSTEMI. Heparin restarted for PE with elevated pulmonary pressures.  01/01 @  0900 HL 0.36  01/01 @ 1750 HL 1.61  01/02 @ 0014 HL 1.47 01/02 @ 2230 HL 0.76 01/03 @ 0816 HL 0.63 01/03 @ 1418 HL 0.77  Goal of Therapy:  Heparin level 0.3-0.7 units/ml Monitor platelets by anticoagulation protocol: Yes   Plan:  01/03 @ 1418 HL 0.77, supratherapeutic. Will decrease rate to 1300 units/hr and will recheck @ 2200. CBC with AM labs.  Paticia Stack, PharmD Pharmacy Resident  02/16/2018 2:46 PM

## 2018-02-16 NOTE — Progress Notes (Signed)
[facility]  ROUNDING NOTE   Subjective:   Sister at bedside.   Diagnosed with PE. Heparin gtt  UOP 639mL.   NS at 155mL/hr  Objective:  Vital signs in last 24 hours:  Temp:  [97.6 F (36.4 C)-98.2 F (36.8 C)] 97.6 F (36.4 C) (01/03 0827) Pulse Rate:  [101-112] 108 (01/03 0827) Resp:  [18-20] 20 (01/03 0827) BP: (89-122)/(55-81) 116/73 (01/03 0827) SpO2:  [93 %-98 %] 97 % (01/03 0827) Weight:  [137.9 kg-144.9 kg] 144.9 kg (01/03 0515)  Weight change: -1.656 kg Filed Weights   02/15/18 0351 02/15/18 1250 02/16/18 0515  Weight: (!) 139.5 kg (!) 137.9 kg (!) 144.9 kg    Intake/Output: I/O last 3 completed shifts: In: 0623 [I.V.:1145] Out: 650 [Urine:650]   Intake/Output this shift:  No intake/output data recorded.  Physical Exam: General: NAD  Head: Normocephalic, atraumatic. Moist oral mucosal membranes  Eyes: Anicteric, PERRL  Neck: Supple, trachea midline  Lungs:  Clear to auscultation  Heart: Regular rate and rhythm  Abdomen:  Soft, nontender,   Extremities:  no peripheral edema.  Neurologic: Nonfocal, moving all four extremities  Skin: No lesions  Access:      Basic Metabolic Panel: Recent Labs  Lab 02/13/18 0311 02/13/18 1640 02/15/18 0620 02/16/18 0816  NA 137 137 140 139  K 3.7 4.6 4.5 4.5  CL 104 108 113* 114*  CO2 18* 20* 17* 16*  GLUCOSE 265* 201* 166* 187*  BUN 39* 39* 47* 42*  CREATININE 2.09* 1.93* 1.95* 1.73*  CALCIUM 9.0 8.8* 8.7* 8.7*  MG 2.1 2.3 2.3 2.3  PHOS  --   --  3.7 4.0    Liver Function Tests: Recent Labs  Lab 02/14/18 1750  AST 229*  ALT 174*  ALKPHOS 121  BILITOT 1.1  PROT 7.7  ALBUMIN 3.5   No results for input(s): LIPASE, AMYLASE in the last 168 hours. No results for input(s): AMMONIA in the last 168 hours.  CBC: Recent Labs  Lab 02/13/18 0311 02/14/18 0307 02/15/18 0620 02/16/18 0816  WBC 12.8* 9.6 10.0 9.6  HGB 11.0* 10.3* 10.1* 10.0*  HCT 35.7* 33.7* 32.9* 32.2*  MCV 83.8 82.6  84.4 82.8  PLT 468* 424* 413* 442*    Cardiac Enzymes: Recent Labs  Lab 02/13/18 0311 02/13/18 0355 02/13/18 0906 02/13/18 1640 02/13/18 1946  TROPONINI 0.89* 1.28* 1.51* 1.82* 1.72*    BNP: Invalid input(s): POCBNP  CBG: Recent Labs  Lab 02/15/18 1421 02/15/18 1806 02/15/18 2110 02/16/18 0006 02/16/18 0828  GLUCAP 145* 134* 4* 170* 172*    Microbiology: Results for orders placed or performed during the hospital encounter of 07/31/17  Surgical pcr screen     Status: None   Collection Time: 07/31/17  9:40 AM  Result Value Ref Range Status   MRSA, PCR NEGATIVE NEGATIVE Final   Staphylococcus aureus NEGATIVE NEGATIVE Final    Comment: (NOTE) The Xpert SA Assay (FDA approved for NASAL specimens in patients 70 years of age and older), is one component of a comprehensive surveillance program. It is not intended to diagnose infection nor to guide or monitor treatment. Performed at [facility], [place] [address] [place], [place] [address]     Coagulation Studies: No results for input(s): LABPROT, INR in the last 72 hours.  Urinalysis: No results for input(s): COLORURINE, LABSPEC, PHURINE, GLUCOSEU, HGBUR, BILIRUBINUR, KETONESUR, PROTEINUR, UROBILINOGEN, NITRITE, LEUKOCYTESUR in the last 72 hours.  Invalid input(s): APPERANCEUR    Imaging: US Renal  Result Date:  02/14/2018 CLINICAL DATA:  Acute renal failure. EXAM: RENAL / URINARY TRACT ULTRASOUND COMPLETE COMPARISON:  None. FINDINGS: Right Kidney: Renal measurements: 9.6 x 4.0 x 4.3 cm = volume: 85.7 mL . Echogenicity within normal limits. No mass or hydronephrosis visualized. Left Kidney: Renal measurements: 10.4 x 4.4 x 4.0 cm = volume: 96.9 mL. Normal renal cortical thickness and echogenicity. No hydronephrosis. Within the midpole of the left kidney there is a 1.8 x 1.9 x 1.9 cm hypoechoic masslike structure. Bladder: Appears normal for degree of bladder distention. IMPRESSION: No  hydronephrosis. Indeterminate hypoechoic masslike structure within the midpole of the left kidney. Recommend dedicated evaluation with pre and post contrast-enhanced CT when patient clinically able. Electronically Signed   By: Lovey Newcomer M.D.   On: 02/14/2018 15:34   Nm Pulmonary Perf And Vent  Result Date: 02/15/2018 CLINICAL DATA:  Inpatient.  NSTEMI.  Syncopal episode. EXAM: NUCLEAR MEDICINE VENTILATION - PERFUSION LUNG SCAN TECHNIQUE: Ventilation images were obtained in multiple projections using inhaled aerosol Tc-52m DTPA. Perfusion images were obtained in multiple projections after intravenous injection of Tc-10m MAA. Lateral views could not be obtained due to patient body habitus. RADIOPHARMACEUTICALS:  33.6 mCi of Tc-49m DTPA aerosol inhalation and 4.0 mCi Tc68m MAA IV COMPARISON:  02/13/2018 chest radiograph. FINDINGS: Ventilation: Heterogeneous radiotracer uptake throughout the lungs on the ventilation images suggesting airways disease. Perfusion: There are several large segmental perfusion defects throughout both lungs, several of which are unmatched compared to the ventilation images and which have no correlate on the chest radiograph. IMPRESSION: High probability for pulmonary embolism (greater than 80%) per PIOPED II criteria. Electronically Signed   By: Ilona Sorrel M.D.   On: 02/15/2018 18:05   US Venous Img Lower Bilateral  Result Date: 02/15/2018 CLINICAL DATA:  Rule out DVT.  Concern for thromboembolic disease. EXAM: BILATERAL LOWER EXTREMITY VENOUS DOPPLER ULTRASOUND TECHNIQUE: Gray-scale sonography with graded compression, as well as color Doppler and duplex ultrasound were performed to evaluate the lower extremity deep venous systems from the level of the common femoral vein and including the common femoral, femoral, profunda femoral, popliteal and calf veins including the posterior tibial, peroneal and gastrocnemius veins when visible. The superficial great saphenous vein was also  interrogated. Spectral Doppler was utilized to evaluate flow at rest and with distal augmentation maneuvers in the common femoral, femoral and popliteal veins. COMPARISON:  None. FINDINGS: RIGHT LOWER EXTREMITY Common Femoral Vein: Difficult to evaluate because the patient had recent heart catheterization and the right groin was covered with a bandage. However, the distal common femoral artery is compressible with color Doppler flow. Saphenofemoral Junction: Not visualized due to the bandage. Profunda Femoral Vein: No evidence of thrombus. Normal compressibility and flow on color Doppler imaging. Femoral Vein: No evidence of thrombus. Normal compressibility, respiratory phasicity and response to augmentation. Popliteal Vein: No evidence of thrombus. Normal compressibility, respiratory phasicity and response to augmentation. Calf Veins: No evidence of thrombus. Normal compressibility and flow on color Doppler imaging. Other Findings:  None. LEFT LOWER EXTREMITY Common Femoral Vein: No evidence of thrombus. Normal compressibility, respiratory phasicity and response to augmentation. Saphenofemoral Junction: No evidence of thrombus. Normal compressibility and flow on color Doppler imaging. Profunda Femoral Vein: No evidence of thrombus. Normal compressibility and flow on color Doppler imaging. Femoral Vein: No evidence of thrombus. Normal compressibility, respiratory phasicity and response to augmentation. Incidentally, there appears to be prominent valves along the proximal femoral vein but this vessel is totally compressible. Popliteal Vein: No evidence of thrombus. Normal  compressibility, respiratory phasicity and response to augmentation. Calf Veins: No evidence of thrombus. Normal compressibility and flow on color Doppler imaging. Other Findings:  None. IMPRESSION: No evidence of deep venous thrombosis. Partial visualization of the right common femoral vein due to the right groin bandage. Electronically Signed    By: Markus Daft M.D.   On: 02/15/2018 17:31     Medications:   . sodium chloride 100 mL/hr at 02/14/18 2133  . heparin 1,400 Units/hr (02/16/18 0714)   . aspirin  81 mg Oral Q M,W,F  . calcium carbonate  1 tablet Oral TID  . docusate sodium  100 mg Oral BID  . FLUoxetine  20 mg Oral Daily  . insulin aspart  0-5 Units Subcutaneous QHS  . insulin aspart  0-9 Units Subcutaneous TID WC  . loratadine  10 mg Oral Daily  . pantoprazole  40 mg Oral Daily  . protein supplement shake  11 oz Oral BID BM  . sodium chloride flush  10-40 mL Intracatheter Q12H   acetaminophen **OR** acetaminophen, albuterol, ondansetron **OR** ondansetron (ZOFRAN) IV, sodium chloride flush  Assessment/ Plan:  Kristen [name] is a 53 y.o. black female with hypertension, diabetes mellitus type II, morbid obesity status post gastric sleeve 01/29/18 Dr. Redmond Pulling, right hip surgery, who was admitted to [facility] on 02/13/2018 for NSTEMI   1. Acute renal failure on chronic kidney disease stage III with proteinuria: baseline creatinine of 1.22, GFR of 59 on 01/30/18.  Chronic kidney disease secondary to diabetes and hypertension.  Acute renal failure from hypotension and possible over medication.  - Holding home regimen of olmesartan, hydrochlorothiazide - Discontinue NS - Repeat urine studies pending.   2. Hypotension with syncope:116/73  this morning.  holding home blood pressure regimen of olmesartan and hydrochlorothiazide.  - cardiac catheterization for later today. Echocardiogram with pulmonary edema. Appreciate cardiology input.  - discontinued gabapentin - holding metoprolol.   3. Diabetes mellitus type II with chronic kidney disease. On metformin at home.  Hemoglobin A1c of 8.7% on 01/24/18.  - holding metformin.  - Currently with sliding scale.   4. Left renal mass: incidental finding on ultrasound  - Check CT without contrast and outpatient follow up   LOS: 3 Kristen [name] 1/3/202011:19 AM

## 2018-02-16 NOTE — Progress Notes (Signed)
[place] [facility] [place] NAME: Kristen [name]    MR#:  [id number]  DATE OF BIRTH:  1965/04/27  SUBJECTIVE:  No new complaint this morning.  No chest pain.  No shortness of breath.  Resting comfortably. No fevers.  REVIEW OF SYSTEMS:  Review of Systems  Constitutional: Negative for chills, fever and weight loss.  HENT: Negative for hearing loss and tinnitus.   Eyes: Negative for blurred vision and double vision.  Respiratory: Negative for cough and hemoptysis.   Cardiovascular: Negative for chest pain.  Gastrointestinal: Negative for heartburn.  Genitourinary: Negative for dysuria and urgency.  Musculoskeletal: Negative for myalgias and neck pain.       Reported some chest discomfort at site of CPR done previously.  Skin: Negative for itching and rash.  Neurological: Negative for dizziness and headaches.  Psychiatric/Behavioral: Negative for depression and suicidal ideas.    [name]   DRUG ALLERGIES:   Allergies  Allergen Reactions  . Amoxicillin Hives    Has patient had a PCN reaction causing immediate rash, facial/tongue/throat swelling, SOB or lightheadedness with hypotension: No Has patient had a PCN reaction causing severe rash involving mucus membranes or skin necrosis: Yes Has patient had a PCN reaction that required hospitalization No Has patient had a PCN reaction occurring within the last 10 years: No If all of the above answers are "NO", then may proceed with Cephalosporin use.   . Fish Allergy Hives   VITALS:  Blood pressure 118/61, pulse (!) 115, temperature 98.2 F (36.8 C), temperature source Oral, resp. rate 19, height 5\' 4"  (1.626 m), weight (!) 144.9 kg, last menstrual period 01/18/2018, SpO2 94 %. PHYSICAL EXAMINATION:     Physical Exam  Constitutional: She is oriented to person, place, and time. No distress.  Obese  HENT:  Head: Normocephalic and atraumatic.  Eyes: Pupils are equal, round, and reactive to  light. Conjunctivae are normal.  Neck: Normal range of motion. Neck supple.  Cardiovascular: Normal rate and regular rhythm.  Pulmonary/Chest: Effort normal and breath sounds normal. No respiratory distress. She has no wheezes.  Abdominal: Soft. Bowel sounds are normal.  Central adiposity.  Musculoskeletal: Normal range of motion.        General: Edema present.  Neurological: She is alert and oriented to person, place, and time.  Skin: Skin is warm and dry.  Psychiatric: Affect and judgment normal.   LABORATORY PANEL:  Female CBC Recent Labs  Lab 02/16/18 0816  WBC 9.6  HGB 10.0*  HCT 32.2*  PLT 442*   ------------------------------------------------------------------------------------------------------------------ Chemistries  Recent Labs  Lab 02/14/18 1750  02/16/18 0816  NA  --    < > 139  K  --    < > 4.5  CL  --    < > 114*  CO2  --    < > 16*  GLUCOSE  --    < > 187*  BUN  --    < > 42*  CREATININE  --    < > 1.73*  CALCIUM  --    < > 8.7*  MG  --    < > 2.3  AST 229*  --   --   ALT 174*  --   --   ALKPHOS 121  --   --   BILITOT 1.1  --   --    < > = values in this interval not displayed.   RADIOLOGY:  No results found. ASSESSMENT AND PLAN:  Patient is a 53 year old female who was admitted initially with diagnosis of non-ST MI elevation.  Patient subsequently had a syncopal episode for which CODE BLUE was called and had transient episode of chest compressions before patient woke up and started talking.  1.  NSTEMI: Elevated troponins and ST changes.  Patient reported to have had a transient period of unresponsiveness and CODE BLUE was called on 02/13/2018.  Patient had brief chest compressions and subsequently woke up and started talking.  No evidence of cardiac arrest.  Was felt to have had a syncopal episode.  Blood sugar was 141.  Vitals were stable. Patient seen by cardiologist.  Recommendation is to continue supportive care for possible non-ST MI  elevation with heparin drip and aspirin and beta-blocker. Patient had cardiac catheterization done today by cardiologist showing hypertrophic cardiomyopathy with hyperdynamic ejection fraction of 70% normal coronary arteries and significantly elevated pulmonary pressures.  Recommendation is to continue heparin while undergoing further evaluation for possible pulmonary embolism.  Due to patient having cardiac catheterization with contrast exposure, will not do CTA chest to avoid acute kidney injury.  Venous Doppler ultrasound done was negative for DVT.  VQ scan came back as high probability for PE.  Patient currently on heparin drip.   Discussed with pharmacist.  Patient be transitioned from heparin drip to Eliquis at 10 mg twice daily for the first 7 days followed by 5 mg twice daily for treatment of acute PE.  Case manager already gave family the card for Eliquis so co-pay will be $10.  2.  Acute kidney injury: Secondary to dehydration.  Hydrate with intravenous fluid.  Avoid nephrotoxic agents. Continue IV fluid hydration.  Renal function improved and fairly stable. Nephrology already consulted.  Follow-up on renal function in a.m.  3.  Hypertension: Controlled; continue olmesartan.  Labetalol as needed.  4.  Diabetes mellitus type 2: Hold oral hypoglycemic agents.  Sliding scale insulin while hospitalized  5.  DVT prophylaxis: Patient already on heparin drip  The patient is a full code.  Time spent on admission orders and patient care approximately 34 minutes  Management plans discussed with the patient, family and they are in agreement.  CODE STATUS: Full Code  More than 50% of the time was spent in counseling/coordination of care: YES  POSSIBLE D/C IN AM,  Caidon Foti M.D on 02/16/2018 at 6:22 PM  After 6pm go to [email or web address] - Proofreader  [facility]  Office  [phone]  CC: Primary care physician; Glendale Chard, MD  Note: This dictation  was prepared with Dragon dictation along with smaller phrase technology. Any transcriptional errors that result from this process are unintentional.

## 2018-02-16 NOTE — Progress Notes (Signed)
ANTICOAGULATION CONSULT NOTE - Initial Consult  Pharmacy Consult for  Heparin  Indication: DVT  Allergies  Allergen Reactions  . Amoxicillin Hives    Has patient had a PCN reaction causing immediate rash, facial/tongue/throat swelling, SOB or lightheadedness with hypotension: No Has patient had a PCN reaction causing severe rash involving mucus membranes or skin necrosis: Yes Has patient had a PCN reaction that required hospitalization No Has patient had a PCN reaction occurring within the last 10 years: No If all of the above answers are "NO", then may proceed with Cephalosporin use.   . Fish Allergy Hives    Patient Measurements: Height: '5\' 4"'  (162.6 cm) Weight: (!) 319 lb 8 oz (144.9 kg) IBW/kg (Calculated) : 54.7 Heparin Dosing Weight:   Vital Signs: Temp: 98.2 F (36.8 C) (01/03 1724) Temp Source: Oral (01/03 1724) BP: 118/61 (01/03 1724) Pulse Rate: 115 (01/03 1724)  Labs: Recent Labs    02/13/18 1946  02/14/18 0307  02/15/18 0620  02/15/18 2258 02/16/18 0816 02/16/18 1418  HGB  --    < > 10.3*  --  10.1*  --   --  10.0*  --   HCT  --   --  33.7*  --  32.9*  --   --  32.2*  --   PLT  --   --  424*  --  413*  --   --  442*  --   HEPARINUNFRC <0.10*  --  <0.10*   < >  --    < > 0.76* 0.63 0.77*  CREATININE  --   --   --   --  1.95*  --   --  1.73*  --   TROPONINI 1.72*  --   --   --   --   --   --   --   --    < > = values in this interval not displayed.    Estimated Creatinine Clearance: 54.5 mL/min (A) (by C-G formula based on SCr of 1.73 mg/dL (H)).   Medical History: Past Medical History:  Diagnosis Date  . Arthritis   . Diabetes ([place])    Type 2  . Hypertension   . URI (upper respiratory infection)    took antibiotic and prednisone oct 2019 resolved  . Uses contact lenses     Medications:  Medications Prior to Admission  Medication Sig Dispense Refill Last Dose  . aspirin 81 MG chewable tablet Chew 1 tablet (81 mg total) by mouth 2 (two) times  daily. (Patient taking differently: Chew 81 mg by mouth every Monday, Wednesday, and Friday. ) 35 tablet 0 02/12/2018 at Unknown time  . calcium carbonate (TUMS - DOSED IN MG ELEMENTAL CALCIUM) 500 MG chewable tablet Chew 1 tablet by mouth 3 (three) times daily.   02/13/2018 at Unknown time  . cetirizine (ZYRTEC) 10 MG tablet Take 10 mg by mouth daily.   02/12/2018 at Unknown time  . FLUoxetine (PROZAC) 20 MG tablet Take 1 tablet (20 mg total) by mouth daily. 90 tablet 1 02/13/2018 at Unknown time  . gabapentin (NEURONTIN) 300 MG capsule Take 1 capsule (300 mg total) by mouth 2 (two) times daily. 40 capsule 0 02/13/2018 at Unknown time  . metFORMIN (GLUCOPHAGE XR) 500 MG 24 hr tablet Take 2 tabs by mouth twice a day (Patient taking differently: 500 mg daily. ) 360 tablet 1 Past Week at Unknown time  . Multiple Vitamins-Minerals (BARIATRIC MULTIVITAMINS/IRON PO) Take 1 tablet by mouth 2 (two) times  daily.   02/13/2018 at Unknown time  . olmesartan-hydrochlorothiazide (BENICAR HCT) 40-25 MG tablet Take 1 tablet by mouth daily. 90 tablet 1 Past Week at Unknown time  . pantoprazole (PROTONIX) 40 MG tablet Take 1 tablet (40 mg total) by mouth daily. 90 tablet 0 02/12/2018 at Unknown time  . albuterol (PROVENTIL HFA;VENTOLIN HFA) 108 (90 Base) MCG/ACT inhaler Inhale 1-2 puffs into the lungs every 6 (six) hours as needed for wheezing or shortness of breath. 1 Inhaler 2 prn at prn  . blood glucose meter kit and supplies KIT Dispense based on patient and insurance preference. Use up to four times daily as directed. (FOR ICD-9 250.00, 250.01). 1 each 0 Taking  . ondansetron (ZOFRAN-ODT) 4 MG disintegrating tablet Take 1 tablet (4 mg total) by mouth every 6 (six) hours as needed for nausea or vomiting. 20 tablet 0 prn at prn    Assessment: Pharmacy consulted to dose Eliquis in this 53 year old female with DVT.   Pt was previously treated with Heparin gtt. CrCl = 54.5 ml/min   Goal of Therapy:  resolution of  DVT  Monitor platelets by anticoagulation protocol: Yes   Plan:  Will d/c heparin gtt and begin Eliquis 10 mg PO BID X 7 days on 1/3 @ 1900 followed by Eliquis 5 mg PO BID to start on 1/10 @ 2200.  Chrisangel Eskenazi D 02/16/2018,7:01 PM

## 2018-02-17 DIAGNOSIS — N179 Acute kidney failure, unspecified: Secondary | ICD-10-CM

## 2018-02-17 DIAGNOSIS — D649 Anemia, unspecified: Secondary | ICD-10-CM

## 2018-02-17 DIAGNOSIS — I2699 Other pulmonary embolism without acute cor pulmonale: Secondary | ICD-10-CM

## 2018-02-17 DIAGNOSIS — D473 Essential (hemorrhagic) thrombocythemia: Secondary | ICD-10-CM

## 2018-02-17 DIAGNOSIS — D75839 Thrombocytosis, unspecified: Secondary | ICD-10-CM

## 2018-02-17 LAB — BASIC METABOLIC PANEL
Anion gap: 8 (ref 5–15)
BUN: 43 mg/dL — ABNORMAL HIGH (ref 6–20)
CO2: 17 mmol/L — ABNORMAL LOW (ref 22–32)
Calcium: 8.6 mg/dL — ABNORMAL LOW (ref 8.9–10.3)
Chloride: 111 mmol/L (ref 98–111)
Creatinine, Ser: 1.7 mg/dL — ABNORMAL HIGH (ref 0.44–1.00)
GFR calc Af Amer: 39 mL/min — ABNORMAL LOW (ref >60)
GFR calc non Af Amer: 34 mL/min — ABNORMAL LOW (ref >60)
Glucose, Bld: 186 mg/dL — ABNORMAL HIGH (ref 70–99)
Potassium: 4.5 mmol/L (ref 3.5–5.1)
Sodium: 136 mmol/L (ref 135–145)

## 2018-02-17 LAB — MAGNESIUM: Magnesium: 2.4 mg/dL (ref 1.7–2.4)

## 2018-02-17 LAB — CBC
HCT: 32.4 % — ABNORMAL LOW (ref 36.0–46.0)
Hemoglobin: 9.7 g/dL — ABNORMAL LOW (ref 12.0–15.0)
MCH: 25.1 pg — ABNORMAL LOW (ref 26.0–34.0)
MCHC: 29.9 g/dL — ABNORMAL LOW (ref 30.0–36.0)
MCV: 83.9 fL (ref 80.0–100.0)
Platelets: 440 10*3/uL — ABNORMAL HIGH (ref 150–400)
RBC: 3.86 MIL/uL — ABNORMAL LOW (ref 3.87–5.11)
RDW: 19.1 % — ABNORMAL HIGH (ref 11.5–15.5)
WBC: 9.8 10*3/uL (ref 4.0–10.5)
nRBC: 0.6 % — ABNORMAL HIGH (ref 0.0–0.2)

## 2018-02-17 LAB — GLUCOSE, CAPILLARY
Glucose-Capillary: 170 mg/dL — ABNORMAL HIGH (ref 70–99)
Glucose-Capillary: 200 mg/dL — ABNORMAL HIGH (ref 70–99)
Glucose-Capillary: 175 mg/dL — ABNORMAL HIGH (ref 70–99)
Glucose-Capillary: 194 mg/dL — ABNORMAL HIGH (ref 70–99)

## 2018-02-17 LAB — ALBUMIN: Albumin: 3.4 g/dL — ABNORMAL LOW (ref 3.5–5.0)

## 2018-02-17 LAB — PHOSPHORUS: Phosphorus: 3.5 mg/dL (ref 2.5–4.6)

## 2018-02-17 MED ORDER — APIXABAN 5 MG PO TABS: ORAL_TABLET | ORAL | 1 refills | Status: DC

## 2018-02-17 NOTE — Progress Notes (Signed)
[facility]  ROUNDING NOTE   Subjective:   Off IV fluids  Seated in chair.   1L [place] O2  Objective:  Vital signs in last 24 hours:  Temp:  [97.5 F (36.4 C)-98.3 F (36.8 C)] 98.3 F (36.8 C) (01/04 0803) Pulse Rate:  [106-115] 106 (01/04 0803) Resp:  [16-19] 19 (01/04 0803) BP: (95-118)/(61-73) 95/73 (01/04 0803) SpO2:  [93 %-96 %] 95 % (01/04 0803) Weight:  [144.9 kg] 144.9 kg (01/04 0544)  Weight change: 6.985 kg Filed Weights   02/15/18 1250 02/16/18 0515 02/17/18 0544  Weight: (!) 137.9 kg (!) 144.9 kg (!) 144.9 kg    Intake/Output: I/O last 3 completed shifts: In: 988.6 [I.V.:988.6] Out: 1200 [Urine:1200]   Intake/Output this shift:  No intake/output data recorded.  Physical Exam: General: NAD  Head: Normocephalic, atraumatic. Moist oral mucosal membranes  Eyes: Anicteric, PERRL  Neck: Supple, trachea midline  Lungs:  Clear to auscultation, 1 L [place] O2  Heart: Regular rate and rhythm  Abdomen:  Soft, nontender,   Extremities:  no peripheral edema.  Neurologic: Nonfocal, moving all four extremities  Skin: No lesions         Basic Metabolic Panel: Recent Labs  Lab 02/13/18 0311 02/13/18 1640 02/15/18 0620 02/16/18 0816 02/17/18 0037  NA 137 137 140 139 136  K 3.7 4.6 4.5 4.5 4.5  CL 104 108 113* 114* 111  CO2 18* 20* 17* 16* 17*  GLUCOSE 265* 201* 166* 187* 186*  BUN 39* 39* 47* 42* 43*  CREATININE 2.09* 1.93* 1.95* 1.73* 1.70*  CALCIUM 9.0 8.8* 8.7* 8.7* 8.6*  MG 2.1 2.3 2.3 2.3 2.4  PHOS  --   --  3.7 4.0 3.5    Liver Function Tests: Recent Labs  Lab 02/14/18 1750 02/17/18 0037  AST 229*  --   ALT 174*  --   ALKPHOS 121  --   BILITOT 1.1  --   PROT 7.7  --   ALBUMIN 3.5 3.4*   No results for input(s): LIPASE, AMYLASE in the last 168 hours. No results for input(s): AMMONIA in the last 168 hours.  CBC: Recent Labs  Lab 02/13/18 0311 02/14/18 0307 02/15/18 0620 02/16/18 0816 02/17/18 0037  WBC 12.8* 9.6 10.0  9.6 9.8  HGB 11.0* 10.3* 10.1* 10.0* 9.7*  HCT 35.7* 33.7* 32.9* 32.2* 32.4*  MCV 83.8 82.6 84.4 82.8 83.9  PLT 468* 424* 413* 442* 440*    Cardiac Enzymes: Recent Labs  Lab 02/13/18 0311 02/13/18 0355 02/13/18 0906 02/13/18 1640 02/13/18 1946  TROPONINI 0.89* 1.28* 1.51* 1.82* 1.72*    BNP: Invalid input(s): POCBNP  CBG: Recent Labs  Lab 02/16/18 1146 02/16/18 1724 02/16/18 2138 02/17/18 0804 02/17/18 1141  GLUCAP 166* 212* 168* 170* 200*    Microbiology: Results for orders placed or performed during the hospital encounter of 07/31/17  Surgical pcr screen     Status: None   Collection Time: 07/31/17  9:40 AM  Result Value Ref Range Status   MRSA, PCR NEGATIVE NEGATIVE Final   Staphylococcus aureus NEGATIVE NEGATIVE Final    Comment: (NOTE) The Xpert SA Assay (FDA approved for NASAL specimens in patients 3 years of age and older), is one component of a comprehensive surveillance program. It is not intended to diagnose infection nor to guide or monitor treatment. Performed at [facility], [place] [address] [place], [place] [address]     Coagulation Studies: No results for input(s): LABPROT, INR in the last 72 hours.  Urinalysis: No results for input(s): COLORURINE, LABSPEC, PHURINE, GLUCOSEU, HGBUR, BILIRUBINUR, KETONESUR, PROTEINUR, UROBILINOGEN, NITRITE, LEUKOCYTESUR in the last 72 hours.  Invalid input(s): APPERANCEUR    Imaging: Nm Pulmonary Perf And Vent  Result Date: 02/15/2018 CLINICAL DATA:  Inpatient.  NSTEMI.  Syncopal episode. EXAM: NUCLEAR MEDICINE VENTILATION - PERFUSION LUNG SCAN TECHNIQUE: Ventilation images were obtained in multiple projections using inhaled aerosol Tc-42m DTPA. Perfusion images were obtained in multiple projections after intravenous injection of Tc-53m MAA. Lateral views could not be obtained due to patient body habitus. RADIOPHARMACEUTICALS:  33.6 mCi of Tc-54m DTPA aerosol inhalation and 4.0 mCi Tc35m  MAA IV COMPARISON:  02/13/2018 chest radiograph. FINDINGS: Ventilation: Heterogeneous radiotracer uptake throughout the lungs on the ventilation images suggesting airways disease. Perfusion: There are several large segmental perfusion defects throughout both lungs, several of which are unmatched compared to the ventilation images and which have no correlate on the chest radiograph. IMPRESSION: High probability for pulmonary embolism (greater than 80%) per PIOPED II criteria. Electronically Signed   By: Ilona Sorrel M.D.   On: 02/15/2018 18:05   US Venous Img Lower Bilateral  Result Date: 02/15/2018 CLINICAL DATA:  Rule out DVT.  Concern for thromboembolic disease. EXAM: BILATERAL LOWER EXTREMITY VENOUS DOPPLER ULTRASOUND TECHNIQUE: Gray-scale sonography with graded compression, as well as color Doppler and duplex ultrasound were performed to evaluate the lower extremity deep venous systems from the level of the common femoral vein and including the common femoral, femoral, profunda femoral, popliteal and calf veins including the posterior tibial, peroneal and gastrocnemius veins when visible. The superficial great saphenous vein was also interrogated. Spectral Doppler was utilized to evaluate flow at rest and with distal augmentation maneuvers in the common femoral, femoral and popliteal veins. COMPARISON:  None. FINDINGS: RIGHT LOWER EXTREMITY Common Femoral Vein: Difficult to evaluate because the patient had recent heart catheterization and the right groin was covered with a bandage. However, the distal common femoral artery is compressible with color Doppler flow. Saphenofemoral Junction: Not visualized due to the bandage. Profunda Femoral Vein: No evidence of thrombus. Normal compressibility and flow on color Doppler imaging. Femoral Vein: No evidence of thrombus. Normal compressibility, respiratory phasicity and response to augmentation. Popliteal Vein: No evidence of thrombus. Normal compressibility,  respiratory phasicity and response to augmentation. Calf Veins: No evidence of thrombus. Normal compressibility and flow on color Doppler imaging. Other Findings:  None. LEFT LOWER EXTREMITY Common Femoral Vein: No evidence of thrombus. Normal compressibility, respiratory phasicity and response to augmentation. Saphenofemoral Junction: No evidence of thrombus. Normal compressibility and flow on color Doppler imaging. Profunda Femoral Vein: No evidence of thrombus. Normal compressibility and flow on color Doppler imaging. Femoral Vein: No evidence of thrombus. Normal compressibility, respiratory phasicity and response to augmentation. Incidentally, there appears to be prominent valves along the proximal femoral vein but this vessel is totally compressible. Popliteal Vein: No evidence of thrombus. Normal compressibility, respiratory phasicity and response to augmentation. Calf Veins: No evidence of thrombus. Normal compressibility and flow on color Doppler imaging. Other Findings:  None. IMPRESSION: No evidence of deep venous thrombosis. Partial visualization of the right common femoral vein due to the right groin bandage. Electronically Signed   By: Markus Daft M.D.   On: 02/15/2018 17:31     Medications:    . apixaban  10 mg Oral BID   Followed by  . [START ON 02/23/2018] apixaban  5 mg Oral BID  . aspirin  81 mg Oral Q M,W,F  . calcium carbonate  1  tablet Oral TID  . docusate sodium  100 mg Oral BID  . FLUoxetine  20 mg Oral Daily  . insulin aspart  0-5 Units Subcutaneous QHS  . insulin aspart  0-9 Units Subcutaneous TID WC  . loratadine  10 mg Oral Daily  . pantoprazole  40 mg Oral Daily  . protein supplement shake  11 oz Oral BID BM  . sodium chloride flush  10-40 mL Intracatheter Q12H   acetaminophen **OR** acetaminophen, albuterol, ondansetron **OR** ondansetron (ZOFRAN) IV, sodium chloride flush  Assessment/ Plan:  Ms. Kristen [name] is a 53 y.o. black female with hypertension,  diabetes mellitus type II, morbid obesity status post gastric sleeve 01/29/18 Dr. Redmond Pulling, right hip surgery, who was admitted to [facility] on 02/13/2018 for NSTEMI   1. Acute renal failure on chronic kidney disease stage III with proteinuria: baseline creatinine of 1.22, GFR of 59 on 01/30/18.  Chronic kidney disease secondary to diabetes and hypertension.  Acute renal failure from hypotension and possible over medication.  - Holding home regimen of olmesartan, hydrochlorothiazide  2. Hypotension with syncope:95/73 holding home blood pressure regimen of olmesartan and hydrochlorothiazide.  - cardiac catheterization for later today. Echocardiogram with pulmonary edema. Appreciate cardiology input.  - discontinued gabapentin - holding metoprolol.   3. Diabetes mellitus type II with chronic kidney disease. On metformin at home.  Hemoglobin A1c of 8.7% on 01/24/18.  - holding metformin.  - Currently with sliding scale.   4. Left renal mass: incidental finding on ultrasound  - Check CT without contrast and outpatient follow up   LOS: 4 Kristen [name] 1/4/202012:44 PM

## 2018-02-17 NOTE — Progress Notes (Addendum)
[facility] [place] NAME: Kristen [name]    MR#:  [id number]  DATE OF BIRTH:  09-13-1965  SUBJECTIVE:  Generalized weakness, mild shortness of breath, tachycardia on exertion, on oxygen 1 L. REVIEW OF SYSTEMS:  Review of Systems  Constitutional: Positive for malaise/fatigue. Negative for chills, fever and weight loss.  HENT: Negative for hearing loss and tinnitus.   Eyes: Negative for blurred vision and double vision.  Respiratory: Positive for shortness of breath. Negative for cough and hemoptysis.   Cardiovascular: Negative for chest pain and leg swelling.  Gastrointestinal: Negative for heartburn.  Genitourinary: Negative for dysuria and urgency.  Musculoskeletal: Negative for myalgias and neck pain.  Skin: Negative for itching and rash.  Neurological: Negative for dizziness and headaches.  Psychiatric/Behavioral: Negative for depression. The patient is not nervous/anxious.     [name]   DRUG ALLERGIES:   Allergies  Allergen Reactions  . Amoxicillin Hives    Has patient had a PCN reaction causing immediate rash, facial/tongue/throat swelling, SOB or lightheadedness with hypotension: No Has patient had a PCN reaction causing severe rash involving mucus membranes or skin necrosis: Yes Has patient had a PCN reaction that required hospitalization No Has patient had a PCN reaction occurring within the last 10 years: No If all of the above answers are "NO", then may proceed with Cephalosporin use.   . Fish Allergy Hives   VITALS:  Blood pressure 95/73, pulse (!) 116, temperature 98.3 F (36.8 C), temperature source Oral, resp. rate 19, height 5\' 4"  (1.626 m), weight (!) 144.9 kg, last menstrual period 01/18/2018, SpO2 97 %. PHYSICAL EXAMINATION:     Physical Exam  Constitutional: She is oriented to person, place, and time and well-developed, well-nourished, and in no distress. No distress.  Morbid obesity  HENT:  Head:  Normocephalic and atraumatic.  Mouth/Throat: Oropharynx is clear and moist.  Eyes: Pupils are equal, round, and reactive to light. Conjunctivae and EOM are normal. No scleral icterus.  Neck: Normal range of motion. Neck supple. No JVD present. No tracheal deviation present.  Cardiovascular: Normal rate, regular rhythm and normal heart sounds. Exam reveals no gallop.  No murmur heard. Pulmonary/Chest: Effort normal and breath sounds normal. No respiratory distress. She has no wheezes. She has no rales.  Abdominal: Soft. Bowel sounds are normal. She exhibits no distension. There is no abdominal tenderness. There is no rebound.  Central adiposity.  Musculoskeletal: Normal range of motion.        General: Edema present. No tenderness.  Neurological: She is alert and oriented to person, place, and time. No cranial nerve deficit.  Skin: Skin is warm and dry. No rash noted. No erythema.  Psychiatric: Affect and judgment normal.   LABORATORY PANEL:  Female CBC Recent Labs  Lab 02/17/18 0037  WBC 9.8  HGB 9.7*  HCT 32.4*  PLT 440*   ------------------------------------------------------------------------------------------------------------------ Chemistries  Recent Labs  Lab 02/14/18 1750  02/17/18 0037  NA  --    < > 136  K  --    < > 4.5  CL  --    < > 111  CO2  --    < > 17*  GLUCOSE  --    < > 186*  BUN  --    < > 43*  CREATININE  --    < > 1.70*  CALCIUM  --    < > 8.6*  MG  --    < > 2.4  AST 229*  --   --   ALT 174*  --   --   ALKPHOS 121  --   --   BILITOT 1.1  --   --    < > = values in this interval not displayed.   RADIOLOGY:  No results found. ASSESSMENT AND PLAN:   Patient is a 53 year old female who was admitted initially with diagnosis of non-ST MI elevation.  Patient subsequently had a syncopal episode for which CODE BLUE was called and had transient episode of chest compressions before patient woke up and started talking.  1.  NSTEMI: Elevated troponins and  ST changes.  Patient reported to have had a transient period of unresponsiveness and CODE BLUE was called on 02/13/2018.  Patient had brief chest compressions and subsequently woke up and started talking.  No evidence of cardiac arrest.  Was felt to have had a syncopal episode.  Blood sugar was 141.  Vitals were stable. Patient seen by cardiologist.  Recommendation is to continue supportive care for possible non-ST MI elevation with heparin drip and aspirin and beta-blocker. Patient had cardiac catheterization done by cardiologist showing hypertrophic cardiomyopathy with hyperdynamic ejection fraction of 70% normal coronary arteries and significantly elevated pulmonary pressures.  Recommendation is to continue heparin while undergoing further evaluation for possible pulmonary embolism.   Pulmonary embolism. Due to patient having cardiac catheterization with contrast exposure, No CTA chest to avoid acute kidney injury.  Venous Doppler ultrasound done was negative for DVT.  VQ scan came back as high probability for PE.  Patient was on heparin drip, changed to Eliquis at 10 mg twice daily for the first 7 days followed by 5 mg twice daily for treatment of acute PE.   Oncology consult for coagulation work-up.  2.  Acute kidney injury: Holding home regimen of olmesartan, hydrochlorothiazide. She is treated with IV fluid hydration.  Renal function improved and fairly stable.  Follow-up nephrologist as outpatient.  3.  Hypertension: Controlled; continue olmesartan.  Labetalol as needed.  4.  Diabetes mellitus type 2: Hold oral hyperglycemic agents.  Sliding scale insulin while hospitalized  5.    Generalized weakness.  Ambulate the patient. The patient was very independent at home. But this time she may need home health and PT.  Morbid obesity.  Diet control and follow-up with PCP.  S/p gastric bypass.  Management plans discussed with the patient, family and they are in agreement. Time spent on  admission orders and patient care approximately 33 minutes.  CODE STATUS: Full Code  More than 50% of the time was spent in counseling/coordination of care: YES  POSSIBLE D/C IN AM,  Demetrios Loll M.D on 02/17/2018 at 3:52 PM  After 6pm go to [email or web address] - Proofreader  [facility]  Office  [phone]  CC: Primary care physician; Glendale Chard, MD  Note: This dictation was prepared with Dragon dictation along with smaller phrase technology. Any transcriptional errors that result from this process are unintentional.

## 2018-02-17 NOTE — Evaluation (Signed)
Occupational Therapy Evaluation Patient Details Name: Kristen [name] MRN: [id number] DOB: 1965/03/18 Today's Date: 02/17/2018    History of Present Illness 53 year old female who was admitted initially with diagnosis of non-ST MI elevation.  Patient subsequently had a syncopal episode for which CODE BLUE was called and had transient episode of chest compressions before patient woke up.  She has since had a cardiac catheterization and been diagnosed with PE (on heparin drip).  Pt is s/p bariatic sx 01/29/18, total hip 6 months ago., anterior approach.   Clinical Impression   Patient referred for OT evaluation.  Patient lives with her husband (who is on disability) in a 2 level town home with her bedroom on the 2nd floor.  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  She presents with muscle weakness, decreased activity tolerance, on 1L of O2 and requires increased assist for daily tasks.  She would benefit from skilled OT to maximize safety and independence in necessary ADL/IADL  tasks.  Depending on her progress, She may benefit from CIR upon discharge to further address her limitations since she will need to be independent with basic self care and IADL tasks to manage the home and return to work. She demonstrates increased HR with activities in standing presently and requires min guard for transfers, and has only engaged in limited mobility around the room in the last 24 hours.       Follow Up Recommendations  CIR    Equipment Recommendations       Recommendations for Other Services       Precautions / Restrictions Precautions Precautions: Fall Restrictions Weight Bearing Restrictions: No      Mobility Bed Mobility Overal bed mobility: Modified Independent                Transfers Overall transfer level: Needs assistance Equipment used: Rolling walker (2 wheeled) Transfers: Sit to/from Stand Sit to Stand:  Min guard              Balance Overall balance assessment: Needs assistance Sitting-balance support: Feet supported Sitting balance-Leahy Scale: Good     Standing balance support: Bilateral upper extremity supported Standing balance-Leahy Scale: Fair                             ADL either performed or assessed with clinical judgement   ADL Overall ADL's : Needs assistance/impaired Eating/Feeding: Independent   Grooming: Sitting Grooming Details (indicate cue type and reason): Increased heart rate with acts in standing Upper Body Bathing: Independent   Lower Body Bathing: Minimal assistance   Upper Body Dressing : Independent   Lower Body Dressing: Modified independent   Toilet Transfer: Min guard   Toileting- Clothing Manipulation and Hygiene: Min guard       Functional mobility during ADLs: Min guard;Rolling walker General ADL Comments: Patient was independent prior to admission and working full time as a Therapist, 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 Given her recent medical events she indicates she is fearful at times to move and still timid today with activity.  HR does increase with activities in standing.  She is able to perform lower body dressing with effort and some mild shortness of breath, on 1 L o2 currently.  Min guard for transfer from chair this date.      Vision   Additional Comments: Wears contact lenses  normally     Perception     Praxis      Pertinent Vitals/Pain Pain Assessment: No/denies pain Pain Score: 0-No pain Pain Location: R rib pain from CPR - better today Pain Descriptors / Indicators: Sore Pain Intervention(s): Limited activity within patient's tolerance;Monitored during session     Hand Dominance Right   Extremity/Trunk Assessment Upper Extremity Assessment Upper Extremity Assessment: Generalized weakness   Lower Extremity Assessment Lower Extremity Assessment: Defer to PT evaluation       Communication  Communication Communication: Passy-Muir valve   Cognition Arousal/Alertness: Awake/alert Behavior During Therapy: WFL for tasks assessed/performed Overall Cognitive Status: Within Functional Limits for tasks assessed                                     General Comments       Exercises Other Exercises Other Exercises: supine arom ankle pumps, SLR, heel slides, ab/add quad and glut sets x 10   Shoulder Instructions      Home Living Family/patient expects to be discharged to:: Private residence Living Arrangements: Spouse/significant other Available Help at Discharge: Family Type of Home: House(town home, 2 level bedroom on 2nd level) Home Access: Level entry     Home Layout: Bed/bath upstairs;Two level Alternate Level Stairs-Number of Steps: 1 flight Alternate Level Stairs-Rails: Right Bathroom Shower/Tub: Occupational psychologist: Standard     Home Equipment: Cane - single point   Additional Comments: Pt only has 1 small step to enter home, has FWW, bed room is up flight of steps but can stay on 1st floor if necessary, has a recliner chair downstairs      Prior Functioning/Environment Level of Independence: Independent        Comments: Pt had just returned to work s/p bariatic sx, drives, and generally has been able to be active        OT Problem List: Decreased strength;Decreased activity tolerance;Cardiopulmonary status limiting activity      OT Treatment/Interventions: Self-care/ADL training;Energy conservation;Therapeutic exercise;DME and/or AE instruction;Therapeutic activities;Patient/family education    OT Goals(Current goals can be found in the care plan section) Acute Rehab OT Goals Patient Stated Goal: Patient would like to return to her prior level of independence  OT Goal Formulation: With patient Time For Goal Achievement: 03/03/18 Potential to Achieve Goals: Good ADL Goals Pt Will Perform Lower Body Dressing: with modified  independence Pt Will Transfer to Toilet: with modified independence  OT Frequency: Min 3X/week   Barriers to D/C:            Co-evaluation              AM-PAC OT "6 Clicks" Daily Activity     Outcome Measure Help from another person eating meals?: None Help from another person taking care of personal grooming?: None Help from another person toileting, which includes using toliet, bedpan, or urinal?: A Little Help from another person bathing (including washing, rinsing, drying)?: A Little Help from another person to put on and taking off regular upper body clothing?: None Help from another person to put on and taking off regular lower body clothing?: A Little 6 Click Score: 21   End of Session Equipment Utilized During Treatment: Rolling walker  Activity Tolerance: Patient tolerated treatment well Patient left: in chair;with call bell/phone within reach  OT Visit Diagnosis: Muscle weakness (generalized) (M62.81)  Time: 1130-1156 OT Time Calculation (min): 26 min Charges:  OT General Charges $OT Visit: 1 Visit OT Evaluation $OT Eval Low Complexity: 1 Low OT Treatments $Self Care/Home Management : 8-22 mins  Amy T Lovett, OTR/L, CLT   Lovett,Amy 02/17/2018, 12:30 PM

## 2018-02-17 NOTE — Consult Note (Addendum)
Hematology/Oncology Consult note [facility] Telephone:([phone] [phone]  Patient Care Team: Glendale Chard, MD as PCP - General (Internal Medicine)   Name of the patient: Kristen [name]  [id number]  1965-12-25   Date of visit: 02/17/18 REASON FOR COSULTATION:  Pulmonary embolism.  History of presenting illness-  53 y.o. female with PMH listed at below who presents to ER for evaluation of syncope episode. Patient fainted but did not fall or hurt herself as family member caught patient.  EKG in ED showed ST changed. Troponin elevated and started on heparin drip and admitted.  On day 1 of admission, CODE BLUE was called and patient was unresponsive and received brief chest compression. Patient woke up and talked.  No cardiac arrest.  She reports feeling dizzy and lightheaded and passed out.  Similar to the syncope episode prior to admission. Cardiac catheterization showed normal LV systolic function and hyperdynamic in nature with ejection fraction of 70% consistent with hyper trophic cardiomyopathy.  Significantly elevated pulmonary pressure possibly consistent with pulmonary embolism.  No cardiac intervention due to normal coronary arteries. Patient had VQ scan on 02/15/2018 which showed hypermobility for pulmonary embolism Patient was on heparin drip and was transitioned to Eliquis 10 mg twice daily started on 02/16/2018. Heme-onc was consulted for evaluation of PE and establish care.  Patient reports feeling better today.  Denies any dizziness, lightheadedness.  Denies any recent prolonged car trip or air flight. She had a recent laparoscopic sleeve gastrectomy on 01/29/2018. Denies any previous personal history and family history of blood clots. Cancer screening including mammogram and colonoscopy up-to-date  Review of Systems  Constitutional: Negative for appetite change, chills, fatigue and fever.  HENT:   Negative for hearing loss and voice  change.   Eyes: Negative for eye problems.  Respiratory: Negative for chest tightness and cough.   Cardiovascular: Negative for chest pain.  Gastrointestinal: Negative for abdominal distention, abdominal pain and blood in stool.  Endocrine: Negative for hot flashes.  Genitourinary: Negative for difficulty urinating and frequency.   Musculoskeletal: Negative for arthralgias.  Skin: Negative for itching and rash.  Neurological: Negative for extremity weakness.  Hematological: Negative for adenopathy.  Psychiatric/Behavioral: Negative for confusion.    Allergies  Allergen Reactions  . Amoxicillin Hives    Has patient had a PCN reaction causing immediate rash, facial/tongue/throat swelling, SOB or lightheadedness with hypotension: No Has patient had a PCN reaction causing severe rash involving mucus membranes or skin necrosis: Yes Has patient had a PCN reaction that required hospitalization No Has patient had a PCN reaction occurring within the last 10 years: No If all of the above answers are "NO", then may proceed with Cephalosporin use.   . Fish Allergy Hives    Patient Active Problem List   Diagnosis Date Noted  . NSTEMI (non-ST elevated myocardial infarction) ([place]) 02/13/2018  . Status post gastrectomy 02/05/2018  . Essential hypertension 02/05/2018  . Type 2 diabetes mellitus without complication, without long-term current use of insulin ([place]) 02/05/2018  . Obesity 01/29/2018  . Status post total replacement of right hip 08/04/2017  . Unilateral primary osteoarthritis, right knee 07/06/2016  . Unilateral primary osteoarthritis, right hip 07/06/2016  . Acute pain of right knee 07/06/2016  . Pain in right hip 07/06/2016     Past Medical History:  Diagnosis Date  . Arthritis   . Diabetes ([place])    Type 2  . Hypertension   . URI (upper respiratory infection)    took antibiotic  and prednisone oct 2019 resolved  . Uses contact lenses      Past Surgical History:    Procedure Laterality Date  . COLONOSCOPY WITH PROPOFOL N/A 03/18/2016   Procedure: COLONOSCOPY WITH PROPOFOL;  Surgeon: Carol Ada, MD;  Location: [facility];  Service: Endoscopy;  Laterality: N/A;  . LAPAROSCOPIC GASTRIC SLEEVE RESECTION N/A 01/29/2018   Procedure: LAPAROSCOPIC GASTRIC SLEEVE RESECTION WITH UPPER ENDO AND ERAS PATHWAY;  Surgeon: Kinsinger, Arta Bruce, MD;  Location: [facility];  Service: General;  Laterality: N/A;  . RIGHT/LEFT HEART CATH AND CORONARY ANGIOGRAPHY N/A 02/15/2018   Procedure: RIGHT/LEFT HEART CATH AND CORONARY ANGIOGRAPHY;  Surgeon: Corey Skains, MD;  Location: [place] [facility];  Service: Cardiovascular;  Laterality: N/A;  . TOTAL HIP ARTHROPLASTY Right 08/04/2017   Procedure: RIGHT TOTAL HIP ARTHROPLASTY ANTERIOR APPROACH;  Surgeon: Mcarthur Rossetti, MD;  Location: [facility];  Service: Orthopedics;  Laterality: Right;    Social History   Socioeconomic History  . Marital status: Married    Spouse name: Not on file  . Number of children: Not on file  . Years of education: Not on file  . Highest education level: Not on file  Occupational History  . Not on file  Social Needs  . Financial resource strain: Not on file  . Food insecurity:    Worry: Not on file    Inability: Not on file  . Transportation needs:    Medical: Not on file    Non-medical: Not on file  Tobacco Use  . Smoking status: Never Smoker  . Smokeless tobacco: Never Used  Substance and Sexual Activity  . Alcohol use: Not Currently    Comment: OCCASIONAL   . Drug use: No  . Sexual activity: Not on file  Lifestyle  . Physical activity:    Days per week: Not on file    Minutes per session: Not on file  . Stress: Not on file  Relationships  . Social connections:    Talks on phone: Not on file    Gets together: Not on file    Attends religious service: Not on file    Active member of club or organization: Not on file    Attends meetings of clubs or organizations: Not on  file    Relationship status: Not on file  . Intimate partner violence:    Fear of current or ex partner: Not on file    Emotionally abused: Not on file    Physically abused: Not on file    Forced sexual activity: Not on file  Other Topics Concern  . Not on file  Social History Narrative  . Not on file     Family History  Problem Relation Age of Onset  . Hypertension Mother   . Diabetes Mother   . Hypertension Father   . Diabetes Father   . COPD Father   . Stroke Father   . Congestive Heart Failure Father      Current Facility-Administered Medications:  .  acetaminophen (TYLENOL) tablet 650 mg, 650 mg, Oral, Q6H PRN, 650 mg at 02/17/18 2130 **OR** acetaminophen (TYLENOL) suppository 650 mg, 650 mg, Rectal, Q6H PRN, Harrie Foreman, MD .  albuterol (PROVENTIL) (2.5 MG/3ML) 0.083% nebulizer solution 2.5 mg, 2.5 mg, Nebulization, Q4H PRN, Harrie Foreman, MD .  apixaban Arne Cleveland) tablet 10 mg, 10 mg, Oral, BID, 10 mg at 02/17/18 2130 **FOLLOWED BY** [START ON 02/23/2018] apixaban (ELIQUIS) tablet 5 mg, 5 mg, Oral, BID, Stark Jock, Jude, MD .  aspirin chewable tablet 81 mg, 81 mg, Oral, Q M,W,F, Harrie Foreman, MD, 81 mg at 02/16/18 0901 .  calcium carbonate (TUMS - dosed in mg elemental calcium) chewable tablet 200 mg of elemental calcium, 1 tablet, Oral, TID, Harrie Foreman, MD, 200 mg of elemental calcium at 02/17/18 2130 .  docusate sodium (COLACE) capsule 100 mg, 100 mg, Oral, BID, Harrie Foreman, MD, 100 mg at 02/17/18 2131 .  FLUoxetine (PROZAC) capsule 20 mg, 20 mg, Oral, Daily, Harrie Foreman, MD, 20 mg at 02/17/18 0907 .  insulin aspart (novoLOG) injection 0-5 Units, 0-5 Units, Subcutaneous, QHS, Mody, Sital, MD .  insulin aspart (novoLOG) injection 0-9 Units, 0-9 Units, Subcutaneous, TID WC, Mody, Sital, MD, 2 Units at 02/17/18 1732 .  loratadine (CLARITIN) tablet 10 mg, 10 mg, Oral, Daily, Harrie Foreman, MD, 10 mg at 02/17/18 0908 .  ondansetron (ZOFRAN)  tablet 4 mg, 4 mg, Oral, Q6H PRN **OR** ondansetron (ZOFRAN) injection 4 mg, 4 mg, Intravenous, Q6H PRN, Harrie Foreman, MD .  pantoprazole (PROTONIX) EC tablet 40 mg, 40 mg, Oral, Daily, Harrie Foreman, MD, 40 mg at 02/17/18 0908 .  protein supplement (PREMIER PROTEIN) liquid - approved for s/p bariatric surgery, 11 oz, Oral, BID BM, [place], Jude, MD .  sodium chloride flush (NS) 0.9 % injection 10-40 mL, 10-40 mL, Intracatheter, Q12H, Harrie Foreman, MD, 10 mL at 02/17/18 2131 .  sodium chloride flush (NS) 0.9 % injection 10-40 mL, 10-40 mL, Intracatheter, PRN, Harrie Foreman, MD   Physical exam:  Vitals:   02/17/18 0544 02/17/18 0803 02/17/18 1506 02/17/18 1957  BP: 103/69 95/73  110/78  Pulse: (!) 106 (!) 106 (!) 116 (!) 108  Resp: 16 19  16   Temp: (!) 97.5 F (36.4 C) 98.3 F (36.8 C)  99.3 F (37.4 C)  TempSrc: Oral Oral  Oral  SpO2: 93% 95% 97% 91%  Weight: (!) 319 lb 6.4 oz (144.9 kg)     Height:       Physical Exam  Constitutional: She is oriented to person, place, and time. No distress.  Morbid obese  HENT:  Head: Normocephalic and atraumatic.  Nose: Nose normal.  Mouth/Throat: Oropharynx is clear and moist. No oropharyngeal exudate.  Eyes: Pupils are equal, round, and reactive to light. EOM are normal. No scleral icterus.  Neck: Normal range of motion. Neck supple.  Cardiovascular: Normal rate and regular rhythm.  No murmur heard. Pulmonary/Chest: Effort normal. No respiratory distress. She has no rales. She exhibits no tenderness.  Abdominal: Soft. She exhibits no distension. There is no abdominal tenderness.  Musculoskeletal: Normal range of motion.        General: No edema.  Neurological: She is alert and oriented to person, place, and time. No cranial nerve deficit. She exhibits normal muscle tone. Coordination normal.  Skin: Skin is warm and dry. She is not diaphoretic. No erythema.  Psychiatric: Affect normal.        CMP Latest Ref Rng &  Units 02/17/2018  Glucose 70 - 99 mg/dL 186(H)  BUN 6 - 20 mg/dL 43(H)  Creatinine 0.44 - 1.00 mg/dL 1.70(H)  Sodium 135 - 145 mmol/L 136  Potassium 3.5 - 5.1 mmol/L 4.5  Chloride 98 - 111 mmol/L 111  CO2 22 - 32 mmol/L 17(L)  Calcium 8.9 - 10.3 mg/dL 8.6(L)  Total Protein 6.5 - 8.1 g/dL -  Total Bilirubin 0.3 - 1.2 mg/dL -  Alkaline Phos 38 - 126 U/L -  AST 15 - 41 U/L -  ALT 0 - 44 U/L -   CBC Latest Ref Rng & Units 02/17/2018  WBC 4.0 - 10.5 K/uL 9.8  Hemoglobin 12.0 - 15.0 g/dL 9.7(L)  Hematocrit 36.0 - 46.0 % 32.4(L)  Platelets 150 - 400 K/uL 440(H)   RADIOGRAPHIC STUDIES: I have personally reviewed the radiological images as listed and agreed with the findings in the report. Dg Chest 2 View  Result Date: 02/13/2018 CLINICAL DATA:  Gastric sleeve surgery performed 2 weeks ago, now weak and fatigued, syncopal episode EXAM: CHEST - 2 VIEW COMPARISON:  Chest x-ray of 12/27/2017 FINDINGS: No active infiltrate or effusion is seen. Mediastinal and hilar contours are unremarkable. The heart is within upper limits of normal. No acute bony abnormality is seen. IMPRESSION: No active cardiopulmonary disease. Electronically Signed   By: Ivar Drape M.D.   On: 02/13/2018 16:28   Ct Head Wo Contrast  Result Date: 02/13/2018 CLINICAL DATA:  Syncope.  Recent gastric bypass surgery. EXAM: CT HEAD WITHOUT CONTRAST TECHNIQUE: Contiguous axial images were obtained from the base of the skull through the vertex without intravenous contrast. COMPARISON:  None. FINDINGS: Brain: No evidence of acute infarction, hemorrhage, hydrocephalus, extra-axial collection or mass lesion/mass effect. Vascular: No hyperdense vessel or unexpected calcification. Skull: Normal. Negative for fracture or focal lesion. Sinuses/Orbits: No acute finding. Other: None. IMPRESSION: 1. Normal noncontrast head CT. Electronically Signed   By: Titus Dubin M.D.   On: 02/13/2018 16:38   US Renal  Result Date: 02/14/2018 CLINICAL  DATA:  Acute renal failure. EXAM: RENAL / URINARY TRACT ULTRASOUND COMPLETE COMPARISON:  None. FINDINGS: Right Kidney: Renal measurements: 9.6 x 4.0 x 4.3 cm = volume: 85.7 mL . Echogenicity within normal limits. No mass or hydronephrosis visualized. Left Kidney: Renal measurements: 10.4 x 4.4 x 4.0 cm = volume: 96.9 mL. Normal renal cortical thickness and echogenicity. No hydronephrosis. Within the midpole of the left kidney there is a 1.8 x 1.9 x 1.9 cm hypoechoic masslike structure. Bladder: Appears normal for degree of bladder distention. IMPRESSION: No hydronephrosis. Indeterminate hypoechoic masslike structure within the midpole of the left kidney. Recommend dedicated evaluation with pre and post contrast-enhanced CT when patient clinically able. Electronically Signed   By: Lovey Newcomer M.D.   On: 02/14/2018 15:34   Nm Pulmonary Perf And Vent  Result Date: 02/15/2018 CLINICAL DATA:  Inpatient.  NSTEMI.  Syncopal episode. EXAM: NUCLEAR MEDICINE VENTILATION - PERFUSION LUNG SCAN TECHNIQUE: Ventilation images were obtained in multiple projections using inhaled aerosol Tc-33m DTPA. Perfusion images were obtained in multiple projections after intravenous injection of Tc-64m MAA. Lateral views could not be obtained due to patient body habitus. RADIOPHARMACEUTICALS:  33.6 mCi of Tc-93m DTPA aerosol inhalation and 4.0 mCi Tc41m MAA IV COMPARISON:  02/13/2018 chest radiograph. FINDINGS: Ventilation: Heterogeneous radiotracer uptake throughout the lungs on the ventilation images suggesting airways disease. Perfusion: There are several large segmental perfusion defects throughout both lungs, several of which are unmatched compared to the ventilation images and which have no correlate on the chest radiograph. IMPRESSION: High probability for pulmonary embolism (greater than 80%) per PIOPED II criteria. Electronically Signed   By: Ilona Sorrel M.D.   On: 02/15/2018 18:05   US Venous Img Lower Bilateral  Result Date:  02/15/2018 CLINICAL DATA:  Rule out DVT.  Concern for thromboembolic disease. EXAM: BILATERAL LOWER EXTREMITY VENOUS DOPPLER ULTRASOUND TECHNIQUE: Gray-scale sonography with graded compression, as well as color Doppler and duplex ultrasound were performed to evaluate the  lower extremity deep venous systems from the level of the common femoral vein and including the common femoral, femoral, profunda femoral, popliteal and calf veins including the posterior tibial, peroneal and gastrocnemius veins when visible. The superficial great saphenous vein was also interrogated. Spectral Doppler was utilized to evaluate flow at rest and with distal augmentation maneuvers in the common femoral, femoral and popliteal veins. COMPARISON:  None. FINDINGS: RIGHT LOWER EXTREMITY Common Femoral Vein: Difficult to evaluate because the patient had recent heart catheterization and the right groin was covered with a bandage. However, the distal common femoral artery is compressible with color Doppler flow. Saphenofemoral Junction: Not visualized due to the bandage. Profunda Femoral Vein: No evidence of thrombus. Normal compressibility and flow on color Doppler imaging. Femoral Vein: No evidence of thrombus. Normal compressibility, respiratory phasicity and response to augmentation. Popliteal Vein: No evidence of thrombus. Normal compressibility, respiratory phasicity and response to augmentation. Calf Veins: No evidence of thrombus. Normal compressibility and flow on color Doppler imaging. Other Findings:  None. LEFT LOWER EXTREMITY Common Femoral Vein: No evidence of thrombus. Normal compressibility, respiratory phasicity and response to augmentation. Saphenofemoral Junction: No evidence of thrombus. Normal compressibility and flow on color Doppler imaging. Profunda Femoral Vein: No evidence of thrombus. Normal compressibility and flow on color Doppler imaging. Femoral Vein: No evidence of thrombus. Normal compressibility, respiratory  phasicity and response to augmentation. Incidentally, there appears to be prominent valves along the proximal femoral vein but this vessel is totally compressible. Popliteal Vein: No evidence of thrombus. Normal compressibility, respiratory phasicity and response to augmentation. Calf Veins: No evidence of thrombus. Normal compressibility and flow on color Doppler imaging. Other Findings:  None. IMPRESSION: No evidence of deep venous thrombosis. Partial visualization of the right common femoral vein due to the right groin bandage. Electronically Signed   By: Markus Daft M.D.   On: 02/15/2018 17:31    Assessment and plan- Patient is a 53 y.o. female with history of morbid obesity status post laparoscopic sleeve gastrectomy on 01/29/2018, diabetes, hypertension, presented to the emergency room for evaluation of syncope episodes.  Status post cardiac cath which showed increased pulmonary hypertension.  Normal coronary artery disease.  VQ scan showed high probability of PE.  #Pulmonary embolism, possible provoked by recent laparoscopic sleeve gastrectomy.  Morbid obesity may also contribute. Agree with continuing anticoagulation with Eliquis 10 mg twice daily for 7 days followed by 5 mg twice daily Duration of anticoagulation: Recommend at least 6 months of anticoagulation Plan to check hypercoagulable states outpatient. We also discussed about possibility of continuing maintenance Eliquis 2.5 mg twice daily after she finished 6 months of full anticoagulation. Patient can follow-up outpatient with me few weeks after discharge.  #Anemia, normocytic. Patient has baseline chronic anemia, on admission with hemoglobin 11, which trended down to 9.7 today. Check retic panel. Further evaluation can be done in outpatient setting.   # Thrombocytosis, acute on chronic. Monitor. Outpatient work up   Thank you for allowing me to participate in the care of this patient.  Total face to face encounter time for this  patient visit was 70 min. >50% of the time was  spent in counseling and coordination of care.    Earlie Server, MD, PhD Hematology Oncology [facility] Pager- [phone] 02/17/2018

## 2018-02-17 NOTE — Progress Notes (Signed)
Physical Therapy Treatment Patient Details Name: Kristen [name] MRN: [id number] DOB: 1965/07/28 Today's Date: 02/17/2018    History of Present Illness 53 year old female who was admitted initially with diagnosis of non-ST MI elevation.  Patient subsequently had a syncopal episode for which CODE BLUE was called and had transient episode of chest compressions before patient woke up.  She has since had a cardiac catheterization and been diagnosed with PE (on heparin drip) and at time of PT exam had not been up since Code episode.  Pt is s/p bariatic sx 01/29/18, total hip 6 months ago.    PT Comments    Pt in bed, motivated to participate stating she is going to walk further today.  HR at rest 113.  Session started with supine exercises as below.  HR increased to 126.  Rest given during session but HR continued to gradually increase.  Rest after session returned HR to 110's.  She did want to get up to chair but shared my concern over HR.  She was able to get out of bed and transfer to recliner at bedside with walker and min guard.  She reported general fatigue.  Further activity deferred at this time due to HR.  Discussed with primary nurse.    Pt remains motivated to participate in session and to return home.  She knows she is far from her baseline.  CIR remains appropriate.     Follow Up Recommendations  CIR     Equipment Recommendations  None recommended by PT    Recommendations for Other Services       Precautions / Restrictions Precautions Precautions: Fall Restrictions Weight Bearing Restrictions: No    Mobility  Bed Mobility Overal bed mobility: Modified Independent                Transfers Overall transfer level: Needs assistance Equipment used: Rolling walker (2 wheeled) Transfers: Sit to/from Stand Sit to Stand: Min guard            Ambulation/Gait Ambulation/Gait assistance: Min guard Gait Distance (Feet): 3 Feet Assistive device: Rolling walker (2  wheeled) Gait Pattern/deviations: Step-to pattern         Stairs             Wheelchair Mobility    Modified Rankin (Stroke Patients Only)       Balance Overall balance assessment: Needs assistance Sitting-balance support: Feet supported Sitting balance-Leahy Scale: Good     Standing balance support: Bilateral upper extremity supported Standing balance-Leahy Scale: Fair                              Cognition Arousal/Alertness: Awake/alert Behavior During Therapy: WFL for tasks assessed/performed Overall Cognitive Status: Within Functional Limits for tasks assessed                                        Exercises Other Exercises Other Exercises: supine arom ankle pumps, SLR, heel slides, ab/add quad and glut sets x 10    General Comments        Pertinent Vitals/Pain Pain Assessment: 0-10 Pain Score: 2  Pain Location: R rib pain from CPR - better today Pain Descriptors / Indicators: Sore Pain Intervention(s): Limited activity within patient's tolerance;Monitored during session    Home Living  Prior Function            PT Goals (current goals can now be found in the care plan section) Progress towards PT goals: Progressing toward goals    Frequency    Min 2X/week      PT Plan Current plan remains appropriate    Co-evaluation              AM-PAC PT "6 Clicks" Mobility   Outcome Measure  Help needed turning from your back to your side while in a flat bed without using bedrails?: None Help needed moving from lying on your back to sitting on the side of a flat bed without using bedrails?: None Help needed moving to and from a bed to a chair (including a wheelchair)?: A Little Help needed standing up from a chair using your arms (e.g., wheelchair or bedside chair)?: A Little Help needed to walk in hospital room?: A Little Help needed climbing 3-5 steps with a railing? : A Lot 6  Click Score: 19    End of Session Equipment Utilized During Treatment: Gait belt;Oxygen Activity Tolerance: Treatment limited secondary to medical complications (Comment) Patient left: in chair;with call bell/phone within reach Nurse Communication: Other (comment)       Time: 8833-7445 PT Time Calculation (min) (ACUTE ONLY): 15 min  Charges:  $Therapeutic Exercise: 8-22 mins                     Chesley Noon, PTA 02/17/18, 12:05 PM

## 2018-02-18 DIAGNOSIS — D509 Iron deficiency anemia, unspecified: Secondary | ICD-10-CM

## 2018-02-18 LAB — RETIC PANEL
Immature Retic Fract: 36.7 % — ABNORMAL HIGH (ref 2.3–15.9)
RBC.: 3.78 MIL/uL — ABNORMAL LOW (ref 3.87–5.11)
Retic Count, Absolute: 102.1 10*3/uL (ref 19.0–186.0)
Retic Ct Pct: 2.7 % (ref 0.4–3.1)
Reticulocyte Hemoglobin: 25.2 pg — ABNORMAL LOW (ref >27.9)

## 2018-02-18 LAB — GLUCOSE, CAPILLARY
Glucose-Capillary: 159 mg/dL — ABNORMAL HIGH (ref 70–99)
Glucose-Capillary: 206 mg/dL — ABNORMAL HIGH (ref 70–99)
Glucose-Capillary: 166 mg/dL — ABNORMAL HIGH (ref 70–99)
Glucose-Capillary: 233 mg/dL — ABNORMAL HIGH (ref 70–99)

## 2018-02-18 MED ORDER — INSULIN GLARGINE 100 UNIT/ML ~~LOC~~ SOLN
8.0000 [IU] | Freq: Every day | SUBCUTANEOUS | Status: DC
  Administered 2018-02-18: 8 [IU] via SUBCUTANEOUS
  Filled 2018-02-18 (×2): qty 0.08

## 2018-02-18 MED ORDER — FERROUS SULFATE 325 (65 FE) MG PO TABS
325.0000 mg | ORAL_TABLET | Freq: Two times a day (BID) | ORAL | Status: DC
  Administered 2018-02-19: 325 mg via ORAL
  Filled 2018-02-18: qty 1

## 2018-02-18 NOTE — Progress Notes (Signed)
Hematology/Oncology Progress Note [facility] Telephone:([phone] [phone]) [phone]  Patient Care Team: Glendale Chard, MD as PCP - General (Internal Medicine)   Name of the patient: Kristen [name]  [id number]  22-Sep-1968  Date of visit: 02/18/18   INTERVAL HISTORY-  Reports feeling better today. Still weak, mild SOB.  Family and friends at bedside.    Review of systems- Review of Systems  Constitutional: Positive for fatigue. Negative for appetite change, chills and fever.  HENT:   Negative for hearing loss and voice change.   Eyes: Negative for eye problems.  Respiratory: Positive for shortness of breath. Negative for chest tightness and cough.   Cardiovascular: Negative for chest pain.  Gastrointestinal: Negative for abdominal distention, abdominal pain and blood in stool.  Endocrine: Negative for hot flashes.  Genitourinary: Negative for difficulty urinating and frequency.   Musculoskeletal: Negative for arthralgias.  Skin: Negative for itching and rash.  Neurological: Negative for extremity weakness.  Hematological: Negative for adenopathy.  Psychiatric/Behavioral: Negative for confusion.    Allergies  Allergen Reactions  . Amoxicillin Hives    Has patient had a PCN reaction causing immediate rash, facial/tongue/throat swelling, SOB or lightheadedness with hypotension: No Has patient had a PCN reaction causing severe rash involving mucus membranes or skin necrosis: Yes Has patient had a PCN reaction that required hospitalization No Has patient had a PCN reaction occurring within the last 10 years: No If all of the above answers are "NO", then may proceed with Cephalosporin use.   . Fish Allergy Hives    Patient Active Problem List   Diagnosis Date Noted  . Acute pulmonary embolism ([place])   . Acute renal failure ([place])   . Anemia   . Thrombocytosis ([place])   . NSTEMI (non-ST elevated myocardial infarction) ([place]) 02/13/2018  . Status  post gastrectomy 02/05/2018  . Essential hypertension 02/05/2018  . Type 2 diabetes mellitus without complication, without long-term current use of insulin ([place]) 02/05/2018  . Obesity 01/29/2018  . Status post total replacement of right hip 08/04/2017  . Unilateral primary osteoarthritis, right knee 07/06/2016  . Unilateral primary osteoarthritis, right hip 07/06/2016  . Acute pain of right knee 07/06/2016  . Pain in right hip 07/06/2016     Past Medical History:  Diagnosis Date  . Arthritis   . Diabetes ([place])    Type 2  . Hypertension   . URI (upper respiratory infection)    took antibiotic and prednisone oct 2019 resolved  . Uses contact lenses      Past Surgical History:  Procedure Laterality Date  . COLONOSCOPY WITH PROPOFOL N/A 03/18/2016   Procedure: COLONOSCOPY WITH PROPOFOL;  Surgeon: Carol Ada, MD;  Location: [facility];  Service: Endoscopy;  Laterality: N/A;  . LAPAROSCOPIC GASTRIC SLEEVE RESECTION N/A 01/29/2018   Procedure: LAPAROSCOPIC GASTRIC SLEEVE RESECTION WITH UPPER ENDO AND ERAS PATHWAY;  Surgeon: Kinsinger, Arta Bruce, MD;  Location: [facility];  Service: General;  Laterality: N/A;  . RIGHT/LEFT HEART CATH AND CORONARY ANGIOGRAPHY N/A 02/15/2018   Procedure: RIGHT/LEFT HEART CATH AND CORONARY ANGIOGRAPHY;  Surgeon: Corey Skains, MD;  Location: [facility];  Service: Cardiovascular;  Laterality: N/A;  . TOTAL HIP ARTHROPLASTY Right 08/04/2017   Procedure: RIGHT TOTAL HIP ARTHROPLASTY ANTERIOR APPROACH;  Surgeon: Mcarthur Rossetti, MD;  Location: [facility];  Service: Orthopedics;  Laterality: Right;    Social History   Socioeconomic History  . Marital status: Married    Spouse name: Not on file  .  Number of children: Not on file  . Years of education: Not on file  . Highest education level: Not on file  Occupational History  . Not on file  Social Needs  . Financial resource strain: Not on file  . Food insecurity:    Worry: Not on file     Inability: Not on file  . Transportation needs:    Medical: Not on file    Non-medical: Not on file  Tobacco Use  . Smoking status: Never Smoker  . Smokeless tobacco: Never Used  Substance and Sexual Activity  . Alcohol use: Not Currently    Comment: OCCASIONAL   . Drug use: No  . Sexual activity: Not on file  Lifestyle  . Physical activity:    Days per week: Not on file    Minutes per session: Not on file  . Stress: Not on file  Relationships  . Social connections:    Talks on phone: Not on file    Gets together: Not on file    Attends religious service: Not on file    Active member of club or organization: Not on file    Attends meetings of clubs or organizations: Not on file    Relationship status: Not on file  . Intimate partner violence:    Fear of current or ex partner: Not on file    Emotionally abused: Not on file    Physically abused: Not on file    Forced sexual activity: Not on file  Other Topics Concern  . Not on file  Social History Narrative  . Not on file     Family History  Problem Relation Age of Onset  . Hypertension Mother   . Diabetes Mother   . Hypertension Father   . Diabetes Father   . COPD Father   . Stroke Father   . Congestive Heart Failure Father      Current Facility-Administered Medications:  .  acetaminophen (TYLENOL) tablet 650 mg, 650 mg, Oral, Q6H PRN, 650 mg at 02/17/18 2130 **OR** acetaminophen (TYLENOL) suppository 650 mg, 650 mg, Rectal, Q6H PRN, Harrie Foreman, MD .  albuterol (PROVENTIL) (2.5 MG/3ML) 0.083% nebulizer solution 2.5 mg, 2.5 mg, Nebulization, Q4H PRN, Harrie Foreman, MD .  apixaban Arne Cleveland) tablet 10 mg, 10 mg, Oral, BID, 10 mg at 02/18/18 0852 **FOLLOWED BY** [START ON 02/23/2018] apixaban (ELIQUIS) tablet 5 mg, 5 mg, Oral, BID, Ojie, Jude, MD .  aspirin chewable tablet 81 mg, 81 mg, Oral, Q M,W,F, Harrie Foreman, MD, 81 mg at 02/16/18 0901 .  calcium carbonate (TUMS - dosed in mg elemental calcium)  chewable tablet 200 mg of elemental calcium, 1 tablet, Oral, TID, Harrie Foreman, MD, 200 mg of elemental calcium at 02/18/18 1653 .  docusate sodium (COLACE) capsule 100 mg, 100 mg, Oral, BID, Harrie Foreman, MD, 100 mg at 02/18/18 0853 .  [START ON 02/19/2018] ferrous sulfate tablet 325 mg, 325 mg, Oral, BID WC, Earlie Server, MD .  FLUoxetine (PROZAC) capsule 20 mg, 20 mg, Oral, Daily, Harrie Foreman, MD, 20 mg at 02/18/18 8338 .  insulin aspart (novoLOG) injection 0-5 Units, 0-5 Units, Subcutaneous, QHS, Mody, Sital, MD .  insulin aspart (novoLOG) injection 0-9 Units, 0-9 Units, Subcutaneous, TID WC, Mody, Sital, MD, 2 Units at 02/18/18 1653 .  insulin glargine (LANTUS) injection 8 Units, 8 Units, Subcutaneous, QHS, Demetrios Loll, MD .  loratadine (CLARITIN) tablet 10 mg, 10 mg, Oral, Daily, Harrie Foreman, MD, 10  mg at 02/18/18 0852 .  ondansetron (ZOFRAN) tablet 4 mg, 4 mg, Oral, Q6H PRN **OR** ondansetron (ZOFRAN) injection 4 mg, 4 mg, Intravenous, Q6H PRN, Harrie Foreman, MD .  pantoprazole (PROTONIX) EC tablet 40 mg, 40 mg, Oral, Daily, Harrie Foreman, MD, 40 mg at 02/18/18 3016 .  protein supplement (PREMIER PROTEIN) liquid - approved for s/p bariatric surgery, 11 oz, Oral, BID BM, Ojie, Jude, MD .  sodium chloride flush (NS) 0.9 % injection 10-40 mL, 10-40 mL, Intracatheter, Q12H, Harrie Foreman, MD, 10 mL at 02/18/18 0856 .  sodium chloride flush (NS) 0.9 % injection 10-40 mL, 10-40 mL, Intracatheter, PRN, Harrie Foreman, MD   Physical exam:  Vitals:   02/18/18 0400 02/18/18 0838 02/18/18 1607 02/18/18 2010  BP: 112/84 99/87 106/72 114/85  Pulse: (!) 102 62 (!) 105 (!) 107  Resp: 16 20 16    Temp: (!) 97.5 F (36.4 C) 98.4 F (36.9 C) 98.3 F (36.8 C) 99.1 F (37.3 C)  TempSrc: Oral Oral Oral Oral  SpO2: 94% 93% 100% 98%  Weight:      Height:       Physical Exam  Constitutional: She is oriented to person, place, and time. No distress.  Morbidly obese.   HENT:  Head: Normocephalic and atraumatic.  Mouth/Throat: No oropharyngeal exudate.  Eyes: Pupils are equal, round, and reactive to light. EOM are normal. No scleral icterus.  Neck: Normal range of motion. Neck supple.  Cardiovascular: Regular rhythm.  No murmur heard. tachycardia  Pulmonary/Chest: Effort normal. No respiratory distress. She has no rales. She exhibits no tenderness.  Abdominal: Soft. She exhibits no distension. There is no abdominal tenderness.  Musculoskeletal: Normal range of motion.        General: No edema.  Neurological: She is alert and oriented to person, place, and time. No cranial nerve deficit. She exhibits normal muscle tone. Coordination normal.  Skin: Skin is warm and dry. She is not diaphoretic. No erythema.  Psychiatric: Affect normal.       CMP Latest Ref Rng & Units 02/17/2018  Glucose 70 - 99 mg/dL 186(H)  BUN 6 - 20 mg/dL 43(H)  Creatinine 0.44 - 1.00 mg/dL 1.70(H)  Sodium 135 - 145 mmol/L 136  Potassium 3.5 - 5.1 mmol/L 4.5  Chloride 98 - 111 mmol/L 111  CO2 22 - 32 mmol/L 17(L)  Calcium 8.9 - 10.3 mg/dL 8.6(L)  Total Protein 6.5 - 8.1 g/dL -  Total Bilirubin 0.3 - 1.2 mg/dL -  Alkaline Phos 38 - 126 U/L -  AST 15 - 41 U/L -  ALT 0 - 44 U/L -   CBC Latest Ref Rng & Units 02/17/2018  WBC 4.0 - 10.5 K/uL 9.8  Hemoglobin 12.0 - 15.0 g/dL 9.7(L)  Hematocrit 36.0 - 46.0 % 32.4(L)  Platelets 150 - 400 K/uL 440(H)   RADIOGRAPHIC STUDIES: I have personally reviewed the radiological images as listed and agreed with the findings in the report. Dg Chest 2 View  Result Date: 02/13/2018 CLINICAL DATA:  Gastric sleeve surgery performed 2 weeks ago, now weak and fatigued, syncopal episode EXAM: CHEST - 2 VIEW COMPARISON:  Chest x-ray of 12/27/2017 FINDINGS: No active infiltrate or effusion is seen. Mediastinal and hilar contours are unremarkable. The heart is within upper limits of normal. No acute bony abnormality is seen. IMPRESSION: No active  cardiopulmonary disease. Electronically Signed   By: Ivar Drape M.D.   On: 02/13/2018 16:28   Ct Head Wo Contrast  Result  Date: 02/13/2018 CLINICAL DATA:  Syncope.  Recent gastric bypass surgery. EXAM: CT HEAD WITHOUT CONTRAST TECHNIQUE: Contiguous axial images were obtained from the base of the skull through the vertex without intravenous contrast. COMPARISON:  None. FINDINGS: Brain: No evidence of acute infarction, hemorrhage, hydrocephalus, extra-axial collection or mass lesion/mass effect. Vascular: No hyperdense vessel or unexpected calcification. Skull: Normal. Negative for fracture or focal lesion. Sinuses/Orbits: No acute finding. Other: None. IMPRESSION: 1. Normal noncontrast head CT. Electronically Signed   By: Titus Dubin M.D.   On: 02/13/2018 16:38   US Renal  Result Date: 02/14/2018 CLINICAL DATA:  Acute renal failure. EXAM: RENAL / URINARY TRACT ULTRASOUND COMPLETE COMPARISON:  None. FINDINGS: Right Kidney: Renal measurements: 9.6 x 4.0 x 4.3 cm = volume: 85.7 mL . Echogenicity within normal limits. No mass or hydronephrosis visualized. Left Kidney: Renal measurements: 10.4 x 4.4 x 4.0 cm = volume: 96.9 mL. Normal renal cortical thickness and echogenicity. No hydronephrosis. Within the midpole of the left kidney there is a 1.8 x 1.9 x 1.9 cm hypoechoic masslike structure. Bladder: Appears normal for degree of bladder distention. IMPRESSION: No hydronephrosis. Indeterminate hypoechoic masslike structure within the midpole of the left kidney. Recommend dedicated evaluation with pre and post contrast-enhanced CT when patient clinically able. Electronically Signed   By: Lovey Newcomer M.D.   On: 02/14/2018 15:34   Nm Pulmonary Perf And Vent  Result Date: 02/15/2018 CLINICAL DATA:  Inpatient.  NSTEMI.  Syncopal episode. EXAM: NUCLEAR MEDICINE VENTILATION - PERFUSION LUNG SCAN TECHNIQUE: Ventilation images were obtained in multiple projections using inhaled aerosol Tc-66m DTPA. Perfusion images  were obtained in multiple projections after intravenous injection of Tc-34m MAA. Lateral views could not be obtained due to patient body habitus. RADIOPHARMACEUTICALS:  33.6 mCi of Tc-65m DTPA aerosol inhalation and 4.0 mCi Tc68m MAA IV COMPARISON:  02/13/2018 chest radiograph. FINDINGS: Ventilation: Heterogeneous radiotracer uptake throughout the lungs on the ventilation images suggesting airways disease. Perfusion: There are several large segmental perfusion defects throughout both lungs, several of which are unmatched compared to the ventilation images and which have no correlate on the chest radiograph. IMPRESSION: High probability for pulmonary embolism (greater than 80%) per PIOPED II criteria. Electronically Signed   By: Ilona Sorrel M.D.   On: 02/15/2018 18:05   US Venous Img Lower Bilateral  Result Date: 02/15/2018 CLINICAL DATA:  Rule out DVT.  Concern for thromboembolic disease. EXAM: BILATERAL LOWER EXTREMITY VENOUS DOPPLER ULTRASOUND TECHNIQUE: Gray-scale sonography with graded compression, as well as color Doppler and duplex ultrasound were performed to evaluate the lower extremity deep venous systems from the level of the common femoral vein and including the common femoral, femoral, profunda femoral, popliteal and calf veins including the posterior tibial, peroneal and gastrocnemius veins when visible. The superficial great saphenous vein was also interrogated. Spectral Doppler was utilized to evaluate flow at rest and with distal augmentation maneuvers in the common femoral, femoral and popliteal veins. COMPARISON:  None. FINDINGS: RIGHT LOWER EXTREMITY Common Femoral Vein: Difficult to evaluate because the patient had recent heart catheterization and the right groin was covered with a bandage. However, the distal common femoral artery is compressible with color Doppler flow. Saphenofemoral Junction: Not visualized due to the bandage. Profunda Femoral Vein: No evidence of thrombus. Normal  compressibility and flow on color Doppler imaging. Femoral Vein: No evidence of thrombus. Normal compressibility, respiratory phasicity and response to augmentation. Popliteal Vein: No evidence of thrombus. Normal compressibility, respiratory phasicity and response to augmentation. Calf Veins: No evidence  of thrombus. Normal compressibility and flow on color Doppler imaging. Other Findings:  None. LEFT LOWER EXTREMITY Common Femoral Vein: No evidence of thrombus. Normal compressibility, respiratory phasicity and response to augmentation. Saphenofemoral Junction: No evidence of thrombus. Normal compressibility and flow on color Doppler imaging. Profunda Femoral Vein: No evidence of thrombus. Normal compressibility and flow on color Doppler imaging. Femoral Vein: No evidence of thrombus. Normal compressibility, respiratory phasicity and response to augmentation. Incidentally, there appears to be prominent valves along the proximal femoral vein but this vessel is totally compressible. Popliteal Vein: No evidence of thrombus. Normal compressibility, respiratory phasicity and response to augmentation. Calf Veins: No evidence of thrombus. Normal compressibility and flow on color Doppler imaging. Other Findings:  None. IMPRESSION: No evidence of deep venous thrombosis. Partial visualization of the right common femoral vein due to the right groin bandage. Electronically Signed   By: Markus Daft M.D.   On: 02/15/2018 17:31    Assessment and plan-  Patient is a 53 y.o. female with history of morbid obesity status post laparoscopic sleeve gastrectomy on 01/29/2018, diabetes, hypertension, presented to the emergency room for evaluation of syncope episodes.  Status post cardiac cath which showed increased pulmonary hypertension.  Normal coronary artery disease.  VQ scan showed high probability of PE.  #Pulmonary embolism, possible provoked by recent laparoscopic sleeve gastrectomy.  Morbid obesity may also  contribute. Continue anticoagulation with Eliquis 10mg  BID with 7 days course followed by Eliquis 5mg  BID.  Outpatient work up for hypercoagulable state.   # Anemia, reticulocyte hemoglobin is decreased, consistent with iron deficiency. Thrombocytosis also can be due to iron deficiency.  I will hold off checking iron panel at this time as ferritin can be falsely elevated.  Recommend start oral ferrous sulfate 325mg  BID. Further evaluation can be done in outpatient setting.  Thank you for allowing me to participate in the care of this patient.   Earlie Server, MD, PhD Hematology Oncology [facility] at [facility] Pager- [phone] 02/18/2018

## 2018-02-18 NOTE — Progress Notes (Addendum)
[facility] [place] NAME: Kristen [name]    MR#:  [id number]  DATE OF BIRTH:  10/24/1965  SUBJECTIVE:  Generalized weakness, mild shortness of breath, tachycardia on exertion, on oxygen 1 L while walking. REVIEW OF SYSTEMS:  Review of Systems  Constitutional: Positive for malaise/fatigue. Negative for chills, fever and weight loss.  HENT: Negative for hearing loss and tinnitus.   Eyes: Negative for blurred vision and double vision.  Respiratory: Positive for shortness of breath. Negative for cough and hemoptysis.   Cardiovascular: Negative for chest pain and leg swelling.  Gastrointestinal: Negative for heartburn.  Genitourinary: Negative for dysuria and urgency.  Musculoskeletal: Negative for myalgias and neck pain.  Skin: Negative for itching and rash.  Neurological: Negative for dizziness and headaches.  Psychiatric/Behavioral: Negative for depression. The patient is not nervous/anxious.     DRUG ALLERGIES:   Allergies  Allergen Reactions  . Amoxicillin Hives    Has patient had a PCN reaction causing immediate rash, facial/tongue/throat swelling, SOB or lightheadedness with hypotension: No Has patient had a PCN reaction causing severe rash involving mucus membranes or skin necrosis: Yes Has patient had a PCN reaction that required hospitalization No Has patient had a PCN reaction occurring within the last 10 years: No If all of the above answers are "NO", then may proceed with Cephalosporin use.   . Fish Allergy Hives   VITALS:  Blood pressure 99/87, pulse 62, temperature 98.4 F (36.9 C), temperature source Oral, resp. rate 20, height 5\' 4"  (1.626 m), weight (!) 144.9 kg, SpO2 93 %. PHYSICAL EXAMINATION:     Physical Exam  Constitutional: She is oriented to person, place, and time and well-developed, well-nourished, and in no distress. No distress.  Morbid obesity  HENT:  Head: Normocephalic and atraumatic.    Mouth/Throat: Oropharynx is clear and moist.  Eyes: Pupils are equal, round, and reactive to light. Conjunctivae and EOM are normal. No scleral icterus.  Neck: Normal range of motion. Neck supple. No JVD present. No tracheal deviation present.  Cardiovascular: Regular rhythm and normal heart sounds. Exam reveals no gallop.  No murmur heard. Tachycardia.  Pulmonary/Chest: Effort normal and breath sounds normal. No respiratory distress. She has no wheezes. She has no rales.  Abdominal: Soft. Bowel sounds are normal. She exhibits no distension. There is no abdominal tenderness. There is no rebound.  Musculoskeletal: Normal range of motion.        General: No tenderness or edema.  Neurological: She is alert and oriented to person, place, and time. No cranial nerve deficit.  Skin: Skin is warm and dry. No rash noted. No erythema.  Psychiatric: Affect and judgment normal.   LABORATORY PANEL:  Female CBC Recent Labs  Lab 02/17/18 0037  WBC 9.8  HGB 9.7*  HCT 32.4*  PLT 440*   ------------------------------------------------------------------------------------------------------------------ Chemistries  Recent Labs  Lab 02/14/18 1750  02/17/18 0037  NA  --    < > 136  K  --    < > 4.5  CL  --    < > 111  CO2  --    < > 17*  GLUCOSE  --    < > 186*  BUN  --    < > 43*  CREATININE  --    < > 1.70*  CALCIUM  --    < > 8.6*  MG  --    < > 2.4  AST 229*  --   --  ALT 174*  --   --   ALKPHOS 121  --   --   BILITOT 1.1  --   --    < > = values in this interval not displayed.   RADIOLOGY:  No results found. ASSESSMENT AND PLAN:   Patient is a 53 year old female who was admitted initially with diagnosis of non-ST MI elevation.  Patient subsequently had a syncopal episode for which CODE BLUE was called and had transient episode of chest compressions before patient woke up and started talking.  1.  NSTEMI: Elevated troponins and ST changes.  Patient reported to have had a transient  period of unresponsiveness and CODE BLUE was called on 02/13/2018.  Patient had brief chest compressions and subsequently woke up and started talking.  No evidence of cardiac arrest.  Was felt to have had a syncopal episode.  Blood sugar was 141.  Vitals were stable. Patient seen by cardiologist.  Recommendation is to continue supportive care for possible non-ST MI elevation with heparin drip and aspirin and beta-blocker. Patient had cardiac catheterization done by cardiologist showing hypertrophic cardiomyopathy with hyperdynamic ejection fraction of 70% normal coronary arteries and significantly elevated pulmonary pressures.  Recommendation is to continue heparin while undergoing further evaluation for possible pulmonary embolism.   Pulmonary embolism. Due to patient having cardiac catheterization with contrast exposure, No CTA chest to avoid acute kidney injury.  Venous Doppler ultrasound done was negative for DVT.  VQ scan came back as high probability for PE.  Patient was on heparin drip, changed to Eliquis at 10 mg twice daily for the first 7 days followed by 5 mg twice daily for treatment of acute PE.   She needs anticoagulation with Eliquis for 6 months, then may need Eliquis 2.5 mg twice daily, hypercoagulation work-up as outpatient per Dr. Tasia Catchings.  Acute respiratory failure with hypoxia due to above. The patient still has hypoxia and tachycardia on exertion. Continue above treatment.  Try to wean off oxygen on exertion.  2.  Acute kidney injury: Holding home regimen of olmesartan, hydrochlorothiazide. She is treated with IV fluid hydration.  Renal function improved and fairly stable.  Follow-up nephrologist as outpatient.  3.  Hypertension: Controlled; hold olmesartan due to softer blood pressure.  4.  Diabetes mellitus type 2: Hold oral hyperglycemic agents.  Sliding scale insulin while hospitalized  5.    Generalized weakness.  Ambulate the patient. The patient was very independent at  home. But this time she may need home health and PT.  Morbid obesity.  Diet control and follow-up with PCP.  S/p gastric bypass.  Management plans discussed with the patient, family and they are in agreement. Time spent on admission orders and patient care approximately 25 minutes.  CODE STATUS: Full Code  More than 50% of the time was spent in counseling/coordination of care: YES  POSSIBLE D/C in 2 days.  Demetrios Loll M.D on 02/18/2018 at 1:52 PM  After 6pm go to [email or web address] - Proofreader  [facility]  Office  [phone]  CC: Primary care physician; Glendale Chard, MD  Note: This dictation was prepared with Dragon dictation along with smaller phrase technology. Any transcriptional errors that result from this process are unintentional.

## 2018-02-18 NOTE — Progress Notes (Signed)
[facility]  ROUNDING NOTE   Subjective:   Patient doing well and has no complains.   UOP 5102mL recorded  No new labs today  Objective:  Vital signs in last 24 hours:  Temp:  [97.5 F (36.4 C)-99.3 F (37.4 C)] 98.4 F (36.9 C) (01/05 0838) Pulse Rate:  [62-116] 62 (01/05 0838) Resp:  [16-20] 20 (01/05 0838) BP: (99-112)/(78-87) 99/87 (01/05 0838) SpO2:  [91 %-97 %] 93 % (01/05 0838)  Weight change:  Filed Weights   02/15/18 1250 02/16/18 0515 02/17/18 0544  Weight: (!) 137.9 kg (!) 144.9 kg (!) 144.9 kg    Intake/Output: I/O last 3 completed shifts: In: -  Out: 550 [Urine:550]   Intake/Output this shift:  No intake/output data recorded.  Physical Exam: General: NAD  Head: Normocephalic, atraumatic. Moist oral mucosal membranes  Eyes: Anicteric, PERRL  Neck: Supple, trachea midline  Lungs:  Clear to auscultation, 1 L [place] O2  Heart: Regular rate and rhythm  Abdomen:  Soft, nontender,   Extremities:  no peripheral edema.  Neurologic: Nonfocal, moving all four extremities  Skin: No lesions         Basic Metabolic Panel: Recent Labs  Lab 02/13/18 0311 02/13/18 1640 02/15/18 0620 02/16/18 0816 02/17/18 0037  NA 137 137 140 139 136  K 3.7 4.6 4.5 4.5 4.5  CL 104 108 113* 114* 111  CO2 18* 20* 17* 16* 17*  GLUCOSE 265* 201* 166* 187* 186*  BUN 39* 39* 47* 42* 43*  CREATININE 2.09* 1.93* 1.95* 1.73* 1.70*  CALCIUM 9.0 8.8* 8.7* 8.7* 8.6*  MG 2.1 2.3 2.3 2.3 2.4  PHOS  --   --  3.7 4.0 3.5    Liver Function Tests: Recent Labs  Lab 02/14/18 1750 02/17/18 0037  AST 229*  --   ALT 174*  --   ALKPHOS 121  --   BILITOT 1.1  --   PROT 7.7  --   ALBUMIN 3.5 3.4*   No results for input(s): LIPASE, AMYLASE in the last 168 hours. No results for input(s): AMMONIA in the last 168 hours.  CBC: Recent Labs  Lab 02/13/18 0311 02/14/18 0307 02/15/18 0620 02/16/18 0816 02/17/18 0037  WBC 12.8* 9.6 10.0 9.6 9.8  HGB 11.0* 10.3* 10.1*  10.0* 9.7*  HCT 35.7* 33.7* 32.9* 32.2* 32.4*  MCV 83.8 82.6 84.4 82.8 83.9  PLT 468* 424* 413* 442* 440*    Cardiac Enzymes: Recent Labs  Lab 02/13/18 0311 02/13/18 0355 02/13/18 0906 02/13/18 1640 02/13/18 1946  TROPONINI 0.89* 1.28* 1.51* 1.82* 1.72*    BNP: Invalid input(s): POCBNP  CBG: Recent Labs  Lab 02/17/18 1141 02/17/18 1724 02/17/18 2136 02/18/18 0840 02/18/18 1135  GLUCAP 200* 175* 194* 159* 95*    Microbiology: Results for orders placed or performed during the hospital encounter of 07/31/17  Surgical pcr screen     Status: None   Collection Time: 07/31/17  9:40 AM  Result Value Ref Range Status   MRSA, PCR NEGATIVE NEGATIVE Final   Staphylococcus aureus NEGATIVE NEGATIVE Final    Comment: (NOTE) The Xpert SA Assay (FDA approved for NASAL specimens in patients 108 years of age and older), is one component of a comprehensive surveillance program. It is not intended to diagnose infection nor to guide or monitor treatment. Performed at [facility], Rosa Sanchez [address], [place],  [address]     Coagulation Studies: No results for input(s): LABPROT, INR in the last 72 hours.  Urinalysis: No results  for input(s): COLORURINE, LABSPEC, [place], GLUCOSEU, HGBUR, BILIRUBINUR, KETONESUR, PROTEINUR, UROBILINOGEN, NITRITE, LEUKOCYTESUR in the last 72 hours.  Invalid input(s): APPERANCEUR    Imaging: No results found.   Medications:    . apixaban  10 mg Oral BID   Followed by  . [START ON 02/23/2018] apixaban  5 mg Oral BID  . aspirin  81 mg Oral Q M,W,F  . calcium carbonate  1 tablet Oral TID  . docusate sodium  100 mg Oral BID  . FLUoxetine  20 mg Oral Daily  . insulin aspart  0-5 Units Subcutaneous QHS  . insulin aspart  0-9 Units Subcutaneous TID WC  . loratadine  10 mg Oral Daily  . pantoprazole  40 mg Oral Daily  . protein supplement shake  11 oz Oral BID BM  . sodium chloride flush  10-40 mL Intracatheter Q12H    acetaminophen **OR** acetaminophen, albuterol, ondansetron **OR** ondansetron (ZOFRAN) IV, sodium chloride flush  Assessment/ Plan:  Ms. DORISSA STINNETTE is a 53 y.o. black female with hypertension, diabetes mellitus type II, morbid obesity status post gastric sleeve 01/29/18 Dr. Redmond Pulling, right hip surgery, who was admitted to [facility] on 02/13/2018 for NSTEMI and pulmonary embolism. Started on eliquis.   1. Acute renal failure on chronic kidney disease stage III with proteinuria: baseline creatinine of 1.22, GFR of 59 on 01/30/18.  Chronic kidney disease secondary to diabetes and hypertension.  Acute renal failure from hypotension and possible over medication.  - Holding home regimen of olmesartan, hydrochlorothiazide  2. Hypotension with syncope:  holding home blood pressure regimen of olmesartan and hydrochlorothiazide.  - discontinued gabapentin - holding metoprolol.   3. Diabetes mellitus type II with chronic kidney disease. On metformin at home.  Hemoglobin A1c of 8.7% on 01/24/18.  - holding metformin.  - Currently with sliding scale.   4. Left renal mass: incidental finding on ultrasound  - Check CT without contrast and outpatient follow up   LOS: 5 Adriann Ballweg 1/5/20201:20 PM

## 2018-02-19 LAB — BASIC METABOLIC PANEL
Anion gap: 9 (ref 5–15)
BUN: 32 mg/dL — ABNORMAL HIGH (ref 6–20)
CO2: 20 mmol/L — ABNORMAL LOW (ref 22–32)
Calcium: 8.9 mg/dL (ref 8.9–10.3)
Chloride: 108 mmol/L (ref 98–111)
Creatinine, Ser: 1.42 mg/dL — ABNORMAL HIGH (ref 0.44–1.00)
GFR calc Af Amer: 49 mL/min — ABNORMAL LOW (ref >60)
GFR calc non Af Amer: 42 mL/min — ABNORMAL LOW (ref >60)
Glucose, Bld: 175 mg/dL — ABNORMAL HIGH (ref 70–99)
Potassium: 4.3 mmol/L (ref 3.5–5.1)
Sodium: 137 mmol/L (ref 135–145)

## 2018-02-19 LAB — GLUCOSE, CAPILLARY
Glucose-Capillary: 157 mg/dL — ABNORMAL HIGH (ref 70–99)
Glucose-Capillary: 182 mg/dL — ABNORMAL HIGH (ref 70–99)

## 2018-02-19 MED ORDER — APIXABAN 5 MG PO TABS: ORAL_TABLET | ORAL | 1 refills | Status: DC

## 2018-02-19 MED ORDER — FERROUS SULFATE 325 (65 FE) MG PO TABS: 325.0000 mg | ORAL_TABLET | Freq: Two times a day (BID) | ORAL | 0 refills | Status: DC

## 2018-02-19 NOTE — Progress Notes (Signed)
Physical Therapy Treatment Patient Details Name: Kristen [name] MRN: [id number] DOB: Nov 17, 1965 Today's Date: 02/19/2018    History of Present Illness 53 year old female who was admitted initially with diagnosis of non-ST MI elevation.  Patient subsequently had a syncopal episode for which [facility] was called and had transient episode of chest compressions before patient woke up.  She has since had a cardiac catheterization and been diagnosed with PE (on heparin drip).  Pt is s/p bariatic sx 01/29/18, total hip 6 months ago., anterior approach.    PT Comments    Pt able to ambulate 30 feet no AD but demonstrating decreased B step length and decreased cadence; pt stopped to rest briefly in standing (SOB noted) before ambulating 30 feet back to room with bariatric RW.  Improved step length and cadence (and pt appearing stronger in general) ambulating with bariatric RW (pt verbalizing feeling stronger ambulating with walker).  Pt noted to have increased SOB during and end of ambulation (unable to get O2 sats reading initially but after about a minute O2 sats 90% on room air and with cueing for pursed lip breathing O2 sats quickly increased to 94% on room air).  HR increased from 107 bpm at rest to 135 bpm with activity (nurse notified).  Pt reports she has bariatric RW at home already that she plans to use upon hospital discharge and plans to discharge to her mom's home (1 level, no stairs) where the walker will fit in home.  Pt educated on pacing and modifications d/t SOB/decreased activity tolerance: pt verbalizing appropriate understanding.  PT recommendations updated to HHPT with intermittent assist as needed; CM notified.      Follow Up Recommendations  Home health PT;Other (comment)(Intermittent assist as needed)     Equipment Recommendations  Rolling walker with 5" wheels(Bariatric RW (pt reports already has one))    Recommendations for Other Services       Precautions / Restrictions  Precautions Precautions: Fall;Anterior Hip Precaution Comments: R THA Restrictions Weight Bearing Restrictions: No    Mobility  Bed Mobility               General bed mobility comments: Deferred (pt sitting up in chair beginning and end of session)  Transfers Overall transfer level: Needs assistance Equipment used: None Transfers: Sit to/from Stand Sit to Stand: Supervision         General transfer comment: mild increased effort to stand from recliner but overall steady and safe; no UE support once standing  Ambulation/Gait Ambulation/Gait assistance: Min guard Gait Distance (Feet): 60 Feet Assistive device: None;Rolling walker (2 wheeled)       General Gait Details: decreased B step length with decreased cadence ambulating 30 feet no AD; partial step through gait pattern with increased cadence 30 feet with bariatric RW   Stairs             Wheelchair Mobility    Modified Rankin (Stroke Patients Only)       Balance Overall balance assessment: Needs assistance Sitting-balance support: No upper extremity supported;Feet supported Sitting balance-Leahy Scale: Normal Sitting balance - Comments: steady sitting donning B socks   Standing balance support: No upper extremity supported Standing balance-Leahy Scale: Good Standing balance comment: steady standing reaching within BOS                            Cognition Arousal/Alertness: Awake/alert Behavior During Therapy: WFL for tasks assessed/performed Overall Cognitive Status: Within Functional Limits  for tasks assessed                                        Exercises      General Comments  Pt agreeable to PT session.      Pertinent Vitals/Pain Pain Assessment: 0-10 Pain Score: 3  Pain Location: R rib pain from CPR - better today Pain Descriptors / Indicators: Sore Pain Intervention(s): Limited activity within patient's tolerance;Monitored during  session;Repositioned;Patient requesting pain meds-RN notified    Home Living                      Prior Function            PT Goals (current goals can now be found in the care plan section) Acute Rehab PT Goals Patient Stated Goal: Patient would like to return to her prior level of independence  PT Goal Formulation: With patient Time For Goal Achievement: 03/02/18 Potential to Achieve Goals: Good Progress towards PT goals: Progressing toward goals    Frequency    Min 2X/week      PT Plan Discharge plan needs to be updated    Co-evaluation              AM-PAC PT "6 Clicks" Mobility   Outcome Measure  Help needed turning from your back to your side while in a flat bed without using bedrails?: None Help needed moving from lying on your back to sitting on the side of a flat bed without using bedrails?: None Help needed moving to and from a bed to a chair (including a wheelchair)?: A Little Help needed standing up from a chair using your arms (e.g., wheelchair or bedside chair)?: A Little Help needed to walk in hospital room?: A Little Help needed climbing 3-5 steps with a railing? : A Little 6 Click Score: 20    End of Session Equipment Utilized During Treatment: Gait belt Activity Tolerance: Patient limited by fatigue;Other (comment)(Limited d/t SOB) Patient left: in chair;with call bell/phone within reach Nurse Communication: Mobility status;Precautions;Other (comment)(Pt's HR elevated up to 135 bpm during session.  Nurse cleared pt to not have chair alarm on (alarm not on upon PT entry either)) PT Visit Diagnosis: Other abnormalities of gait and mobility (R26.89);Muscle weakness (generalized) (M62.81);Difficulty in walking, not elsewhere classified (R26.2)     Time: 1140-1210 PT Time Calculation (min) (ACUTE ONLY): 30 min  Charges:  $Gait Training: 8-22 mins $Therapeutic Activity: 8-22 mins                     Leitha Bleak, PT 02/19/18, 12:28  PM [phone]

## 2018-02-19 NOTE — Care Management Note (Signed)
Case Management Note  Patient Details  Name: WAUNETTA RIGGLE MRN: [id number] Date of Birth: 04/20/1965  Subjective/Objective:      Patient will be discharging to her mother's home for a few weeks.  Offered home health list with ratings and patient states she would like to use [facility]; she used to work for them.  Referral made to [facility] with [facility] for PT and accepted.  Her mother's add is [address] [place], [place] [address].  They have activated the Eliquis coupon and prescription is ready for pickup per patient.  No further needs at this time.               Action/Plan:   Expected Discharge Date:  02/19/18               Expected Discharge Plan:  [place]  In-House Referral:     Discharge planning Services  CM Consult  Post Acute Care Choice:    Choice offered to:  Patient  DME Arranged:    DME Agency:     HH Arranged:  PT [place]:  [facility] (now [facility])  Status of Service:  Completed, signed off  If discussed at [employer] of Stay Meetings, dates discussed:    Additional Comments:  Elza Rafter, RN 02/19/2018, 12:28 PM

## 2018-02-19 NOTE — Progress Notes (Signed)
Pt discharged home today, HH to see at home then to follow up with cardio and pulmonology rehab. Follow up appointment information provided IV's removed, medication instructions education and scheudle given to patient. VSS no complaints.

## 2018-02-19 NOTE — Progress Notes (Signed)
OT Cancellation Note  Patient Details Name: Kristen [name] MRN: [id number] DOB: [birth year]   Cancelled Treatment:    Reason Eval/Treat Not Completed: Other (comment). Chart reviewed. Pt noted with discharge orders and summary. Spoke with RN, pt discharging imminently, not available for OT treatment. Will re-attempt as appropriate.   Jeni Salles, MPH, MS, OTR/L ascom [phone] 02/19/18, 3:21 PM

## 2018-02-19 NOTE — Progress Notes (Signed)
Cardiovascular and Pulmonary Nurse Navigator Note:  53 year old female with hx of morbid obesity s/p laparoscopic sleeve gastrectomy on 01/29/2018, diabetes, HTN who presented to the ED for evaluation of syncopal episodes.  Patient admitted with the initial dx of NSTEMI. Troponin max 1.82.   Cardiac Cath performed which revealed normal coronary arteries.     Panel Physicians Referring Physician Case Authorizing Physician  Corey Skains, MD (Primary)  Corey Skains, MD  Procedures   RIGHT/LEFT HEART CATH AND CORONARY ANGIOGRAPHY  Conclusion     Hemodynamic findings consistent with severe pulmonary hypertension.    Cardiac Catheterization Procedure Note  Name: [name] MRN: [id number] DOB: February 04, 1966  Procedure: Right Heart Cath, Left Heart Cath, Selective Coronary Angiography, LV angiography   Patient admitted for non-ST elevation myocardial infarction with elevated troponin abnormal EKG with tachycardia and rhythm disturbances now improved on heparin but still weak and fatigued and short of breath  Left ventricular ejection fraction 70% hyperdynamic with hypertrophic cardiomyopathy  Normal coronary arteries with 0% stenosis  Significantly elevated end-diastolic pressure as well as pulmonary pressures consistent with above and possible pulmonary embolism  Plan 1.  Continue and restart heparin for possible pulmonary embolism and because of significant clinical issues listed above 2.  Beta-blocker for heart rate control and hypertrophic cardiomyopathy 3.  CT angiogram when able for further evaluation of extent of pulmonary embolism 4.  No further cardiac diagnostics necessary at this time   VQ Scan showed high probability of PE.    Patient has been followed by Hematology Oncology, Nephrology, and Cardiology in addition to Hospitalists this admission.    Rounded on patient to discuss Cardiac Rehab. Rationale for patient being referred to Cardiac Rehab  explained - NSTEMI / Type 2 MI secondary to pulmonary embolism / pulmonary HTN.   Overview of the program provided.  Patient s/p sleeve gastrectomy on January 29, 2018 at [facility] in [place], [place].  Patient stated she is not to exercise for 6 weeks.  Patient was evaluated for CIR inpatient rehab at [facility], but they feel she will progress quickly and is no longer a candidate.  Patient for discharge today.  Patient to have [place] with PT. Explained to patient after she has completed in-home PT then the next step would be Cardiac Rehab.  Information sheet, CPT billing codes, and brochure provided to patient.  Patient stated she is interested in participating but needs to wait the recommended six weeks before starting the program.    Roanna Epley, RN, BSN, [facility]  Cardiovascular & Pulmonary Nurse Navigator  Direct Line: [phone]  Department Phone #: [phone] Fax: [phone]  Email Address: [email or web address]

## 2018-02-19 NOTE — Discharge Summary (Signed)
[facility] [place] NAME: Kristen [name]    MR#:  [id number]  DATE OF BIRTH:  September 18, 1965  DATE OF ADMISSION:  02/13/2018   ADMITTING PHYSICIAN: Harrie Foreman, MD  DATE OF DISCHARGE:  02/19/2018  PRIMARY CARE PHYSICIAN: Glendale Chard, MD   ADMISSION DIAGNOSIS:  Dehydration [E86.0] Abnormal EKG [R94.31] NSTEMI (non-ST elevated myocardial infarction) ([place]) [I21.4] Elevated troponin I level [R79.89] Acute renal failure, unspecified acute renal failure type ([place]) [N17.9] DISCHARGE DIAGNOSIS:  Active Problems:   NSTEMI (non-ST elevated myocardial infarction) ([place]   Acute pulmonary embolism ([facility])   Acute renal failure ([facility])   Anemia   Thrombocytosis ([place])  SECONDARY DIAGNOSIS:   Past Medical History:  Diagnosis Date  . Arthritis   . Diabetes ([place])    Type 2  . Hypertension   . URI (upper respiratory infection)    took antibiotic and prednisone oct 2019 resolved  . Uses contact lenses    HOSPITAL COURSE:  Patient is a 53 year old female who was admitted initially with diagnosis of non-ST MI elevation.  Patient subsequently had a syncopal episode for which [facility] was called and had transient episode of chest compressions before patient woke up and started talking.  1. NSTEMI: Elevated troponins and ST changes.  Patient reported to have had a transient period of unresponsiveness and [facility] was called on 02/13/2018.  Patient had brief chest compressions and subsequently woke up and started talking.  No evidence of cardiac arrest.  Was felt to have had a syncopal episode.  Blood sugar was 141.  Vitals were stable. Patient seen by cardiologist.  Recommendation is to continue supportive care for possible non-ST MI elevation with heparin drip and aspirin and beta-blocker. Patient had cardiac catheterization done by cardiologist showing hypertrophic cardiomyopathy with hyperdynamic ejection fraction of 70% normal coronary  arteries and significantly elevated pulmonary pressures.  Recommendation is to continue heparin while undergoing further evaluation for possible pulmonary embolism.   Pulmonary embolism. Due to patient having cardiac catheterization with contrast exposure, No CTA chest to avoid acute kidney injury.  Venous Doppler ultrasound done was negative for DVT.  VQ scan came back as high probability for PE.  Patient was on heparin drip, changed to Eliquis at 10 mg twice daily for the first 7 days followed by 5 mg twice daily for treatment of acute PE.   She needs anticoagulation with Eliquis for 6 months, then may need Eliquis 2.5 mg twice daily, hypercoagulation work-up as outpatient per Dr. Tasia Catchings.  Acute respiratory failure with hypoxia due to above. The patient still has hypoxia and tachycardia on exertion. Continue above treatment.  Try to wean off oxygen on exertion.  2. Acute kidney injury: Holding home regimen of olmesartan, hydrochlorothiazide. She is treated with IV fluid hydration.  Renal function improved and fairly stable.  Follow-up nephrologist as outpatient.  3. Hypertension: Controlled; hold olmesartan due to softer blood pressure.  4. Diabetes mellitus type 2: Hold oral hyperglycemic agents. Sliding scale insulin while hospitalized  5.   Generalized weakness.  Ambulate the patient. The patient was very independent at home. But this time she may need home health and PT. Anemia of chronic disease.  Iron pills twice daily per Dr. Tasia Catchings. Morbid obesity.  Diet control and follow-up with PCP.  S/p gastric bypass. DISCHARGE CONDITIONS:  Stable, discharge to home with HHPT today. CONSULTS OBTAINED:  Treatment Team:  Lavonia Dana, MD Corey Skains, MD Earlie Server, MD DRUG ALLERGIES:  Allergies  Allergen Reactions  . Amoxicillin Hives    Has patient had a PCN reaction causing immediate rash, facial/tongue/throat swelling, SOB or lightheadedness with hypotension: No Has  patient had a PCN reaction causing severe rash involving mucus membranes or skin necrosis: Yes Has patient had a PCN reaction that required hospitalization No Has patient had a PCN reaction occurring within the last 10 years: No If all of the above answers are "NO", then may proceed with Cephalosporin use.   . Fish Allergy Hives   DISCHARGE MEDICATIONS:   Allergies as of 02/19/2018      Reactions   Amoxicillin Hives   Has patient had a PCN reaction causing immediate rash, facial/tongue/throat swelling, SOB or lightheadedness with hypotension: No Has patient had a PCN reaction causing severe rash involving mucus membranes or skin necrosis: Yes Has patient had a PCN reaction that required hospitalization No Has patient had a PCN reaction occurring within the last 10 years: No If all of the above answers are "NO", then may proceed with Cephalosporin use.   Fish Allergy Hives      Medication List    STOP taking these medications   metFORMIN 500 MG 24 hr tablet Commonly known as:  GLUCOPHAGE XR   olmesartan-hydrochlorothiazide 40-25 MG tablet Commonly known as:  BENICAR HCT     TAKE these medications   albuterol 108 (90 Base) MCG/ACT inhaler Commonly known as:  PROVENTIL HFA;VENTOLIN HFA Inhale 1-2 puffs into the lungs every 6 (six) hours as needed for wheezing or shortness of breath.   apixaban 5 MG Tabs tablet Commonly known as:  ELIQUIS 10 mg po bid for 4 days, then 5 mg po bid.   aspirin 81 MG chewable tablet Chew 1 tablet (81 mg total) by mouth 2 (two) times daily. What changed:  when to take this   BARIATRIC MULTIVITAMINS/IRON PO Take 1 tablet by mouth 2 (two) times daily.   blood glucose meter kit and supplies Kit Dispense based on patient and insurance preference. Use up to four times daily as directed. (FOR ICD-9 250.00, 250.01).   calcium carbonate 500 MG chewable tablet Commonly known as:  TUMS - dosed in mg elemental calcium Chew 1 tablet by mouth 3 (three)  times daily.   cetirizine 10 MG tablet Commonly known as:  ZYRTEC Take 10 mg by mouth daily.   ferrous sulfate 325 (65 FE) MG tablet Take 1 tablet (325 mg total) by mouth 2 (two) times daily with a meal.   FLUoxetine 20 MG tablet Commonly known as:  PROZAC Take 1 tablet (20 mg total) by mouth daily.   gabapentin 300 MG capsule Commonly known as:  NEURONTIN Take 1 capsule (300 mg total) by mouth 2 (two) times daily.   ondansetron 4 MG disintegrating tablet Commonly known as:  ZOFRAN-ODT Take 1 tablet (4 mg total) by mouth every 6 (six) hours as needed for nausea or vomiting.   pantoprazole 40 MG tablet Commonly known as:  PROTONIX Take 1 tablet (40 mg total) by mouth daily.        DISCHARGE INSTRUCTIONS:  See AVS.  If you experience worsening of your admission symptoms, develop shortness of breath, life threatening emergency, suicidal or homicidal thoughts you must seek medical attention immediately by calling 911 or calling your MD immediately  if symptoms less severe.  You Must read complete instructions/literature along with all the possible adverse reactions/side effects for all the Medicines you take and that have been prescribed to you. Take any  new Medicines after you have completely understood and accpet all the possible adverse reactions/side effects.   Please note  You were cared for by a hospitalist during your hospital stay. If you have any questions about your discharge medications or the care you received while you were in the hospital after you are discharged, you can call the unit and asked to speak with the hospitalist on call if the hospitalist that took care of you is not available. Once you are discharged, your primary care physician will handle any further medical issues. Please note that NO REFILLS for any discharge medications will be authorized once you are discharged, as it is imperative that you return to your primary care physician (or establish a  relationship with a primary care physician if you do not have one) for your aftercare needs so that they can reassess your need for medications and monitor your lab values.    On the day of Discharge:  VITAL SIGNS:  Blood pressure 105/81, pulse (!) 101, temperature 98.4 F (36.9 C), temperature source Oral, resp. rate 20, height '5\' 4"'  (1.626 m), weight (!) 139.2 kg, SpO2 95 %. PHYSICAL EXAMINATION:  GENERAL:  53 y.o.-year-old patient lying in the bed with no acute distress. Morbid obesity. EYES: Pupils equal, round, reactive to light and accommodation. No scleral icterus. Extraocular muscles intact.  HEENT: Head atraumatic, normocephalic. Oropharynx and nasopharynx clear.  NECK:  Supple, no jugular venous distention. No thyroid enlargement, no tenderness.  LUNGS: Normal breath sounds bilaterally, no wheezing, rales,rhonchi or crepitation. No use of accessory muscles of respiration.  CARDIOVASCULAR: S1, S2 normal. No murmurs, rubs, or gallops.  ABDOMEN: Soft, non-tender, non-distended. Bowel sounds present. No organomegaly or mass.  EXTREMITIES: No pedal edema, cyanosis, or clubbing.  NEUROLOGIC: Cranial nerves II through XII are intact. Muscle strength 4/5 in all extremities. Sensation intact. Gait not checked.  PSYCHIATRIC: The patient is alert and oriented x 3.  SKIN: No obvious rash, lesion, or ulcer.  DATA REVIEW:   CBC Recent Labs  Lab 02/17/18 0037  WBC 9.8  HGB 9.7*  HCT 32.4*  PLT 440*    Chemistries  Recent Labs  Lab 02/14/18 1750  02/17/18 0037 02/19/18 0639  NA  --    < > 136 137  K  --    < > 4.5 4.3  CL  --    < > 111 108  CO2  --    < > 17* 20*  GLUCOSE  --    < > 186* 175*  BUN  --    < > 43* 32*  CREATININE  --    < > 1.70* 1.42*  CALCIUM  --    < > 8.6* 8.9  MG  --    < > 2.4  --   AST 229*  --   --   --   ALT 174*  --   --   --   ALKPHOS 121  --   --   --   BILITOT 1.1  --   --   --    < > = values in this interval not displayed.      Microbiology Results  Results for orders placed or performed during the hospital encounter of 07/31/17  Surgical pcr screen     Status: None   Collection Time: 07/31/17  9:40 AM  Result Value Ref Range Status   MRSA, PCR NEGATIVE NEGATIVE Final   Staphylococcus aureus NEGATIVE NEGATIVE Final    Comment: (  NOTE) The Xpert SA Assay (FDA approved for NASAL specimens in patients 54 years of age and older), is one component of a comprehensive surveillance program. It is not intended to diagnose infection nor to guide or monitor treatment. Performed at [facility], [address], [place], [place] [address]     RADIOLOGY:  No results found.   Management plans discussed with the patient, family and they are in agreement.  CODE STATUS: Full Code   TOTAL TIME TAKING CARE OF THIS PATIENT: 32 minutes.    Demetrios Loll M.D on 02/19/2018 at 12:10 PM  Between 7am to 6pm - Pager - [phone]  After 6pm go to [email or web address] - Proofreader  [facility]  Office  [phone]  CC: Primary care physician; Glendale Chard, MD   Note: This dictation was prepared with Dragon dictation along with smaller phrase technology. Any transcriptional errors that result from this process are unintentional.

## 2018-02-19 NOTE — Progress Notes (Signed)
Rehab Admissions Coordinator Note:  Per PT and OT recommendation, this patient was screened by Jhonnie Garner for appropriateness for an Inpatient Acute Rehab Consult.  At this time, anticipate pt will progress quickly and not require CIR. AC will sign off.    Jhonnie Garner 02/19/2018, 9:12 AM  I can be reached at [phone].

## 2018-02-19 NOTE — Discharge Instructions (Signed)
HHPT °

## 2018-02-20 ENCOUNTER — Other Ambulatory Visit: Payer: Self-pay | Admitting: *Deleted

## 2018-02-20 DIAGNOSIS — D473 Essential (hemorrhagic) thrombocythemia: Secondary | ICD-10-CM

## 2018-02-20 DIAGNOSIS — D75839 Thrombocytosis, unspecified: Secondary | ICD-10-CM

## 2018-02-20 DIAGNOSIS — I2699 Other pulmonary embolism without acute cor pulmonale: Secondary | ICD-10-CM

## 2018-02-27 ENCOUNTER — Encounter: Payer: Self-pay | Admitting: Internal Medicine

## 2018-02-27 ENCOUNTER — Ambulatory Visit (INDEPENDENT_AMBULATORY_CARE_PROVIDER_SITE_OTHER): Payer: BLUE CROSS/BLUE SHIELD | Admitting: Internal Medicine

## 2018-02-27 VITALS — BP 110/86 | HR 95 | Temp 98.2°F | Ht 64.0 in | Wt 317.6 lb

## 2018-02-27 DIAGNOSIS — Z09 Encounter for follow-up examination after completed treatment for conditions other than malignant neoplasm: Secondary | ICD-10-CM

## 2018-02-27 DIAGNOSIS — N289 Disorder of kidney and ureter, unspecified: Secondary | ICD-10-CM

## 2018-02-27 DIAGNOSIS — I2699 Other pulmonary embolism without acute cor pulmonale: Secondary | ICD-10-CM | POA: Diagnosis not present

## 2018-02-27 DIAGNOSIS — Z9884 Bariatric surgery status: Secondary | ICD-10-CM

## 2018-02-27 DIAGNOSIS — Z6841 Body Mass Index (BMI) 40.0 and over, adult: Secondary | ICD-10-CM

## 2018-02-28 LAB — BMP8+EGFR
BUN/Creatinine Ratio: 16 (ref 9–23)
BUN: 18 mg/dL (ref 6–24)
CO2: 21 mmol/L (ref 20–29)
Calcium: 9.1 mg/dL (ref 8.7–10.2)
Chloride: 105 mmol/L (ref 96–106)
Creatinine, Ser: 1.15 mg/dL — ABNORMAL HIGH (ref 0.57–1.00)
GFR calc Af Amer: 63 mL/min/{1.73_m2} (ref >59)
GFR calc non Af Amer: 55 mL/min/{1.73_m2} — ABNORMAL LOW (ref >59)
Glucose: 137 mg/dL — ABNORMAL HIGH (ref 65–99)
Potassium: 5 mmol/L (ref 3.5–5.2)
Sodium: 142 mmol/L (ref 134–144)

## 2018-02-28 NOTE — Progress Notes (Signed)
Your kidney function has improved. Be sure to stay well hydrated.   Take care,   RS

## 2018-03-05 ENCOUNTER — Other Ambulatory Visit: Payer: Self-pay | Admitting: Nephrology

## 2018-03-05 DIAGNOSIS — N2889 Other specified disorders of kidney and ureter: Secondary | ICD-10-CM

## 2018-03-07 ENCOUNTER — Inpatient Hospital Stay: Payer: BLUE CROSS/BLUE SHIELD | Attending: Oncology | Admitting: Oncology

## 2018-03-07 ENCOUNTER — Other Ambulatory Visit: Payer: Self-pay

## 2018-03-07 ENCOUNTER — Inpatient Hospital Stay: Payer: BLUE CROSS/BLUE SHIELD

## 2018-03-07 ENCOUNTER — Encounter: Payer: Self-pay | Admitting: *Deleted

## 2018-03-07 ENCOUNTER — Encounter: Payer: Self-pay | Admitting: Oncology

## 2018-03-07 VITALS — BP 151/88 | HR 89 | Temp 97.1°F | Resp 18 | Ht 66.5 in | Wt 309.9 lb

## 2018-03-07 DIAGNOSIS — Z7982 Long term (current) use of aspirin: Secondary | ICD-10-CM | POA: Insufficient documentation

## 2018-03-07 DIAGNOSIS — D473 Essential (hemorrhagic) thrombocythemia: Secondary | ICD-10-CM

## 2018-03-07 DIAGNOSIS — I272 Pulmonary hypertension, unspecified: Secondary | ICD-10-CM | POA: Insufficient documentation

## 2018-03-07 DIAGNOSIS — D500 Iron deficiency anemia secondary to blood loss (chronic): Secondary | ICD-10-CM | POA: Insufficient documentation

## 2018-03-07 DIAGNOSIS — I214 Non-ST elevation (NSTEMI) myocardial infarction: Secondary | ICD-10-CM | POA: Insufficient documentation

## 2018-03-07 DIAGNOSIS — D509 Iron deficiency anemia, unspecified: Secondary | ICD-10-CM

## 2018-03-07 DIAGNOSIS — Z9884 Bariatric surgery status: Secondary | ICD-10-CM | POA: Diagnosis not present

## 2018-03-07 DIAGNOSIS — I2699 Other pulmonary embolism without acute cor pulmonale: Secondary | ICD-10-CM | POA: Insufficient documentation

## 2018-03-07 DIAGNOSIS — D75839 Thrombocytosis, unspecified: Secondary | ICD-10-CM

## 2018-03-07 DIAGNOSIS — Z79899 Other long term (current) drug therapy: Secondary | ICD-10-CM | POA: Diagnosis not present

## 2018-03-07 DIAGNOSIS — Z7901 Long term (current) use of anticoagulants: Secondary | ICD-10-CM | POA: Insufficient documentation

## 2018-03-07 HISTORY — DX: Iron deficiency anemia, unspecified: D50.9

## 2018-03-07 LAB — CBC WITH DIFFERENTIAL/PLATELET
Abs Immature Granulocytes: 0.02 10*3/uL (ref 0.00–0.07)
Basophils Absolute: 0 10*3/uL (ref 0.0–0.1)
Basophils Relative: 0 %
Eosinophils Absolute: 0.1 10*3/uL (ref 0.0–0.5)
Eosinophils Relative: 2 %
HCT: 34.9 % — ABNORMAL LOW (ref 36.0–46.0)
Hemoglobin: 10.4 g/dL — ABNORMAL LOW (ref 12.0–15.0)
Immature Granulocytes: 0 %
Lymphocytes Relative: 34 %
Lymphs Abs: 1.9 10*3/uL (ref 0.7–4.0)
MCH: 25.4 pg — ABNORMAL LOW (ref 26.0–34.0)
MCHC: 29.8 g/dL — ABNORMAL LOW (ref 30.0–36.0)
MCV: 85.1 fL (ref 80.0–100.0)
Monocytes Absolute: 0.5 10*3/uL (ref 0.1–1.0)
Monocytes Relative: 8 %
Neutro Abs: 3.2 10*3/uL (ref 1.7–7.7)
Neutrophils Relative %: 56 %
Platelets: 483 10*3/uL — ABNORMAL HIGH (ref 150–400)
RBC: 4.1 MIL/uL (ref 3.87–5.11)
RDW: 19.4 % — ABNORMAL HIGH (ref 11.5–15.5)
WBC: 5.7 10*3/uL (ref 4.0–10.5)
nRBC: 0 % (ref 0.0–0.2)

## 2018-03-07 LAB — IRON AND TIBC
Iron: 35 ug/dL (ref 28–170)
Saturation Ratios: 9 % — ABNORMAL LOW (ref 10.4–31.8)
TIBC: 404 ug/dL (ref 250–450)
UIBC: 369 ug/dL

## 2018-03-07 LAB — FERRITIN: Ferritin: 24 ng/mL (ref 11–307)

## 2018-03-07 NOTE — Progress Notes (Signed)
Hematology/Oncology Follow Up Note [facility]  [phone] [phone]  Patient Care Team: Glendale Chard, MD as PCP - General (Internal Medicine)   Name of the patient: Kristen [name]  [id number]  21-Oct-1965   REASON FOR VISIT  post hospitalization follow-up for management of pulmonary embolism  PERTINENT HEMATOLOGY HISTORY Kristen [name] is a 53 y.o.afemale who has above oncology history reviewed by me today presented for follow up visit for management of pulmonary embolism.  Patient was seen by me during hospitalization.. Patient presents to ER for evaluation of syncope episode. Patient fainted but did not fall or hurt herself as family member caught patient.  EKG in ED showed ST changed. Troponin elevated and started on heparin drip and admitted.  On day 1 of admission, CODE BLUE was called and patient was unresponsive and received brief chest compression. Patient woke up and talked.  No cardiac arrest.  She reports feeling dizzy and lightheaded and passed out.  Similar to the syncope episode prior to admission. Cardiac catheterization showed normal LV systolic function and hyperdynamic in nature with ejection fraction of 70% consistent with hyper trophic cardiomyopathy.  Significantly elevated pulmonary pressure possibly consistent with pulmonary embolism.  No cardiac intervention due to normal coronary arteries. Patient had VQ scan on 02/15/2018 which showed hypermobility for pulmonary embolism Patient was on heparin drip and was transitioned to Eliquis 10 mg twice daily started on 02/16/2018.  INTERVAL HISTORY Kristen [name] is a 53 y.o.female who has above oncology history reviewed by me today presented for follow up visit for management of pulmonary embolism.  She was discharged from hospital on 02/19/2018.  Currently she is taking Eliquis 5 mg twice daily. Denies hematochezia, hematuria, hematemesis, epistaxis, black tarry  stool or easy bruising.  Denies any chest pain, cough, hemoptysis. She still has some shortness of breath with exertion.  Today she parked her car at the other side of the hospital and has to walk to cancer center.  Feels mild shortness of breath with exertion.   Review of Systems  Constitutional: Negative for appetite change, chills, fatigue and fever.  HENT:   Negative for hearing loss and voice change.   Eyes: Negative for eye problems.  Respiratory: Positive for shortness of breath. Negative for chest tightness and cough.   Cardiovascular: Negative for chest pain.  Gastrointestinal: Negative for abdominal distention, abdominal pain and blood in stool.  Endocrine: Negative for hot flashes.  Genitourinary: Negative for difficulty urinating and frequency.   Musculoskeletal: Negative for arthralgias.  Skin: Negative for itching and rash.  Neurological: Negative for extremity weakness.  Hematological: Negative for adenopathy.  Psychiatric/Behavioral: Negative for confusion.      Allergies  Allergen Reactions  . Amoxicillin Hives    Has patient had a PCN reaction causing immediate rash, facial/tongue/throat swelling, SOB or lightheadedness with hypotension: No Has patient had a PCN reaction causing severe rash involving mucus membranes or skin necrosis: Yes Has patient had a PCN reaction that required hospitalization No Has patient had a PCN reaction occurring within the last 10 years: No If all of the above answers are "NO", then may proceed with Cephalosporin use.   . Fish Allergy Hives     Past Medical History:  Diagnosis Date  . Arthritis   . Diabetes ([place])    Type 2  . Hypertension   . URI (upper respiratory infection)    took antibiotic and prednisone oct 2019 resolved  . Uses contact lenses  Past Surgical History:  Procedure Laterality Date  . COLONOSCOPY WITH PROPOFOL N/A 03/18/2016   Procedure: COLONOSCOPY WITH PROPOFOL;  Surgeon: Carol Ada, MD;   Location: [facility];  Service: Endoscopy;  Laterality: N/A;  . LAPAROSCOPIC GASTRIC SLEEVE RESECTION N/A 01/29/2018   Procedure: LAPAROSCOPIC GASTRIC SLEEVE RESECTION WITH UPPER ENDO AND ERAS PATHWAY;  Surgeon: Kinsinger, Arta Bruce, MD;  Location: [facility];  Service: General;  Laterality: N/A;  . RIGHT/LEFT HEART CATH AND CORONARY ANGIOGRAPHY N/A 02/15/2018   Procedure: RIGHT/LEFT HEART CATH AND CORONARY ANGIOGRAPHY;  Surgeon: Corey Skains, MD;  Location: [facility];  Service: Cardiovascular;  Laterality: N/A;  . TOTAL HIP ARTHROPLASTY Right 08/04/2017   Procedure: RIGHT TOTAL HIP ARTHROPLASTY ANTERIOR APPROACH;  Surgeon: Mcarthur Rossetti, MD;  Location: [facility];  Service: Orthopedics;  Laterality: Right;    Social History   Socioeconomic History  . Marital status: Married    Spouse name: Not on file  . Number of children: Not on file  . Years of education: Not on file  . Highest education level: Not on file  Occupational History  . Not on file  Social Needs  . Financial resource strain: Not on file  . Food insecurity:    Worry: Not on file    Inability: Not on file  . Transportation needs:    Medical: Not on file    Non-medical: Not on file  Tobacco Use  . Smoking status: Never Smoker  . Smokeless tobacco: Never Used  Substance and Sexual Activity  . Alcohol use: Not Currently    Comment: OCCASIONAL   . Drug use: No  . Sexual activity: Not on file  Lifestyle  . Physical activity:    Days per week: Not on file    Minutes per session: Not on file  . Stress: Not on file  Relationships  . Social connections:    Talks on phone: Not on file    Gets together: Not on file    Attends religious service: Not on file    Active member of club or organization: Not on file    Attends meetings of clubs or organizations: Not on file    Relationship status: Not on file  . Intimate partner violence:    Fear of current or ex partner: Not on file    Emotionally  abused: Not on file    Physically abused: Not on file    Forced sexual activity: Not on file  Other Topics Concern  . Not on file  Social History Narrative  . Not on file    Family History  Problem Relation Age of Onset  . Hypertension Mother   . Diabetes Mother   . Hypertension Father   . Diabetes Father   . COPD Father   . Stroke Father   . Congestive Heart Failure Father      Current Outpatient Medications:  .  albuterol (PROVENTIL HFA;VENTOLIN HFA) 108 (90 Base) MCG/ACT inhaler, Inhale 1-2 puffs into the lungs every 6 (six) hours as needed for wheezing or shortness of breath., Disp: 1 Inhaler, Rfl: 2 .  apixaban (ELIQUIS) 5 MG TABS tablet, 10 mg po bid for 4 days, then 5 mg po bid., Disp: 60 tablet, Rfl: 1 .  aspirin 81 MG chewable tablet, Chew 1 tablet (81 mg total) by mouth 2 (two) times daily. (Patient taking differently: Chew 81 mg by mouth every Monday, Wednesday, and Friday. ), Disp: 35 tablet, Rfl: 0 .  blood glucose meter  kit and supplies KIT, Dispense based on patient and insurance preference. Use up to four times daily as directed. (FOR ICD-9 250.00, 250.01)., Disp: 1 each, Rfl: 0 .  calcium carbonate (TUMS - DOSED IN MG ELEMENTAL CALCIUM) 500 MG chewable tablet, Chew 1 tablet by mouth 3 (three) times daily., Disp: , Rfl:  .  cetirizine (ZYRTEC) 10 MG tablet, Take 10 mg by mouth daily., Disp: , Rfl:  .  ferrous sulfate 325 (65 FE) MG tablet, Take 1 tablet (325 mg total) by mouth 2 (two) times daily with a meal., Disp: 60 tablet, Rfl: 0 .  FLUoxetine (PROZAC) 20 MG tablet, Take 1 tablet (20 mg total) by mouth daily., Disp: 90 tablet, Rfl: 1 .  Multiple Vitamins-Minerals (BARIATRIC MULTIVITAMINS/IRON PO), Take 1 tablet by mouth 2 (two) times daily., Disp: , Rfl:  .  ondansetron (ZOFRAN-ODT) 4 MG disintegrating tablet, Take 1 tablet (4 mg total) by mouth every 6 (six) hours as needed for nausea or vomiting., Disp: 20 tablet, Rfl: 0 .  pantoprazole (PROTONIX) 40 MG tablet,  Take 1 tablet (40 mg total) by mouth daily., Disp: 90 tablet, Rfl: 0  Physical exam:  Vitals:   03/07/18 0949  BP: (!) 151/88  Pulse: 89  Resp: 18  Temp: (!) 97.1 F (36.2 C)  TempSrc: Tympanic  SpO2: 96%  Weight: (!) 309 lb 14.4 oz (140.6 kg)  Height: 5' 6.5" (1.689 m)   Physical Exam Constitutional:      General: She is not in acute distress.    Comments: Morbidly obese.   HENT:     Head: Normocephalic and atraumatic.  Eyes:     General: No scleral icterus.    Pupils: Pupils are equal, round, and reactive to light.  Neck:     Musculoskeletal: Normal range of motion and neck supple.  Cardiovascular:     Rate and Rhythm: Normal rate and regular rhythm.     Heart sounds: Normal heart sounds.  Pulmonary:     Effort: Pulmonary effort is normal. No respiratory distress.     Breath sounds: No wheezing.  Abdominal:     General: Bowel sounds are normal. There is no distension.     Palpations: Abdomen is soft. There is no mass.     Tenderness: There is no abdominal tenderness.  Musculoskeletal: Normal range of motion.        General: No deformity.  Skin:    General: Skin is warm and dry.     Findings: No erythema or rash.  Neurological:     Mental Status: She is alert and oriented to person, place, and time.     Cranial Nerves: No cranial nerve deficit.     Coordination: Coordination normal.  Psychiatric:        Behavior: Behavior normal.        Thought Content: Thought content normal.     CMP Latest Ref Rng & Units 02/27/2018  Glucose 65 - 99 mg/dL 137(H)  BUN 6 - 24 mg/dL 18  Creatinine 0.57 - 1.00 mg/dL 1.15(H)  Sodium 134 - 144 mmol/L 142  Potassium 3.5 - 5.2 mmol/L 5.0  Chloride 96 - 106 mmol/L 105  CO2 20 - 29 mmol/L 21  Calcium 8.7 - 10.2 mg/dL 9.1  Total Protein 6.5 - 8.1 g/dL -  Total Bilirubin 0.3 - 1.2 mg/dL -  Alkaline Phos 38 - 126 U/L -  AST 15 - 41 U/L -  ALT 0 - 44 U/L -   CBC Latest Ref  Rng & Units 02/17/2018  WBC 4.0 - 10.5 K/uL 9.8    Hemoglobin 12.0 - 15.0 g/dL 9.7(L)  Hematocrit 36.0 - 46.0 % 32.4(L)  Platelets 150 - 400 K/uL 440(H)   RADIOGRAPHIC STUDIES: I have personally reviewed the radiological images as listed and agreed with the findings in the report. Dg Chest 2 View  Result Date: 02/13/2018 CLINICAL DATA:  Gastric sleeve surgery performed 2 weeks ago, now weak and fatigued, syncopal episode EXAM: CHEST - 2 VIEW COMPARISON:  Chest x-ray of 12/27/2017 FINDINGS: No active infiltrate or effusion is seen. Mediastinal and hilar contours are unremarkable. The heart is within upper limits of normal. No acute bony abnormality is seen. IMPRESSION: No active cardiopulmonary disease. Electronically Signed   By: Ivar Drape M.D.   On: 02/13/2018 16:28   Ct Head Wo Contrast  Result Date: 02/13/2018 CLINICAL DATA:  Syncope.  Recent gastric bypass surgery. EXAM: CT HEAD WITHOUT CONTRAST TECHNIQUE: Contiguous axial images were obtained from the base of the skull through the vertex without intravenous contrast. COMPARISON:  None. FINDINGS: Brain: No evidence of acute infarction, hemorrhage, hydrocephalus, extra-axial collection or mass lesion/mass effect. Vascular: No hyperdense vessel or unexpected calcification. Skull: Normal. Negative for fracture or focal lesion. Sinuses/Orbits: No acute finding. Other: None. IMPRESSION: 1. Normal noncontrast head CT. Electronically Signed   By: Titus Dubin M.D.   On: 02/13/2018 16:38   US Renal  Result Date: 02/14/2018 CLINICAL DATA:  Acute renal failure. EXAM: RENAL / URINARY TRACT ULTRASOUND COMPLETE COMPARISON:  None. FINDINGS: Right Kidney: Renal measurements: 9.6 x 4.0 x 4.3 cm = volume: 85.7 mL . Echogenicity within normal limits. No mass or hydronephrosis visualized. Left Kidney: Renal measurements: 10.4 x 4.4 x 4.0 cm = volume: 96.9 mL. Normal renal cortical thickness and echogenicity. No hydronephrosis. Within the midpole of the left kidney there is a 1.8 x 1.9 x 1.9 cm hypoechoic  masslike structure. Bladder: Appears normal for degree of bladder distention. IMPRESSION: No hydronephrosis. Indeterminate hypoechoic masslike structure within the midpole of the left kidney. Recommend dedicated evaluation with pre and post contrast-enhanced CT when patient clinically able. Electronically Signed   By: Lovey Newcomer M.D.   On: 02/14/2018 15:34   Nm Pulmonary Perf And Vent  Result Date: 02/15/2018 CLINICAL DATA:  Inpatient.  NSTEMI.  Syncopal episode. EXAM: NUCLEAR MEDICINE VENTILATION - PERFUSION LUNG SCAN TECHNIQUE: Ventilation images were obtained in multiple projections using inhaled aerosol Tc-48mDTPA. Perfusion images were obtained in multiple projections after intravenous injection of Tc-970mAA. Lateral views could not be obtained due to patient body habitus. RADIOPHARMACEUTICALS:  33.6 mCi of Tc-9972mPA aerosol inhalation and 4.0 mCi Tc99m91m IV COMPARISON:  02/13/2018 chest radiograph. FINDINGS: Ventilation: Heterogeneous radiotracer uptake throughout the lungs on the ventilation images suggesting airways disease. Perfusion: There are several large segmental perfusion defects throughout both lungs, several of which are unmatched compared to the ventilation images and which have no correlate on the chest radiograph. IMPRESSION: High probability for pulmonary embolism (greater than 80%) per PIOPED II criteria. Electronically Signed   By: JasoIlona Sorrel.   On: 02/15/2018 18:05   Us VK[place]ous Img Lower Bilateral  Result Date: 02/15/2018 CLINICAL DATA:  Rule out DVT.  Concern for thromboembolic disease. EXAM: BILATERAL LOWER EXTREMITY VENOUS DOPPLER ULTRASOUND TECHNIQUE: Gray-scale sonography with graded compression, as well as color Doppler and duplex ultrasound were performed to evaluate the lower extremity deep venous systems from the level of the common femoral vein and including the common  femoral, femoral, profunda femoral, popliteal and calf veins including the posterior tibial,  peroneal and gastrocnemius veins when visible. The superficial great saphenous vein was also interrogated. Spectral Doppler was utilized to evaluate flow at rest and with distal augmentation maneuvers in the common femoral, femoral and popliteal veins. COMPARISON:  None. FINDINGS: RIGHT LOWER EXTREMITY Common Femoral Vein: Difficult to evaluate because the patient had recent heart catheterization and the right groin was covered with a bandage. However, the distal common femoral artery is compressible with color Doppler flow. Saphenofemoral Junction: Not visualized due to the bandage. Profunda Femoral Vein: No evidence of thrombus. Normal compressibility and flow on color Doppler imaging. Femoral Vein: No evidence of thrombus. Normal compressibility, respiratory phasicity and response to augmentation. Popliteal Vein: No evidence of thrombus. Normal compressibility, respiratory phasicity and response to augmentation. Calf Veins: No evidence of thrombus. Normal compressibility and flow on color Doppler imaging. Other Findings:  None. LEFT LOWER EXTREMITY Common Femoral Vein: No evidence of thrombus. Normal compressibility, respiratory phasicity and response to augmentation. Saphenofemoral Junction: No evidence of thrombus. Normal compressibility and flow on color Doppler imaging. Profunda Femoral Vein: No evidence of thrombus. Normal compressibility and flow on color Doppler imaging. Femoral Vein: No evidence of thrombus. Normal compressibility, respiratory phasicity and response to augmentation. Incidentally, there appears to be prominent valves along the proximal femoral vein but this vessel is totally compressible. Popliteal Vein: No evidence of thrombus. Normal compressibility, respiratory phasicity and response to augmentation. Calf Veins: No evidence of thrombus. Normal compressibility and flow on color Doppler imaging. Other Findings:  None. IMPRESSION: No evidence of deep venous thrombosis. Partial  visualization of the right common femoral vein due to the right groin bandage. Electronically Signed   By: Markus Daft M.D.   On: 02/15/2018 17:31     Assessment and plan Patient is a 53 y.o. female female with history of morbid obesity status post laparoscopic sleeve gastrectomy on 01/29/2018, diabetes, hypertension, presented to the emergency room for evaluation of syncope episodes.  Status post cardiac cath which showed increased pulmonary hypertension.  Normal coronary artery disease.  VQ scan showed high probability of PE.  1. Thrombocytosis ([place])   2. Acute pulmonary embolism, unspecified pulmonary embolism type, unspecified whether acute cor pulmonale present (HCC)     #Pulmonary embolism, possible provoked by recent laparoscopic sleeve gastrectomy.  Morbid obesity may also contribute. Tolerating Eliquis 5 mg twice daily well.  Duration of anticoagulation, recommend at least 6 months of anticoagulation. I will check hypercoagulable work-up including prothrombin and factor V Leiden mutation. After she finishes 6 months of anticoagulation, I recommend maintenance Eliquis 2.5 mg twice daily.  #Normocytic anemia, Reticulocyte panel showed decreased reticulocyte hemoglobin.  Indicating iron deficiency. Check iron, TIBC, ferritin.  #Thrombocytosis likely reactive to iron deficiency as well as NSTEMI and PE. Trend CBC.  We spent sufficient time to discuss many aspect of care, questions were answered to patient's satisfaction.  Orders Placed This Encounter  Procedures  . CBC with Differential/Platelet    Standing Status:   Future    Number of Occurrences:   1    Standing Expiration Date:   03/08/2019  . Iron and TIBC    Standing Status:   Future    Number of Occurrences:   1    Standing Expiration Date:   03/08/2019  . Ferritin    Standing Status:   Future    Number of Occurrences:   1    Standing Expiration Date:   03/08/2019  .  Prothrombin gene mutation    Standing Status:    Future    Number of Occurrences:   1    Standing Expiration Date:   03/08/2019  . Factor 5 leiden    Standing Status:   Future    Number of Occurrences:   1    Standing Expiration Date:   03/08/2019  . CBC with Differential/Platelet    Standing Status:   Future    Standing Expiration Date:   03/07/2019  . Comprehensive metabolic panel    Standing Status:   Future    Standing Expiration Date:   03/07/2019  . Retic Panel    Standing Status:   Future    Standing Expiration Date:   03/08/2019     RTC in 3 months.  Total face to face encounter time for this patient visit was 25 min. >50% of the time was  spent in counseling and coordination of care.    Earlie Server, MD, PhD Hematology Oncology [facility] Pager- [phone] 03/07/2018

## 2018-03-07 NOTE — Addendum Note (Signed)
Addended by: Earlie Server on: 03/07/2018 03:50 PM   Modules accepted: Orders

## 2018-03-07 NOTE — Progress Notes (Signed)
Patient here for initial visit. °

## 2018-03-08 ENCOUNTER — Telehealth: Payer: Self-pay | Admitting: *Deleted

## 2018-03-08 NOTE — Telephone Encounter (Signed)
*  NEW* IV venofer weekly x 3 per Almyra Free 03/07/18 scheduling message: Appts were scheduled as requested. I called patient a left her a detailed message making her aware of the dates and time of her scheduled appts. Also a letter will be mailed out as well.

## 2018-03-12 LAB — FACTOR 5 LEIDEN

## 2018-03-13 LAB — PROTHROMBIN GENE MUTATION

## 2018-03-14 ENCOUNTER — Inpatient Hospital Stay: Payer: BLUE CROSS/BLUE SHIELD

## 2018-03-14 VITALS — BP 147/86 | HR 86 | Resp 20

## 2018-03-14 DIAGNOSIS — D500 Iron deficiency anemia secondary to blood loss (chronic): Secondary | ICD-10-CM | POA: Diagnosis not present

## 2018-03-14 MED ORDER — SODIUM CHLORIDE 0.9 % IV SOLN
Freq: Once | INTRAVENOUS | Status: AC
  Administered 2018-03-14: 14:00:00 via INTRAVENOUS
  Filled 2018-03-14: qty 250

## 2018-03-14 MED ORDER — IRON SUCROSE 20 MG/ML IV SOLN
200.0000 mg | Freq: Once | INTRAVENOUS | Status: AC
  Administered 2018-03-14: 200 mg via INTRAVENOUS
  Filled 2018-03-14: qty 10

## 2018-03-21 ENCOUNTER — Inpatient Hospital Stay: Payer: BLUE CROSS/BLUE SHIELD | Attending: Oncology

## 2018-03-21 VITALS — BP 152/88 | HR 66 | Temp 98.8°F | Resp 18

## 2018-03-21 DIAGNOSIS — D500 Iron deficiency anemia secondary to blood loss (chronic): Secondary | ICD-10-CM | POA: Insufficient documentation

## 2018-03-21 MED ORDER — SODIUM CHLORIDE 0.9 % IV SOLN
Freq: Once | INTRAVENOUS | Status: AC
  Administered 2018-03-21: 15:00:00 via INTRAVENOUS
  Filled 2018-03-21: qty 250

## 2018-03-21 MED ORDER — IRON SUCROSE 20 MG/ML IV SOLN
200.0000 mg | Freq: Once | INTRAVENOUS | Status: AC
  Administered 2018-03-21: 200 mg via INTRAVENOUS
  Filled 2018-03-21: qty 10

## 2018-03-25 ENCOUNTER — Encounter: Payer: Self-pay | Admitting: Internal Medicine

## 2018-03-25 NOTE — Progress Notes (Signed)
Subjective:     Patient ID: Kristen [name] , female    DOB: 01-Jun-1965 , 53 y.o.   MRN: [id number]   Chief Complaint  Patient presents with  . Hospitalization Follow-up    HPI  She is here today for hospital f/u.  Patient is a 53 year old female who was admitted to [facility] on 12/31 initially with diagnosis of non-ST MI elevation.  Patient subsequently had a syncopal episode for which [facility] was called and had transient episode of chest compressions before patient woke up and started talking.  Pt had no evidence of cardiac arrest.  She was felt to have had a syncopal episode.  Cardiology was consulted, it was recommended that she continue to receive supportive care for possible NSTEMI with heparin/aspirin and B-blocker. Cardiac cath revealed Hypertrophic Cardiomyopathy with EF 70%, normal coronaries and elevated pulmonary pressures. Pt then had evaluation for possible PE which was positive. She had negative LE dopplers. She was then started on Eliquis. She has f/u scheduled with hematology. She was discharged on 02/19/2018 in stable condition. She states she is still SOB w/ exertion, but she is feeling better.         Past Medical History:  Diagnosis Date  . Arthritis   . Diabetes ([place])    Type 2  . Hypertension   . Iron deficiency anemia 03/07/2018  . URI (upper respiratory infection)    took antibiotic and prednisone oct 2019 resolved  . Uses contact lenses      Family History  Problem Relation Age of Onset  . Hypertension Mother   . Diabetes Mother   . Hypertension Father   . Diabetes Father   . COPD Father   . Stroke Father   . Congestive Heart Failure Father   . Diabetes Sister   . Hypertension Sister      Current Outpatient Medications:  .  albuterol (PROVENTIL HFA;VENTOLIN HFA) 108 (90 Base) MCG/ACT inhaler, Inhale 1-2 puffs into the lungs every 6 (six) hours as needed for wheezing or shortness of breath., Disp: 1 Inhaler, Rfl: 2 .  apixaban (ELIQUIS)  5 MG TABS tablet, 10 mg po bid for 4 days, then 5 mg po bid. (Patient taking differently: Take 5 mg by mouth 2 (two) times daily. 10 mg po bid for 4 days, then 5 mg po bid.), Disp: 60 tablet, Rfl: 1 .  aspirin 81 MG chewable tablet, Chew 1 tablet (81 mg total) by mouth 2 (two) times daily. (Patient taking differently: Chew 81 mg by mouth every Monday, Wednesday, and Friday. ), Disp: 35 tablet, Rfl: 0 .  blood glucose meter kit and supplies KIT, Dispense based on patient and insurance preference. Use up to four times daily as directed. (FOR ICD-9 250.00, 250.01)., Disp: 1 each, Rfl: 0 .  calcium carbonate (TUMS - DOSED IN MG ELEMENTAL CALCIUM) 500 MG chewable tablet, Chew 1 tablet by mouth 3 (three) times daily., Disp: , Rfl:  .  cetirizine (ZYRTEC) 10 MG tablet, Take 10 mg by mouth daily., Disp: , Rfl:  .  ferrous sulfate 325 (65 FE) MG tablet, Take 1 tablet (325 mg total) by mouth 2 (two) times daily with a meal., Disp: 60 tablet, Rfl: 0 .  FLUoxetine (PROZAC) 20 MG tablet, Take 1 tablet (20 mg total) by mouth daily., Disp: 90 tablet, Rfl: 1 .  Multiple Vitamins-Minerals (BARIATRIC MULTIVITAMINS/IRON PO), Take 1 tablet by mouth 2 (two) times daily., Disp: , Rfl:  .  ondansetron (ZOFRAN-ODT) 4 MG disintegrating  tablet, Take 1 tablet (4 mg total) by mouth every 6 (six) hours as needed for nausea or vomiting., Disp: 20 tablet, Rfl: 0 .  pantoprazole (PROTONIX) 40 MG tablet, Take 1 tablet (40 mg total) by mouth daily., Disp: 90 tablet, Rfl: 0 .  acetaminophen (TYLENOL) 500 MG tablet, Take 500 mg by mouth every 6 (six) hours as needed., Disp: , Rfl:    Allergies  Allergen Reactions  . Amoxicillin Hives    Has patient had a PCN reaction causing immediate rash, facial/tongue/throat swelling, SOB or lightheadedness with hypotension: No Has patient had a PCN reaction causing severe rash involving mucus membranes or skin necrosis: Yes Has patient had a PCN reaction that required hospitalization No Has  patient had a PCN reaction occurring within the last 10 years: No If all of the above answers are "NO", then may proceed with Cephalosporin use.   . Fish Allergy Hives     Review of Systems  Constitutional: Negative.   Respiratory: Positive for shortness of breath.   Cardiovascular: Negative.   Gastrointestinal: Negative.   Neurological: Negative.   Psychiatric/Behavioral: Negative.      Today's Vitals   02/27/18 1428  BP: 110/86  Pulse: 95  Temp: 98.2 F (36.8 C)  TempSrc: Oral  Weight: (!) 317 lb 9.6 oz (144.1 kg)  Height: '5\' 4"'  (1.626 m)   Body mass index is 54.52 kg/m.   Objective:  Physical Exam Vitals signs and nursing note reviewed.  Constitutional:      Appearance: Normal appearance. She is obese.  HENT:     Head: Normocephalic and atraumatic.  Cardiovascular:     Rate and Rhythm: Normal rate and regular rhythm.     Heart sounds: Normal heart sounds.  Pulmonary:     Effort: Pulmonary effort is normal.     Breath sounds: Normal breath sounds.  Skin:    General: Skin is warm.  Neurological:     General: No focal deficit present.     Mental Status: She is alert.  Psychiatric:        Mood and Affect: Mood normal.         Assessment And Plan:     1. Pulmonary embolism, unspecified chronicity, unspecified pulmonary embolism type, unspecified whether acute cor pulmonale present (HCC)  TCM PERFORMED. A MEMBER OF THE CLINICAL TEAM SPOKE WITH THE PATIENT UPON DISCHARGE. DISCHARGE SUMMARY WAS REVIEWED IN FULL DETAIL DURING THE VISIT. MEDS RECONCILED AND COMPARED TO DISCHARGE MEDS. MEDICATION LIST WAS UPDATED AND REVIEWED WITH THE PATIENT. GREATER THAN 50% FACE TO FACE TIME WAS SPENT IN COUNSELING AND COORDINATION OF CARE. ALL QUESTIONS WERE ANSWERED TO THE SATISFACTION OF THE PATIENT.   Patient was on heparin drip, changed to Eliquis at 10 mg twice daily for the first 7 days followed by 5 mg twice daily for treatment of acute PE.   She needs anticoagulation  with Eliquis for 6 months, then may need Eliquis 2.5 mg twice daily, hypercoagulation work-up as outpatient per Dr. Tasia Catchings.   2. Renal insufficiency  I will check repeat renal panel today. She is encouraged to stay well hydrated. I will continue to hold antihypertensives for now.   - BMP8+EGFR  3. Class 3 severe obesity due to excess calories with serious comorbidity and body mass index (BMI) of 50.0 to 59.9 in adult [facility])  She is s/p gastric sleeve surgery. Importance of achieving optimal weight to decrease risk of cardiovascular disease and cancers was discussed with the patient in full detail.  She is encouraged to start slowly - start with 10 minutes twice daily at least three to four days per week and to gradually build to 30 minutes five days weekly. She was given tips to incorporate more activity into her daily routine - take stairs when possible, park farther away from her job, grocery stores, etc. .     Maximino Greenland, MD

## 2018-03-26 ENCOUNTER — Telehealth: Payer: Self-pay | Admitting: *Deleted

## 2018-03-26 NOTE — Telephone Encounter (Signed)
Requesting a note to return to work effective immediately. Please call her when it is ready

## 2018-03-27 ENCOUNTER — Encounter: Payer: Self-pay | Admitting: *Deleted

## 2018-03-27 NOTE — Telephone Encounter (Signed)
Ok to give. Thanks

## 2018-03-27 NOTE — Telephone Encounter (Signed)
We did not provide any work restrictions for receiving iron infusions. We cannot provide a letter to return to work when we did not take her out of work.  We can provide a letter for the days she had appointments here in the clinic.  I will contact pt to inform her.

## 2018-03-28 ENCOUNTER — Encounter: Payer: Self-pay | Admitting: *Deleted

## 2018-03-28 ENCOUNTER — Inpatient Hospital Stay: Payer: BLUE CROSS/BLUE SHIELD

## 2018-03-28 VITALS — BP 148/92 | HR 102 | Temp 97.1°F | Resp 18

## 2018-03-28 DIAGNOSIS — D500 Iron deficiency anemia secondary to blood loss (chronic): Secondary | ICD-10-CM | POA: Diagnosis not present

## 2018-03-28 MED ORDER — IRON SUCROSE 20 MG/ML IV SOLN
200.0000 mg | Freq: Once | INTRAVENOUS | Status: AC
  Administered 2018-03-28: 200 mg via INTRAVENOUS
  Filled 2018-03-28: qty 10

## 2018-03-28 MED ORDER — SODIUM CHLORIDE 0.9 % IV SOLN
Freq: Once | INTRAVENOUS | Status: AC
  Administered 2018-03-28: 15:00:00 via INTRAVENOUS
  Filled 2018-03-28: qty 250

## 2018-03-28 NOTE — Telephone Encounter (Signed)
Spoke with patient, she states that she was seen in ED for PE, followed up here with Dr Tasia Catchings wasn't working due to PE.  Spoke with Dr Tasia Catchings, okay to proceed with work note.  Note completed and given to infusion nurse to give patient when she comes today for iron infusion.

## 2018-04-03 ENCOUNTER — Encounter: Payer: BLUE CROSS/BLUE SHIELD | Attending: General Surgery | Admitting: Dietician

## 2018-04-03 ENCOUNTER — Encounter: Payer: Self-pay | Admitting: Dietician

## 2018-04-03 VITALS — Ht 65.0 in | Wt 289.7 lb

## 2018-04-03 DIAGNOSIS — Z6841 Body Mass Index (BMI) 40.0 and over, adult: Secondary | ICD-10-CM | POA: Diagnosis not present

## 2018-04-03 DIAGNOSIS — Z713 Dietary counseling and surveillance: Secondary | ICD-10-CM | POA: Insufficient documentation

## 2018-04-03 NOTE — Patient Instructions (Signed)
   Continue to progress with adding low-carb veggies along with healthy proteins with meals and snacks. When trying salad, avoid iceberg lettuce, kale (at first, harder to chew, more fibrous) and opt for romaine or leaf or bibb lettuce; peeled seedless cucumber, tomatoes.   Gradually add light physical activity as cleared by MD and tolerated.   Consider adding unflavored protein powder into foods such as yogurt, hummus, soups, as another alternative to meet daily goals.

## 2018-04-03 NOTE — Progress Notes (Signed)
Follow-up visit:  2 Months Post-Operative sleeve gastrectomy Surgery  Medical Nutrition Therapy:  Appt start time: 0900 end time:  0945.  Primary concerns today: Post-operative Bariatric Surgery Nutrition Management.  Preferred Learning Style:   Auditory  Visual  Hands on  Learning Readiness:   Change in progress  Weight: 289.7lbs Height: 5'5" Weight at previous NDES visit: 297.3lbs 02/12/18 Total weight loss of 26.8lbs since prior visit on 12/01/17.      Dietary recall: Breakfast: yogurt light and fit; scrambled eggs; cheese Snack: cheese stick; pepperoni slices; peanut butter Lunch: [place]; beans Snack: yogurt; hummus; applesauce  Dinner: meat + soft veg brocc, zucchini, squash Snack: drinks 8oz water before bedtime to avoid waking up with empty stomach (no hunger sx, just empty)  Fluid intake: mostly water, tea with sweetener, decaf coffee with sweetener; diet lemonade Estimated total protein intake: 60g, monitors intake closely  Medications: acetaminophen prn, albuterol inhaler prn, apixaban, cetirizine, FLUoxetine, olmesartan, pantoprazole Supplementation: bariatric multivitamin transdermal patch; includes calcium and iron and B12  Using straws: no Drinking while eating: no Hair loss: has alopecia Carbonated beverages: no N/V/D/C: no Dumping syndrome: no   Recent physical activity:  Contraindicated at this time due to recent PE; patient feels sheis able to move more easily  Progress Towards Goal(s):  In progress.  Patient experienced a pulmonary embolism on 02/12/18, which triggered an MI, and was hospitalized x1 week.   Patient reports losing inches/ decreasing size. Body composition report shows stable muscle weight with loss of body fat.   She had some pain after trying raw carrot, but is craving salad.   She was given applesauce during her hospital stay which worked well for her, so she has continued to eat applesauce.   She is no longer drinking  protein shakes as she has developed an aversion.    Handouts given during visit include:  Phase V and VI bariatric diet handouts   Nutritional Diagnosis:  [place]-3.3 Overweight/obesity As related to history of excess calories and inactivity.  As evidenced by patient with current BMI of 48.21, following dietary guidelines for ongoing weight loss after bariatric surgery.    Intervention:    Reviewed patient's progress since previous visit.  She is following dietary guidelines closely, actively monitoring her protein and fluid intake and meeting daily goals.   Discussed dietary advancement, including importance of ongoing portion control, consumption of low-carb vegetables, other protein options suitable for work environment.   She is comfortable with slow dietary progression, and will continue to increase low-carb vegetables while maintaining protein intake; will eventually add other foods such as fruits and peas, sweet potatoes. She will limit/ avoid white potatoes and corn.   Teaching Method Utilized:  Visual Auditory Hands on  Barriers to learning/adherence to lifestyle change: none  Demonstrated degree of understanding via:  Teach Back   Monitoring/Evaluation:  Dietary intake, exercise, and body weight. Follow up in 4 months for 6 month post-op visit, or sooner if needed.

## 2018-04-09 ENCOUNTER — Ambulatory Visit
Admission: RE | Admit: 2018-04-09 | Discharge: 2018-04-09 | Disposition: A | Discharge status: Home / Self Care | Payer: BLUE CROSS/BLUE SHIELD | Source: Ambulatory Visit | Attending: Nephrology | Admitting: Nephrology

## 2018-04-09 DIAGNOSIS — N2889 Other specified disorders of kidney and ureter: Secondary | ICD-10-CM | POA: Diagnosis present

## 2018-04-09 LAB — POCT I-STAT CREATININE: Creatinine, Ser: 1.1 mg/dL — ABNORMAL HIGH (ref 0.44–1.00)

## 2018-04-09 MED ORDER — IOHEXOL 300 MG/ML  SOLN
100.0000 mL | Freq: Once | INTRAMUSCULAR | Status: AC | PRN
  Administered 2018-04-09: 100 mL via INTRAVENOUS

## 2018-04-15 ENCOUNTER — Other Ambulatory Visit: Payer: Self-pay | Admitting: Internal Medicine

## 2018-04-15 MED ORDER — APIXABAN 5 MG PO TABS: 5.0000 mg | ORAL_TABLET | Freq: Two times a day (BID) | ORAL | 1 refills | Status: DC

## 2018-04-15 NOTE — Progress Notes (Signed)
Called in script for eliquis.

## 2018-04-16 ENCOUNTER — Other Ambulatory Visit: Payer: Self-pay | Admitting: *Deleted

## 2018-04-16 NOTE — Progress Notes (Signed)
Thank you :)

## 2018-04-20 ENCOUNTER — Ambulatory Visit (INDEPENDENT_AMBULATORY_CARE_PROVIDER_SITE_OTHER): Payer: BLUE CROSS/BLUE SHIELD | Admitting: Nurse Practitioner

## 2018-04-20 ENCOUNTER — Encounter: Payer: Self-pay | Admitting: Nurse Practitioner

## 2018-04-20 VITALS — BP 150/90 | HR 95 | Ht 65.0 in | Wt 281.0 lb

## 2018-04-20 DIAGNOSIS — I2699 Other pulmonary embolism without acute cor pulmonale: Secondary | ICD-10-CM

## 2018-04-20 DIAGNOSIS — I10 Essential (primary) hypertension: Secondary | ICD-10-CM

## 2018-04-20 DIAGNOSIS — E119 Type 2 diabetes mellitus without complications: Secondary | ICD-10-CM | POA: Diagnosis not present

## 2018-04-20 NOTE — Progress Notes (Signed)
Subjective:     Patient ID: Kristen [name] , female    DOB: Jan 11, 1966 , 53 y.o.   MRN: [id number]   Chief Complaint  Patient presents with  . Hypertension    HPI  Nephrologist took her off HCTZ, olmesartan 49m continues April 2.  BP at home diastolic 866-440  Systolic up to 1347'Q  She has appt in May with Cardiologist - his intention to continue   She is back at work for the last 3 weeks.    She is on stage 3 bariatric diet - soft meats, and vegetables. When she has the hiccups she knows she is full.  60g  She has had 3 doses of IV iron.  Not exercising  Hypertension  This is a chronic problem. The current episode started more than 1 year ago. The problem has been gradually worsening since onset. The problem is uncontrolled. Pertinent negatives include no anxiety, chest pain, headaches or peripheral edema.  Diabetes  Pertinent negatives for hypoglycemia include no dizziness or headaches. Pertinent negatives for diabetes include no chest pain, no polydipsia, no polyphagia and no polyuria. Her overall blood glucose range is 140-180 mg/dl.     Past Medical History:  Diagnosis Date  . Arthritis   . Diabetes ([place]    Type 2  . Hypertension   . Iron deficiency anemia 03/07/2018  . URI (upper respiratory infection)    took antibiotic and prednisone oct 2019 resolved  . Uses contact lenses      Family History  Problem Relation Age of Onset  . Hypertension Mother   . Diabetes Mother   . Hypertension Father   . Diabetes Father   . COPD Father   . Stroke Father   . Congestive Heart Failure Father   . Diabetes Sister   . Hypertension Sister      Current Outpatient Medications:  .  acetaminophen (TYLENOL) 500 MG tablet, Take 500 mg by mouth every 6 (six) hours as needed., Disp: , Rfl:  .  albuterol (PROVENTIL HFA;VENTOLIN HFA) 108 (90 Base) MCG/ACT inhaler, Inhale 1-2 puffs into the lungs every 6 (six) hours as needed for wheezing or shortness of breath., Disp: 1  Inhaler, Rfl: 2 .  apixaban (ELIQUIS) 5 MG TABS tablet, Take 1 tablet (5 mg total) by mouth 2 (two) times daily., Disp: 60 tablet, Rfl: 1 .  blood glucose meter kit and supplies KIT, Dispense based on patient and insurance preference. Use up to four times daily as directed. (FOR ICD-9 250.00, 250.01)., Disp: 1 each, Rfl: 0 .  cetirizine (ZYRTEC) 10 MG tablet, Take 10 mg by mouth daily., Disp: , Rfl:  .  FLUoxetine (PROZAC) 20 MG tablet, Take 1 tablet (20 mg total) by mouth daily., Disp: 90 tablet, Rfl: 1 .  Multiple Vitamins-Minerals (BARIATRIC MULTIVITAMINS/IRON PO), Take 1 tablet by mouth 2 (two) times daily. Patient using bariatric transdermal patch with calcium B12 and iron, Disp: , Rfl:  .  NONFORMULARY OR COMPOUNDED ITEM, Iron patch apply every 8 hours, Disp: , Rfl:  .  olmesartan (BENICAR) 20 MG tablet, Take 20 mg by mouth daily. Pt is taking 468m Disp: , Rfl:  .  pantoprazole (PROTONIX) 40 MG tablet, Take 1 tablet (40 mg total) by mouth daily., Disp: 90 tablet, Rfl: 0   Allergies  Allergen Reactions  . Amoxicillin Hives    Has patient had a PCN reaction causing immediate rash, facial/tongue/throat swelling, SOB or lightheadedness with hypotension: No Has patient had a PCN reaction causing  severe rash involving mucus membranes or skin necrosis: Yes Has patient had a PCN reaction that required hospitalization No Has patient had a PCN reaction occurring within the last 10 years: No If all of the above answers are "NO", then may proceed with Cephalosporin use.   . Fish Allergy Hives     Review of Systems  Respiratory: Negative.  Negative for cough.   Cardiovascular: Negative for chest pain and leg swelling.  Gastrointestinal: Negative.   Endocrine: Negative for polydipsia, polyphagia and polyuria.  Musculoskeletal: Negative.   Neurological: Negative for dizziness and headaches.     Today's Vitals   04/20/18 0950  BP: (!) 150/90  Pulse: 95  SpO2: 96%  Weight: 281 lb (127.5 kg)   Height: 5' 5" (1.651 m)   Body mass index is 46.76 kg/m.   Objective:  Physical Exam Vitals signs reviewed.  Constitutional:      Appearance: Normal appearance. She is well-developed.  HENT:     Head: Normocephalic and atraumatic.  Eyes:     Pupils: Pupils are equal, round, and reactive to light.  Neck:     Musculoskeletal: Normal range of motion and neck supple.  Cardiovascular:     Rate and Rhythm: Normal rate and regular rhythm.     Pulses: Normal pulses.     Heart sounds: Normal heart sounds. No murmur.  Pulmonary:     Effort: Pulmonary effort is normal.     Breath sounds: Normal breath sounds.  Chest:     Chest wall: No tenderness.  Musculoskeletal: Normal range of motion.  Skin:    General: Skin is warm and dry.     Capillary Refill: Capillary refill takes less than 2 seconds.  Neurological:     General: No focal deficit present.     Mental Status: She is alert and oriented to person, place, and time.     Cranial Nerves: No cranial nerve deficit.  Psychiatric:        Mood and Affect: Mood normal.         Assessment And Plan:     1. Essential hypertension  She has had changes to her HCTZ by the nephrologist  Blood pressure is improved however continues to be elevated, I will not make any changes to her medications due to being seen by Nephrology and Cardiology  2. Type 2 diabetes mellitus without complication, without long-term current use of insulin (HCC)  Chronic, stable  Continue with current medications  Encouraged to limit intake of sugary foods and drinks  Encouraged to increase physical activity to 150 minutes per week when she is cleared - Hemoglobin A1c  3. Pulmonary embolism, unspecified chronicity, unspecified pulmonary embolism type, unspecified whether acute cor pulmonale present ([place])  Continue follow up with cardiology  Continue with current medications   [name], FNP

## 2018-04-21 LAB — HEMOGLOBIN A1C
Est. average glucose Bld gHb Est-mCnc: 143 mg/dL
Hgb A1c MFr Bld: 6.6 % — ABNORMAL HIGH (ref 4.8–5.6)

## 2018-05-13 ENCOUNTER — Encounter: Payer: Self-pay | Admitting: Nurse Practitioner

## 2018-05-16 ENCOUNTER — Other Ambulatory Visit: Payer: Self-pay | Admitting: Internal Medicine

## 2018-06-05 ENCOUNTER — Other Ambulatory Visit: Payer: Self-pay

## 2018-06-06 ENCOUNTER — Other Ambulatory Visit: Payer: Self-pay

## 2018-06-06 ENCOUNTER — Inpatient Hospital Stay: Payer: BLUE CROSS/BLUE SHIELD | Attending: Oncology

## 2018-06-06 DIAGNOSIS — I2699 Other pulmonary embolism without acute cor pulmonale: Secondary | ICD-10-CM | POA: Diagnosis present

## 2018-06-06 LAB — CBC WITH DIFFERENTIAL/PLATELET
Abs Immature Granulocytes: 0.01 10*3/uL (ref 0.00–0.07)
Basophils Absolute: 0 10*3/uL (ref 0.0–0.1)
Basophils Relative: 0 %
Eosinophils Absolute: 0.1 10*3/uL (ref 0.0–0.5)
Eosinophils Relative: 1 %
HCT: 39.8 % (ref 36.0–46.0)
Hemoglobin: 13 g/dL (ref 12.0–15.0)
Immature Granulocytes: 0 %
Lymphocytes Relative: 47 %
Lymphs Abs: 2.5 10*3/uL (ref 0.7–4.0)
MCH: 27.5 pg (ref 26.0–34.0)
MCHC: 32.7 g/dL (ref 30.0–36.0)
MCV: 84.3 fL (ref 80.0–100.0)
Monocytes Absolute: 0.3 10*3/uL (ref 0.1–1.0)
Monocytes Relative: 6 %
Neutro Abs: 2.5 10*3/uL (ref 1.7–7.7)
Neutrophils Relative %: 46 %
Platelets: 294 10*3/uL (ref 150–400)
RBC: 4.72 MIL/uL (ref 3.87–5.11)
RDW: 16.1 % — ABNORMAL HIGH (ref 11.5–15.5)
WBC: 5.4 10*3/uL (ref 4.0–10.5)
nRBC: 0 % (ref 0.0–0.2)

## 2018-06-06 LAB — RETIC PANEL
Immature Retic Fract: 5.4 % (ref 2.3–15.9)
RBC.: 4.72 MIL/uL (ref 3.87–5.11)
Retic Count, Absolute: 47.2 10*3/uL (ref 19.0–186.0)
Retic Ct Pct: 1 % (ref 0.4–3.1)
Reticulocyte Hemoglobin: 33.3 pg (ref >27.9)

## 2018-06-06 LAB — COMPREHENSIVE METABOLIC PANEL
ALT: 12 U/L (ref 0–44)
AST: 21 U/L (ref 15–41)
Albumin: 3.9 g/dL (ref 3.5–5.0)
Alkaline Phosphatase: 80 U/L (ref 38–126)
Anion gap: 6 (ref 5–15)
BUN: 18 mg/dL (ref 6–20)
CO2: 26 mmol/L (ref 22–32)
Calcium: 8.9 mg/dL (ref 8.9–10.3)
Chloride: 108 mmol/L (ref 98–111)
Creatinine, Ser: 1.01 mg/dL — ABNORMAL HIGH (ref 0.44–1.00)
GFR calc Af Amer: >60 mL/min (ref >60)
GFR calc non Af Amer: >60 mL/min (ref >60)
Glucose, Bld: 125 mg/dL — ABNORMAL HIGH (ref 70–99)
Potassium: 4 mmol/L (ref 3.5–5.1)
Sodium: 140 mmol/L (ref 135–145)
Total Bilirubin: 0.6 mg/dL (ref 0.3–1.2)
Total Protein: 8 g/dL (ref 6.5–8.1)

## 2018-06-07 ENCOUNTER — Encounter: Payer: Self-pay | Admitting: Oncology

## 2018-06-07 ENCOUNTER — Telehealth: Payer: Self-pay | Admitting: *Deleted

## 2018-06-07 ENCOUNTER — Inpatient Hospital Stay (HOSPITAL_BASED_OUTPATIENT_CLINIC_OR_DEPARTMENT_OTHER): Payer: BLUE CROSS/BLUE SHIELD | Admitting: Oncology

## 2018-06-07 ENCOUNTER — Other Ambulatory Visit: Payer: BLUE CROSS/BLUE SHIELD

## 2018-06-07 DIAGNOSIS — D500 Iron deficiency anemia secondary to blood loss (chronic): Secondary | ICD-10-CM | POA: Diagnosis not present

## 2018-06-07 DIAGNOSIS — I10 Essential (primary) hypertension: Secondary | ICD-10-CM

## 2018-06-07 DIAGNOSIS — E119 Type 2 diabetes mellitus without complications: Secondary | ICD-10-CM | POA: Diagnosis not present

## 2018-06-07 DIAGNOSIS — Z86711 Personal history of pulmonary embolism: Secondary | ICD-10-CM | POA: Diagnosis not present

## 2018-06-07 DIAGNOSIS — Z7901 Long term (current) use of anticoagulants: Secondary | ICD-10-CM

## 2018-06-07 DIAGNOSIS — Z79899 Other long term (current) drug therapy: Secondary | ICD-10-CM

## 2018-06-07 NOTE — Telephone Encounter (Signed)
call attempt to initiate virtual visit. no answer. left vm requesting call back. Dr. Tasia Catchings and Benjamine Mola, RN made aware.

## 2018-06-07 NOTE — Progress Notes (Signed)
HEMATOLOGY-ONCOLOGY TeleHEALTH VISIT PROGRESS NOTE  I connected with Kristen [name] on 06/07/18 at 10:45 AM EDT by video enabled telemedicine visit and verified that I am speaking with the correct person using two identifiers. I discussed the limitations, risks, security and privacy concerns of performing an evaluation and management service by telemedicine and the availability of in-person appointments. I also discussed with the patient that there may be a patient responsible charge related to this service. The patient expressed understanding and agreed to proceed.   Other persons participating in the visit and their role in the encounter:  Geraldine Solar, [place], check in patient   Janeann Merl, RN, check in patient.   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   INTERVAL HISTORY Kristen [name] is a 53 y.o. female who has above history reviewed by me today presents for follow up visit for management of iron deficiency anemia chronic anticoagulation for history of acute PE. Problems and complaints are listed below:  Patient was last seen by me on 03/07/2018.  Received IV Venofer weekly x 3 Patient reports feeling significantly better IV iron infusion.  No new complaints today.  She takes Eliquis for history of acute PE.  Reports tolerating well.  Denies any bleeding events.  Review of Systems  Constitutional: Negative for appetite change, chills, fatigue and fever.  HENT:   Negative for hearing loss and voice change.   Eyes: Negative for eye problems.  Respiratory: Negative for chest tightness and cough.   Cardiovascular: Negative for chest pain.  Gastrointestinal: Negative for abdominal distention, abdominal pain and blood in stool.  Endocrine: Negative for hot flashes.  Genitourinary: Negative for difficulty urinating and frequency.    Musculoskeletal: Negative for arthralgias.  Skin: Negative for itching and rash.  Neurological: Negative for extremity weakness.  Hematological: Negative for adenopathy.  Psychiatric/Behavioral: Negative for confusion.    Past Medical History:  Diagnosis Date  . Arthritis   . Diabetes ([place])    Type 2  . Hypertension   . Iron deficiency anemia 03/07/2018  . URI (upper respiratory infection)    took antibiotic and prednisone oct 2019 resolved  . Uses contact lenses    Past Surgical History:  Procedure Laterality Date  . COLONOSCOPY WITH PROPOFOL N/A 03/18/2016   Procedure: COLONOSCOPY WITH PROPOFOL;  Surgeon: Carol Ada, MD;  [facility];  Service: Endoscopy;  Laterality: N/A;  . LAPAROSCOPIC GASTRIC SLEEVE RESECTION N/A 01/29/2018   Procedure: LAPAROSCOPIC GASTRIC SLEEVE RESECTION WITH UPPER ENDO AND ERAS PATHWAY;  Surgeon: Kinsinger, Arta Bruce, MD;  Location: [facility];  Service: General;  Laterality: N/A;  . RIGHT/LEFT HEART CATH AND CORONARY ANGIOGRAPHY N/A 02/15/2018   Procedure: RIGHT/LEFT HEART CATH AND CORONARY ANGIOGRAPHY;  Surgeon: Corey Skains, MD;  Location: [facility];  Service: Cardiovascular;  Laterality: N/A;  . TOTAL HIP ARTHROPLASTY Right 08/04/2017   Procedure: RIGHT TOTAL HIP ARTHROPLASTY ANTERIOR APPROACH;  Surgeon: Mcarthur Rossetti, MD;  [facility]: [facility];  Service: Orthopedics;  Laterality: Right;    Family History  Problem Relation Age of Onset  . Hypertension Mother   . Diabetes Mother   . Hypertension Father   . Diabetes Father   . COPD Father   . Stroke Father   . Congestive Heart Failure Father   . Diabetes Sister   . Hypertension Sister     Social History   Socioeconomic  History  . Marital status: Married    Spouse name: Not on file  . Number of children: Not on file  . Years of education: Not on file  . Highest education level: Not on file  Occupational History  . Not on file  Social Needs  . Financial resource  strain: Not on file  . Food insecurity:    Worry: Not on file    Inability: Not on file  . Transportation needs:    Medical: Not on file    Non-medical: Not on file  Tobacco Use  . Smoking status: Never Smoker  . Smokeless tobacco: Never Used  Substance and Sexual Activity  . Alcohol use: Not Currently    Comment: OCCASIONAL   . Drug use: No  . Sexual activity: Not on file  Lifestyle  . Physical activity:    Days per week: Not on file    Minutes per session: Not on file  . Stress: Not on file  Relationships  . Social connections:    Talks on phone: Not on file    Gets together: Not on file    Attends religious service: Not on file    Active member of club or organization: Not on file    Attends meetings of clubs or organizations: Not on file    Relationship status: Not on file  . Intimate partner violence:    Fear of current or ex partner: Not on file    Emotionally abused: Not on file    Physically abused: Not on file    Forced sexual activity: Not on file  Other Topics Concern  . Not on file  Social History Narrative  . Not on file    Current Outpatient Medications on File Prior to Visit  Medication Sig Dispense Refill  . acetaminophen (TYLENOL) 500 MG tablet Take 500 mg by mouth every 6 (six) hours as needed.    . blood glucose meter kit and supplies KIT Dispense based on patient and insurance preference. Use up to four times daily as directed. (FOR ICD-9 250.00, 250.01). 1 each 0  . cetirizine (ZYRTEC) 10 MG tablet Take 10 mg by mouth daily.    [name] docusate sodium (COLACE) 100 MG capsule Take 100 mg by mouth 2 (two) times daily.    [name] ELIQUIS 5 MG TABS tablet TAKE 1 TABLET BY MOUTH TWICE A DAY 60 tablet 1  . FLUoxetine (PROZAC) 20 MG tablet Take 1 tablet (20 mg total) by mouth daily. 90 tablet 1  . Multiple Vitamins-Minerals (BARIATRIC MULTIVITAMINS/IRON PO) Take 1 tablet by mouth 2 (two) times daily. Patient using bariatric transdermal patch with calcium B12 and iron     . NONFORMULARY OR COMPOUNDED ITEM Iron patch apply every 8 hours    . olmesartan (BENICAR) 20 MG tablet Take 20 mg by mouth daily. Pt is taking 44m    . albuterol (PROVENTIL HFA;VENTOLIN HFA) 108 (90 Base) MCG/ACT inhaler Inhale 1-2 puffs into the lungs every 6 (six) hours as needed for wheezing or shortness of breath. (Patient not taking: Reported on 06/07/2018) 1 Inhaler 2   No current facility-administered medications on file prior to visit.     Allergies  Allergen Reactions  . Amoxicillin Hives    Has patient had a PCN reaction causing immediate rash, facial/tongue/throat swelling, SOB or lightheadedness with hypotension: No Has patient had a PCN reaction causing severe rash involving mucus membranes or skin necrosis: Yes Has patient had a PCN reaction that required hospitalization No Has  patient had a PCN reaction occurring within the last 10 years: No If all of the above answers are "NO", then may proceed with Cephalosporin use.   . Fish Allergy Hives       Observations/Objective: Today's Vitals   06/07/18 1000  PainSc: 0-No pain   There is no height or weight on file to calculate BMI.  Physical Exam  Constitutional: She is oriented to person, place, and time. No distress.  HENT:  Head: Normocephalic and atraumatic.  Neck: Normal range of motion.  Pulmonary/Chest: Effort normal. No respiratory distress.  Neurological: She is alert and oriented to person, place, and time.  Psychiatric: Affect normal.    CBC    Component Value Date/Time   WBC 5.4 06/06/2018 1051   RBC 4.72 06/06/2018 1051   RBC 4.72 06/06/2018 1051   HGB 13.0 06/06/2018 1051   HGB 10.9 (L) 01/19/2018 1201   HCT 39.8 06/06/2018 1051   HCT 33.1 (L) 01/19/2018 1201   PLT 294 06/06/2018 1051   PLT 602 (H) 01/19/2018 1201   MCV 84.3 06/06/2018 1051   MCV 76 (L) 01/19/2018 1201   MCH 27.5 06/06/2018 1051   MCHC 32.7 06/06/2018 1051   RDW 16.1 (H) 06/06/2018 1051   RDW 16.4 (H) 01/19/2018 1201    LYMPHSABS 2.5 06/06/2018 1051   MONOABS 0.3 06/06/2018 1051   EOSABS 0.1 06/06/2018 1051   BASOSABS 0.0 06/06/2018 1051    CMP     Component Value Date/Time   NA 140 06/06/2018 1051   NA 142 02/27/2018 1509   K 4.0 06/06/2018 1051   CL 108 06/06/2018 1051   CO2 26 06/06/2018 1051   GLUCOSE 125 (H) 06/06/2018 1051   BUN 18 06/06/2018 1051   BUN 18 02/27/2018 1509   CREATININE 1.01 (H) 06/06/2018 1051   CALCIUM 8.9 06/06/2018 1051   PROT 8.0 06/06/2018 1051   ALBUMIN 3.9 06/06/2018 1051   AST 21 06/06/2018 1051   ALT 12 06/06/2018 1051   ALKPHOS 80 06/06/2018 1051   BILITOT 0.6 06/06/2018 1051   GFRNONAA >60 06/06/2018 1051   GFRAA >60 06/06/2018 1051    RADIOGRAPHIC STUDIES: I have personally reviewed the radiological images as listed and agreed with the findings in the report. CT abdomen pelvis w and wo contrast.  1. No renal mass. Lobular contour of the LEFT and RIGHT kidney consistent benign anatomic variation. 2. Normal ureters and bladder. 3. Fine lobular contour of the liver could indicate early cirrhosis. 4. Cholelithiasis without evidence cholecystitis.   Assessment and Plan: 1. Iron deficiency anemia due to chronic blood loss   2. History of pulmonary embolism     Labs are reviewed and discussed with patient.  Hemoglobin improved and normalized. Retic hemoglobin normal. Hold additional IV iron.  Repeat iron panel in 3 months to ensure stability.   Tolerating anticoagulation with Eliquis. Continue Eliquis 110m BID and finish total of 6 months of anticoagulation-- end of June-. After that plan to decrease to 2.553mBID.   History of gastric sleeve. Will check vitamin B12 and folate. Follow Up Instructions:  3 months lab MD.   I discussed the assessment and treatment plan with the patient. The patient was provided an opportunity to ask questions and all were answered. The patient agreed with the plan and demonstrated an understanding of the instructions.  The  patient was advised to call back or seek an in-person evaluation if the symptoms worsen or if the condition fails to improve as anticipated.  I provided 15 minutes of face-to-face video visit time during this encounter, and > 50% was spent counseling as documented under my assessment & plan.  Earlie Server, MD 06/07/2018 8:59 PM

## 2018-06-21 ENCOUNTER — Telehealth: Payer: Self-pay

## 2018-06-21 NOTE — Telephone Encounter (Signed)
Called to inform pt that appt had been moved and encouraged her to call the office if this time didn't work for her

## 2018-07-15 ENCOUNTER — Other Ambulatory Visit: Payer: Self-pay | Admitting: Oncology

## 2018-07-17 ENCOUNTER — Ambulatory Visit (INDEPENDENT_AMBULATORY_CARE_PROVIDER_SITE_OTHER): Payer: BLUE CROSS/BLUE SHIELD | Admitting: Nurse Practitioner

## 2018-07-17 ENCOUNTER — Other Ambulatory Visit: Payer: Self-pay

## 2018-07-17 ENCOUNTER — Encounter: Payer: Self-pay | Admitting: Nurse Practitioner

## 2018-07-17 ENCOUNTER — Other Ambulatory Visit: Payer: Self-pay | Admitting: Nurse Practitioner

## 2018-07-17 VITALS — BP 132/84 | HR 86 | Temp 98.0°F | Ht 63.8 in | Wt 278.6 lb

## 2018-07-17 DIAGNOSIS — Z0000 Encounter for general adult medical examination without abnormal findings: Secondary | ICD-10-CM | POA: Diagnosis not present

## 2018-07-17 DIAGNOSIS — Z6841 Body Mass Index (BMI) 40.0 and over, adult: Secondary | ICD-10-CM

## 2018-07-17 DIAGNOSIS — I10 Essential (primary) hypertension: Secondary | ICD-10-CM | POA: Diagnosis not present

## 2018-07-17 DIAGNOSIS — E119 Type 2 diabetes mellitus without complications: Secondary | ICD-10-CM

## 2018-07-17 DIAGNOSIS — Z1231 Encounter for screening mammogram for malignant neoplasm of breast: Secondary | ICD-10-CM

## 2018-07-17 LAB — POCT URINALYSIS DIPSTICK
Bilirubin, UA: NEGATIVE
Glucose, UA: NEGATIVE
Ketones, UA: NEGATIVE
Nitrite, UA: NEGATIVE
Protein, UA: POSITIVE — AB
Spec Grav, UA: 1.025 (ref 1.010–1.025)
Urobilinogen, UA: 0.2 E.U./dL
pH, UA: 5.5 (ref 5.0–8.0)

## 2018-07-17 LAB — POCT UA - MICROALBUMIN
Albumin/Creatinine Ratio, Urine, POC: 300
Creatinine, POC: 200 mg/dL
Microalbumin Ur, POC: 80 mg/L

## 2018-07-17 NOTE — Progress Notes (Addendum)
Subjective:     Patient ID: Kristen [name] , female    DOB: [birth year] , 53 y.o.   MRN: [id number]   Chief Complaint  Patient presents with  . Annual Exam    HPI  Here for HM  Continues to see the hematologist and cardiologist and one more visit with nephrologist.  She feels like she has plateaued with her weight loss- she is eating more fruit.    Wt Readings from Last 3 Encounters: 07/17/18 : 278 lb 9.6 oz (126.4 kg) 04/20/18 : 281 lb (127.5 kg) 04/03/18 : 289 lb 11.2 oz (131.4 kg)  Her weight in December 2019 - 319 lbs.    She continues to take Eliquis twice a day thought to stay on for at least 12 - 18 months.      Past Medical History:  Diagnosis Date  . Arthritis   . Diabetes ([place])    Type 2  . Hypertension   . Iron deficiency anemia 03/07/2018  . URI (upper respiratory infection)    took antibiotic and prednisone oct 2019 resolved  . Uses contact lenses      Family History  Problem Relation Age of Onset  . Hypertension Mother   . Diabetes Mother   . Hypertension Father   . Diabetes Father   . COPD Father   . Stroke Father   . Congestive Heart Failure Father   . Diabetes Sister   . Hypertension Sister      Current Outpatient Medications:  .  acetaminophen (TYLENOL) 500 MG tablet, Take 500 mg by mouth every 6 (six) hours as needed., Disp: , Rfl:  .  albuterol (PROVENTIL HFA;VENTOLIN HFA) 108 (90 Base) MCG/ACT inhaler, Inhale 1-2 puffs into the lungs every 6 (six) hours as needed for wheezing or shortness of breath., Disp: 1 Inhaler, Rfl: 2 .  blood glucose meter kit and supplies KIT, Dispense based on patient and insurance preference. Use up to four times daily as directed. (FOR ICD-9 250.00, 250.01)., Disp: 1 each, Rfl: 0 .  cetirizine (ZYRTEC) 10 MG tablet, Take 10 mg by mouth daily., Disp: , Rfl:  .  docusate sodium (COLACE) 100 MG capsule, Take 100 mg by mouth 2 (two) times daily., Disp: , Rfl:  .  ELIQUIS 5 MG TABS tablet, TAKE 1 TABLET BY  MOUTH TWICE A DAY, Disp: 60 tablet, Rfl: 1 .  FLUoxetine (PROZAC) 20 MG tablet, Take 1 tablet (20 mg total) by mouth daily., Disp: 90 tablet, Rfl: 1 .  Multiple Vitamins-Minerals (BARIATRIC MULTIVITAMINS/IRON PO), Take 1 tablet by mouth 2 (two) times daily. Patient using bariatric transdermal patch with calcium B12 and iron, Disp: , Rfl:  .  NONFORMULARY OR COMPOUNDED ITEM, Iron patch apply every 8 hours, Disp: , Rfl:  .  olmesartan (BENICAR) 20 MG tablet, Take 20 mg by mouth daily. Pt is taking 85m, Disp: , Rfl:    Allergies  Allergen Reactions  . Amoxicillin Hives    Has patient had a PCN reaction causing immediate rash, facial/tongue/throat swelling, SOB or lightheadedness with hypotension: No Has patient had a PCN reaction causing severe rash involving mucus membranes or skin necrosis: Yes Has patient had a PCN reaction that required hospitalization No Has patient had a PCN reaction occurring within the last 10 years: No If all of the above answers are "NO", then may proceed with Cephalosporin use.   . Fish Allergy Hives     Review of Systems  Constitutional: Negative.   HENT: Negative.  Eyes: Negative.   Respiratory: Negative.   Cardiovascular: Negative.  Negative for chest pain, palpitations and leg swelling.  Gastrointestinal: Negative.   Endocrine: Negative.   Genitourinary: Negative.   Musculoskeletal: Negative.   Skin: Negative.   Allergic/Immunologic: Negative.   Neurological: Negative.   Hematological: Negative.   Psychiatric/Behavioral: Negative.      Today's Vitals   07/17/18 0857  BP: 132/84  Pulse: 86  Temp: 98 F (36.7 C)  TempSrc: Oral  Weight: 278 lb 9.6 oz (126.4 kg)  Height: 5' 3.8" (1.621 m)  PainSc: 0-No pain   Body mass index is 48.12 kg/m.   Objective:  Physical Exam Vitals signs reviewed.  Constitutional:      Appearance: Normal appearance. She is well-developed. She is obese.  HENT:     Head: Normocephalic and atraumatic.     Right  Ear: Hearing, tympanic membrane, ear canal and external ear normal.     Left Ear: Hearing, tympanic membrane, ear canal and external ear normal.     Nose: Nose normal.     Mouth/Throat:     Mouth: Mucous membranes are moist.  Eyes:     General: Lids are normal.     Extraocular Movements: Extraocular movements intact.     Conjunctiva/sclera: Conjunctivae normal.     Pupils: Pupils are equal, round, and reactive to light.     Funduscopic exam:    Right eye: No papilledema.        Left eye: No papilledema.  Neck:     Musculoskeletal: Full passive range of motion without pain, normal range of motion and neck supple.     Thyroid: No thyroid mass.     Vascular: No carotid bruit.  Cardiovascular:     Rate and Rhythm: Normal rate and regular rhythm.     Pulses: Normal pulses.     Heart sounds: Normal heart sounds. No murmur.  Pulmonary:     Effort: Pulmonary effort is normal.     Breath sounds: Normal breath sounds.  Abdominal:     General: Abdomen is flat. Bowel sounds are normal.     Palpations: Abdomen is soft.  Musculoskeletal: Normal range of motion.        General: No swelling.     Right lower leg: No edema.     Left lower leg: No edema.  Skin:    General: Skin is warm and dry.     Capillary Refill: Capillary refill takes less than 2 seconds.  Neurological:     General: No focal deficit present.     Mental Status: She is alert and oriented to person, place, and time.     Cranial Nerves: No cranial nerve deficit.     Sensory: No sensory deficit.  Psychiatric:        Mood and Affect: Mood normal.        Behavior: Behavior normal.        Thought Content: Thought content normal.        Judgment: Judgment normal.         Assessment And Plan:     1. Health maintenance examination . Behavior modifications discussed and diet history reviewed.   . Pt will continue to exercise regularly and modify diet with low GI, plant based foods and decrease intake of processed foods.   . Recommend intake of daily multivitamin, Vitamin D, and calcium.  . Recommend for preventive screenings, as well as recommend immunizations that include influenza, TDAP . Colonoscopy due 2028 - Lipid  Profile  2. Essential hypertension . B/P is controlled.  . CMP ordered to check renal function.  . The importance of regular exercise and dietary modification was stressed to the patient.  . Stressed importance of losing ten percent of her body weight to help with B/P control.  . The weight loss would help with decreasing cardiac and cancer risk as well.  - POCT Urinalysis Dipstick (81002) - POCT UA - Microalbumin - EKG 12-Lead  3. Type 2 diabetes mellitus without complication, without long-term current use of insulin (HCC) Chronic, controlled Continue with current medications Encouraged to limit intake of sugary foods and drinks Encouraged to increase physical activity to 150 minutes per week once you are cleared by cardiology and weight management - Hemoglobin A1c  4. Class 3 severe obesity due to excess calories without serious comorbidity with body mass index (BMI) of 45.0 to 49.9 in adult [facility])  She is doing well since her gastric sleeve with her weight and lost approximately 30 lbs.  She continues with her follow up with weight management  5. Screening mammogram, encounter for  Pt instructed on Self Breast Exam.According to ACOG guidelines Women aged 39 and older are recommended to get an annual mammogram. Form completed and given to patient contact the [facility] for appointment scheduing.   Pt encouraged to get annual mammogram - MM Digital Screening; Future    [name], FNP    THE PATIENT IS ENCOURAGED TO PRACTICE SOCIAL DISTANCING DUE TO THE COVID-19 PANDEMIC.

## 2018-07-18 ENCOUNTER — Encounter: Payer: Self-pay | Admitting: Nurse Practitioner

## 2018-07-18 LAB — LIPID PANEL
Chol/HDL Ratio: 4.3 ratio (ref 0.0–4.4)
Cholesterol, Total: 184 mg/dL (ref 100–199)
HDL: 43 mg/dL (ref >39)
LDL Calculated: 111 mg/dL — ABNORMAL HIGH (ref 0–99)
Triglycerides: 148 mg/dL (ref 0–149)
VLDL Cholesterol Cal: 30 mg/dL (ref 5–40)

## 2018-07-18 LAB — HEMOGLOBIN A1C
Est. average glucose Bld gHb Est-mCnc: 146 mg/dL
Hgb A1c MFr Bld: 6.7 % — ABNORMAL HIGH (ref 4.8–5.6)

## 2018-07-19 ENCOUNTER — Ambulatory Visit (INDEPENDENT_AMBULATORY_CARE_PROVIDER_SITE_OTHER): Payer: BC Managed Care – PPO | Admitting: Physician Assistant

## 2018-07-19 ENCOUNTER — Other Ambulatory Visit: Payer: Self-pay

## 2018-07-19 ENCOUNTER — Ambulatory Visit: Payer: Self-pay

## 2018-07-19 ENCOUNTER — Encounter: Payer: Self-pay | Admitting: Physician Assistant

## 2018-07-19 DIAGNOSIS — Z96641 Presence of right artificial hip joint: Secondary | ICD-10-CM | POA: Diagnosis not present

## 2018-07-19 DIAGNOSIS — M1711 Unilateral primary osteoarthritis, right knee: Secondary | ICD-10-CM | POA: Diagnosis not present

## 2018-07-19 MED ORDER — LIDOCAINE HCL 1 % IJ SOLN
3.0000 mL | INTRAMUSCULAR | Status: AC | PRN
  Administered 2018-07-19: 3 mL

## 2018-07-19 MED ORDER — METHYLPREDNISOLONE ACETATE 40 MG/ML IJ SUSP
40.0000 mg | INTRAMUSCULAR | Status: AC | PRN
Start: 2018-07-19 — End: 2018-07-19
  Administered 2018-07-19: 40 mg via INTRA_ARTICULAR

## 2018-07-19 NOTE — Progress Notes (Signed)
Office Visit Note   Patient: Kristen [name]           Date of Birth: Oct 05, 1965           MRN: [id number] Visit Date: 07/19/2018              Requested by: Glendale Chard, [address] [place], [place] [address] PCP: Glendale Chard, MD   Assessment & Plan: Visit Diagnoses:  1. Status post total replacement of right hip   2. Unilateral primary osteoarthritis, right knee     Plan:  She understands that she can have cortisone injections in her knee done 1 every 3 months.  She will watch her glucose levels closely over the next couple days.  Should follow-up with [place] on as-needed basis.  Follow-Up Instructions: Return for Supplemental injection.   Orders:  Orders Placed This Encounter  Procedures  . Large Joint Inj: R knee  . XR HIP UNILAT W OR W/O PELVIS 2-3 VIEWS RIGHT   No orders of the defined types were placed in this encounter.     Procedures: Large Joint Inj: R knee on 07/19/2018 4:24 PM Indications: pain Details: 22 G 1.5 in needle, anterolateral approach  Arthrogram: No  Medications: 3 mL lidocaine 1 %; 40 mg methylPREDNISolone acetate 40 MG/ML Outcome: tolerated well, no immediate complications Procedure, treatment alternatives, risks and benefits explained, specific risks discussed. Consent was given by the patient. Immediately prior to procedure a time out was called to verify the correct patient, procedure, equipment, support staff and site/side marked as required. Patient was prepped and draped in the usual sterile fashion.       Clinical Data: No additional findings.   Subjective: Chief Complaint  Patient presents with  . Right Hip - Pain    HPI Kristen [name] returns today 11 months status post right total hip arthroplasty.  She states her right hip is doing well.  She is lost some 50 pounds since we last saw her.  She did undergo laparoscopic sleeve gastrectomy in December 2019.  Unfortunately February 13, 2018 and she did have a  syncopal episode and non-STEMI event.  She was found to have pulmonary embolism.  She is diabetic and 2 days ago her hemoglobin A1c was 6.7. Patient has known osteoarthritis of both knees and is requesting injection of the right knee which is bothering her.  Review of Systems No fevers or chills  Objective: Vital Signs: LMP 07/15/2018 (Approximate)   Physical Exam Constitutional:      Appearance: She is not ill-appearing or diaphoretic.  Pulmonary:     Effort: Pulmonary effort is normal.  Neurological:     Mental Status: She is alert and oriented to person, place, and time.     Ortho Exam Right hip good range of motion without pain.  Right calf supple nontender.  Right knee good range of motion tenderness along medial joint line.  No abnormal warmth erythema of the right knee. Specialty Comments:  No specialty comments available.  Imaging: Xr Hip Unilat W Or W/o Pelvis 2-3 Views Right  Result Date: 07/19/2018 AP pelvis lateral view of the right hip: Shows well-seated right total hip arthroplasty without any complications.    PMFS History: Patient Active Problem List   Diagnosis Date Noted  . Iron deficiency anemia 03/07/2018  . Pulmonary embolism ([place])   . Acute renal failure ([place])   . Anemia   . Thrombocytosis ([place])   . NSTEMI (non-ST elevated myocardial infarction) ([place])  02/13/2018  . Status post gastrectomy 02/05/2018  . Essential hypertension 02/05/2018  . Type 2 diabetes mellitus without complication, without long-term current use of insulin ([place]) 02/05/2018  . Obesity 01/29/2018  . Status post total replacement of right hip 08/04/2017  . Unilateral primary osteoarthritis, right knee 07/06/2016  . Unilateral primary osteoarthritis, right hip 07/06/2016  . Acute pain of right knee 07/06/2016  . Pain in right hip 07/06/2016   Past Medical History:  Diagnosis Date  . Arthritis   . Diabetes ([place])    Type 2  . Hypertension   . Iron deficiency anemia 03/07/2018   . URI (upper respiratory infection)    took antibiotic and prednisone oct 2019 resolved  . Uses contact lenses     Family History  Problem Relation Age of Onset  . Hypertension Mother   . Diabetes Mother   . Hypertension Father   . Diabetes Father   . COPD Father   . Stroke Father   . Congestive Heart Failure Father   . Diabetes Sister   . Hypertension Sister     Past Surgical History:  Procedure Laterality Date  . COLONOSCOPY WITH PROPOFOL N/A 03/18/2016   Procedure: COLONOSCOPY WITH PROPOFOL;  Surgeon: Carol Ada, MD;  [facility];  Service: Endoscopy;  Laterality: N/A;  . LAPAROSCOPIC GASTRIC SLEEVE RESECTION N/A 01/29/2018   Procedure: LAPAROSCOPIC GASTRIC SLEEVE RESECTION WITH UPPER ENDO AND ERAS PATHWAY;  Surgeon: Kinsinger, Arta Bruce, MD;  [facility];  Service: General;  Laterality: N/A;  . RIGHT/LEFT HEART CATH AND CORONARY ANGIOGRAPHY N/A 02/15/2018   Procedure: RIGHT/LEFT HEART CATH AND CORONARY ANGIOGRAPHY;  Surgeon: Corey Skains, MD;  Location: [facility];  Service: Cardiovascular;  Laterality: N/A;  . TOTAL HIP ARTHROPLASTY Right 08/04/2017   Procedure: RIGHT TOTAL HIP ARTHROPLASTY ANTERIOR APPROACH;  Surgeon: Mcarthur Rossetti, MD;  [facility];  Service: Orthopedics;  Laterality: Right;   Social History   Occupational History  . Not on file  Tobacco Use  . Smoking status: Never Smoker  . Smokeless tobacco: Never Used  Substance and Sexual Activity  . Alcohol use: Not Currently    Comment: OCCASIONAL   . Drug use: No  . Sexual activity: Not on file

## 2018-07-20 ENCOUNTER — Encounter: Payer: Self-pay | Admitting: Internal Medicine

## 2018-07-20 ENCOUNTER — Encounter: Payer: BLUE CROSS/BLUE SHIELD | Admitting: Nurse Practitioner

## 2018-07-22 ENCOUNTER — Encounter: Payer: Self-pay | Admitting: Nurse Practitioner

## 2018-07-24 ENCOUNTER — Encounter: Payer: BLUE CROSS/BLUE SHIELD | Admitting: Nurse Practitioner

## 2018-08-01 ENCOUNTER — Encounter: Payer: Self-pay | Admitting: Dietician

## 2018-08-01 NOTE — Progress Notes (Signed)
Patient rescheduled her MNT follow-up visit from 08/02/18 to 08/16/18.

## 2018-08-02 ENCOUNTER — Ambulatory Visit: Payer: BLUE CROSS/BLUE SHIELD | Admitting: Dietician

## 2018-08-13 NOTE — Addendum Note (Signed)
Addended by: [name] F on: 08/13/2018 04:39 PM   Modules accepted: Orders

## 2018-08-16 ENCOUNTER — Other Ambulatory Visit: Payer: Self-pay

## 2018-08-16 ENCOUNTER — Encounter: Payer: BC Managed Care – PPO | Attending: General Surgery | Admitting: Dietician

## 2018-08-16 ENCOUNTER — Encounter: Payer: Self-pay | Admitting: Dietician

## 2018-08-16 VITALS — Ht 65.0 in | Wt 279.3 lb

## 2018-08-16 DIAGNOSIS — Z6841 Body Mass Index (BMI) 40.0 and over, adult: Secondary | ICD-10-CM

## 2018-08-16 NOTE — Progress Notes (Signed)
Follow-up visit:  6 Months Post-Operative Sleeve Gastrectomy Surgery  Medical Nutrition Therapy:  Appt start time: 1630 end time:  6004.  Primary concerns today: Post-operative Bariatric Surgery Nutrition Management.   Learning Readiness:   Change in progress  Weight: 279.3lbs Height: 5'5" Weight at previous NDES visit: 289.7lbs    Progress:  Weight loss of 10.4lbs in past 4 months; total loss of 37.2lbs since 12/01/17.  Patient has decreased several clothing sizes.   Portions of protein foods have gradually increased and patient is consuming low-carb vegetables and some fruits during her day. She is limiting starches to small portions, if any, with meals.   Exercise is increasing slowly due to knee pain and recent PE.    Dietary recall: Breakfast: sometimes late am (drinks coffee early) 3 sausage links/ 3 strips bacon +1 egg; or light yogurt; or banana Snack: fruit  Lunch: maybe 3pm if late breakfast--  Kind protein bar (8-12g protein);  Snack: fruit  Dinner: 7/1 2-3oz steak + 1/3 -1/2 small potato + salad; usually lean grilled meats and veg including some salads (not iceberg) with cucumber, tomato, light dressing Snack: hummus; pistachios; peanut butter, +/or water  Fluid intake: 64-100oz daily -- water, coffee, small portion pineapple juice (read info about decreased risk of COVID, about 2oz portion 1-2x daily)  Estimated total protein intake: 60-65g daily  Medications: acetaminophen prn, albuterol inhaler prn, apixaban, cetirizine, FLUoxetine, olmesartan, pantoprazole Supplementation: transdermal bariatric multivitamin patch, B12 and iron patches +  Calcium patch  Using straws: no Drinking while eating: no Hair loss: alopecia Carbonated beverages: no N/V/D/C: no N, V, D; taking stool softener for Constipation Dumping syndrome: no   Recent physical activity:  Walking 12 minutes, 2-3 times a week planning to increase to 3 days a week; Hinge program (stretching  exercises for knees) 3x a week.   Progress Towards Goal(s):  In progress.  Handouts given during visit include:  Phase VII bariatric diet handout  800 - 1000kcal bariatric menus (AND)  Goals and Instructions   Nutritional Diagnosis:  [place]-3.3 Overweight/obesity As related to history of excess calories and inactivity.  As evidenced by patient with current BMI of 46.48, following bariatric diet guidelines for ongoing weight loss after bariatric surgery.    Intervention:    Reviewed progress since previous visit.  Discussed advancement of diet, advising ongoing portion control, emphasis on lean proteins and low-carb vegetables and fruits, and low fat and low sugar food choices.   Discussed importance of life-long vitamin supplementation.  Advised gradual increase in exercise duration and frequency as tolerated to promote long-term weight loss success.  Teaching Method Utilized:  Visual Auditory Hands on  Barriers to learning/adherence to lifestyle change: none  Demonstrated degree of understanding via:  Teach Back   Monitoring/Evaluation:  Dietary intake, exercise, and body weight. Follow up in 3 months for 9 month post-op visit.

## 2018-08-16 NOTE — Patient Instructions (Signed)
   Continue with current eating pattern, great job meeting protein and fluid goals!  Keep up regular exercise and increase frequency and duration as tolerated.

## 2018-08-27 ENCOUNTER — Encounter: Payer: Self-pay | Admitting: Internal Medicine

## 2018-09-04 ENCOUNTER — Inpatient Hospital Stay: Payer: BC Managed Care – PPO

## 2018-09-05 ENCOUNTER — Inpatient Hospital Stay: Payer: BC Managed Care – PPO | Admitting: Oncology

## 2018-09-05 ENCOUNTER — Inpatient Hospital Stay: Payer: BC Managed Care – PPO

## 2018-09-10 ENCOUNTER — Other Ambulatory Visit: Payer: Self-pay

## 2018-09-11 ENCOUNTER — Other Ambulatory Visit: Payer: Self-pay

## 2018-09-11 ENCOUNTER — Inpatient Hospital Stay: Payer: BC Managed Care – PPO | Attending: Oncology

## 2018-09-11 ENCOUNTER — Other Ambulatory Visit: Payer: Self-pay | Admitting: Oncology

## 2018-09-11 DIAGNOSIS — Z7901 Long term (current) use of anticoagulants: Secondary | ICD-10-CM | POA: Diagnosis not present

## 2018-09-11 DIAGNOSIS — Z8249 Family history of ischemic heart disease and other diseases of the circulatory system: Secondary | ICD-10-CM | POA: Insufficient documentation

## 2018-09-11 DIAGNOSIS — D500 Iron deficiency anemia secondary to blood loss (chronic): Secondary | ICD-10-CM

## 2018-09-11 DIAGNOSIS — E538 Deficiency of other specified B group vitamins: Secondary | ICD-10-CM | POA: Diagnosis not present

## 2018-09-11 DIAGNOSIS — Z79899 Other long term (current) drug therapy: Secondary | ICD-10-CM | POA: Diagnosis not present

## 2018-09-11 DIAGNOSIS — Z86711 Personal history of pulmonary embolism: Secondary | ICD-10-CM | POA: Diagnosis not present

## 2018-09-11 DIAGNOSIS — Z9884 Bariatric surgery status: Secondary | ICD-10-CM | POA: Insufficient documentation

## 2018-09-11 LAB — CBC WITH DIFFERENTIAL/PLATELET
Abs Immature Granulocytes: 0.01 10*3/uL (ref 0.00–0.07)
Basophils Absolute: 0 10*3/uL (ref 0.0–0.1)
Basophils Relative: 0 %
Eosinophils Absolute: 0.1 10*3/uL (ref 0.0–0.5)
Eosinophils Relative: 1 %
HCT: 36.8 % (ref 36.0–46.0)
Hemoglobin: 12 g/dL (ref 12.0–15.0)
Immature Granulocytes: 0 %
Lymphocytes Relative: 42 %
Lymphs Abs: 2.4 10*3/uL (ref 0.7–4.0)
MCH: 29.9 pg (ref 26.0–34.0)
MCHC: 32.6 g/dL (ref 30.0–36.0)
MCV: 91.8 fL (ref 80.0–100.0)
Monocytes Absolute: 0.5 10*3/uL (ref 0.1–1.0)
Monocytes Relative: 8 %
Neutro Abs: 2.8 10*3/uL (ref 1.7–7.7)
Neutrophils Relative %: 49 %
Platelets: 294 10*3/uL (ref 150–400)
RBC: 4.01 MIL/uL (ref 3.87–5.11)
RDW: 14 % (ref 11.5–15.5)
WBC: 5.8 10*3/uL (ref 4.0–10.5)
nRBC: 0 % (ref 0.0–0.2)

## 2018-09-11 LAB — COMPREHENSIVE METABOLIC PANEL
ALT: 13 U/L (ref 0–44)
AST: 22 U/L (ref 15–41)
Albumin: 3.8 g/dL (ref 3.5–5.0)
Alkaline Phosphatase: 75 U/L (ref 38–126)
Anion gap: 8 (ref 5–15)
BUN: 20 mg/dL (ref 6–20)
CO2: 25 mmol/L (ref 22–32)
Calcium: 8.9 mg/dL (ref 8.9–10.3)
Chloride: 109 mmol/L (ref 98–111)
Creatinine, Ser: 0.93 mg/dL (ref 0.44–1.00)
GFR calc Af Amer: >60 mL/min (ref >60)
GFR calc non Af Amer: >60 mL/min (ref >60)
Glucose, Bld: 141 mg/dL — ABNORMAL HIGH (ref 70–99)
Potassium: 3.9 mmol/L (ref 3.5–5.1)
Sodium: 142 mmol/L (ref 135–145)
Total Bilirubin: 0.6 mg/dL (ref 0.3–1.2)
Total Protein: 7.9 g/dL (ref 6.5–8.1)

## 2018-09-11 LAB — FERRITIN: Ferritin: 106 ng/mL (ref 11–307)

## 2018-09-11 LAB — IRON AND TIBC
Iron: 83 ug/dL (ref 28–170)
Saturation Ratios: 28 % (ref 10.4–31.8)
TIBC: 296 ug/dL (ref 250–450)
UIBC: 213 ug/dL

## 2018-09-11 LAB — FOLATE: Folate: 6.8 ng/mL (ref >5.9)

## 2018-09-11 LAB — VITAMIN B12: Vitamin B-12: 151 pg/mL — ABNORMAL LOW (ref 180–914)

## 2018-09-11 NOTE — Telephone Encounter (Signed)
I am planning to decrease her dose to 2.5mg BID after I see her this week.

## 2018-09-12 ENCOUNTER — Encounter: Payer: Self-pay | Admitting: Oncology

## 2018-09-12 ENCOUNTER — Inpatient Hospital Stay (HOSPITAL_BASED_OUTPATIENT_CLINIC_OR_DEPARTMENT_OTHER): Payer: BC Managed Care – PPO | Admitting: Oncology

## 2018-09-12 ENCOUNTER — Other Ambulatory Visit: Payer: Self-pay

## 2018-09-12 ENCOUNTER — Inpatient Hospital Stay: Payer: BC Managed Care – PPO

## 2018-09-12 VITALS — BP 135/80 | HR 76 | Temp 99.1°F | Resp 18 | Wt 281.3 lb

## 2018-09-12 DIAGNOSIS — Z9884 Bariatric surgery status: Secondary | ICD-10-CM

## 2018-09-12 DIAGNOSIS — Z7901 Long term (current) use of anticoagulants: Secondary | ICD-10-CM

## 2018-09-12 DIAGNOSIS — E538 Deficiency of other specified B group vitamins: Secondary | ICD-10-CM

## 2018-09-12 DIAGNOSIS — D500 Iron deficiency anemia secondary to blood loss (chronic): Secondary | ICD-10-CM

## 2018-09-12 DIAGNOSIS — Z79899 Other long term (current) drug therapy: Secondary | ICD-10-CM

## 2018-09-12 DIAGNOSIS — Z86711 Personal history of pulmonary embolism: Secondary | ICD-10-CM

## 2018-09-12 DIAGNOSIS — Z8249 Family history of ischemic heart disease and other diseases of the circulatory system: Secondary | ICD-10-CM

## 2018-09-12 MED ORDER — APIXABAN 2.5 MG PO TABS: 2.5000 mg | ORAL_TABLET | Freq: Two times a day (BID) | ORAL | 3 refills | Status: DC

## 2018-09-12 NOTE — Progress Notes (Signed)
Pt in for follow up, denies any difficulties or concerns.  Reports energy levels much improved.

## 2018-09-13 NOTE — Progress Notes (Signed)
Hematology/Oncology Follow Up Note [facility]  [phone] [phone]  Patient Care Team: Glendale Chard, MD as PCP - General (Internal Medicine)   Name of the patient: Kristen [name]  [id number]  04-18-1965   REASON FOR VISIT  follow-up for management of pulmonary embolism  PERTINENT HEMATOLOGY HISTORY Kristen [name] is a 53 y.o.afemale who has above oncology history reviewed by me today presented for follow up visit for management of pulmonary embolism.  Patient was seen by me during hospitalization.. Patient presents to ER for evaluation of syncope episode. Patient fainted but did not fall or hurt herself as family member caught patient.  EKG in ED showed ST changed. Troponin elevated and started on heparin drip and admitted.  On day 1 of admission, CODE BLUE was called and patient was unresponsive and received brief chest compression. Patient woke up and talked.  No cardiac arrest.  She reports feeling dizzy and lightheaded and passed out.  Similar to the syncope episode prior to admission. Cardiac catheterization showed normal LV systolic function and hyperdynamic in nature with ejection fraction of 70% consistent with hyper trophic cardiomyopathy.  Significantly elevated pulmonary pressure possibly consistent with pulmonary embolism.  No cardiac intervention due to normal coronary arteries. Patient had VQ scan on 02/15/2018 which showed hypermobility for pulmonary embolism Patient was on heparin drip and was transitioned to Eliquis 10 mg twice daily started on 02/16/2018.  INTERVAL HISTORY Kristen [name] is a 53 y.o.female who has above oncology history reviewed by me today presented for follow up visit for management of pulmonary embolism.  Patient has been on Eliquis 5 mg twice daily.  She has completed 6 months of anticoagulation. Reports doing well.  Denies hematochezia, hematuria, hematemesis, epistaxis, black tarry stool  or easy bruising.  Denies any chest pain, cough, hemoptysis or shortness of breath. She is active. She used to take bariatric vitamin orally.  She feels the pill is too big so she bought over-the-counter bariatric transdermal patch and she uses daily. History of gastric sleeve. She is losing weight. No new complaints today .     Review of Systems  Constitutional: Negative for appetite change, chills, fatigue and fever.  HENT:   Negative for hearing loss and voice change.   Eyes: Negative for eye problems.  Respiratory: Negative for chest tightness, cough and shortness of breath.   Cardiovascular: Negative for chest pain.  Gastrointestinal: Negative for abdominal distention, abdominal pain and blood in stool.  Endocrine: Negative for hot flashes.  Genitourinary: Negative for difficulty urinating and frequency.   Musculoskeletal: Negative for arthralgias.  Skin: Negative for itching and rash.  Neurological: Negative for extremity weakness.  Hematological: Negative for adenopathy.  Psychiatric/Behavioral: Negative for confusion.      Allergies  Allergen Reactions  . Amoxicillin Hives    Has patient had a PCN reaction causing immediate rash, facial/tongue/throat swelling, SOB or lightheadedness with hypotension: No Has patient had a PCN reaction causing severe rash involving mucus membranes or skin necrosis: Yes Has patient had a PCN reaction that required hospitalization No Has patient had a PCN reaction occurring within the last 10 years: No If all of the above answers are "NO", then may proceed with Cephalosporin use.   . Fish Allergy Hives     Past Medical History:  Diagnosis Date  . Arthritis   . Diabetes ([place])    Type 2  . Hypertension   . Iron deficiency anemia 03/07/2018  . URI (upper respiratory infection)  took antibiotic and prednisone oct 2019 resolved  . Uses contact lenses      Past Surgical History:  Procedure Laterality Date  . COLONOSCOPY WITH  PROPOFOL N/A 03/18/2016   Procedure: COLONOSCOPY WITH PROPOFOL;  Surgeon: Carol Ada, MD;  Location: [facility];  Service: Endoscopy;  Laterality: N/A;  . LAPAROSCOPIC GASTRIC SLEEVE RESECTION N/A 01/29/2018   Procedure: LAPAROSCOPIC GASTRIC SLEEVE RESECTION WITH UPPER ENDO AND ERAS PATHWAY;  Surgeon: Kinsinger, Arta Bruce, MD;  Location: [facility];  Service: General;  Laterality: N/A;  . RIGHT/LEFT HEART CATH AND CORONARY ANGIOGRAPHY N/A 02/15/2018   Procedure: RIGHT/LEFT HEART CATH AND CORONARY ANGIOGRAPHY;  Surgeon: Corey Skains, MD;  Location: [place] [facility];  Service: Cardiovascular;  Laterality: N/A;  . TOTAL HIP ARTHROPLASTY Right 08/04/2017   Procedure: RIGHT TOTAL HIP ARTHROPLASTY ANTERIOR APPROACH;  Surgeon: Mcarthur Rossetti, MD;  Location: [facility];  Service: Orthopedics;  Laterality: Right;    Social History   Socioeconomic History  . Marital status: Married    Spouse name: Not on file  . Number of children: Not on file  . Years of education: Not on file  . Highest education level: Not on file  Occupational History  . Not on file  Social Needs  . Financial resource strain: Not on file  . Food insecurity    Worry: Not on file    Inability: Not on file  . Transportation needs    Medical: Not on file    Non-medical: Not on file  Tobacco Use  . Smoking status: Never Smoker  . Smokeless tobacco: Never Used  Substance and Sexual Activity  . Alcohol use: Not Currently    Comment: OCCASIONAL   . Drug use: No  . Sexual activity: Not on file  Lifestyle  . Physical activity    Days per week: Not on file    Minutes per session: Not on file  . Stress: Not on file  Relationships  . Social Herbalist on phone: Not on file    Gets together: Not on file    Attends religious service: Not on file    Active member of club or organization: Not on file    Attends meetings of clubs or organizations: Not on file    Relationship status: Not on file  .  Intimate partner violence    Fear of current or ex partner: Not on file    Emotionally abused: Not on file    Physically abused: Not on file    Forced sexual activity: Not on file  Other Topics Concern  . Not on file  Social History Narrative  . Not on file    Family History  Problem Relation Age of Onset  . Hypertension Mother   . Diabetes Mother   . Hypertension Father   . Diabetes Father   . COPD Father   . Stroke Father   . Congestive Heart Failure Father   . Diabetes Sister   . Hypertension Sister      Current Outpatient Medications:  .  acetaminophen (TYLENOL) 500 MG tablet, Take 500 mg by mouth every 6 (six) hours as needed., Disp: , Rfl:  .  albuterol (PROVENTIL HFA;VENTOLIN HFA) 108 (90 Base) MCG/ACT inhaler, Inhale 1-2 puffs into the lungs every 6 (six) hours as needed for wheezing or shortness of breath., Disp: 1 Inhaler, Rfl: 2 .  blood glucose meter kit and supplies KIT, Dispense based on patient and insurance preference. Use up to  four times daily as directed. (FOR ICD-9 250.00, 250.01)., Disp: 1 each, Rfl: 0 .  cetirizine (ZYRTEC) 10 MG tablet, Take 10 mg by mouth daily., Disp: , Rfl:  .  docusate sodium (COLACE) 100 MG capsule, Take 100 mg by mouth 2 (two) times daily., Disp: , Rfl:  .  FLUoxetine (PROZAC) 20 MG tablet, TAKE 1 TABLET BY MOUTH EVERY DAY, Disp: 90 tablet, Rfl: 1 .  Multiple Vitamins-Minerals (BARIATRIC MULTIVITAMINS/IRON PO), Take 1 tablet by mouth 2 (two) times daily. Patient using bariatric transdermal patch with calcium B12 and iron, Disp: , Rfl:  .  NONFORMULARY OR COMPOUNDED ITEM, Iron patch apply every 8 hours, Disp: , Rfl:  .  olmesartan (BENICAR) 20 MG tablet, Take 40 mg by mouth daily. Pt is taking 23m, Disp: , Rfl:  .  apixaban (ELIQUIS) 2.5 MG TABS tablet, Take 1 tablet (2.5 mg total) by mouth 2 (two) times daily., Disp: 60 tablet, Rfl: 3  Physical exam:  Vitals:   09/12/18 1425  BP: 135/80  Pulse: 76  Resp: 18  Temp: 99.1 F (37.3  C)  TempSrc: Tympanic  SpO2: 97%  Weight: 281 lb 4.8 oz (127.6 kg)   Physical Exam Constitutional:      General: She is not in acute distress.    Comments: Morbidly obese.   HENT:     Head: Normocephalic and atraumatic.     Comments: Chronic alopecia Eyes:     General: No scleral icterus.    Pupils: Pupils are equal, round, and reactive to light.  Neck:     Musculoskeletal: Normal range of motion and neck supple.  Cardiovascular:     Rate and Rhythm: Normal rate and regular rhythm.     Heart sounds: Normal heart sounds.  Pulmonary:     Effort: Pulmonary effort is normal. No respiratory distress.     Breath sounds: No wheezing.  Abdominal:     General: Bowel sounds are normal. There is no distension.     Palpations: Abdomen is soft. There is no mass.     Tenderness: There is no abdominal tenderness.  Musculoskeletal: Normal range of motion.        General: No deformity.  Skin:    General: Skin is warm and dry.     Findings: No erythema or rash.  Neurological:     Mental Status: She is alert and oriented to person, place, and time.     Cranial Nerves: No cranial nerve deficit.     Coordination: Coordination normal.  Psychiatric:        Behavior: Behavior normal.        Thought Content: Thought content normal.     CMP Latest Ref Rng & Units 09/11/2018  Glucose 70 - 99 mg/dL 141(H)  BUN 6 - 20 mg/dL 20  Creatinine 0.44 - 1.00 mg/dL 0.93  Sodium 135 - 145 mmol/L 142  Potassium 3.5 - 5.1 mmol/L 3.9  Chloride 98 - 111 mmol/L 109  CO2 22 - 32 mmol/L 25  Calcium 8.9 - 10.3 mg/dL 8.9  Total Protein 6.5 - 8.1 g/dL 7.9  Total Bilirubin 0.3 - 1.2 mg/dL 0.6  Alkaline Phos 38 - 126 U/L 75  AST 15 - 41 U/L 22  ALT 0 - 44 U/L 13   CBC Latest Ref Rng & Units 09/11/2018  WBC 4.0 - 10.5 K/uL 5.8  Hemoglobin 12.0 - 15.0 g/dL 12.0  Hematocrit 36.0 - 46.0 % 36.8  Platelets 150 - 400 K/uL 294   RADIOGRAPHIC  STUDIES: I have personally reviewed the radiological images as listed  and agreed with the findings in the report. No results found.   Assessment and plan Patient is a 53 y.o. female female with history of morbid obesity status post laparoscopic sleeve gastrectomy on 01/29/2018, diabetes, hypertension, presented to the emergency room for evaluation of syncope episodes.  Status post cardiac cath which showed increased pulmonary hypertension.  Normal coronary artery disease.  VQ scan showed high probability of PE.  1. Iron deficiency anemia due to chronic blood loss   2. History of pulmonary embolism   3. Vitamin B12 deficiency   4. H/O bariatric surgery     #Pulmonary embolism, Morbid obesity may also contribute.  Gastric sleeve surgery prior to presentation may have triggered the event. She has completed 6 months of anticoagulation. Recommend to decrease to maintenance dose of Eliquis 2.5 mg twice daily.  Long-term anticoagulation. Prescription sent to pharmacy.  #Iron deficiency anemia, labs reviewed and discussed with patient.  No need for IV iron. #Vitamin B12 deficiency, patient reports that she has been using bariatric transdermal patch which contains B12. Discussed with her that transdermal patch may not be as effective as oral or parenteral treatment. Recommend starting parenteral B12 injections weekly x4 followed by monthly. Recommend patient to discuss with bariatric surgeon about the effectiveness of transdermal patch. She may need to be switched back to oral vitamin supplementation.  #History of bariatric surgery/gastric sleeve.  At the risk of vitamin deficiency.  Will monitor her counts periodically. We spent sufficient time to discuss many aspect of care, questions were answered to patient's satisfaction.  Orders Placed This Encounter  Procedures  . CBC with Differential/Platelet    Standing Status:   Future    Standing Expiration Date:   09/12/2019  . Ferritin    Standing Status:   Future    Standing Expiration Date:   09/12/2019  . Iron  and TIBC    Standing Status:   Future    Standing Expiration Date:   09/12/2019  . Vitamin B12    Standing Status:   Future    Standing Expiration Date:   03/15/2019  . Folate    Standing Status:   Future    Standing Expiration Date:   09/12/2019  . Comprehensive metabolic panel    Standing Status:   Future    Standing Expiration Date:   09/12/2019     RTC in 3 months.  We spent sufficient time to discuss many aspect of care, questions were answered to patient's satisfaction. Total face to face encounter time for this patient visit was 25 min. >50% of the time was  spent in counseling and coordination of care.    Earlie Server, MD, PhD Hematology Oncology [place] 09/13/2018

## 2018-09-14 ENCOUNTER — Other Ambulatory Visit: Payer: Self-pay

## 2018-09-17 ENCOUNTER — Inpatient Hospital Stay: Payer: BC Managed Care – PPO | Attending: Oncology

## 2018-09-17 ENCOUNTER — Other Ambulatory Visit: Payer: Self-pay

## 2018-09-17 DIAGNOSIS — I2699 Other pulmonary embolism without acute cor pulmonale: Secondary | ICD-10-CM | POA: Diagnosis present

## 2018-09-17 DIAGNOSIS — M1711 Unilateral primary osteoarthritis, right knee: Secondary | ICD-10-CM | POA: Diagnosis not present

## 2018-09-17 DIAGNOSIS — Z833 Family history of diabetes mellitus: Secondary | ICD-10-CM | POA: Diagnosis not present

## 2018-09-17 DIAGNOSIS — I10 Essential (primary) hypertension: Secondary | ICD-10-CM | POA: Diagnosis not present

## 2018-09-17 DIAGNOSIS — D500 Iron deficiency anemia secondary to blood loss (chronic): Secondary | ICD-10-CM | POA: Diagnosis present

## 2018-09-17 DIAGNOSIS — L658 Other specified nonscarring hair loss: Secondary | ICD-10-CM | POA: Insufficient documentation

## 2018-09-17 DIAGNOSIS — E119 Type 2 diabetes mellitus without complications: Secondary | ICD-10-CM | POA: Insufficient documentation

## 2018-09-17 DIAGNOSIS — E538 Deficiency of other specified B group vitamins: Secondary | ICD-10-CM | POA: Insufficient documentation

## 2018-09-17 DIAGNOSIS — Z79899 Other long term (current) drug therapy: Secondary | ICD-10-CM | POA: Insufficient documentation

## 2018-09-17 DIAGNOSIS — Z825 Family history of asthma and other chronic lower respiratory diseases: Secondary | ICD-10-CM | POA: Insufficient documentation

## 2018-09-17 DIAGNOSIS — Z9884 Bariatric surgery status: Secondary | ICD-10-CM | POA: Diagnosis not present

## 2018-09-17 DIAGNOSIS — Z7901 Long term (current) use of anticoagulants: Secondary | ICD-10-CM | POA: Insufficient documentation

## 2018-09-17 DIAGNOSIS — Z8249 Family history of ischemic heart disease and other diseases of the circulatory system: Secondary | ICD-10-CM | POA: Insufficient documentation

## 2018-09-17 DIAGNOSIS — Z823 Family history of stroke: Secondary | ICD-10-CM | POA: Insufficient documentation

## 2018-09-17 MED ORDER — CYANOCOBALAMIN 1000 MCG/ML IJ SOLN
1000.0000 ug | Freq: Once | INTRAMUSCULAR | Status: AC
  Administered 2018-09-17: 1000 ug via INTRAMUSCULAR

## 2018-09-21 ENCOUNTER — Other Ambulatory Visit: Payer: Self-pay

## 2018-09-24 ENCOUNTER — Inpatient Hospital Stay: Payer: BC Managed Care – PPO

## 2018-09-24 ENCOUNTER — Other Ambulatory Visit: Payer: Self-pay

## 2018-09-24 DIAGNOSIS — D500 Iron deficiency anemia secondary to blood loss (chronic): Secondary | ICD-10-CM | POA: Diagnosis not present

## 2018-09-24 MED ORDER — CYANOCOBALAMIN 1000 MCG/ML IJ SOLN
1000.0000 ug | Freq: Once | INTRAMUSCULAR | Status: AC
  Administered 2018-09-24: 1000 ug via INTRAMUSCULAR
  Filled 2018-09-24: qty 1

## 2018-09-28 ENCOUNTER — Other Ambulatory Visit: Payer: Self-pay

## 2018-10-01 ENCOUNTER — Other Ambulatory Visit: Payer: Self-pay

## 2018-10-01 ENCOUNTER — Inpatient Hospital Stay: Payer: BC Managed Care – PPO

## 2018-10-01 DIAGNOSIS — D500 Iron deficiency anemia secondary to blood loss (chronic): Secondary | ICD-10-CM

## 2018-10-01 MED ORDER — CYANOCOBALAMIN 1000 MCG/ML IJ SOLN
1000.0000 ug | Freq: Once | INTRAMUSCULAR | Status: AC
  Administered 2018-10-01: 1000 ug via INTRAMUSCULAR
  Filled 2018-10-01: qty 1

## 2018-10-08 ENCOUNTER — Inpatient Hospital Stay: Payer: BC Managed Care – PPO

## 2018-10-09 ENCOUNTER — Other Ambulatory Visit: Payer: Self-pay

## 2018-10-10 ENCOUNTER — Inpatient Hospital Stay: Payer: BC Managed Care – PPO

## 2018-10-10 ENCOUNTER — Other Ambulatory Visit: Payer: Self-pay

## 2018-10-10 DIAGNOSIS — D500 Iron deficiency anemia secondary to blood loss (chronic): Secondary | ICD-10-CM | POA: Diagnosis not present

## 2018-10-10 MED ORDER — CYANOCOBALAMIN 1000 MCG/ML IJ SOLN
1000.0000 ug | Freq: Once | INTRAMUSCULAR | Status: AC
  Administered 2018-10-10: 1000 ug via INTRAMUSCULAR
  Filled 2018-10-10: qty 1

## 2018-10-16 ENCOUNTER — Ambulatory Visit: Payer: BLUE CROSS/BLUE SHIELD | Admitting: Nurse Practitioner

## 2018-10-18 ENCOUNTER — Ambulatory Visit: Payer: BLUE CROSS/BLUE SHIELD | Admitting: Nurse Practitioner

## 2018-10-24 ENCOUNTER — Encounter: Payer: Self-pay | Admitting: Nurse Practitioner

## 2018-10-24 ENCOUNTER — Other Ambulatory Visit: Payer: Self-pay

## 2018-10-24 ENCOUNTER — Ambulatory Visit (INDEPENDENT_AMBULATORY_CARE_PROVIDER_SITE_OTHER): Payer: BC Managed Care – PPO | Admitting: Nurse Practitioner

## 2018-10-24 VITALS — BP 136/80 | HR 72 | Temp 98.1°F | Ht 64.6 in | Wt 280.6 lb

## 2018-10-24 DIAGNOSIS — I10 Essential (primary) hypertension: Secondary | ICD-10-CM | POA: Diagnosis not present

## 2018-10-24 DIAGNOSIS — Z23 Encounter for immunization: Secondary | ICD-10-CM

## 2018-10-24 DIAGNOSIS — E119 Type 2 diabetes mellitus without complications: Secondary | ICD-10-CM

## 2018-10-24 NOTE — Progress Notes (Signed)
Subjective:     Patient ID: Kristen [name] , female    DOB: Dec 02, 1965 , 53 y.o.   MRN: [id number]   Chief Complaint  Patient presents with  . Diabetes    HPI  Wt Readings from Last 3 Encounters: 10/24/18 : 280 lb 9.6 oz (127.3 kg) 09/12/18 : 281 lb 4.8 oz (127.6 kg) 08/16/18 : 279 lb 4.8 oz (126.7 kg)  She continues with the bariatric center regularly. She has been released from cardiology She continues to be seen by Nephrology and Hematology (Eliquis decreased to 2.'5mg'$  twice a day) Vitamin B12 was low did 4 weeks vitamin B12 and once a month.  She is planning to see if she have injectable cartilage to her left knee.  Walking makes her knees hurt.  She is doing hinge program with her knees.   Diabetes She presents for her follow-up diabetic visit. She has type 2 diabetes mellitus. Her disease course has been stable. Pertinent negatives for hypoglycemia include no confusion, dizziness, headaches or hunger. There are no diabetic associated symptoms. Pertinent negatives for diabetes include no chest pain, no fatigue, no polydipsia, no polyphagia and no polyuria. There are no hypoglycemic complications. Symptoms are stable. Current diabetic treatments: no metformin. She is compliant with treatment most of the time. (103-115)     Past Medical History:  Diagnosis Date  . Arthritis   . Diabetes ([place])    Type 2  . Hypertension   . Iron deficiency anemia 03/07/2018  . URI (upper respiratory infection)    took antibiotic and prednisone oct 2019 resolved  . Uses contact lenses      Family History  Problem Relation Age of Onset  . Hypertension Mother   . Diabetes Mother   . Hypertension Father   . Diabetes Father   . COPD Father   . Stroke Father   . Congestive Heart Failure Father   . Diabetes Sister   . Hypertension Sister      Current Outpatient Medications:  .  acetaminophen (TYLENOL) 500 MG tablet, Take 500 mg by mouth every 6 (six) hours as needed., Disp: , Rfl:   .  apixaban (ELIQUIS) 2.5 MG TABS tablet, Take 1 tablet (2.5 mg total) by mouth 2 (two) times daily., Disp: 60 tablet, Rfl: 3 .  cetirizine (ZYRTEC) 10 MG tablet, Take 10 mg by mouth daily., Disp: , Rfl:  .  docusate sodium (COLACE) 100 MG capsule, Take 100 mg by mouth 2 (two) times daily., Disp: , Rfl:  .  FLUoxetine (PROZAC) 20 MG tablet, TAKE 1 TABLET BY MOUTH EVERY DAY, Disp: 90 tablet, Rfl: 1 .  Multiple Vitamins-Minerals (BARIATRIC MULTIVITAMINS/IRON PO), Take 1 tablet by mouth 2 (two) times daily. Patient using bariatric transdermal patch with calcium B12 and iron, Disp: , Rfl:  .  NONFORMULARY OR COMPOUNDED ITEM, Iron patch apply every 8 hours, Disp: , Rfl:  .  olmesartan (BENICAR) 20 MG tablet, Take 40 mg by mouth daily. Pt is taking '40mg'$ , Disp: , Rfl:  .  albuterol (PROVENTIL HFA;VENTOLIN HFA) 108 (90 Base) MCG/ACT inhaler, Inhale 1-2 puffs into the lungs every 6 (six) hours as needed for wheezing or shortness of breath. (Patient not taking: Reported on 10/24/2018), Disp: 1 Inhaler, Rfl: 2 .  blood glucose meter kit and supplies KIT, Dispense based on patient and insurance preference. Use up to four times daily as directed. (FOR ICD-9 250.00, 250.01). (Patient not taking: Reported on 10/24/2018), Disp: 1 each, Rfl: 0   Allergies  Allergen  Reactions  . Amoxicillin Hives    Has patient had a PCN reaction causing immediate rash, facial/tongue/throat swelling, SOB or lightheadedness with hypotension: No Has patient had a PCN reaction causing severe rash involving mucus membranes or skin necrosis: Yes Has patient had a PCN reaction that required hospitalization No Has patient had a PCN reaction occurring within the last 10 years: No If all of the above answers are "NO", then may proceed with Cephalosporin use.   . Fish Allergy Hives     Review of Systems  Constitutional: Negative for fatigue.  Respiratory: Negative.   Cardiovascular: Negative.  Negative for chest pain, palpitations and  leg swelling.  Endocrine: Negative for polydipsia, polyphagia and polyuria.  Neurological: Negative for dizziness and headaches.  Psychiatric/Behavioral: Negative for confusion.     Today's Vitals   10/24/18 1644  BP: 136/80  Pulse: 72  Temp: 98.1 F (36.7 C)  TempSrc: Oral  Weight: 280 lb 9.6 oz (127.3 kg)  Height: 5' 4.6" (1.641 m)  PainSc: 0-No pain   Body mass index is 47.27 kg/m.   Objective:  Physical Exam Constitutional:      General: She is not in acute distress.    Appearance: Normal appearance. She is obese.  Cardiovascular:     Rate and Rhythm: Normal rate and regular rhythm.     Pulses: Normal pulses.     Heart sounds: Normal heart sounds. No murmur.  Pulmonary:     Effort: Pulmonary effort is normal.     Breath sounds: Normal breath sounds.  Skin:    General: Skin is warm and dry.     Capillary Refill: Capillary refill takes less than 2 seconds.  Neurological:     General: No focal deficit present.     Mental Status: She is alert and oriented to person, place, and time.  Psychiatric:        Mood and Affect: Mood normal.        Behavior: Behavior normal.        Thought Content: Thought content normal.        Judgment: Judgment normal.         Assessment And Plan:     1. Type 2 diabetes mellitus without complication, without long-term current use of insulin ([place])  Chronic  She is not currently taking any medication was discontinued by the bariatric surgeon  2. Essential hypertension  Chronic  Fairly contolled  Continue to increase physical activity as tolerated.  3. Need for influenza vaccination  Influenza vaccine given in office  Advised to take Tylenol as needed for muscle aches or fever - Flu Vaccine QUAD 6+ mos PF IM (Fluarix Quad PF)      [name], FNP    THE PATIENT IS ENCOURAGED TO PRACTICE SOCIAL DISTANCING DUE TO THE COVID-19 PANDEMIC.

## 2018-10-24 NOTE — Patient Instructions (Signed)
Influenza Virus Vaccine (Flucelvax) What is this medicine? INFLUENZA VIRUS VACCINE (in floo EN zuh VAHY ruhs vak SEEN) helps to reduce the risk of getting influenza also known as the flu. The vaccine only helps protect you against some strains of the flu. This medicine may be used for other purposes; ask your health care provider or pharmacist if you have questions. COMMON BRAND NAME(S): FLUCELVAX What should I tell my health care provider before I take this medicine? They need to know if you have any of these conditions:  bleeding disorder like hemophilia  fever or infection  Guillain-Barre syndrome or other neurological problems  immune system problems  infection with the human immunodeficiency virus (HIV) or AIDS  low blood platelet counts  multiple sclerosis  an unusual or allergic reaction to influenza virus vaccine, other medicines, foods, dyes or preservatives  pregnant or trying to get pregnant  breast-feeding How should I use this medicine? This vaccine is for injection into a muscle. It is given by a health care professional. A copy of Vaccine Information Statements will be given before each vaccination. Read this sheet carefully each time. The sheet may change frequently. Talk to your pediatrician regarding the use of this medicine in children. Special care may be needed. Overdosage: If you think you've taken too much of this medicine contact a poison control center or emergency room at once. Overdosage: If you think you have taken too much of this medicine contact a poison control center or emergency room at once. NOTE: This medicine is only for you. Do not share this medicine with others. What if I miss a dose? This does not apply. What may interact with this medicine?  chemotherapy or radiation therapy  medicines that lower your immune system like etanercept, anakinra, infliximab, and adalimumab  medicines that treat or prevent blood clots like  warfarin  phenytoin  steroid medicines like prednisone or cortisone  theophylline  vaccines This list may not describe all possible interactions. Give your health care provider a list of all the medicines, herbs, non-prescription drugs, or dietary supplements you use. Also tell them if you smoke, drink alcohol, or use illegal drugs. Some items may interact with your medicine. What should I watch for while using this medicine? Report any side effects that do not go away within 3 days to your doctor or health care professional. Call your health care provider if any unusual symptoms occur within 6 weeks of receiving this vaccine. You may still catch the flu, but the illness is not usually as bad. You cannot get the flu from the vaccine. The vaccine will not protect against colds or other illnesses that may cause fever. The vaccine is needed every year. What side effects may I notice from receiving this medicine? Side effects that you should report to your doctor or health care professional as soon as possible:  allergic reactions like skin rash, itching or hives, swelling of the face, lips, or tongue Side effects that usually do not require medical attention (Report these to your doctor or health care professional if they continue or are bothersome.):  fever  headache  muscle aches and pains  pain, tenderness, redness, or swelling at the injection site  tiredness This list may not describe all possible side effects. Call your doctor for medical advice about side effects. You may report side effects to FDA at 1-800-FDA-1088. Where should I keep my medicine? The vaccine will be given by a health care professional in a clinic, pharmacy, doctor's   office, or other health care setting. You will not be given vaccine doses to store at home. NOTE: This sheet is a summary. It may not cover all possible information. If you have questions about this medicine, talk to your doctor, pharmacist, or  health care provider.  2020 Elsevier/Gold Standard (2011-01-12 14:06:47)  

## 2018-10-25 LAB — LIPID PANEL
Chol/HDL Ratio: 3.3 ratio (ref 0.0–4.4)
Cholesterol, Total: 157 mg/dL (ref 100–199)
HDL: 48 mg/dL (ref >39)
LDL Chol Calc (NIH): 86 mg/dL (ref 0–99)
Triglycerides: 130 mg/dL (ref 0–149)
VLDL Cholesterol Cal: 23 mg/dL (ref 5–40)

## 2018-10-25 LAB — BMP8+EGFR
BUN/Creatinine Ratio: 15 (ref 9–23)
BUN: 16 mg/dL (ref 6–24)
CO2: 23 mmol/L (ref 20–29)
Calcium: 9.2 mg/dL (ref 8.7–10.2)
Chloride: 110 mmol/L — ABNORMAL HIGH (ref 96–106)
Creatinine, Ser: 1.1 mg/dL — ABNORMAL HIGH (ref 0.57–1.00)
GFR calc Af Amer: 66 mL/min/{1.73_m2} (ref >59)
GFR calc non Af Amer: 57 mL/min/{1.73_m2} — ABNORMAL LOW (ref >59)
Glucose: 96 mg/dL (ref 65–99)
Potassium: 4.7 mmol/L (ref 3.5–5.2)
Sodium: 146 mmol/L — ABNORMAL HIGH (ref 134–144)

## 2018-10-25 LAB — HEMOGLOBIN A1C
Est. average glucose Bld gHb Est-mCnc: 131 mg/dL
Hgb A1c MFr Bld: 6.2 % — ABNORMAL HIGH (ref 4.8–5.6)

## 2018-11-12 ENCOUNTER — Inpatient Hospital Stay: Payer: BC Managed Care – PPO | Attending: Oncology

## 2018-11-12 ENCOUNTER — Other Ambulatory Visit: Payer: Self-pay

## 2018-11-12 DIAGNOSIS — E538 Deficiency of other specified B group vitamins: Secondary | ICD-10-CM | POA: Diagnosis not present

## 2018-11-12 DIAGNOSIS — D509 Iron deficiency anemia, unspecified: Secondary | ICD-10-CM | POA: Diagnosis present

## 2018-11-12 DIAGNOSIS — Z836 Family history of other diseases of the respiratory system: Secondary | ICD-10-CM | POA: Diagnosis not present

## 2018-11-12 DIAGNOSIS — Z823 Family history of stroke: Secondary | ICD-10-CM | POA: Insufficient documentation

## 2018-11-12 DIAGNOSIS — D500 Iron deficiency anemia secondary to blood loss (chronic): Secondary | ICD-10-CM

## 2018-11-12 DIAGNOSIS — Z833 Family history of diabetes mellitus: Secondary | ICD-10-CM | POA: Insufficient documentation

## 2018-11-12 DIAGNOSIS — E669 Obesity, unspecified: Secondary | ICD-10-CM | POA: Insufficient documentation

## 2018-11-12 DIAGNOSIS — Z8249 Family history of ischemic heart disease and other diseases of the circulatory system: Secondary | ICD-10-CM | POA: Diagnosis not present

## 2018-11-12 DIAGNOSIS — Z9884 Bariatric surgery status: Secondary | ICD-10-CM | POA: Insufficient documentation

## 2018-11-12 DIAGNOSIS — Z86711 Personal history of pulmonary embolism: Secondary | ICD-10-CM | POA: Diagnosis not present

## 2018-11-12 DIAGNOSIS — Z7901 Long term (current) use of anticoagulants: Secondary | ICD-10-CM | POA: Diagnosis not present

## 2018-11-12 LAB — HM DIABETES EYE EXAM

## 2018-11-12 MED ORDER — CYANOCOBALAMIN 1000 MCG/ML IJ SOLN
1000.0000 ug | Freq: Once | INTRAMUSCULAR | Status: AC
  Administered 2018-11-12: 1000 ug via INTRAMUSCULAR

## 2018-11-13 ENCOUNTER — Encounter: Payer: Self-pay | Admitting: Obstetrics and Gynecology

## 2018-11-13 ENCOUNTER — Other Ambulatory Visit (HOSPITAL_COMMUNITY)
Admission: RE | Admit: 2018-11-13 | Discharge: 2018-11-13 | Disposition: A | Discharge status: Home / Self Care | Payer: BC Managed Care – PPO | Source: Ambulatory Visit | Attending: Obstetrics and Gynecology | Admitting: Obstetrics and Gynecology

## 2018-11-13 ENCOUNTER — Ambulatory Visit (INDEPENDENT_AMBULATORY_CARE_PROVIDER_SITE_OTHER): Payer: BC Managed Care – PPO | Admitting: Obstetrics and Gynecology

## 2018-11-13 VITALS — BP 150/94 | Ht 64.5 in | Wt 280.0 lb

## 2018-11-13 DIAGNOSIS — Z1151 Encounter for screening for human papillomavirus (HPV): Secondary | ICD-10-CM | POA: Insufficient documentation

## 2018-11-13 DIAGNOSIS — Z124 Encounter for screening for malignant neoplasm of cervix: Secondary | ICD-10-CM

## 2018-11-13 DIAGNOSIS — Z01419 Encounter for gynecological examination (general) (routine) without abnormal findings: Secondary | ICD-10-CM | POA: Diagnosis not present

## 2018-11-13 DIAGNOSIS — Z1239 Encounter for other screening for malignant neoplasm of breast: Secondary | ICD-10-CM

## 2018-11-13 NOTE — Progress Notes (Signed)
PCP: Glendale Chard, MD   Chief Complaint  Patient presents with  . Gynecologic Exam    HPI:      Ms. Kristen [name] is a 53 y.o. No obstetric history on file. who LMP was Patient's last menstrual period was 10/17/2018 (exact date)., presents today for her NP annual examination.  Her menses are usually monthly, lasting 1-3 days, light flow.  Dysmenorrhea none. She does not have intermenstrual bleeding. Missed 2 periods in a row this year but the subsequent period was heavier and with cramping. No AUB sx. She does have vasomotor sx.   Sex activity: single partner, She does not have vaginal dryness.  Last Pap: not recent; no hx of abn paps, no STDs.  Last mammogram: March 22, 2017  Results were: normal--routine follow-up in 12 months. Has appt 10/20. There is no FH of breast cancer. There is no FH of ovarian cancer. The patient does do self-breast exams.  Colonoscopy: 2018  Repeat due after 10 years.   Tobacco use: The patient denies current or previous tobacco use. Alcohol use: social drinker Exercise: moderately active  She does get adequate calcium and Vitamin D in her diet.  Labs with PCP.   Past Medical History:  Diagnosis Date  . Arthritis   . Diabetes ([place])    Type 2  . Hypertension   . Iron deficiency anemia 03/07/2018  . URI (upper respiratory infection)    took antibiotic and prednisone oct 2019 resolved  . Uses contact lenses     Past Surgical History:  Procedure Laterality Date  . COLONOSCOPY WITH PROPOFOL N/A 03/18/2016   Procedure: COLONOSCOPY WITH PROPOFOL;  Surgeon: Carol Ada, MD;  Location: [facility];  Service: Endoscopy;  Laterality: N/A;  . LAPAROSCOPIC GASTRIC SLEEVE RESECTION N/A 01/29/2018   Procedure: LAPAROSCOPIC GASTRIC SLEEVE RESECTION WITH UPPER ENDO AND ERAS PATHWAY;  Surgeon: Kinsinger, Arta Bruce, MD;  Location: [facility];  Service: General;  Laterality: N/A;  . RIGHT/LEFT HEART CATH AND CORONARY ANGIOGRAPHY N/A 02/15/2018   Procedure: RIGHT/LEFT HEART CATH AND CORONARY ANGIOGRAPHY;  Surgeon: Corey Skains, MD;  Location: [facility];  Service: Cardiovascular;  Laterality: N/A;  . TOTAL HIP ARTHROPLASTY Right 08/04/2017   Procedure: RIGHT TOTAL HIP ARTHROPLASTY ANTERIOR APPROACH;  Surgeon: Mcarthur Rossetti, MD;  Location: [facility];  Service: Orthopedics;  Laterality: Right;    Family History  Problem Relation Age of Onset  . Hypertension Mother   . Diabetes Mother   . Hypertension Father   . Diabetes Father   . COPD Father   . Stroke Father   . Congestive Heart Failure Father   . Diabetes Sister   . Hypertension Sister     Social History   Socioeconomic History  . Marital status: Married    Spouse name: Not on file  . Number of children: Not on file  . Years of education: Not on file  . Highest education level: Not on file  Occupational History  . Not on file  Social Needs  . Financial resource strain: Not on file  . Food insecurity    Worry: Not on file    Inability: Not on file  . Transportation needs    Medical: Not on file    Non-medical: Not on file  Tobacco Use  . Smoking status: Never Smoker  . Smokeless tobacco: Never Used  Substance and Sexual Activity  . Alcohol use: Not Currently    Comment: OCCASIONAL   . Drug use: No  .  Sexual activity: Yes    Birth control/protection: None  Lifestyle  . Physical activity    Days per week: Not on file    Minutes per session: Not on file  . Stress: Not on file  Relationships  . Social Herbalist on phone: Not on file    Gets together: Not on file    Attends religious service: Not on file    Active member of club or organization: Not on file    Attends meetings of clubs or organizations: Not on file    Relationship status: Not on file  . Intimate partner violence    Fear of current or ex partner: Not on file    Emotionally abused: Not on file    Physically abused: Not on file    Forced sexual activity:  Not on file  Other Topics Concern  . Not on file  Social History Narrative  . Not on file     Current Outpatient Medications:  .  acetaminophen (TYLENOL) 500 MG tablet, Take 500 mg by mouth every 6 (six) hours as needed., Disp: , Rfl:  .  albuterol (PROVENTIL HFA;VENTOLIN HFA) 108 (90 Base) MCG/ACT inhaler, Inhale 1-2 puffs into the lungs every 6 (six) hours as needed for wheezing or shortness of breath., Disp: 1 Inhaler, Rfl: 2 .  apixaban (ELIQUIS) 2.5 MG TABS tablet, Take 1 tablet (2.5 mg total) by mouth 2 (two) times daily., Disp: 60 tablet, Rfl: 3 .  blood glucose meter kit and supplies KIT, Dispense based on patient and insurance preference. Use up to four times daily as directed. (FOR ICD-9 250.00, 250.01)., Disp: 1 each, Rfl: 0 .  cetirizine (ZYRTEC) 10 MG tablet, Take 10 mg by mouth daily., Disp: , Rfl:  .  docusate sodium (COLACE) 100 MG capsule, Take 100 mg by mouth 2 (two) times daily., Disp: , Rfl:  .  FLUoxetine (PROZAC) 20 MG tablet, TAKE 1 TABLET BY MOUTH EVERY DAY, Disp: 90 tablet, Rfl: 1 .  Multiple Vitamins-Minerals (BARIATRIC MULTIVITAMINS/IRON PO), Take 1 tablet by mouth 2 (two) times daily. Patient using bariatric transdermal patch with calcium B12 and iron, Disp: , Rfl:  .  NONFORMULARY OR COMPOUNDED ITEM, Iron patch apply every 8 hours, Disp: , Rfl:  .  olmesartan (BENICAR) 20 MG tablet, Take 40 mg by mouth daily. Pt is taking 9m, Disp: , Rfl:      ROS:  Review of Systems  Constitutional: Negative for fatigue, fever and unexpected weight change.  Respiratory: Negative for cough, shortness of breath and wheezing.   Cardiovascular: Negative for chest pain, palpitations and leg swelling.  Gastrointestinal: Positive for constipation. Negative for blood in stool, diarrhea, nausea and vomiting.  Endocrine: Negative for cold intolerance, heat intolerance and polyuria.  Genitourinary: Negative for dyspareunia, dysuria, flank pain, frequency, genital sores, hematuria,  menstrual problem, pelvic pain, urgency, vaginal bleeding, vaginal discharge and vaginal pain.  Musculoskeletal: Positive for arthralgias. Negative for back pain, joint swelling and myalgias.  Skin: Negative for rash.  Neurological: Negative for dizziness, syncope, light-headedness, numbness and headaches.  Hematological: Negative for adenopathy.  Psychiatric/Behavioral: Negative for agitation, confusion, sleep disturbance and suicidal ideas. The patient is not nervous/anxious.   BREAST: No symptoms    Objective: BP (!) 150/94   Ht 5' 4.5" (1.638 m)   Wt 280 lb (127 kg)   LMP 10/17/2018 (Exact Date)   BMI 47.32 kg/m    Physical Exam Constitutional:      Appearance: She is well-developed.  Genitourinary:     Vulva, vagina, cervix, uterus, right adnexa and left adnexa normal.     No vulval lesion or tenderness noted.     No vaginal discharge, erythema or tenderness.     No cervical polyp.     Uterus is not enlarged or tender.     No right or left adnexal mass present.     Right adnexa not tender.     Left adnexa not tender.  Neck:     Musculoskeletal: Normal range of motion.     Thyroid: No thyromegaly.  Cardiovascular:     Rate and Rhythm: Normal rate and regular rhythm.     Heart sounds: Normal heart sounds. No murmur.  Pulmonary:     Effort: Pulmonary effort is normal.     Breath sounds: Normal breath sounds.  Chest:     Breasts:        Right: No mass, nipple discharge, skin change or tenderness.        Left: No mass, nipple discharge, skin change or tenderness.  Abdominal:     Palpations: Abdomen is soft.     Tenderness: There is no abdominal tenderness. There is no guarding.  Musculoskeletal: Normal range of motion.  Neurological:     General: No focal deficit present.     Mental Status: She is alert and oriented to person, place, and time.     Cranial Nerves: No cranial nerve deficit.  Skin:    General: Skin is warm and dry.  Psychiatric:        Mood and  Affect: Mood normal.        Behavior: Behavior normal.        Thought Content: Thought content normal.        Judgment: Judgment normal.  Vitals signs reviewed.     Assessment/Plan:  Encounter for annual routine gynecological examination  Cervical cancer screening - Plan: Cytology - PAP  Screening for HPV (human papillomavirus) - Plan: Cytology - PAP  Screening for breast cancer--pt has appt sched.  Perimenopause--f/u prn DUB          GYN counsel breast self exam, mammography screening, menopause, adequate intake of calcium and vitamin D, diet and exercise    F/U  Return in about 1 year (around 11/13/2019).  Saraiah Bhat B. Emmory Solivan, PA-C 11/13/2018 2:14 PM

## 2018-11-13 NOTE — Patient Instructions (Signed)
I value your feedback and entrusting us with your care. If you get a  patient survey, I would appreciate you taking the time to let us know about your experience today. Thank you! 

## 2018-11-15 ENCOUNTER — Encounter: Payer: BC Managed Care – PPO | Attending: General Surgery | Admitting: Dietician

## 2018-11-15 ENCOUNTER — Encounter: Payer: Self-pay | Admitting: Dietician

## 2018-11-15 ENCOUNTER — Other Ambulatory Visit: Payer: Self-pay

## 2018-11-15 VITALS — Ht 65.0 in | Wt 278.7 lb

## 2018-11-15 DIAGNOSIS — Z6841 Body Mass Index (BMI) 40.0 and over, adult: Secondary | ICD-10-CM

## 2018-11-15 NOTE — Patient Instructions (Signed)
   Continue with current eating pattern with healthy proteins, low-carb vegetables, some fruits and only small amounts of starches.   Keep increasing physical activity as able to maintain muscle and reduce body fat/ size.

## 2018-11-15 NOTE — Progress Notes (Signed)
Follow-up visit:  9 MonthsPost-Operative Sleeve Gastrectomy Surgery  Medical Nutrition Therapy:  Appt start time: [id number] end time:  [id number].  Primary concerns today: Post-operative Bariatric Surgery Nutrition Management.   Learning Readiness:   Change in progress  Weight: 278.7lbs Height: 5'5" Weight at previous NDES visit: 279.3lbs    Progress:  Patient states she is now the smallest size in the past 20 years.   Weight loss of 0.6lbs since previous visit on 08/16/18; patient was on vacation last week so likely ate slightly more, also states her menstrual cycle will be starting soon.   Started program at work called Move more, eat less encouraging increased daily activity.   Often shares meals with husband, limits unhealthy foods in her home for grandsons  Has started [place] program  Dietary recall: Breakfast: yogurt with small amount bran flakes; 1 egg + sausage links or 3 strips bacon Snack:   Lunch: usually not hungry -- leftovers, veg, cheese Snack: cheese, veg, hummus   Dinner: meat + veg, sometimes small portion starch. Has tried bread but did not settle well.  Snack: 9/30 orange  Fluid intake: 64+oz daily -- water, coffee some days; had 1 glass wine Estimated total protein intake: 60-65g; monitors intake with Lose It app  Medications: acetaminophen prn, albuterol inhaler prn, apixaban, cetirizine, docusate sodium, FLUoxetine, olmesartan, triamcinolone cream Supplementation: bariatric multivitamin patch, iron infusions, B12 injections recently, calcium  Using straws: no Drinking while eating: no Hair loss: alopecia Carbonated beverages: no N/V/D/C: none Dumping syndrome: no   Recent physical activity:  Walking, increased ADLs; checking into new treatment for knee pain; no gym visits due to increased risk for COVID.   Progress Towards Goal(s):  In progress.  Handouts given during visit include:  Goals and Instructions (AVS)   Nutritional Diagnosis:  [place]-3.3  Overweight/obesity As related to history of excess calories and inactivity.  As evidenced by patient following bariatric diet guidelines for ongoing weight loss after sleeve gastrectomy surgery.    Intervention:    Reviewed progress since previous visit.  Discussed non-food factors that could influence weight loss.   Patient has been working diligently on healthy lifestyle changes and choices, and is pleased with her progress.    Teaching Method Utilized:  Visual Auditory Hands on  Barriers to learning/adherence to lifestyle change: none  Demonstrated degree of understanding via:  Teach Back   Monitoring/Evaluation:  Dietary intake, exercise, and body weight. Follow up on 02/18/19 for 12 month post-op visit.

## 2018-11-19 ENCOUNTER — Ambulatory Visit: Payer: BC Managed Care – PPO

## 2018-11-19 LAB — CYTOLOGY - PAP
Diagnosis: NEGATIVE
High risk HPV: NEGATIVE

## 2018-11-20 ENCOUNTER — Ambulatory Visit
Admission: RE | Admit: 2018-11-20 | Discharge: 2018-11-20 | Disposition: A | Discharge status: Home / Self Care | Payer: BC Managed Care – PPO | Source: Ambulatory Visit | Attending: Nurse Practitioner | Admitting: Nurse Practitioner

## 2018-11-20 ENCOUNTER — Other Ambulatory Visit: Payer: Self-pay

## 2018-11-20 DIAGNOSIS — Z1231 Encounter for screening mammogram for malignant neoplasm of breast: Secondary | ICD-10-CM

## 2018-11-30 ENCOUNTER — Inpatient Hospital Stay: Payer: BC Managed Care – PPO | Attending: Oncology

## 2018-11-30 ENCOUNTER — Other Ambulatory Visit: Payer: Self-pay

## 2018-11-30 DIAGNOSIS — D500 Iron deficiency anemia secondary to blood loss (chronic): Secondary | ICD-10-CM | POA: Insufficient documentation

## 2018-11-30 DIAGNOSIS — I10 Essential (primary) hypertension: Secondary | ICD-10-CM | POA: Diagnosis not present

## 2018-11-30 DIAGNOSIS — I2699 Other pulmonary embolism without acute cor pulmonale: Secondary | ICD-10-CM | POA: Insufficient documentation

## 2018-11-30 DIAGNOSIS — Z833 Family history of diabetes mellitus: Secondary | ICD-10-CM | POA: Insufficient documentation

## 2018-11-30 DIAGNOSIS — E538 Deficiency of other specified B group vitamins: Secondary | ICD-10-CM | POA: Diagnosis not present

## 2018-11-30 DIAGNOSIS — Z823 Family history of stroke: Secondary | ICD-10-CM | POA: Diagnosis not present

## 2018-11-30 DIAGNOSIS — Z9884 Bariatric surgery status: Secondary | ICD-10-CM | POA: Diagnosis not present

## 2018-11-30 DIAGNOSIS — Z836 Family history of other diseases of the respiratory system: Secondary | ICD-10-CM | POA: Insufficient documentation

## 2018-11-30 DIAGNOSIS — R42 Dizziness and giddiness: Secondary | ICD-10-CM | POA: Insufficient documentation

## 2018-11-30 DIAGNOSIS — Z7901 Long term (current) use of anticoagulants: Secondary | ICD-10-CM | POA: Insufficient documentation

## 2018-11-30 DIAGNOSIS — E119 Type 2 diabetes mellitus without complications: Secondary | ICD-10-CM | POA: Diagnosis not present

## 2018-11-30 DIAGNOSIS — Z79899 Other long term (current) drug therapy: Secondary | ICD-10-CM | POA: Insufficient documentation

## 2018-11-30 DIAGNOSIS — Z8249 Family history of ischemic heart disease and other diseases of the circulatory system: Secondary | ICD-10-CM | POA: Diagnosis not present

## 2018-11-30 DIAGNOSIS — M1990 Unspecified osteoarthritis, unspecified site: Secondary | ICD-10-CM | POA: Insufficient documentation

## 2018-11-30 DIAGNOSIS — R55 Syncope and collapse: Secondary | ICD-10-CM | POA: Diagnosis not present

## 2018-11-30 DIAGNOSIS — Z86711 Personal history of pulmonary embolism: Secondary | ICD-10-CM | POA: Diagnosis not present

## 2018-11-30 LAB — COMPREHENSIVE METABOLIC PANEL
ALT: 13 U/L (ref 0–44)
AST: 20 U/L (ref 15–41)
Albumin: 4.2 g/dL (ref 3.5–5.0)
Alkaline Phosphatase: 76 U/L (ref 38–126)
Anion gap: 7 (ref 5–15)
BUN: 18 mg/dL (ref 6–20)
CO2: 27 mmol/L (ref 22–32)
Calcium: 9.2 mg/dL (ref 8.9–10.3)
Chloride: 110 mmol/L (ref 98–111)
Creatinine, Ser: 1.01 mg/dL — ABNORMAL HIGH (ref 0.44–1.00)
GFR calc Af Amer: >60 mL/min (ref >60)
GFR calc non Af Amer: >60 mL/min (ref >60)
Glucose, Bld: 121 mg/dL — ABNORMAL HIGH (ref 70–99)
Potassium: 4.1 mmol/L (ref 3.5–5.1)
Sodium: 144 mmol/L (ref 135–145)
Total Bilirubin: 0.5 mg/dL (ref 0.3–1.2)
Total Protein: 8.3 g/dL — ABNORMAL HIGH (ref 6.5–8.1)

## 2018-11-30 LAB — CBC WITH DIFFERENTIAL/PLATELET
Abs Immature Granulocytes: 0.02 10*3/uL (ref 0.00–0.07)
Basophils Absolute: 0 10*3/uL (ref 0.0–0.1)
Basophils Relative: 0 %
Eosinophils Absolute: 0.1 10*3/uL (ref 0.0–0.5)
Eosinophils Relative: 2 %
HCT: 39.4 % (ref 36.0–46.0)
Hemoglobin: 12.4 g/dL (ref 12.0–15.0)
Immature Granulocytes: 0 %
Lymphocytes Relative: 46 %
Lymphs Abs: 2.5 10*3/uL (ref 0.7–4.0)
MCH: 28.6 pg (ref 26.0–34.0)
MCHC: 31.5 g/dL (ref 30.0–36.0)
MCV: 90.8 fL (ref 80.0–100.0)
Monocytes Absolute: 0.5 10*3/uL (ref 0.1–1.0)
Monocytes Relative: 9 %
Neutro Abs: 2.3 10*3/uL (ref 1.7–7.7)
Neutrophils Relative %: 43 %
Platelets: 368 10*3/uL (ref 150–400)
RBC: 4.34 MIL/uL (ref 3.87–5.11)
RDW: 13.5 % (ref 11.5–15.5)
WBC: 5.4 10*3/uL (ref 4.0–10.5)
nRBC: 0 % (ref 0.0–0.2)

## 2018-11-30 LAB — IRON AND TIBC
Iron: 62 ug/dL (ref 28–170)
Saturation Ratios: 18 % (ref 10.4–31.8)
TIBC: 339 ug/dL (ref 250–450)
UIBC: 277 ug/dL

## 2018-11-30 LAB — FERRITIN: Ferritin: 26 ng/mL (ref 11–307)

## 2018-11-30 LAB — VITAMIN B12: Vitamin B-12: 375 pg/mL (ref 180–914)

## 2018-11-30 LAB — FOLATE: Folate: 7.4 ng/mL (ref >5.9)

## 2018-12-03 ENCOUNTER — Telehealth: Payer: Self-pay | Admitting: Physician Assistant

## 2018-12-03 ENCOUNTER — Inpatient Hospital Stay: Payer: BC Managed Care – PPO

## 2018-12-03 NOTE — Telephone Encounter (Signed)
Called patient left message to return call to schedule an appointment with Artis Delay for Chronic Bil knee pain

## 2018-12-04 ENCOUNTER — Encounter: Payer: Self-pay | Admitting: Internal Medicine

## 2018-12-05 ENCOUNTER — Inpatient Hospital Stay (HOSPITAL_BASED_OUTPATIENT_CLINIC_OR_DEPARTMENT_OTHER): Payer: BC Managed Care – PPO | Admitting: Oncology

## 2018-12-05 ENCOUNTER — Other Ambulatory Visit: Payer: Self-pay

## 2018-12-05 ENCOUNTER — Encounter: Payer: Self-pay | Admitting: Oncology

## 2018-12-05 ENCOUNTER — Inpatient Hospital Stay: Payer: BC Managed Care – PPO

## 2018-12-05 VITALS — BP 155/101 | HR 70 | Temp 96.8°F | Resp 18 | Wt 284.5 lb

## 2018-12-05 DIAGNOSIS — E538 Deficiency of other specified B group vitamins: Secondary | ICD-10-CM | POA: Diagnosis not present

## 2018-12-05 DIAGNOSIS — Z9884 Bariatric surgery status: Secondary | ICD-10-CM

## 2018-12-05 DIAGNOSIS — D500 Iron deficiency anemia secondary to blood loss (chronic): Secondary | ICD-10-CM | POA: Diagnosis not present

## 2018-12-05 DIAGNOSIS — Z86711 Personal history of pulmonary embolism: Secondary | ICD-10-CM

## 2018-12-05 DIAGNOSIS — I2699 Other pulmonary embolism without acute cor pulmonale: Secondary | ICD-10-CM | POA: Diagnosis not present

## 2018-12-05 MED ORDER — CYANOCOBALAMIN 1000 MCG/ML IJ SOLN
1000.0000 ug | Freq: Once | INTRAMUSCULAR | Status: AC
  Administered 2018-12-05: 1000 ug via INTRAMUSCULAR
  Filled 2018-12-05: qty 1

## 2018-12-05 NOTE — Progress Notes (Signed)
Hematology/Oncology Follow Up Note [facility]  [phone] [phone]  Patient Care Team: Glendale Chard, MD as PCP - General (Internal Medicine)   Name of the patient: Kristen [name]  [id number]  01-29-1966   REASON FOR VISIT  follow-up for management of pulmonary embolism  PERTINENT HEMATOLOGY HISTORY Wynell D Llanas is a 53 y.o.afemale who has above oncology history reviewed by me today presented for follow up visit for management of pulmonary embolism.  Patient was seen by me during hospitalization.. Patient presents to ER for evaluation of syncope episode. Patient fainted but did not fall or hurt herself as family member caught patient.  EKG in ED showed ST changed. Troponin elevated and started on heparin drip and admitted.  On day 1 of admission, CODE BLUE was called and patient was unresponsive and received brief chest compression. Patient woke up and talked.  No cardiac arrest.  She reports feeling dizzy and lightheaded and passed out.  Similar to the syncope episode prior to admission. Cardiac catheterization showed normal LV systolic function and hyperdynamic in nature with ejection fraction of 70% consistent with hyper trophic cardiomyopathy.  Significantly elevated pulmonary pressure possibly consistent with pulmonary embolism.  No cardiac intervention due to normal coronary arteries. Patient had VQ scan on 02/15/2018 which showed hypermobility for pulmonary embolism Patient was on heparin drip and was transitioned to Eliquis 10 mg twice daily started on 02/16/2018.  # PE finished 6 months of Eliquis 35m BID.  # History of gastric sleeve  INTERVAL HISTORY Sissy D WChicois a 53y.o.female who has above oncology history reviewed by me today presented for follow up visit for management of pulmonary embolism, iron deficiency, vitamin B12 deficiency.  Patient has been on Eliquis 2.546mBID, tolerating well. No bleeding  events.  Denies fever, chills, nausea, vomiting, diarrhea, chest pain, shortness of breath, abdominal pain, urinary symptoms, lower extremity swelling.   B12 deficiency She has had weekly Vitamin B12 injections weekly x 4.  Denies new complaints today.   History of Gastric sleeve. No epigastric pain. Weight is stable.   Review of Systems  Constitutional: Negative for appetite change, chills, fatigue and fever.  HENT:   Negative for hearing loss and voice change.   Eyes: Negative for eye problems.  Respiratory: Negative for chest tightness, cough and shortness of breath.   Cardiovascular: Negative for chest pain.  Gastrointestinal: Negative for abdominal distention, abdominal pain and blood in stool.  Endocrine: Negative for hot flashes.  Genitourinary: Negative for difficulty urinating and frequency.   Musculoskeletal: Negative for arthralgias.  Skin: Negative for itching and rash.  Neurological: Negative for extremity weakness.  Hematological: Negative for adenopathy.  Psychiatric/Behavioral: Negative for confusion.      Allergies  Allergen Reactions  . Amoxicillin Hives    Has patient had a PCN reaction causing immediate rash, facial/tongue/throat swelling, SOB or lightheadedness with hypotension: No Has patient had a PCN reaction causing severe rash involving mucus membranes or skin necrosis: Yes Has patient had a PCN reaction that required hospitalization No Has patient had a PCN reaction occurring within the last 10 years: No If all of the above answers are "NO", then may proceed with Cephalosporin use.   . Fish Allergy Hives     Past Medical History:  Diagnosis Date  . Arthritis   . [place]  . Hypertension   . Iron deficiency anemia 03/07/2018  . URI (upper respiratory infection)    took  antibiotic and prednisone oct 2019 resolved  . Uses contact lenses      Past Surgical History:  Procedure Laterality Date  . BREAST BIOPSY Right 2015  .  COLONOSCOPY WITH PROPOFOL N/A 03/18/2016   Procedure: COLONOSCOPY WITH PROPOFOL;  Surgeon: Carol Ada, MD;  Location: [facility];  Service: Endoscopy;  Laterality: N/A;  . LAPAROSCOPIC GASTRIC SLEEVE RESECTION N/A 01/29/2018   Procedure: LAPAROSCOPIC GASTRIC SLEEVE RESECTION WITH UPPER ENDO AND ERAS PATHWAY;  Surgeon: Kinsinger, Arta Bruce, MD;  Location: [facility];  Service: General;  Laterality: N/A;  . RIGHT/LEFT HEART CATH AND CORONARY ANGIOGRAPHY N/A 02/15/2018   Procedure: RIGHT/LEFT HEART CATH AND CORONARY ANGIOGRAPHY;  Surgeon: Corey Skains, MD;  Location: [place] [facility];  Service: Cardiovascular;  Laterality: N/A;  . TOTAL HIP ARTHROPLASTY Right 08/04/2017   Procedure: RIGHT TOTAL HIP ARTHROPLASTY ANTERIOR APPROACH;  Surgeon: Mcarthur Rossetti, MD;  Location: [facility];  Service: Orthopedics;  Laterality: Right;    Social History   Socioeconomic History  . Marital status: Married    Spouse name: Not on file  . Number of children: Not on file  . Years of education: Not on file  . Highest education level: Not on file  Occupational History  . Not on file  Social Needs  . Financial resource strain: Not on file  . Food insecurity    Worry: Not on file    Inability: Not on file  . Transportation needs    Medical: Not on file    Non-medical: Not on file  Tobacco Use  . Smoking status: Never Smoker  . Smokeless tobacco: Never Used  Substance and Sexual Activity  . Alcohol use: Not Currently    Comment: OCCASIONAL   . Drug use: No  . Sexual activity: Yes    Birth control/protection: None  Lifestyle  . Physical activity    Days per week: Not on file    Minutes per session: Not on file  . Stress: Not on file  Relationships  . Social Herbalist on phone: Not on file    Gets together: Not on file    Attends religious service: Not on file    Active member of club or organization: Not on file    Attends meetings of clubs or organizations: Not on file     Relationship status: Not on file  . Intimate partner violence    Fear of current or ex partner: Not on file    Emotionally abused: Not on file    Physically abused: Not on file    Forced sexual activity: Not on file  Other Topics Concern  . Not on file  Social History Narrative  . Not on file    Family History  Problem Relation Age of Onset  . Hypertension Mother   . Diabetes Mother   . Hypertension Father   . Diabetes Father   . COPD Father   . Stroke Father   . Congestive Heart Failure Father   . Diabetes Sister   . Hypertension Sister      Current Outpatient Medications:  .  acetaminophen (TYLENOL) 500 MG tablet, Take 500 mg by mouth every 6 (six) hours as needed., Disp: , Rfl:  .  albuterol (PROVENTIL HFA;VENTOLIN HFA) 108 (90 Base) MCG/ACT inhaler, Inhale 1-2 puffs into the lungs every 6 (six) hours as needed for wheezing or shortness of breath., Disp: 1 Inhaler, Rfl: 2 .  apixaban (ELIQUIS) 2.5 MG TABS tablet, Take 1  tablet (2.5 mg total) by mouth 2 (two) times daily., Disp: 60 tablet, Rfl: 3 .  benzonatate (TESSALON) 200 MG capsule, Comments:   Filled Date: Feb 09 2018 12:00AM  Patient Notes: TAKE 1 CAPSULE BY MOUTH EVERY DAY 3 TIMES A DAY AS NEEDED FOR COUGH  Duration: 10, Disp: , Rfl:  .  blood glucose meter kit and supplies KIT, Dispense based on patient and insurance preference. Use up to four times daily as directed. (FOR ICD-9 250.00, 250.01)., Disp: 1 each, Rfl: 0 .  cetirizine (ZYRTEC) 10 MG tablet, Take 10 mg by mouth daily., Disp: , Rfl:  .  docusate sodium (COLACE) 100 MG capsule, Take 100 mg by mouth 2 (two) times daily., Disp: , Rfl:  .  FLUoxetine (PROZAC) 20 MG tablet, TAKE 1 TABLET BY MOUTH EVERY DAY, Disp: 90 tablet, Rfl: 1 .  Multiple Vitamins-Minerals (BARIATRIC MULTIVITAMINS/IRON PO), Take 1 tablet by mouth 2 (two) times daily. Patient using bariatric transdermal patch with calcium B12 and iron, Disp: , Rfl:  .  NONFORMULARY OR COMPOUNDED ITEM, Iron  patch apply every 8 hours, Disp: , Rfl:  .  olmesartan (BENICAR) 20 MG tablet, Take 40 mg by mouth daily. Pt is taking 22m, Disp: , Rfl:  .  ondansetron (ZOFRAN-ODT) 4 MG disintegrating tablet, Comments:   Filled Date: Jan 30 2018 12:00AM  Patient Notes: DISSOLVE 1 TABLET UNDER TONGUE EVERY 6 HOURS AS NEEDED FOR NAUSEA AND  VOMITING  Duration: 5, Disp: , Rfl:   Physical exam:  Vitals:   12/05/18 1356  BP: (!) 155/101  Pulse: 70  Resp: 18  Temp: (!) 96.8 F (36 C)  Weight: 284 lb 8 oz (129 kg)   Physical Exam Constitutional:      General: She is not in acute distress.    Comments: Morbidly obese.   HENT:     Head: Normocephalic and atraumatic.     Comments: Chronic alopecia Eyes:     General: No scleral icterus.    Pupils: Pupils are equal, round, and reactive to light.  Neck:     Musculoskeletal: Normal range of motion and neck supple.  Cardiovascular:     Rate and Rhythm: Normal rate and regular rhythm.     Heart sounds: Normal heart sounds.  Pulmonary:     Effort: Pulmonary effort is normal. No respiratory distress.     Breath sounds: No wheezing.  Abdominal:     General: Bowel sounds are normal. There is no distension.     Palpations: Abdomen is soft. There is no mass.     Tenderness: There is no abdominal tenderness.  Musculoskeletal: Normal range of motion.        General: No deformity.  Skin:    General: Skin is warm and dry.     Findings: No erythema or rash.  Neurological:     Mental Status: She is alert and oriented to person, place, and time.     Cranial Nerves: No cranial nerve deficit.     Coordination: Coordination normal.  Psychiatric:        Behavior: Behavior normal.        Thought Content: Thought content normal.     CMP Latest Ref Rng & Units 11/30/2018  Glucose 70 - 99 mg/dL 121(H)  BUN 6 - 20 mg/dL 18  Creatinine 0.44 - 1.00 mg/dL 1.01(H)  Sodium 135 - 145 mmol/L 144  Potassium 3.5 - 5.1 mmol/L 4.1  Chloride 98 - 111 mmol/L 110  CO2 22  -  32 mmol/L 27  Calcium 8.9 - 10.3 mg/dL 9.2  Total Protein 6.5 - 8.1 g/dL 8.3(H)  Total Bilirubin 0.3 - 1.2 mg/dL 0.5  Alkaline Phos 38 - 126 U/L 76  AST 15 - 41 U/L 20  ALT 0 - 44 U/L 13   CBC Latest Ref Rng & Units 11/30/2018  WBC 4.0 - 10.5 K/uL 5.4  Hemoglobin 12.0 - 15.0 g/dL 12.4  Hematocrit 36.0 - 46.0 % 39.4  Platelets 150 - 400 K/uL 368   RADIOGRAPHIC STUDIES: I have personally reviewed the radiological images as listed and agreed with the findings in the report. Mm 3d Screen Breast Bilateral  Result Date: 11/21/2018 CLINICAL DATA:  Screening. EXAM: DIGITAL SCREENING BILATERAL MAMMOGRAM WITH TOMO AND CAD COMPARISON:  Previous exam(s). ACR Breast Density Category b: There are scattered areas of fibroglandular density. FINDINGS: There are no findings suspicious for malignancy. Images were processed with CAD. IMPRESSION: No mammographic evidence of malignancy. A result letter of this screening mammogram will be mailed directly to the patient. RECOMMENDATION: Screening mammogram in one year. (Code:SM-B-01Y) BI-RADS CATEGORY  1: Negative. Electronically Signed   By: Audie Pinto M.D.   On: 11/21/2018 08:26     Assessment and plan Patient is a 53 y.o. female female with history of morbid obesity status post laparoscopic sleeve gastrectomy on 01/29/2018, diabetes, hypertension, presented to the emergency room for evaluation of syncope episodes.  Status post cardiac cath which showed increased pulmonary hypertension.  Normal coronary artery disease.  VQ scan showed high probability of PE.  1. Iron deficiency anemia due to chronic blood loss   2. H/O bariatric surgery   3. History of pulmonary embolism   4. Vitamin B12 deficiency     #Pulmonary embolism, continue Eliquis 2.6m BID.  # Iron deficiency anemia, in the context of gastric sleeve Labs are reviewed and discussed with patient. Ferritin is 26, decreased from last level 106. Iron saturation 18.  Will arrange her to  have IV Venofer weekly x 2.  Recommend patient to follow up with bariatric surgeon.   #Vitamin B12 deficiency. Continue monthly vitamin b12 10035m monthly.   #History of bariatric surgery/gastric sleeve.  At the risk of vitamin deficiency. Monitor.  # mammogram images were reviewed and discussed We spent sufficient time to discuss many aspect of care, questions were answered to patient's satisfaction.  Orders Placed This Encounter  Procedures  . CBC with Differential/Platelet    Standing Status:   Future    Standing Expiration Date:   12/05/2019  . Ferritin    Standing Status:   Future    Standing Expiration Date:   12/05/2019  . Iron and TIBC    Standing Status:   Future    Standing Expiration Date:   12/05/2019  . Vitamin B12    Standing Status:   Future    Standing Expiration Date:   12/05/2019     RTC in 3 months.     ZhEarlie ServerMD, PhD Hematology Oncology [facility]0/21/2020

## 2018-12-05 NOTE — Progress Notes (Signed)
Patient here for follow up. No concerns voiced.  °

## 2018-12-06 ENCOUNTER — Telehealth: Payer: Self-pay | Admitting: Physician Assistant

## 2018-12-06 NOTE — Telephone Encounter (Signed)
Patient called asked if she can get the collagen injections. The number to contact patient is [phone]

## 2018-12-06 NOTE — Telephone Encounter (Signed)
Called patient left message to return call to schedule an appointment per her request in my chart with Artis Delay for chronic bil knee pain

## 2018-12-12 NOTE — Telephone Encounter (Signed)
That will be fine. 

## 2018-12-12 NOTE — Telephone Encounter (Signed)
Assuming she means the gel injection

## 2018-12-12 NOTE — Telephone Encounter (Signed)
Can we order gel injections for her knees please

## 2018-12-13 ENCOUNTER — Inpatient Hospital Stay: Payer: BC Managed Care – PPO

## 2018-12-13 ENCOUNTER — Other Ambulatory Visit: Payer: Self-pay

## 2018-12-13 VITALS — BP 131/91 | HR 78 | Resp 18

## 2018-12-13 DIAGNOSIS — D500 Iron deficiency anemia secondary to blood loss (chronic): Secondary | ICD-10-CM

## 2018-12-13 DIAGNOSIS — I2699 Other pulmonary embolism without acute cor pulmonale: Secondary | ICD-10-CM | POA: Diagnosis not present

## 2018-12-13 MED ORDER — IRON SUCROSE 20 MG/ML IV SOLN
200.0000 mg | Freq: Once | INTRAVENOUS | Status: AC
  Administered 2018-12-13: 200 mg via INTRAVENOUS
  Filled 2018-12-13: qty 10

## 2018-12-13 MED ORDER — SODIUM CHLORIDE 0.9 % IV SOLN
Freq: Once | INTRAVENOUS | Status: AC
  Administered 2018-12-13: 14:00:00 via INTRAVENOUS
  Filled 2018-12-13: qty 250

## 2018-12-18 ENCOUNTER — Ambulatory Visit (INDEPENDENT_AMBULATORY_CARE_PROVIDER_SITE_OTHER): Payer: BC Managed Care – PPO

## 2018-12-18 ENCOUNTER — Other Ambulatory Visit: Payer: Self-pay

## 2018-12-18 VITALS — BP 130/78 | HR 80 | Temp 98.5°F | Ht 65.2 in | Wt 282.4 lb

## 2018-12-18 DIAGNOSIS — Z23 Encounter for immunization: Secondary | ICD-10-CM | POA: Diagnosis not present

## 2018-12-18 DIAGNOSIS — E119 Type 2 diabetes mellitus without complications: Secondary | ICD-10-CM | POA: Diagnosis not present

## 2018-12-18 NOTE — Progress Notes (Signed)
Pt was seen today to receive pneumo23 vaccine

## 2018-12-18 NOTE — Telephone Encounter (Signed)
Submitted online [id number] for Durolane.  Pending VOB.

## 2018-12-19 NOTE — Telephone Encounter (Signed)
PA request faxed to [facility] today.

## 2018-12-20 ENCOUNTER — Other Ambulatory Visit: Payer: Self-pay

## 2018-12-20 ENCOUNTER — Inpatient Hospital Stay: Payer: BC Managed Care – PPO | Attending: Oncology

## 2018-12-20 VITALS — BP 135/82 | HR 78 | Resp 18

## 2018-12-20 DIAGNOSIS — E119 Type 2 diabetes mellitus without complications: Secondary | ICD-10-CM | POA: Insufficient documentation

## 2018-12-20 DIAGNOSIS — I10 Essential (primary) hypertension: Secondary | ICD-10-CM | POA: Diagnosis not present

## 2018-12-20 DIAGNOSIS — E669 Obesity, unspecified: Secondary | ICD-10-CM | POA: Diagnosis not present

## 2018-12-20 DIAGNOSIS — Z79899 Other long term (current) drug therapy: Secondary | ICD-10-CM | POA: Diagnosis not present

## 2018-12-20 DIAGNOSIS — Z9884 Bariatric surgery status: Secondary | ICD-10-CM | POA: Diagnosis not present

## 2018-12-20 DIAGNOSIS — E538 Deficiency of other specified B group vitamins: Secondary | ICD-10-CM | POA: Insufficient documentation

## 2018-12-20 DIAGNOSIS — D508 Other iron deficiency anemias: Secondary | ICD-10-CM | POA: Insufficient documentation

## 2018-12-20 DIAGNOSIS — Z8249 Family history of ischemic heart disease and other diseases of the circulatory system: Secondary | ICD-10-CM | POA: Insufficient documentation

## 2018-12-20 DIAGNOSIS — Z823 Family history of stroke: Secondary | ICD-10-CM | POA: Insufficient documentation

## 2018-12-20 DIAGNOSIS — Z833 Family history of diabetes mellitus: Secondary | ICD-10-CM | POA: Diagnosis not present

## 2018-12-20 DIAGNOSIS — D500 Iron deficiency anemia secondary to blood loss (chronic): Secondary | ICD-10-CM

## 2018-12-20 DIAGNOSIS — Z836 Family history of other diseases of the respiratory system: Secondary | ICD-10-CM | POA: Insufficient documentation

## 2018-12-20 MED ORDER — SODIUM CHLORIDE 0.9 % IV SOLN
INTRAVENOUS | Status: DC
  Administered 2018-12-20: 14:00:00 via INTRAVENOUS
  Filled 2018-12-20: qty 250

## 2018-12-20 MED ORDER — IRON SUCROSE 20 MG/ML IV SOLN
200.0000 mg | Freq: Once | INTRAVENOUS | Status: AC
  Administered 2018-12-20: 200 mg via INTRAVENOUS
  Filled 2018-12-20: qty 10

## 2018-12-21 NOTE — Telephone Encounter (Signed)
I called to followup and form was not received, faxed again today.  

## 2018-12-26 NOTE — Telephone Encounter (Signed)
Please call patient and schedule Durolane injections bilateral knees, whenever she wants, with Dr Ninfa Linden.   Buy and bill ok.  [employer] PA# [id number], valid 12/21/18 thru 12/21/19, 120 units.

## 2019-01-01 ENCOUNTER — Other Ambulatory Visit: Payer: Self-pay

## 2019-01-02 ENCOUNTER — Inpatient Hospital Stay: Payer: BC Managed Care – PPO

## 2019-01-02 ENCOUNTER — Other Ambulatory Visit: Payer: Self-pay

## 2019-01-02 DIAGNOSIS — D500 Iron deficiency anemia secondary to blood loss (chronic): Secondary | ICD-10-CM

## 2019-01-02 DIAGNOSIS — D508 Other iron deficiency anemias: Secondary | ICD-10-CM | POA: Diagnosis not present

## 2019-01-02 MED ORDER — CYANOCOBALAMIN 1000 MCG/ML IJ SOLN
1000.0000 ug | Freq: Once | INTRAMUSCULAR | Status: AC
  Administered 2019-01-02: 1000 ug via INTRAMUSCULAR
  Filled 2019-01-02: qty 1

## 2019-01-07 ENCOUNTER — Ambulatory Visit (INDEPENDENT_AMBULATORY_CARE_PROVIDER_SITE_OTHER): Payer: BC Managed Care – PPO | Admitting: Orthopaedic Surgery

## 2019-01-07 ENCOUNTER — Encounter: Payer: Self-pay | Admitting: Orthopaedic Surgery

## 2019-01-07 DIAGNOSIS — G8929 Other chronic pain: Secondary | ICD-10-CM

## 2019-01-07 DIAGNOSIS — M1711 Unilateral primary osteoarthritis, right knee: Secondary | ICD-10-CM | POA: Diagnosis not present

## 2019-01-07 DIAGNOSIS — M1712 Unilateral primary osteoarthritis, left knee: Secondary | ICD-10-CM

## 2019-01-07 DIAGNOSIS — M25562 Pain in left knee: Secondary | ICD-10-CM

## 2019-01-07 MED ORDER — SODIUM HYALURONATE 60 MG/3ML IX PRSY
60.0000 mg | PREFILLED_SYRINGE | INTRA_ARTICULAR | Status: AC | PRN
  Administered 2019-01-07: 60 mg via INTRA_ARTICULAR

## 2019-01-07 NOTE — Progress Notes (Signed)
   Procedure Note  Patient: Kristen [name]             Date of Birth: 01-30-1966           MRN: [id number]             Visit Date: 01/07/2019 HPI: Ms. Kristen [name] comes in today for Durolane injection of both knees.  She has chronic pain of both knees.  Has known tricompartmental changes of the right knee.  She has had no injury to either knee.  Physical exam: Bilateral knees no effusion abnormal warmth erythema.  Good range of motion of both knees.  Procedures: Visit Diagnoses:  1. Unilateral primary osteoarthritis, right knee   2. Chronic pain of left knee     Large Joint Inj: bilateral knee on 01/07/2019 3:02 PM Indications: pain Details: 22 G 1.5 in needle, superolateral approach  Arthrogram: No  Medications (Right): 60 mg Sodium Hyaluronate 60 MG/3ML Medications (Left): 60 mg Sodium Hyaluronate 60 MG/3ML Outcome: tolerated well, no immediate complications Procedure, treatment alternatives, risks and benefits explained, specific risks discussed. Consent was given by the patient. Immediately prior to procedure a time out was called to verify the correct patient, procedure, equipment, support staff and site/side marked as required. Patient was prepped and draped in the usual sterile fashion.     Plan: Monitor that the supplemental injections may take 4 to 6 weeks to begin working.  We will see her back on an as-needed basis.  At return would like AP and lateral views of both knees.

## 2019-01-25 ENCOUNTER — Other Ambulatory Visit: Payer: Self-pay | Admitting: Nurse Practitioner

## 2019-01-29 ENCOUNTER — Other Ambulatory Visit: Payer: Self-pay

## 2019-01-30 ENCOUNTER — Other Ambulatory Visit: Payer: Self-pay

## 2019-01-30 ENCOUNTER — Inpatient Hospital Stay: Payer: BC Managed Care – PPO | Attending: Oncology

## 2019-01-30 DIAGNOSIS — Z836 Family history of other diseases of the respiratory system: Secondary | ICD-10-CM | POA: Diagnosis not present

## 2019-01-30 DIAGNOSIS — I10 Essential (primary) hypertension: Secondary | ICD-10-CM | POA: Diagnosis not present

## 2019-01-30 DIAGNOSIS — Z86711 Personal history of pulmonary embolism: Secondary | ICD-10-CM | POA: Diagnosis not present

## 2019-01-30 DIAGNOSIS — Z823 Family history of stroke: Secondary | ICD-10-CM | POA: Insufficient documentation

## 2019-01-30 DIAGNOSIS — Z8249 Family history of ischemic heart disease and other diseases of the circulatory system: Secondary | ICD-10-CM | POA: Insufficient documentation

## 2019-01-30 DIAGNOSIS — Z7901 Long term (current) use of anticoagulants: Secondary | ICD-10-CM | POA: Insufficient documentation

## 2019-01-30 DIAGNOSIS — L659 Nonscarring hair loss, unspecified: Secondary | ICD-10-CM | POA: Diagnosis not present

## 2019-01-30 DIAGNOSIS — Z9884 Bariatric surgery status: Secondary | ICD-10-CM | POA: Insufficient documentation

## 2019-01-30 DIAGNOSIS — Z79899 Other long term (current) drug therapy: Secondary | ICD-10-CM | POA: Diagnosis not present

## 2019-01-30 DIAGNOSIS — Z833 Family history of diabetes mellitus: Secondary | ICD-10-CM | POA: Insufficient documentation

## 2019-01-30 DIAGNOSIS — D509 Iron deficiency anemia, unspecified: Secondary | ICD-10-CM | POA: Diagnosis present

## 2019-01-30 DIAGNOSIS — E538 Deficiency of other specified B group vitamins: Secondary | ICD-10-CM | POA: Insufficient documentation

## 2019-01-30 DIAGNOSIS — D500 Iron deficiency anemia secondary to blood loss (chronic): Secondary | ICD-10-CM

## 2019-01-30 DIAGNOSIS — E119 Type 2 diabetes mellitus without complications: Secondary | ICD-10-CM | POA: Diagnosis not present

## 2019-01-30 MED ORDER — CYANOCOBALAMIN 1000 MCG/ML IJ SOLN
1000.0000 ug | Freq: Once | INTRAMUSCULAR | Status: AC
  Administered 2019-01-30: 1000 ug via INTRAMUSCULAR
  Filled 2019-01-30: qty 1

## 2019-02-01 ENCOUNTER — Other Ambulatory Visit: Payer: Self-pay | Admitting: Oncology

## 2019-02-18 ENCOUNTER — Ambulatory Visit: Payer: BC Managed Care – PPO | Admitting: Dietician

## 2019-02-25 ENCOUNTER — Ambulatory Visit: Payer: Self-pay | Admitting: Nurse Practitioner

## 2019-03-03 ENCOUNTER — Emergency Department: Payer: BC Managed Care – PPO

## 2019-03-03 ENCOUNTER — Emergency Department
Admission: EM | Admit: 2019-03-03 | Discharge: 2019-03-03 | Disposition: A | Discharge status: Home / Self Care | Payer: BC Managed Care – PPO | Attending: Emergency Medicine | Admitting: Emergency Medicine

## 2019-03-03 ENCOUNTER — Other Ambulatory Visit: Payer: Self-pay

## 2019-03-03 DIAGNOSIS — E119 Type 2 diabetes mellitus without complications: Secondary | ICD-10-CM | POA: Insufficient documentation

## 2019-03-03 DIAGNOSIS — I10 Essential (primary) hypertension: Secondary | ICD-10-CM | POA: Diagnosis not present

## 2019-03-03 DIAGNOSIS — E041 Nontoxic single thyroid nodule: Secondary | ICD-10-CM | POA: Diagnosis not present

## 2019-03-03 DIAGNOSIS — R519 Headache, unspecified: Secondary | ICD-10-CM | POA: Diagnosis present

## 2019-03-03 DIAGNOSIS — Z7901 Long term (current) use of anticoagulants: Secondary | ICD-10-CM | POA: Diagnosis not present

## 2019-03-03 DIAGNOSIS — Z79899 Other long term (current) drug therapy: Secondary | ICD-10-CM | POA: Diagnosis not present

## 2019-03-03 DIAGNOSIS — M542 Cervicalgia: Secondary | ICD-10-CM | POA: Diagnosis not present

## 2019-03-03 DIAGNOSIS — Z96641 Presence of right artificial hip joint: Secondary | ICD-10-CM | POA: Insufficient documentation

## 2019-03-03 DIAGNOSIS — Y9389 Activity, other specified: Secondary | ICD-10-CM | POA: Insufficient documentation

## 2019-03-03 DIAGNOSIS — Y92410 Unspecified street and highway as the place of occurrence of the external cause: Secondary | ICD-10-CM | POA: Insufficient documentation

## 2019-03-03 DIAGNOSIS — Y998 Other external cause status: Secondary | ICD-10-CM | POA: Insufficient documentation

## 2019-03-03 MED ORDER — ACETAMINOPHEN 500 MG PO TABS: 500.0000 mg | ORAL_TABLET | Freq: Four times a day (QID) | ORAL | 0 refills | Status: AC | PRN

## 2019-03-03 MED ORDER — METHOCARBAMOL 500 MG PO TABS: 500.0000 mg | ORAL_TABLET | Freq: Three times a day (TID) | ORAL | 0 refills | Status: DC
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
## 2019-03-04 ENCOUNTER — Other Ambulatory Visit: Payer: Self-pay

## 2019-03-05 ENCOUNTER — Telehealth: Payer: Self-pay

## 2019-03-05 ENCOUNTER — Other Ambulatory Visit: Payer: Self-pay

## 2019-03-05 ENCOUNTER — Inpatient Hospital Stay: Payer: BC Managed Care – PPO | Attending: Oncology

## 2019-03-05 DIAGNOSIS — Z880 Allergy status to penicillin: Secondary | ICD-10-CM | POA: Diagnosis not present

## 2019-03-05 DIAGNOSIS — K912 Postsurgical malabsorption, not elsewhere classified: Secondary | ICD-10-CM | POA: Insufficient documentation

## 2019-03-05 DIAGNOSIS — I10 Essential (primary) hypertension: Secondary | ICD-10-CM | POA: Insufficient documentation

## 2019-03-05 DIAGNOSIS — E041 Nontoxic single thyroid nodule: Secondary | ICD-10-CM | POA: Insufficient documentation

## 2019-03-05 DIAGNOSIS — Z833 Family history of diabetes mellitus: Secondary | ICD-10-CM | POA: Insufficient documentation

## 2019-03-05 DIAGNOSIS — M542 Cervicalgia: Secondary | ICD-10-CM | POA: Diagnosis not present

## 2019-03-05 DIAGNOSIS — Z836 Family history of other diseases of the respiratory system: Secondary | ICD-10-CM | POA: Insufficient documentation

## 2019-03-05 DIAGNOSIS — E119 Type 2 diabetes mellitus without complications: Secondary | ICD-10-CM | POA: Insufficient documentation

## 2019-03-05 DIAGNOSIS — Z7901 Long term (current) use of anticoagulants: Secondary | ICD-10-CM | POA: Diagnosis not present

## 2019-03-05 DIAGNOSIS — E538 Deficiency of other specified B group vitamins: Secondary | ICD-10-CM | POA: Diagnosis not present

## 2019-03-05 DIAGNOSIS — Z8249 Family history of ischemic heart disease and other diseases of the circulatory system: Secondary | ICD-10-CM | POA: Insufficient documentation

## 2019-03-05 DIAGNOSIS — Z9884 Bariatric surgery status: Secondary | ICD-10-CM | POA: Diagnosis not present

## 2019-03-05 DIAGNOSIS — Z86711 Personal history of pulmonary embolism: Secondary | ICD-10-CM | POA: Diagnosis not present

## 2019-03-05 DIAGNOSIS — M1990 Unspecified osteoarthritis, unspecified site: Secondary | ICD-10-CM | POA: Insufficient documentation

## 2019-03-05 DIAGNOSIS — D500 Iron deficiency anemia secondary to blood loss (chronic): Secondary | ICD-10-CM

## 2019-03-05 DIAGNOSIS — Z79899 Other long term (current) drug therapy: Secondary | ICD-10-CM | POA: Insufficient documentation

## 2019-03-05 DIAGNOSIS — Z823 Family history of stroke: Secondary | ICD-10-CM | POA: Diagnosis not present

## 2019-03-05 DIAGNOSIS — D509 Iron deficiency anemia, unspecified: Secondary | ICD-10-CM | POA: Insufficient documentation

## 2019-03-05 LAB — CBC WITH DIFFERENTIAL/PLATELET
Abs Immature Granulocytes: 0.01 10*3/uL (ref 0.00–0.07)
Basophils Absolute: 0 10*3/uL (ref 0.0–0.1)
Basophils Relative: 0 %
Eosinophils Absolute: 0.1 10*3/uL (ref 0.0–0.5)
Eosinophils Relative: 1 %
HCT: 41.6 % (ref 36.0–46.0)
Hemoglobin: 13.3 g/dL (ref 12.0–15.0)
Immature Granulocytes: 0 %
Lymphocytes Relative: 46 %
Lymphs Abs: 2.3 10*3/uL (ref 0.7–4.0)
MCH: 28.4 pg (ref 26.0–34.0)
MCHC: 32 g/dL (ref 30.0–36.0)
MCV: 88.9 fL (ref 80.0–100.0)
Monocytes Absolute: 0.4 10*3/uL (ref 0.1–1.0)
Monocytes Relative: 8 %
Neutro Abs: 2.2 10*3/uL (ref 1.7–7.7)
Neutrophils Relative %: 45 %
Platelets: 318 10*3/uL (ref 150–400)
RBC: 4.68 MIL/uL (ref 3.87–5.11)
RDW: 14.6 % (ref 11.5–15.5)
WBC: 4.9 10*3/uL (ref 4.0–10.5)
nRBC: 0 % (ref 0.0–0.2)

## 2019-03-05 LAB — COMPREHENSIVE METABOLIC PANEL
ALT: 14 U/L (ref 0–44)
AST: 24 U/L (ref 15–41)
Albumin: 4.2 g/dL (ref 3.5–5.0)
Alkaline Phosphatase: 76 U/L (ref 38–126)
Anion gap: 8 (ref 5–15)
BUN: 18 mg/dL (ref 6–20)
CO2: 25 mmol/L (ref 22–32)
Calcium: 9.2 mg/dL (ref 8.9–10.3)
Chloride: 104 mmol/L (ref 98–111)
Creatinine, Ser: 0.89 mg/dL (ref 0.44–1.00)
GFR calc Af Amer: >60 mL/min (ref >60)
GFR calc non Af Amer: >60 mL/min (ref >60)
Glucose, Bld: 125 mg/dL — ABNORMAL HIGH (ref 70–99)
Potassium: 4.2 mmol/L (ref 3.5–5.1)
Sodium: 137 mmol/L (ref 135–145)
Total Bilirubin: 0.6 mg/dL (ref 0.3–1.2)
Total Protein: 8.6 g/dL — ABNORMAL HIGH (ref 6.5–8.1)

## 2019-03-05 LAB — IRON AND TIBC
Iron: 77 ug/dL (ref 28–170)
Saturation Ratios: 24 % (ref 10.4–31.8)
TIBC: 325 ug/dL (ref 250–450)
UIBC: 248 ug/dL

## 2019-03-05 LAB — VITAMIN B12: Vitamin B-12: 303 pg/mL (ref 180–914)

## 2019-03-05 LAB — FERRITIN: Ferritin: 72 ng/mL (ref 11–307)
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
## 2019-03-06 ENCOUNTER — Inpatient Hospital Stay: Payer: BC Managed Care – PPO

## 2019-03-06 ENCOUNTER — Inpatient Hospital Stay (HOSPITAL_BASED_OUTPATIENT_CLINIC_OR_DEPARTMENT_OTHER): Payer: BC Managed Care – PPO | Admitting: Oncology

## 2019-03-06 ENCOUNTER — Encounter: Payer: Self-pay | Admitting: Oncology

## 2019-03-06 DIAGNOSIS — Z9884 Bariatric surgery status: Secondary | ICD-10-CM | POA: Diagnosis not present

## 2019-03-06 DIAGNOSIS — Z86711 Personal history of pulmonary embolism: Secondary | ICD-10-CM

## 2019-03-06 DIAGNOSIS — D500 Iron deficiency anemia secondary to blood loss (chronic): Secondary | ICD-10-CM

## 2019-03-06 DIAGNOSIS — E538 Deficiency of other specified B group vitamins: Secondary | ICD-10-CM | POA: Diagnosis not present

## 2019-03-06 DIAGNOSIS — E041 Nontoxic single thyroid nodule: Secondary | ICD-10-CM | POA: Insufficient documentation

## 2019-03-06 DIAGNOSIS — D509 Iron deficiency anemia, unspecified: Secondary | ICD-10-CM | POA: Diagnosis not present
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
## 2019-03-07 ENCOUNTER — Other Ambulatory Visit: Payer: Self-pay | Admitting: Otolaryngology

## 2019-03-07 DIAGNOSIS — E041 Nontoxic single thyroid nodule: Secondary | ICD-10-CM

## 2019-03-12 ENCOUNTER — Ambulatory Visit (INDEPENDENT_AMBULATORY_CARE_PROVIDER_SITE_OTHER): Payer: BC Managed Care – PPO | Admitting: Internal Medicine

## 2019-03-12 ENCOUNTER — Other Ambulatory Visit: Payer: Self-pay

## 2019-03-12 ENCOUNTER — Telehealth: Payer: Self-pay | Admitting: Oncology

## 2019-03-12 ENCOUNTER — Inpatient Hospital Stay: Payer: BC Managed Care – PPO

## 2019-03-12 ENCOUNTER — Encounter: Payer: Self-pay | Admitting: Internal Medicine

## 2019-03-12 DIAGNOSIS — Z09 Encounter for follow-up examination after completed treatment for conditions other than malignant neoplasm: Secondary | ICD-10-CM

## 2019-03-12 DIAGNOSIS — E041 Nontoxic single thyroid nodule: Secondary | ICD-10-CM | POA: Diagnosis not present

## 2019-03-12 DIAGNOSIS — D519 Vitamin B12 deficiency anemia, unspecified: Secondary | ICD-10-CM | POA: Diagnosis not present

## 2019-03-12 DIAGNOSIS — Z712 Person consulting for explanation of examination or test findings: Secondary | ICD-10-CM

## 2019-03-12 DIAGNOSIS — E119 Type 2 diabetes mellitus without complications: Secondary | ICD-10-CM

## 2019-03-12 DIAGNOSIS — Z9884 Bariatric surgery status: Secondary | ICD-10-CM

## 2019-03-12 DIAGNOSIS — E66813 Obesity, class 3: Secondary | ICD-10-CM

## 2019-03-12 DIAGNOSIS — I10 Essential (primary) hypertension: Secondary | ICD-10-CM

## 2019-03-12 DIAGNOSIS — Z6841 Body Mass Index (BMI) 40.0 and over, adult: Secondary | ICD-10-CM

## 2019-03-12 MED ORDER — CYANOCOBALAMIN 1000 MCG/ML IJ SOLN
1000.0000 ug | Freq: Once | INTRAMUSCULAR | Status: AC
  Administered 2019-03-12: 11:00:00 1000 ug via INTRAMUSCULAR
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
## 2019-03-12 NOTE — Patient Instructions (Signed)
Thyroid Nodule  A thyroid nodule is an isolated growth of thyroid cells that forms a lump in your thyroid gland. The thyroid gland is a butterfly-shaped gland. It is found in the lower front of your neck. This gland sends chemical messengers (hormones) through your blood to all parts of your body. These hormones are important in regulating your body temperature and helping your body to use energy. Thyroid nodules are common. Most are not cancerous (benign). You may have one nodule or several nodules. Different types of thyroid nodules include nodules that:  Grow and fill with fluid (thyroid cysts).  Produce too much thyroid hormone (hot nodules or hyperthyroid).  Produce no thyroid hormone (cold nodules or hypothyroid).  Form from cancer cells (thyroid cancers). What are the causes? In most cases, the cause of this condition is not known. What increases the risk? The following factors may make you more likely to develop this condition.  Age. Thyroid nodules become more common in people who are older than 54 years of age.  Gender. ? Benign thyroid nodules are more common in women. ? Cancerous (malignant) thyroid nodules are more common in men.  A family history that includes: ? Thyroid nodules. ? Pheochromocytoma. ? Thyroid carcinoma. ? Hyperparathyroidism.  Certain kinds of thyroid diseases, such as Hashimoto's thyroiditis.  Lack of iodine in your diet.  A history of head and neck radiation, such as from previous cancer treatment. What are the signs or symptoms? In many cases, there are no symptoms. If you have symptoms, they may include:  A lump in your lower neck.  Feeling a lump or tickle in your throat.  Pain in your neck, jaw, or ear.  Having trouble swallowing. Hot nodules may cause symptoms that include:  Weight loss.  Warm, flushed skin.  Feeling hot.  Feeling nervous.  A racing heartbeat. Cold nodules may cause symptoms that include:  Weight  gain.  Dry skin.  Brittle hair. This may also occur with hair loss.  Feeling cold.  Fatigue. Thyroid cancer nodules may cause symptoms that include:  Hard nodules that feel stuck to the thyroid gland.  Hoarseness.  Lumps in the glands near your thyroid (lymph nodes). How is this diagnosed? A thyroid nodule may be felt by your health care provider during a physical exam. This condition may also be diagnosed based on your symptoms. You may also have tests, including:  An ultrasound. This may be done to confirm the diagnosis.  A biopsy. This involves taking a sample from the nodule and looking at it under a microscope.  Blood tests to make sure that your thyroid is working properly.  A thyroid scan. This test uses a radioactive tracer injected into a vein to create an image of the thyroid gland on a computer screen.  Imaging tests such as MRI or CT scan. These may be done if: ? Your nodule is large. ? Your nodule is blocking your airway. ? Cancer is suspected. How is this treated? Treatment depends on the cause and size of your nodule or nodules. If the nodule is benign, treatment may not be necessary. Your health care provider may monitor the nodule to see if it goes away without treatment. If the nodule continues to grow, is cancerous, or does not go away, treatment may be needed. Treatment may include:  Having a cystic nodule drained with a needle.  Ablation therapy. In this treatment, alcohol is injected into the area of the nodule to destroy the cells. Ablation with heat (  thermal ablation) may also be used.  Radioactive iodine. In this treatment, radioactive iodine is given as a pill or liquid that you drink. This substance causes the thyroid nodule to shrink.  Surgery to remove the nodule. Part or all of your thyroid gland may need to be removed as well.  Medicines. Follow these instructions at home:  Pay attention to any changes in your nodule.  Take  over-the-counter and prescription medicines only as told by your health care provider.  Keep all follow-up visits as told by your health care provider. This is important. Contact a health care provider if:  Your voice changes.  You have trouble swallowing.  You have pain in your neck, ear, or jaw that is getting worse.  Your nodule gets bigger.  Your nodule starts to make it harder for you to breathe.  Your muscles look like they are shrinking (muscle wasting). Get help right away if:  You have chest pain.  There is a loss of consciousness.  You have a sudden fever.  You feel confused.  You are seeing or hearing things that other people do not see or hear (having hallucinations).  You feel very weak.  You have mood swings.  You feel very restless.  You feel suddenly nauseous or throw up.  You suddenly have diarrhea. Summary  A thyroid nodule is an isolated growth of thyroid cells that forms a lump in your thyroid gland.  Thyroid nodules are common. Most are not cancerous (benign). You may have one nodule or several nodules.  Treatment depends on the cause and size of your nodule or nodules. If the nodule is benign, treatment may not be necessary.  Your health care provider may monitor the nodule to see if it goes away without treatment. If the nodule continues to grow, is cancerous, or does not go away, treatment may be needed. This information is not intended to replace advice given to you by your health care provider. Make sure you discuss any questions you have with your health care provider. Document Revised: 09/15/2017 Document Reviewed: 09/18/2017 Elsevier Patient Education  2020 [employer]  

## 2019-03-12 NOTE — Telephone Encounter (Signed)
Patient phoned and cancelled the B12 injection at the [facility] as she stated that she received it at her PCP office earlier this morning. Appt at [facility] has been cancelled.

## 2019-03-13 ENCOUNTER — Ambulatory Visit: Payer: BC Managed Care – PPO

## 2019-03-13 LAB — BMP8+EGFR
BUN/Creatinine Ratio: 18 (ref 9–23)
BUN: 18 mg/dL (ref 6–24)
CO2: 24 mmol/L (ref 20–29)
Calcium: 9.3 mg/dL (ref 8.7–10.2)
Chloride: 107 mmol/L — ABNORMAL HIGH (ref 96–106)
Creatinine, Ser: 0.99 mg/dL (ref 0.57–1.00)
GFR calc Af Amer: 75 mL/min/{1.73_m2} (ref >59)
GFR calc non Af Amer: 65 mL/min/{1.73_m2} (ref >59)
Glucose: 130 mg/dL — ABNORMAL HIGH (ref 65–99)
Potassium: 5.1 mmol/L (ref 3.5–5.2)
Sodium: 142 mmol/L (ref 134–144)

## 2019-03-13 LAB — HEMOGLOBIN A1C
Est. average glucose Bld gHb Est-mCnc: 146 mg/dL
Hgb A1c MFr Bld: 6.7 % — ABNORMAL HIGH (ref 4.8–5.6)

## 2019-03-15 ENCOUNTER — Other Ambulatory Visit: Payer: Self-pay

## 2019-03-15 ENCOUNTER — Ambulatory Visit
Admission: RE | Admit: 2019-03-15 | Discharge: 2019-03-15 | Disposition: A | Discharge status: Home / Self Care | Payer: BC Managed Care – PPO | Source: Ambulatory Visit | Attending: Otolaryngology | Admitting: Otolaryngology

## 2019-03-15 DIAGNOSIS — E041 Nontoxic single thyroid nodule: Secondary | ICD-10-CM | POA: Diagnosis present

## 2019-03-20 ENCOUNTER — Other Ambulatory Visit: Payer: Self-pay | Admitting: Otolaryngology

## 2019-03-20 DIAGNOSIS — E041 Nontoxic single thyroid nodule: Secondary | ICD-10-CM

## 2019-03-28 ENCOUNTER — Other Ambulatory Visit: Payer: Self-pay

## 2019-03-28 ENCOUNTER — Encounter: Payer: Self-pay | Admitting: Dietician

## 2019-03-28 ENCOUNTER — Ambulatory Visit
Admission: RE | Admit: 2019-03-28 | Discharge: 2019-03-28 | Disposition: A | Discharge status: Home / Self Care | Payer: BC Managed Care – PPO | Source: Ambulatory Visit | Attending: Otolaryngology | Admitting: Otolaryngology

## 2019-03-28 DIAGNOSIS — E041 Nontoxic single thyroid nodule: Secondary | ICD-10-CM

## 2019-03-28 NOTE — Discharge Instructions (Signed)
Thyroid Needle Biopsy, Care After This sheet gives you information about how to care for yourself after your procedure. Your health care provider may also give you more specific instructions. If you have problems or questions, contact your health care provider. What can I expect after the procedure? After the procedure, it is common to have:  Soreness and tenderness that lasts for a few days.  Bruising where the needle was inserted (puncture site). Follow these instructions at home:   Take over-the-counter and prescription medicines only as told by your health care provider.  To help ease discomfort, keep your head raised (elevated) when you are lying down. When you move from lying down to sitting up, use both hands to support the back of your head and neck.  Check your puncture site every day for signs of infection. Check for: ? Redness, swelling, or pain. ? Fluid or blood. ? Warmth. ? Pus or a bad smell.  Return to your normal activities as told by your health care provider. Ask your health care provider what activities are safe for you.  Keep all follow-up visits as told by your health care provider. This is important. Contact a health care provider if:  You have redness, swelling, or pain around your puncture site.  You have fluid or blood coming from your puncture site.  Your puncture site feels warm to the touch.  You have pus or a bad smell coming from your puncture site.  You have a fever. Get help right away if:  You have severe bleeding from the puncture site.  You have difficulty swallowing.  You have swollen glands (lymph nodes) in your neck. Summary  It is common to have some bruising and soreness where the needle was inserted in your lower front neck area (puncture site).  Check your puncture site every day for signs of infection, such as redness, swelling, or pain.  Get help right away if you have severe bleeding from your puncture site. This  information is not intended to replace advice given to you by your health care provider. Make sure you discuss any questions you have with your health care provider. Document Revised: 01/13/2017 Document Reviewed: 11/14/2016 Elsevier Patient Education  2020 Elsevier Inc.  

## 2019-03-28 NOTE — Progress Notes (Signed)
Patient cancelled her appointment for 37-month post-op MNT on 04/01/19. She will reschedule later as she has other issues to manage at this time.

## 2019-03-29 LAB — CYTOLOGY - NON PAP

## 2019-04-01 ENCOUNTER — Ambulatory Visit: Payer: BC Managed Care – PPO | Admitting: Dietician

## 2019-04-02 ENCOUNTER — Other Ambulatory Visit: Payer: Self-pay

## 2019-04-02 ENCOUNTER — Encounter: Payer: Self-pay | Admitting: Internal Medicine

## 2019-04-02 DIAGNOSIS — E041 Nontoxic single thyroid nodule: Secondary | ICD-10-CM

## 2019-04-08 ENCOUNTER — Ambulatory Visit: Payer: Self-pay | Admitting: Nurse Practitioner

## 2019-04-12 ENCOUNTER — Other Ambulatory Visit: Payer: Self-pay

## 2019-04-12 ENCOUNTER — Inpatient Hospital Stay: Payer: BC Managed Care – PPO | Attending: Oncology

## 2019-04-12 DIAGNOSIS — I10 Essential (primary) hypertension: Secondary | ICD-10-CM | POA: Insufficient documentation

## 2019-04-12 DIAGNOSIS — D508 Other iron deficiency anemias: Secondary | ICD-10-CM | POA: Diagnosis present

## 2019-04-12 DIAGNOSIS — Z836 Family history of other diseases of the respiratory system: Secondary | ICD-10-CM | POA: Insufficient documentation

## 2019-04-12 DIAGNOSIS — Z7901 Long term (current) use of anticoagulants: Secondary | ICD-10-CM | POA: Insufficient documentation

## 2019-04-12 DIAGNOSIS — D500 Iron deficiency anemia secondary to blood loss (chronic): Secondary | ICD-10-CM

## 2019-04-12 DIAGNOSIS — K912 Postsurgical malabsorption, not elsewhere classified: Secondary | ICD-10-CM | POA: Insufficient documentation

## 2019-04-12 DIAGNOSIS — Z79899 Other long term (current) drug therapy: Secondary | ICD-10-CM | POA: Insufficient documentation

## 2019-04-12 DIAGNOSIS — E538 Deficiency of other specified B group vitamins: Secondary | ICD-10-CM | POA: Insufficient documentation

## 2019-04-12 DIAGNOSIS — Z9884 Bariatric surgery status: Secondary | ICD-10-CM | POA: Insufficient documentation

## 2019-04-12 DIAGNOSIS — Z833 Family history of diabetes mellitus: Secondary | ICD-10-CM | POA: Insufficient documentation

## 2019-04-12 DIAGNOSIS — Z86711 Personal history of pulmonary embolism: Secondary | ICD-10-CM | POA: Insufficient documentation

## 2019-04-12 DIAGNOSIS — E119 Type 2 diabetes mellitus without complications: Secondary | ICD-10-CM | POA: Insufficient documentation

## 2019-04-12 DIAGNOSIS — Z8249 Family history of ischemic heart disease and other diseases of the circulatory system: Secondary | ICD-10-CM | POA: Diagnosis not present

## 2019-04-12 MED ORDER — CYANOCOBALAMIN 1000 MCG/ML IJ SOLN
1000.0000 ug | Freq: Once | INTRAMUSCULAR | Status: AC
  Administered 2019-04-12: 1000 ug via INTRAMUSCULAR
  Filled 2019-04-12: qty 1

## 2019-04-23 ENCOUNTER — Telehealth: Payer: Self-pay

## 2019-04-23 NOTE — Telephone Encounter (Signed)
Pt and pharm notified of approval of fluoxetine. Cost to pt is $103 but pharm has coupon that lowered it to $28.69

## 2019-05-10 ENCOUNTER — Inpatient Hospital Stay: Payer: BC Managed Care – PPO

## 2019-05-13 ENCOUNTER — Inpatient Hospital Stay: Payer: BC Managed Care – PPO | Attending: Oncology

## 2019-05-13 ENCOUNTER — Other Ambulatory Visit: Payer: Self-pay

## 2019-05-13 DIAGNOSIS — Z9884 Bariatric surgery status: Secondary | ICD-10-CM | POA: Diagnosis not present

## 2019-05-13 DIAGNOSIS — Z86711 Personal history of pulmonary embolism: Secondary | ICD-10-CM | POA: Diagnosis not present

## 2019-05-13 DIAGNOSIS — D509 Iron deficiency anemia, unspecified: Secondary | ICD-10-CM | POA: Diagnosis present

## 2019-05-13 DIAGNOSIS — E041 Nontoxic single thyroid nodule: Secondary | ICD-10-CM | POA: Diagnosis not present

## 2019-05-13 DIAGNOSIS — E538 Deficiency of other specified B group vitamins: Secondary | ICD-10-CM | POA: Diagnosis not present

## 2019-05-13 DIAGNOSIS — Z79899 Other long term (current) drug therapy: Secondary | ICD-10-CM | POA: Diagnosis not present

## 2019-05-13 DIAGNOSIS — D500 Iron deficiency anemia secondary to blood loss (chronic): Secondary | ICD-10-CM

## 2019-05-13 MED ORDER — CYANOCOBALAMIN 1000 MCG/ML IJ SOLN
1000.0000 ug | Freq: Once | INTRAMUSCULAR | Status: AC
  Administered 2019-05-13: 1000 ug via INTRAMUSCULAR
  Filled 2019-05-13: qty 1

## 2019-05-29 ENCOUNTER — Encounter: Payer: Self-pay | Admitting: Internal Medicine

## 2019-05-29 ENCOUNTER — Other Ambulatory Visit: Payer: Self-pay

## 2019-05-29 MED ORDER — FLUOXETINE HCL 20 MG PO CAPS: 20.0000 mg | ORAL_CAPSULE | Freq: Every day | ORAL | 1 refills | Status: DC

## 2019-05-30 ENCOUNTER — Other Ambulatory Visit: Payer: Self-pay | Admitting: Oncology

## 2019-06-10 ENCOUNTER — Inpatient Hospital Stay: Payer: BC Managed Care – PPO | Attending: Oncology

## 2019-06-10 ENCOUNTER — Other Ambulatory Visit: Payer: Self-pay

## 2019-06-10 ENCOUNTER — Other Ambulatory Visit: Payer: Self-pay | Admitting: Oncology

## 2019-06-10 DIAGNOSIS — Z79899 Other long term (current) drug therapy: Secondary | ICD-10-CM | POA: Diagnosis not present

## 2019-06-10 DIAGNOSIS — E538 Deficiency of other specified B group vitamins: Secondary | ICD-10-CM

## 2019-06-10 DIAGNOSIS — Z86711 Personal history of pulmonary embolism: Secondary | ICD-10-CM | POA: Insufficient documentation

## 2019-06-10 DIAGNOSIS — Z833 Family history of diabetes mellitus: Secondary | ICD-10-CM | POA: Diagnosis not present

## 2019-06-10 DIAGNOSIS — E041 Nontoxic single thyroid nodule: Secondary | ICD-10-CM | POA: Diagnosis not present

## 2019-06-10 DIAGNOSIS — Z823 Family history of stroke: Secondary | ICD-10-CM | POA: Insufficient documentation

## 2019-06-10 DIAGNOSIS — D509 Iron deficiency anemia, unspecified: Secondary | ICD-10-CM | POA: Insufficient documentation

## 2019-06-10 DIAGNOSIS — Z9884 Bariatric surgery status: Secondary | ICD-10-CM | POA: Insufficient documentation

## 2019-06-10 DIAGNOSIS — Z836 Family history of other diseases of the respiratory system: Secondary | ICD-10-CM | POA: Diagnosis not present

## 2019-06-10 DIAGNOSIS — Z8249 Family history of ischemic heart disease and other diseases of the circulatory system: Secondary | ICD-10-CM | POA: Insufficient documentation

## 2019-06-10 DIAGNOSIS — D500 Iron deficiency anemia secondary to blood loss (chronic): Secondary | ICD-10-CM

## 2019-06-10 MED ORDER — CYANOCOBALAMIN 1000 MCG/ML IJ SOLN
1000.0000 ug | Freq: Once | INTRAMUSCULAR | Status: AC
  Administered 2019-06-10: 1000 ug via INTRAMUSCULAR
  Filled 2019-06-10: qty 1

## 2019-06-10 MED ORDER — CYANOCOBALAMIN 1000 MCG/ML IJ SOLN: 1000.0000 ug | Freq: Once | INTRAMUSCULAR | Status: DC

## 2019-06-25 ENCOUNTER — Other Ambulatory Visit: Payer: Self-pay

## 2019-06-25 ENCOUNTER — Ambulatory Visit (INDEPENDENT_AMBULATORY_CARE_PROVIDER_SITE_OTHER): Payer: BC Managed Care – PPO | Admitting: Nurse Practitioner

## 2019-06-25 VITALS — BP 124/84 | HR 86 | Temp 98.2°F | Ht 64.5 in | Wt 297.0 lb

## 2019-06-25 DIAGNOSIS — Z86711 Personal history of pulmonary embolism: Secondary | ICD-10-CM

## 2019-06-25 DIAGNOSIS — D473 Essential (hemorrhagic) thrombocythemia: Secondary | ICD-10-CM

## 2019-06-25 DIAGNOSIS — I272 Pulmonary hypertension, unspecified: Secondary | ICD-10-CM | POA: Insufficient documentation

## 2019-06-25 DIAGNOSIS — E119 Type 2 diabetes mellitus without complications: Secondary | ICD-10-CM | POA: Diagnosis not present

## 2019-06-25 LAB — HEMOGLOBIN A1C
Est. average glucose Bld gHb Est-mCnc: 146 mg/dL
Hgb A1c MFr Bld: 6.7 % — ABNORMAL HIGH (ref 4.8–5.6)

## 2019-06-25 NOTE — Progress Notes (Signed)
This visit occurred during the SARS-CoV-2 public health emergency.  Safety protocols were in place, including screening questions prior to the visit, additional usage of staff PPE, and extensive cleaning of exam room while observing appropriate contact time as indicated for disinfecting solutions.  Subjective:     Patient ID: Kristen [name] , female    DOB: Nov 14, 1965 , 54 y.o.   MRN: [id number]   Chief Complaint  Patient presents with  . Diabetes    HPI  She has had both of her Covid vaccine.    Wt Readings from Last 3 Encounters: 06/25/19 : 297 lb (134.7 kg) 03/12/19 : 286 lb 9.6 oz (130 kg) 03/03/19 : 272 lb (123.4 kg)  She is still trying to get the balance with her food.  She has incorporated light walking 3 times a week about 1/2 mile.  Plan to increase time and distance. She is trying to do small changes. She can tell by non scale "wins".   She is to have injections to her knees in June for her cartilage.   She is to have a follow up with the ENT for a nodule on her thyroid She is feeling good   Diabetes She presents for her follow-up diabetic visit. She has type 2 diabetes mellitus. There are no hypoglycemic associated symptoms. Pertinent negatives for hypoglycemia include no dizziness or headaches. Pertinent negatives for diabetes include no chest pain. There are no hypoglycemic complications. Symptoms are stable. There are no diabetic complications. Risk factors for coronary artery disease include obesity and sedentary lifestyle. She is compliant with treatment all of the time.     Past Medical History:  Diagnosis Date  . Arthritis   . Diabetes ([place])    Type 2  . Hypertension   . Iron deficiency anemia 03/07/2018  . URI (upper respiratory infection)    took antibiotic and prednisone oct 2019 resolved  . Uses contact lenses      Family History  Problem Relation Age of Onset  . Hypertension Mother   . Diabetes Mother   . Hypertension Father   . Diabetes  Father   . COPD Father   . Stroke Father   . Congestive Heart Failure Father   . Diabetes Sister   . Hypertension Sister      Current Outpatient Medications:  .  acetaminophen (TYLENOL) 500 MG tablet, Take 1 tablet (500 mg total) by mouth every 6 (six) hours as needed., Disp: 30 tablet, Rfl: 0 .  albuterol (PROVENTIL HFA;VENTOLIN HFA) 108 (90 Base) MCG/ACT inhaler, Inhale 1-2 puffs into the lungs every 6 (six) hours as needed for wheezing or shortness of breath., Disp: 1 Inhaler, Rfl: 2 .  blood glucose meter kit and supplies KIT, Dispense based on patient and insurance preference. Use up to four times daily as directed. (FOR ICD-9 250.00, 250.01)., Disp: 1 each, Rfl: 0 .  cetirizine (ZYRTEC) 10 MG tablet, Take 10 mg by mouth daily., Disp: , Rfl:  .  docusate sodium (COLACE) 100 MG capsule, Take 100 mg by mouth 2 (two) times daily., Disp: , Rfl:  .  ELIQUIS 2.5 MG TABS tablet, TAKE 1 TABLET BY MOUTH TWICE A DAY, Disp: 60 tablet, Rfl: 3 .  FLUoxetine (PROZAC) 20 MG capsule, Take 1 capsule (20 mg total) by mouth daily., Disp: 90 capsule, Rfl: 1 .  Multiple Vitamins-Minerals (BARIATRIC MULTIVITAMINS/IRON PO), Take 1 tablet by mouth 2 (two) times daily. Patient using bariatric transdermal patch with calcium B12 and iron, Disp: , Rfl:  .  NONFORMULARY OR COMPOUNDED ITEM, Iron patch apply every 8 hours, Disp: , Rfl:  .  olmesartan (BENICAR) 20 MG tablet, Take 40 mg by mouth daily. Pt is taking 91m, Disp: , Rfl:    Allergies  Allergen Reactions  . Amoxicillin Hives    Has patient had a PCN reaction causing immediate rash, facial/tongue/throat swelling, SOB or lightheadedness with hypotension: No Has patient had a PCN reaction causing severe rash involving mucus membranes or skin necrosis: Yes Has patient had a PCN reaction that required hospitalization No Has patient had a PCN reaction occurring within the last 10 years: No If all of the above answers are "NO", then may proceed with  Cephalosporin use.   . Fish Allergy Hives     Review of Systems  Constitutional: Negative.   Respiratory: Negative.   Cardiovascular: Negative.  Negative for chest pain, palpitations and leg swelling.  Neurological: Negative for dizziness and headaches.  Psychiatric/Behavioral: Negative.      Today's Vitals   06/25/19 0953  BP: 124/84  Pulse: 86  Temp: 98.2 F (36.8 C)  Weight: 297 lb (134.7 kg)  Height: 5' 4.5" (1.638 m)   Body mass index is 50.19 kg/m.   Objective:  Physical Exam Vitals reviewed.  Constitutional:      General: She is not in acute distress.    Appearance: Normal appearance. She is well-developed. She is obese.  Cardiovascular:     Rate and Rhythm: Normal rate and regular rhythm.     Pulses: Normal pulses.     Heart sounds: Normal heart sounds. No murmur.  Pulmonary:     Effort: Pulmonary effort is normal.     Breath sounds: Normal breath sounds.  Chest:     Chest wall: No tenderness.  Musculoskeletal:        General: Normal range of motion.  Skin:    General: Skin is warm and dry.     Capillary Refill: Capillary refill takes less than 2 seconds.  Neurological:     General: No focal deficit present.     Mental Status: She is alert and oriented to person, place, and time.  Psychiatric:        Mood and Affect: Mood normal.        Behavior: Behavior normal.        Thought Content: Thought content normal.        Judgment: Judgment normal.         Assessment And Plan:     1. Type 2 diabetes mellitus without complication, without long-term current use of insulin (HCC)  Chronic, no current medications  Will check HgbA1c - Hemoglobin A1c  2. History of pulmonary embolism  She continues with anticoagulant without any problems   JMinette Brine FNP    THE PATIENT IS ENCOURAGED TO PRACTICE SOCIAL DISTANCING DUE TO THE COVID-19 PANDEMIC.

## 2019-07-08 ENCOUNTER — Inpatient Hospital Stay: Payer: BC Managed Care – PPO | Attending: Oncology

## 2019-07-08 ENCOUNTER — Other Ambulatory Visit: Payer: Self-pay

## 2019-07-08 DIAGNOSIS — Z833 Family history of diabetes mellitus: Secondary | ICD-10-CM | POA: Diagnosis not present

## 2019-07-08 DIAGNOSIS — Z86711 Personal history of pulmonary embolism: Secondary | ICD-10-CM | POA: Diagnosis not present

## 2019-07-08 DIAGNOSIS — E538 Deficiency of other specified B group vitamins: Secondary | ICD-10-CM | POA: Insufficient documentation

## 2019-07-08 DIAGNOSIS — Z836 Family history of other diseases of the respiratory system: Secondary | ICD-10-CM | POA: Diagnosis not present

## 2019-07-08 DIAGNOSIS — D508 Other iron deficiency anemias: Secondary | ICD-10-CM | POA: Diagnosis present

## 2019-07-08 DIAGNOSIS — D500 Iron deficiency anemia secondary to blood loss (chronic): Secondary | ICD-10-CM

## 2019-07-08 DIAGNOSIS — Z823 Family history of stroke: Secondary | ICD-10-CM | POA: Insufficient documentation

## 2019-07-08 DIAGNOSIS — Z9884 Bariatric surgery status: Secondary | ICD-10-CM | POA: Insufficient documentation

## 2019-07-08 DIAGNOSIS — Z8249 Family history of ischemic heart disease and other diseases of the circulatory system: Secondary | ICD-10-CM | POA: Insufficient documentation

## 2019-07-08 DIAGNOSIS — K912 Postsurgical malabsorption, not elsewhere classified: Secondary | ICD-10-CM | POA: Insufficient documentation

## 2019-07-08 MED ORDER — CYANOCOBALAMIN 1000 MCG/ML IJ SOLN
1000.0000 ug | Freq: Once | INTRAMUSCULAR | Status: AC
  Administered 2019-07-08: 1000 ug via INTRAMUSCULAR
  Filled 2019-07-08: qty 1

## 2019-07-14 ENCOUNTER — Other Ambulatory Visit: Payer: Self-pay | Admitting: Nurse Practitioner

## 2019-07-14 ENCOUNTER — Encounter: Payer: Self-pay | Admitting: Nurse Practitioner

## 2019-07-25 ENCOUNTER — Encounter: Payer: BLUE CROSS/BLUE SHIELD | Admitting: Internal Medicine

## 2019-08-08 ENCOUNTER — Telehealth: Payer: Self-pay

## 2019-08-08 NOTE — Telephone Encounter (Signed)
Do I need to call her back? Did she leave a message?

## 2019-08-08 NOTE — Telephone Encounter (Signed)
Patient called.

## 2019-08-09 ENCOUNTER — Inpatient Hospital Stay: Payer: BC Managed Care – PPO | Attending: Oncology

## 2019-08-09 DIAGNOSIS — Z9884 Bariatric surgery status: Secondary | ICD-10-CM | POA: Insufficient documentation

## 2019-08-09 DIAGNOSIS — D509 Iron deficiency anemia, unspecified: Secondary | ICD-10-CM | POA: Insufficient documentation

## 2019-08-09 DIAGNOSIS — E538 Deficiency of other specified B group vitamins: Secondary | ICD-10-CM | POA: Insufficient documentation

## 2019-08-14 ENCOUNTER — Inpatient Hospital Stay: Payer: BC Managed Care – PPO

## 2019-08-14 ENCOUNTER — Other Ambulatory Visit: Payer: Self-pay

## 2019-08-14 DIAGNOSIS — D509 Iron deficiency anemia, unspecified: Secondary | ICD-10-CM | POA: Diagnosis present

## 2019-08-14 DIAGNOSIS — D500 Iron deficiency anemia secondary to blood loss (chronic): Secondary | ICD-10-CM

## 2019-08-14 DIAGNOSIS — E538 Deficiency of other specified B group vitamins: Secondary | ICD-10-CM | POA: Diagnosis present

## 2019-08-14 DIAGNOSIS — Z9884 Bariatric surgery status: Secondary | ICD-10-CM | POA: Diagnosis not present

## 2019-08-14 MED ORDER — CYANOCOBALAMIN 1000 MCG/ML IJ SOLN
1000.0000 ug | Freq: Once | INTRAMUSCULAR | Status: AC
  Administered 2019-08-14: 1000 ug via INTRAMUSCULAR
  Filled 2019-08-14: qty 1

## 2019-08-26 ENCOUNTER — Ambulatory Visit (INDEPENDENT_AMBULATORY_CARE_PROVIDER_SITE_OTHER): Payer: BC Managed Care – PPO | Admitting: Orthopaedic Surgery

## 2019-08-26 ENCOUNTER — Other Ambulatory Visit: Payer: Self-pay

## 2019-08-26 ENCOUNTER — Encounter: Payer: Self-pay | Admitting: Orthopaedic Surgery

## 2019-08-26 DIAGNOSIS — M1712 Unilateral primary osteoarthritis, left knee: Secondary | ICD-10-CM

## 2019-08-26 DIAGNOSIS — M1711 Unilateral primary osteoarthritis, right knee: Secondary | ICD-10-CM | POA: Diagnosis not present

## 2019-08-26 MED ORDER — SODIUM HYALURONATE 60 MG/3ML IX PRSY
60.0000 mg | PREFILLED_SYRINGE | INTRA_ARTICULAR | Status: AC | PRN
  Administered 2019-08-26: 60 mg via INTRA_ARTICULAR

## 2019-08-26 NOTE — Progress Notes (Signed)
   Procedure Note  Patient: Kristen [name]             Date of Birth: [birth year]           MRN: [id number]             Visit Date: 08/26/2019 HPI: Mrs. Kristen [name] comes in today for Durolane injections both knees scheduled.  She has had no known injury to either knee.  Having increased knee pain bilaterally.  No fevers chills.  She has known osteoarthritis of both knees.  Bilateral knees: No abnormal warmth erythema or effusion.  Good range of motion both knees. Procedures: Visit Diagnoses:  1. Unilateral primary osteoarthritis, right knee   2. Primary osteoarthritis of left knee     Large Joint Inj: bilateral knee on 08/26/2019 5:32 PM Indications: pain Details: 22 G 1.5 in needle, anterolateral approach  Arthrogram: No  Medications (Right): 60 mg Sodium Hyaluronate 60 MG/3ML Medications (Left): 60 mg Sodium Hyaluronate 60 MG/3ML Outcome: tolerated well, no immediate complications Procedure, treatment alternatives, risks and benefits explained, specific risks discussed. Consent was given by the patient. Immediately prior to procedure a time out was called to verify the correct patient, procedure, equipment, support staff and site/side marked as required. Patient was prepped and draped in the usual sterile fashion.     Plan: Follow-up on an as-needed basis she knows to wait at least 6 months between supplemental injections.

## 2019-08-30 ENCOUNTER — Encounter (HOSPITAL_COMMUNITY): Payer: Self-pay

## 2019-09-02 ENCOUNTER — Other Ambulatory Visit: Payer: Self-pay

## 2019-09-02 ENCOUNTER — Inpatient Hospital Stay: Payer: BC Managed Care – PPO | Attending: Oncology

## 2019-09-02 DIAGNOSIS — Z79899 Other long term (current) drug therapy: Secondary | ICD-10-CM | POA: Diagnosis not present

## 2019-09-02 DIAGNOSIS — I10 Essential (primary) hypertension: Secondary | ICD-10-CM | POA: Insufficient documentation

## 2019-09-02 DIAGNOSIS — M1990 Unspecified osteoarthritis, unspecified site: Secondary | ICD-10-CM | POA: Diagnosis not present

## 2019-09-02 DIAGNOSIS — Z833 Family history of diabetes mellitus: Secondary | ICD-10-CM | POA: Insufficient documentation

## 2019-09-02 DIAGNOSIS — D500 Iron deficiency anemia secondary to blood loss (chronic): Secondary | ICD-10-CM

## 2019-09-02 DIAGNOSIS — I2699 Other pulmonary embolism without acute cor pulmonale: Secondary | ICD-10-CM | POA: Insufficient documentation

## 2019-09-02 DIAGNOSIS — Z8249 Family history of ischemic heart disease and other diseases of the circulatory system: Secondary | ICD-10-CM | POA: Insufficient documentation

## 2019-09-02 DIAGNOSIS — Z836 Family history of other diseases of the respiratory system: Secondary | ICD-10-CM | POA: Insufficient documentation

## 2019-09-02 DIAGNOSIS — Z880 Allergy status to penicillin: Secondary | ICD-10-CM | POA: Diagnosis not present

## 2019-09-02 DIAGNOSIS — R55 Syncope and collapse: Secondary | ICD-10-CM | POA: Diagnosis not present

## 2019-09-02 DIAGNOSIS — Z7901 Long term (current) use of anticoagulants: Secondary | ICD-10-CM | POA: Diagnosis not present

## 2019-09-02 DIAGNOSIS — D509 Iron deficiency anemia, unspecified: Secondary | ICD-10-CM | POA: Diagnosis not present

## 2019-09-02 DIAGNOSIS — Z7984 Long term (current) use of oral hypoglycemic drugs: Secondary | ICD-10-CM | POA: Insufficient documentation

## 2019-09-02 DIAGNOSIS — E119 Type 2 diabetes mellitus without complications: Secondary | ICD-10-CM | POA: Diagnosis not present

## 2019-09-02 DIAGNOSIS — R42 Dizziness and giddiness: Secondary | ICD-10-CM | POA: Insufficient documentation

## 2019-09-02 DIAGNOSIS — Z9884 Bariatric surgery status: Secondary | ICD-10-CM | POA: Insufficient documentation

## 2019-09-02 DIAGNOSIS — Z86711 Personal history of pulmonary embolism: Secondary | ICD-10-CM | POA: Insufficient documentation

## 2019-09-02 DIAGNOSIS — I2510 Atherosclerotic heart disease of native coronary artery without angina pectoris: Secondary | ICD-10-CM | POA: Insufficient documentation

## 2019-09-02 DIAGNOSIS — I272 Pulmonary hypertension, unspecified: Secondary | ICD-10-CM | POA: Insufficient documentation

## 2019-09-02 DIAGNOSIS — Z823 Family history of stroke: Secondary | ICD-10-CM | POA: Insufficient documentation

## 2019-09-02 DIAGNOSIS — E538 Deficiency of other specified B group vitamins: Secondary | ICD-10-CM | POA: Insufficient documentation

## 2019-09-02 LAB — CBC WITH DIFFERENTIAL/PLATELET
Abs Immature Granulocytes: 0.02 10*3/uL (ref 0.00–0.07)
Basophils Absolute: 0 10*3/uL (ref 0.0–0.1)
Basophils Relative: 0 %
Eosinophils Absolute: 0.1 10*3/uL (ref 0.0–0.5)
Eosinophils Relative: 1 %
HCT: 37.2 % (ref 36.0–46.0)
Hemoglobin: 12.3 g/dL (ref 12.0–15.0)
Immature Granulocytes: 0 %
Lymphocytes Relative: 38 %
Lymphs Abs: 2.4 10*3/uL (ref 0.7–4.0)
MCH: 28.8 pg (ref 26.0–34.0)
MCHC: 33.1 g/dL (ref 30.0–36.0)
MCV: 87.1 fL (ref 80.0–100.0)
Monocytes Absolute: 0.5 10*3/uL (ref 0.1–1.0)
Monocytes Relative: 8 %
Neutro Abs: 3.3 10*3/uL (ref 1.7–7.7)
Neutrophils Relative %: 53 %
Platelets: 302 10*3/uL (ref 150–400)
RBC: 4.27 MIL/uL (ref 3.87–5.11)
RDW: 14.4 % (ref 11.5–15.5)
WBC: 6.3 10*3/uL (ref 4.0–10.5)
nRBC: 0 % (ref 0.0–0.2)

## 2019-09-02 LAB — IRON AND TIBC
Iron: 69 ug/dL (ref 28–170)
Saturation Ratios: 23 % (ref 10.4–31.8)
TIBC: 302 ug/dL (ref 250–450)
UIBC: 233 ug/dL

## 2019-09-02 LAB — VITAMIN B12: Vitamin B-12: 343 pg/mL (ref 180–914)

## 2019-09-02 LAB — FERRITIN: Ferritin: 63 ng/mL (ref 11–307)

## 2019-09-04 ENCOUNTER — Encounter: Payer: Self-pay | Admitting: Oncology

## 2019-09-04 ENCOUNTER — Inpatient Hospital Stay (HOSPITAL_BASED_OUTPATIENT_CLINIC_OR_DEPARTMENT_OTHER): Payer: BC Managed Care – PPO | Admitting: Oncology

## 2019-09-04 ENCOUNTER — Inpatient Hospital Stay: Payer: BC Managed Care – PPO

## 2019-09-04 ENCOUNTER — Other Ambulatory Visit: Payer: Self-pay

## 2019-09-04 VITALS — BP 141/94 | HR 68 | Temp 96.7°F | Resp 18 | Wt 296.4 lb

## 2019-09-04 DIAGNOSIS — E538 Deficiency of other specified B group vitamins: Secondary | ICD-10-CM | POA: Diagnosis not present

## 2019-09-04 DIAGNOSIS — Z9884 Bariatric surgery status: Secondary | ICD-10-CM

## 2019-09-04 DIAGNOSIS — I2699 Other pulmonary embolism without acute cor pulmonale: Secondary | ICD-10-CM | POA: Diagnosis not present

## 2019-09-04 DIAGNOSIS — D500 Iron deficiency anemia secondary to blood loss (chronic): Secondary | ICD-10-CM

## 2019-09-04 DIAGNOSIS — Z86711 Personal history of pulmonary embolism: Secondary | ICD-10-CM | POA: Diagnosis not present

## 2019-09-04 MED ORDER — CYANOCOBALAMIN 1000 MCG/ML IJ SOLN
1000.0000 ug | Freq: Once | INTRAMUSCULAR | Status: AC
  Administered 2019-09-04: 1000 ug via INTRAMUSCULAR
  Filled 2019-09-04: qty 1

## 2019-09-04 MED ORDER — APIXABAN 2.5 MG PO TABS: 2.5000 mg | ORAL_TABLET | Freq: Two times a day (BID) | ORAL | 5 refills | Status: DC
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
## 2019-09-04 NOTE — Progress Notes (Signed)
Hematology/Oncology Follow Up Note [facility]  Telephone:([phone] [phone]  Patient Care Team: Glendale Chard, MD as PCP - General (Internal Medicine) Earlie Server, MD as Consulting Physician (Oncology)   Name of the patient: Kristen [name]  [id number]  20-Aug-1965   REASON FOR VISIT  follow-up for management of pulmonary embolism  PERTINENT HEMATOLOGY HISTORY Kristen [name] is a 54 y.o.afemale who has above oncology history reviewed by me today presented for follow up visit for management of pulmonary embolism.  Patient was seen by me during hospitalization.. Patient presents to ER for evaluation of syncope episode. Patient fainted but did not fall or hurt herself as family member caught patient.  EKG in ED showed ST changed. Troponin elevated and started on heparin drip and admitted.  On day 1 of admission, CODE BLUE was called and patient was unresponsive and received brief chest compression. Patient woke up and talked.  No cardiac arrest.  She reports feeling dizzy and lightheaded and passed out.  Similar to the syncope episode prior to admission. Cardiac catheterization showed normal LV systolic function and hyperdynamic in nature with ejection fraction of 70% consistent with hyper trophic cardiomyopathy.  Significantly elevated pulmonary pressure possibly consistent with pulmonary embolism.  No cardiac intervention due to normal coronary arteries. Patient had VQ scan on 02/15/2018 which showed hypermobility for pulmonary embolism Patient was on heparin drip and was transitioned to Eliquis 10 mg twice daily started on 02/16/2018.  # PE finished 6 months of Eliquis 5m BID.  # History of gastric sleeve  INTERVAL HISTORY Kristen D WWilbonis a 54y.o.female who has above oncology history reviewed by me today presented for follow up visit for management of pulmonary embolism, iron deficiency, vitamin B12 deficiency.  Patient reports no new  complaints. SHe has been on Eliquis 2.5 mg twice daily.  She tolerates well.  Denies any bleeding events. Denies any constitutional symptoms. Patient has been on vitamin B12 injection monthly.  Review of Systems  Constitutional: Negative for appetite change, chills, fatigue and fever.  HENT:   Negative for hearing loss and voice change.   Eyes: Negative for eye problems.  Respiratory: Negative for chest tightness, cough and shortness of breath.   Cardiovascular: Negative for chest pain.  Gastrointestinal: Negative for abdominal distention, abdominal pain and blood in stool.  Endocrine: Negative for hot flashes.  Genitourinary: Negative for difficulty urinating and frequency.   Musculoskeletal: Negative for arthralgias.  Skin: Negative for itching and rash.  Neurological: Negative for extremity weakness.  Hematological: Negative for adenopathy.  Psychiatric/Behavioral: Negative for confusion.      Allergies  Allergen Reactions  . Amoxicillin Hives    Has patient had a PCN reaction causing immediate rash, facial/tongue/throat swelling, SOB or lightheadedness with hypotension: No Has patient had a PCN reaction causing severe rash involving mucus membranes or skin necrosis: Yes Has patient had a PCN reaction that required hospitalization No Has patient had a PCN reaction occurring within the last 10 years: No If all of the above answers are "NO", then may proceed with Cephalosporin use.   . Fish Allergy Hives     Past Medical History:  Diagnosis Date  . Arthritis   . [place]    Type 2  . Hypertension   . Iron deficiency anemia 03/07/2018  . URI (upper respiratory infection)    took antibiotic and prednisone oct 2019 resolved  . Uses contact lenses      Past Surgical History:  Procedure  Laterality Date  . BREAST BIOPSY Right 2015  . COLONOSCOPY WITH PROPOFOL N/A 03/18/2016   Procedure: COLONOSCOPY WITH PROPOFOL;  Surgeon: Carol Ada, MD;  Location: [facility];   Service: Endoscopy;  Laterality: N/A;  . LAPAROSCOPIC GASTRIC SLEEVE RESECTION N/A 01/29/2018   Procedure: LAPAROSCOPIC GASTRIC SLEEVE RESECTION WITH UPPER ENDO AND ERAS PATHWAY;  Surgeon: Kinsinger, Arta Bruce, MD;  Location: [facility];  Service: General;  Laterality: N/A;  . RIGHT/LEFT HEART CATH AND CORONARY ANGIOGRAPHY N/A 02/15/2018   Procedure: RIGHT/LEFT HEART CATH AND CORONARY ANGIOGRAPHY;  Surgeon: Corey Skains, MD;  Location: [facility];  Service: Cardiovascular;  Laterality: N/A;  . TOTAL HIP ARTHROPLASTY Right 08/04/2017   Procedure: RIGHT TOTAL HIP ARTHROPLASTY ANTERIOR APPROACH;  Surgeon: Mcarthur Rossetti, MD;  Location: [facility];  Service: Orthopedics;  Laterality: Right;    Social History   Socioeconomic History  . Marital status: Married    Spouse name: Not on file  . Number of children: Not on file  . Years of education: Not on file  . Highest education level: Not on file  Occupational History  . Not on file  Tobacco Use  . Smoking status: Never Smoker  . Smokeless tobacco: Never Used  Vaping Use  . Vaping Use: Never used  Substance and Sexual Activity  . Alcohol use: Not Currently    Comment: OCCASIONAL   . Drug use: No  . Sexual activity: Yes    Birth control/protection: None  Other Topics Concern  . Not on file  Social History Narrative  . Not on file   Social Determinants of Health   Financial Resource Strain:   . Difficulty of Paying Living Expenses:   Food Insecurity:   . Worried About Charity fundraiser in the Last Year:   . Arboriculturist in the Last Year:   Transportation Needs:   . Film/video editor (Medical):   [name] Lack of Transportation (Non-Medical):   Physical Activity:   . Days of Exercise per Week:   . Minutes of Exercise per Session:   Stress:   . Feeling of Stress :   Social Connections:   . Frequency of Communication with Friends and Family:   . Frequency of Social Gatherings with Friends and Family:   .  Attends Religious Services:   . Active Member of Clubs or Organizations:   . Attends Archivist Meetings:   [name] Marital Status:   Intimate Partner Violence:   . Fear of Current or Ex-Partner:   . Emotionally Abused:   [name] Physically Abused:   . Sexually Abused:     Family History  Problem Relation Age of Onset  . Hypertension Mother   . Diabetes Mother   . Hypertension Father   . Diabetes Father   . COPD Father   . Stroke Father   . Congestive Heart Failure Father   . Diabetes Sister   . Hypertension Sister      Current Outpatient Medications:  .  acetaminophen (TYLENOL) 500 MG tablet, Take 1 tablet (500 mg total) by mouth every 6 (six) hours as needed., Disp: 30 tablet, Rfl: 0 .  albuterol (PROVENTIL HFA;VENTOLIN HFA) 108 (90 Base) MCG/ACT inhaler, Inhale 1-2 puffs into the lungs every 6 (six) hours as needed for wheezing or shortness of breath., Disp: 1 Inhaler, Rfl: 2 .  blood glucose meter kit and supplies KIT, Dispense based on patient and insurance preference. Use up to four times daily as directed. (  FOR ICD-9 250.00, 250.01)., Disp: 1 each, Rfl: 0 .  cetirizine (ZYRTEC) 10 MG tablet, Take 10 mg by mouth daily., Disp: , Rfl:  .  cyanocobalamin (,VITAMIN B-12,) 1000 MCG/ML injection, Inject into the muscle., Disp: , Rfl:  .  docusate sodium (COLACE) 100 MG capsule, Take 100 mg by mouth 2 (two) times daily., Disp: , Rfl:  .  ELIQUIS 2.5 MG TABS tablet, TAKE 1 TABLET BY MOUTH TWICE A DAY, Disp: 60 tablet, Rfl: 3 .  FLUoxetine (PROZAC) 20 MG capsule, Take 1 capsule (20 mg total) by mouth daily., Disp: 90 capsule, Rfl: 1 .  NONFORMULARY OR COMPOUNDED ITEM, Iron patch apply every 8 hours, Disp: , Rfl:  .  olmesartan (BENICAR) 40 MG tablet, Take 40 mg by mouth daily., Disp: , Rfl:  .  metFORMIN (GLUCOPHAGE-XR) 500 MG 24 hr tablet, TAKE 1 TABLET BY MOUTH EVERY DAY (Patient not taking: Reported on 09/04/2019), Disp: 90 tablet, Rfl: 1  Physical exam:  Vitals:   09/04/19 1314   BP: (!) 141/94  Pulse: 68  Resp: 18  Temp: (!) 96.7 F (35.9 C)  Weight: 296 lb 6.4 oz (134.4 kg)   Physical Exam Constitutional:      General: She is not in acute distress.    Comments: Morbidly obese.   HENT:     Head: Normocephalic and atraumatic.     Comments: Chronic alopecia Eyes:     General: No scleral icterus.    Pupils: Pupils are equal, round, and reactive to light.  Cardiovascular:     Rate and Rhythm: Normal rate and regular rhythm.     Heart sounds: Normal heart sounds.  Pulmonary:     Effort: Pulmonary effort is normal. No respiratory distress.     Breath sounds: No wheezing.  Abdominal:     General: Bowel sounds are normal. There is no distension.     Palpations: Abdomen is soft. There is no mass.     Tenderness: There is no abdominal tenderness.  Musculoskeletal:        General: No deformity. Normal range of motion.     Cervical back: Normal range of motion and neck supple.  Skin:    General: Skin is warm and dry.     Findings: No erythema or rash.  Neurological:     Mental Status: She is alert and oriented to person, place, and time.     Cranial Nerves: No cranial nerve deficit.     Coordination: Coordination normal.  Psychiatric:        Behavior: Behavior normal.        Thought Content: Thought content normal.     CMP Latest Ref Rng & Units 03/12/2019  Glucose 65 - 99 mg/dL 130(H)  BUN 6 - 24 mg/dL 18  Creatinine 0.57 - 1.00 mg/dL 0.99  Sodium 134 - 144 mmol/L 142  Potassium 3.5 - 5.2 mmol/L 5.1  Chloride 96 - 106 mmol/L 107(H)  CO2 20 - 29 mmol/L 24  Calcium 8.7 - 10.2 mg/dL 9.3  Total Protein 6.5 - 8.1 g/dL -  Total Bilirubin 0.3 - 1.2 mg/dL -  Alkaline Phos 38 - 126 U/L -  AST 15 - 41 U/L -  ALT 0 - 44 U/L -   CBC Latest Ref Rng & Units 09/02/2019  WBC 4.0 - 10.5 K/uL 6.3  Hemoglobin 12.0 - 15.0 g/dL 12.3  Hematocrit 36 - 46 % 37.2  Platelets 150 - 400 K/uL 302   RADIOGRAPHIC STUDIES: I have personally  reviewed the radiological  images as listed and agreed with the findings in the report. No results found.   Assessment and plan Patient is a 54 y.o. female female with history of morbid obesity status post laparoscopic sleeve gastrectomy on 01/29/2018, diabetes, hypertension, presented to the emergency room for evaluation of syncope episodes.  Status post cardiac cath which showed increased pulmonary hypertension.  Normal coronary artery disease.  VQ scan showed high probability of PE.  1. Iron deficiency anemia due to chronic blood loss   2. Vitamin B12 deficiency   3. H/O bariatric surgery   4. History of pulmonary embolism     #Pulmonary embolism, continue Eliquis 2.45m BID.  She tolerates well. I reviewed 6 months supply.  # Iron deficiency anemia, in the context of gastric sleeve Labs are reviewed and discussed with patient.  Counts are stable. Normal hemoglobin level at 12.3, stable iron panel.  Ferritin 63.  Continue to monitor.  No need for IV Venofer today.  #Vitamin B12 deficiency.  Continue monthly vitamin b12 10067m  #History of bariatric surgery/gastric sleeve.  At the risk of vitamin deficiency. Monitor.  # mammogram images were reviewed and discussed We spent sufficient time to discuss many aspect of care, questions were answered to patient's satisfaction.  Orders Placed This Encounter  Procedures  . CBC with Differential/Platelet    Standing Status:   Future    Standing Expiration Date:   09/03/2020  . Comprehensive metabolic panel    Standing Status:   Future    Standing Expiration Date:   09/03/2020  . Ferritin    Standing Status:   Future    Standing Expiration Date:   09/03/2020  . Iron and TIBC    Standing Status:   Future    Standing Expiration Date:   09/03/2020  . Folate    Standing Status:   Future    Standing Expiration Date:   09/03/2020  . Vitamin B12    Standing Status:   Future    Standing Expiration Date:   09/03/2020  . Copper, serum    Standing Status:   Future     Standing Expiration Date:   09/03/2020  . Zinc    Standing Status:   Future    Standing Expiration Date:   09/03/2020     RTC in 6 months.     ZhEarlie ServerMD, PhD Hematology Oncology [facility]/21/2021

## 2019-09-05 ENCOUNTER — Other Ambulatory Visit: Payer: Self-pay

## 2019-09-05 ENCOUNTER — Encounter: Payer: Self-pay | Admitting: Nurse Practitioner

## 2019-09-05 MED ORDER — RYBELSUS 7 MG PO TABS: 7.0000 mg | ORAL_TABLET | Freq: Every day | ORAL | 0 refills | Status: DC

## 2019-09-06 ENCOUNTER — Ambulatory Visit: Payer: BC Managed Care – PPO

## 2019-09-29 ENCOUNTER — Other Ambulatory Visit: Payer: Self-pay | Admitting: Nurse Practitioner

## 2019-10-07 ENCOUNTER — Inpatient Hospital Stay: Payer: BC Managed Care – PPO | Attending: Oncology

## 2019-10-07 ENCOUNTER — Other Ambulatory Visit: Payer: Self-pay

## 2019-10-07 DIAGNOSIS — E538 Deficiency of other specified B group vitamins: Secondary | ICD-10-CM | POA: Insufficient documentation

## 2019-10-07 DIAGNOSIS — Z86711 Personal history of pulmonary embolism: Secondary | ICD-10-CM | POA: Insufficient documentation

## 2019-10-07 DIAGNOSIS — Z7901 Long term (current) use of anticoagulants: Secondary | ICD-10-CM | POA: Insufficient documentation

## 2019-10-07 DIAGNOSIS — D509 Iron deficiency anemia, unspecified: Secondary | ICD-10-CM | POA: Diagnosis present

## 2019-10-07 DIAGNOSIS — Z9884 Bariatric surgery status: Secondary | ICD-10-CM | POA: Diagnosis not present

## 2019-10-07 DIAGNOSIS — D500 Iron deficiency anemia secondary to blood loss (chronic): Secondary | ICD-10-CM

## 2019-10-07 MED ORDER — CYANOCOBALAMIN 1000 MCG/ML IJ SOLN
1000.0000 ug | Freq: Once | INTRAMUSCULAR | Status: AC
  Administered 2019-10-07: 1000 ug via INTRAMUSCULAR
  Filled 2019-10-07: qty 1

## 2019-10-09 ENCOUNTER — Other Ambulatory Visit: Payer: Self-pay

## 2019-10-09 ENCOUNTER — Ambulatory Visit (INDEPENDENT_AMBULATORY_CARE_PROVIDER_SITE_OTHER): Payer: BC Managed Care – PPO | Admitting: Internal Medicine

## 2019-10-09 ENCOUNTER — Encounter: Payer: Self-pay | Admitting: Internal Medicine

## 2019-10-09 VITALS — BP 140/88 | HR 93 | Temp 97.8°F | Ht 64.5 in | Wt 288.4 lb

## 2019-10-09 DIAGNOSIS — I10 Essential (primary) hypertension: Secondary | ICD-10-CM

## 2019-10-09 DIAGNOSIS — R0683 Snoring: Secondary | ICD-10-CM | POA: Diagnosis not present

## 2019-10-09 DIAGNOSIS — E119 Type 2 diabetes mellitus without complications: Secondary | ICD-10-CM | POA: Diagnosis not present

## 2019-10-09 DIAGNOSIS — Z6841 Body Mass Index (BMI) 40.0 and over, adult: Secondary | ICD-10-CM

## 2019-10-09 DIAGNOSIS — Z0000 Encounter for general adult medical examination without abnormal findings: Secondary | ICD-10-CM | POA: Diagnosis not present

## 2019-10-09 DIAGNOSIS — E559 Vitamin D deficiency, unspecified: Secondary | ICD-10-CM

## 2019-10-09 DIAGNOSIS — I272 Pulmonary hypertension, unspecified: Secondary | ICD-10-CM | POA: Diagnosis not present

## 2019-10-09 LAB — POCT URINALYSIS DIPSTICK
Bilirubin, UA: NEGATIVE
Blood, UA: NEGATIVE
Glucose, UA: NEGATIVE
Ketones, UA: NEGATIVE
Nitrite, UA: NEGATIVE
Protein, UA: NEGATIVE
Spec Grav, UA: 1.025 (ref 1.010–1.025)
Urobilinogen, UA: 0.2 U/dL
pH, UA: 5.5 (ref 5.0–8.0)

## 2019-10-09 LAB — POCT UA - MICROALBUMIN
Albumin/Creatinine Ratio, Urine, POC: <30
Creatinine, POC: 300 mg/dL
Microalbumin Ur, POC: 80 mg/L

## 2019-10-09 NOTE — Patient Instructions (Signed)
Health Maintenance, Female Adopting a healthy lifestyle and getting preventive care are important in promoting health and wellness. Ask your health care provider about:  The right schedule for you to have regular tests and exams.  Things you can do on your own to prevent diseases and keep yourself healthy. What should I know about diet, weight, and exercise? Eat a healthy diet   Eat a diet that includes plenty of vegetables, fruits, low-fat dairy products, and lean protein.  Do not eat a lot of foods that are high in solid fats, added sugars, or sodium. Maintain a healthy weight Body mass index (BMI) is used to identify weight problems. It estimates body fat based on height and weight. Your health care provider can help determine your BMI and help you achieve or maintain a healthy weight. Get regular exercise Get regular exercise. This is one of the most important things you can do for your health. Most adults should:  Exercise for at least 150 minutes each week. The exercise should increase your heart rate and make you sweat (moderate-intensity exercise).  Do strengthening exercises at least twice a week. This is in addition to the moderate-intensity exercise.  Spend less time sitting. Even light physical activity can be beneficial. Watch cholesterol and blood lipids Have your blood tested for lipids and cholesterol at 54 years of age, then have this test every 5 years. Have your cholesterol levels checked more often if:  Your lipid or cholesterol levels are high.  You are older than 54 years of age.  You are at high risk for heart disease. What should I know about cancer screening? Depending on your health history and family history, you may need to have cancer screening at various ages. This may include screening for:  Breast cancer.  Cervical cancer.  Colorectal cancer.  Skin cancer.  Lung cancer. What should I know about heart disease, diabetes, and high blood  pressure? Blood pressure and heart disease  High blood pressure causes heart disease and increases the risk of stroke. This is more likely to develop in people who have high blood pressure readings, are of African descent, or are overweight.  Have your blood pressure checked: ? Every 3-5 years if you are 18-39 years of age. ? Every year if you are 40 years old or older. Diabetes Have regular diabetes screenings. This checks your fasting blood sugar level. Have the screening done:  Once every three years after age 40 if you are at a normal weight and have a low risk for diabetes.  More often and at a younger age if you are overweight or have a high risk for diabetes. What should I know about preventing infection? Hepatitis B If you have a higher risk for hepatitis B, you should be screened for this virus. Talk with your health care provider to find out if you are at risk for hepatitis B infection. Hepatitis C Testing is recommended for:  Everyone born from 1945 through 1965.  Anyone with known risk factors for hepatitis C. Sexually transmitted infections (STIs)  Get screened for STIs, including gonorrhea and chlamydia, if: ? You are sexually active and are younger than 54 years of age. ? You are older than 54 years of age and your health care provider tells you that you are at risk for this type of infection. ? Your sexual activity has changed since you were last screened, and you are at increased risk for chlamydia or gonorrhea. Ask your health care provider if   you are at risk.  Ask your health care provider about whether you are at high risk for HIV. Your health care provider may recommend a prescription medicine to help prevent HIV infection. If you choose to take medicine to prevent HIV, you should first get tested for HIV. You should then be tested every 3 months for as long as you are taking the medicine. Pregnancy  If you are about to stop having your period (premenopausal) and  you may become pregnant, seek counseling before you get pregnant.  Take 400 to 800 micrograms (mcg) of folic acid every day if you become pregnant.  Ask for birth control (contraception) if you want to prevent pregnancy. Osteoporosis and menopause Osteoporosis is a disease in which the bones lose minerals and strength with aging. This can result in bone fractures. If you are 65 years old or older, or if you are at risk for osteoporosis and fractures, ask your health care provider if you should:  Be screened for bone loss.  Take a calcium or vitamin D supplement to lower your risk of fractures.  Be given hormone replacement therapy (HRT) to treat symptoms of menopause. Follow these instructions at home: Lifestyle  Do not use any products that contain nicotine or tobacco, such as cigarettes, e-cigarettes, and chewing tobacco. If you need help quitting, ask your health care provider.  Do not use street drugs.  Do not share needles.  Ask your health care provider for help if you need support or information about quitting drugs. Alcohol use  Do not drink alcohol if: ? Your health care provider tells you not to drink. ? You are pregnant, may be pregnant, or are planning to become pregnant.  If you drink alcohol: ? Limit how much you use to 0-1 drink a day. ? Limit intake if you are breastfeeding.  Be aware of how much alcohol is in your drink. In the [place] one drink equals one 12 oz bottle of beer (355 mL), one 5 oz glass of wine (148 mL), or one 1 oz glass of hard liquor (44 mL). General instructions  Schedule regular health, dental, and eye exams.  Stay current with your vaccines.  Tell your health care provider if: ? You often feel depressed. ? You have ever been abused or do not feel safe at home. Summary  Adopting a healthy lifestyle and getting preventive care are important in promoting health and wellness.  Follow your health care provider's instructions about healthy  diet, exercising, and getting tested or screened for diseases.  Follow your health care provider's instructions on monitoring your cholesterol and blood pressure. This information is not intended to replace advice given to you by your health care provider. Make sure you discuss any questions you have with your health care provider. Document Revised: 01/24/2018 Document Reviewed: 01/24/2018 Elsevier Patient Education  2020 [employer]  

## 2019-10-09 NOTE — Progress Notes (Signed)
I,[name],acting as a Education administrator for Maximino Greenland, MD.,have documented all relevant documentation on the behalf of Maximino Greenland, MD,as directed by  Maximino Greenland, MD while in the presence of Maximino Greenland, MD.  This visit occurred during the SARS-CoV-2 public health emergency.  Safety protocols were in place, including screening questions prior to the visit, additional usage of staff PPE, and extensive cleaning of exam room while observing appropriate contact time as indicated for disinfecting solutions.  Subjective:     Patient ID: Kristen [name] , female    DOB: May 31, 1965 , 54 y.o.   MRN: [id number]   Chief Complaint  Patient presents with  . Annual Exam  . Diabetes  . Hypertension    HPI  Patient is here for physical exam. She is followed by Dr Edilia Bo at [facility]. Her last PAP was September 2020. She has been taking Rybelsus without any issues.  She has no concerns at this time.   Diabetes She presents for her follow-up diabetic visit. She has type 2 diabetes mellitus. There are no hypoglycemic associated symptoms. Pertinent negatives for hypoglycemia include no dizziness or headaches. Pertinent negatives for diabetes include no chest pain. There are no hypoglycemic complications. Symptoms are stable. There are no diabetic complications. Risk factors for coronary artery disease include obesity and sedentary lifestyle. She is compliant with treatment all of the time. She participates in exercise intermittently. Her breakfast blood glucose is taken between 8-9 am. Her breakfast blood glucose range is generally 90-110 mg/dl. Eye exam is current.  Hypertension This is a chronic problem. The current episode started more than 1 year ago. The problem has been gradually improving since onset. The problem is controlled. Pertinent negatives include no anxiety, chest pain, headaches or palpitations. There are no known risk factors for coronary artery disease. Past treatments  include nothing. The current treatment provides no improvement. There are no compliance problems.  There is no history of angina. There is no history of chronic renal disease.     Past Medical History:  Diagnosis Date  . Arthritis   . Diabetes ([place])    Type 2  . Hypertension   . Iron deficiency anemia 03/07/2018  . URI (upper respiratory infection)    took antibiotic and prednisone oct 2019 resolved  . Uses contact lenses      Family History  Problem Relation Age of Onset  . Hypertension Mother   . Diabetes Mother   . Hypertension Father   . Diabetes Father   . COPD Father   . Stroke Father   . Congestive Heart Failure Father   . Diabetes Sister   . Hypertension Sister      Current Outpatient Medications:  .  acetaminophen (TYLENOL) 500 MG tablet, Take 1 tablet (500 mg total) by mouth every 6 (six) hours as needed., Disp: 30 tablet, Rfl: 0 .  albuterol (PROVENTIL HFA;VENTOLIN HFA) 108 (90 Base) MCG/ACT inhaler, Inhale 1-2 puffs into the lungs every 6 (six) hours as needed for wheezing or shortness of breath., Disp: 1 Inhaler, Rfl: 2 .  apixaban (ELIQUIS) 2.5 MG TABS tablet, Take 1 tablet (2.5 mg total) by mouth 2 (two) times daily., Disp: 60 tablet, Rfl: 5 .  blood glucose meter kit and supplies KIT, Dispense based on patient and insurance preference. Use up to four times daily as directed. (FOR ICD-9 250.00, 250.01)., Disp: 1 each, Rfl: 0 .  cetirizine (ZYRTEC) 10 MG tablet, Take 10 mg by mouth daily., Disp: ,  Rfl:  .  cyanocobalamin (,VITAMIN B-12,) 1000 MCG/ML injection, Inject into the muscle., Disp: , Rfl:  .  docusate sodium (COLACE) 100 MG capsule, Take 100 mg by mouth 2 (two) times daily., Disp: , Rfl:  .  FLUoxetine (PROZAC) 20 MG capsule, Take 1 capsule (20 mg total) by mouth daily., Disp: 90 capsule, Rfl: 1 .  metFORMIN (GLUCOPHAGE-XR) 500 MG 24 hr tablet, TAKE 1 TABLET BY MOUTH EVERY DAY, Disp: 90 tablet, Rfl: 1 .  NONFORMULARY OR COMPOUNDED ITEM, Iron patch apply  every 8 hours, Disp: , Rfl:  .  olmesartan (BENICAR) 40 MG tablet, Take 40 mg by mouth daily., Disp: , Rfl:  .  RYBELSUS 7 MG TABS, TAKE 1 TABLET BY MOUTH DAILY. TAKE 30 MINUTES BEFORE BREAKFAST., Disp: 30 tablet, Rfl: 0 .  atorvastatin (LIPITOR) 20 MG tablet, Take 1 tablet (20 mg total) by mouth daily., Disp: 30 tablet, Rfl: 11 .  Vitamin D, Ergocalciferol, (DRISDOL) 1.25 MG (50000 UNIT) CAPS capsule, Take one capsule po twice weekly on Tues/Fridays, Disp: 24 capsule, Rfl: 1   Allergies  Allergen Reactions  . Amoxicillin Hives    Has patient had a PCN reaction causing immediate rash, facial/tongue/throat swelling, SOB or lightheadedness with hypotension: No Has patient had a PCN reaction causing severe rash involving mucus membranes or skin necrosis: Yes Has patient had a PCN reaction that required hospitalization No Has patient had a PCN reaction occurring within the last 10 years: No If all of the above answers are "NO", then may proceed with Cephalosporin use.   . Fish Allergy Hives      The patient states she uses none for birth control. Last LMP was No LMP recorded. (Menstrual status: Irregular Periods).. Negative for Dysmenorrhea. Negative for: breast discharge, breast lump(s), breast pain and breast self exam. Associated symptoms include abnormal vaginal bleeding. Pertinent negatives include abnormal bleeding (hematology), anxiety, decreased libido, depression, difficulty falling sleep, dyspareunia, history of infertility, nocturia, sexual dysfunction, sleep disturbances, urinary incontinence, urinary urgency, vaginal discharge and vaginal itching. Diet regular.The patient states her exercise level is    . The patient's tobacco use is:  Social History   Tobacco Use  Smoking Status Never Smoker  Smokeless Tobacco Never Used  . She has been exposed to passive smoke. The patient's alcohol use is:  Social History   Substance and Sexual Activity  Alcohol Use Not Currently   Comment:  OCCASIONAL     Review of Systems  Constitutional: Negative.   HENT: Negative.   Eyes: Negative.   Respiratory: Negative.   Cardiovascular: Negative.  Negative for chest pain and palpitations.  Gastrointestinal: Negative.   Endocrine: Negative.   Genitourinary: Negative.   Musculoskeletal: Negative.   Skin: Negative.   Allergic/Immunologic: Negative.   Neurological: Negative.  Negative for dizziness and headaches.  Hematological: Negative.   Psychiatric/Behavioral: Negative.      Today's Vitals   10/09/19 1505  BP: 140/88  Pulse: 93  Temp: 97.8 F (36.6 C)  TempSrc: Oral  Weight: 288 lb 6.4 oz (130.8 kg)  Height: 5' 4.5" (1.638 m)   Body mass index is 48.74 kg/m.   Objective:  Physical Exam Constitutional:      General: She is not in acute distress.    Appearance: Normal appearance. She is well-developed. She is obese.  HENT:     Head: Normocephalic and atraumatic.     Right Ear: Hearing, tympanic membrane, ear canal and external ear normal. There is no impacted cerumen.  Left Ear: Hearing, tympanic membrane, ear canal and external ear normal. There is no impacted cerumen.     Nose:     Comments: Deferred, masked    Mouth/Throat:     Comments: deferred Eyes:     General: Lids are normal.     Extraocular Movements: Extraocular movements intact.     Conjunctiva/sclera: Conjunctivae normal.     Pupils: Pupils are equal, round, and reactive to light.     Funduscopic exam:    Right eye: No papilledema.        Left eye: No papilledema.  Neck:     Thyroid: No thyroid mass.     Vascular: No carotid bruit.  Cardiovascular:     Rate and Rhythm: Normal rate and regular rhythm.     Pulses: Normal pulses.          Dorsalis pedis pulses are 2+ on the right side and 2+ on the left side.     Heart sounds: Normal heart sounds. No murmur heard.   Pulmonary:     Effort: Pulmonary effort is normal.     Breath sounds: Normal breath sounds.  Chest:     Breasts: Tanner  Score is 5.        Right: Normal.        Left: Normal.  Abdominal:     General: Bowel sounds are normal. There is no distension.     Palpations: Abdomen is soft.     Tenderness: There is no abdominal tenderness.     Comments: Obese, soft. Difficult to assess organomegaly.  Musculoskeletal:        General: No swelling. Normal range of motion.     Cervical back: Full passive range of motion without pain, normal range of motion and neck supple.     Right lower leg: No edema.     Left lower leg: No edema.  Feet:     Right foot:     Protective Sensation: 5 sites tested. 5 sites sensed.     Skin integrity: Callus and dry skin present.     Toenail Condition: Right toenails are normal.     Left foot:     Protective Sensation: 5 sites tested. 5 sites sensed.     Skin integrity: Callus and dry skin present.     Toenail Condition: Left toenails are normal.  Skin:    General: Skin is warm and dry.     Capillary Refill: Capillary refill takes less than 2 seconds.  Neurological:     General: No focal deficit present.     Mental Status: She is alert and oriented to person, place, and time.     Cranial Nerves: No cranial nerve deficit.     Sensory: No sensory deficit.  Psychiatric:        Mood and Affect: Mood normal.        Behavior: Behavior normal.        Thought Content: Thought content normal.        Judgment: Judgment normal.         Assessment And Plan:     1. Health maintenance examination Comments: A full exam was performed. Importance of monthly self breast exams was discussed with the patient. She is scheduled to see GYN Sept 2021. PATIENT IS ADVISED TO GET 30-45 MINUTES REGULAR EXERCISE NO LESS THAN FOUR TO FIVE DAYS PER WEEK - BOTH WEIGHTBEARING EXERCISES AND AEROBIC ARE RECOMMENDED.  PATIENT IS ADVISED TO FOLLOW A HEALTHY DIET WITH AT  LEAST SIX FRUITS/VEGGIES PER DAY, DECREASE INTAKE OF RED MEAT, AND TO INCREASE FISH INTAKE TO TWO DAYS PER WEEK.  MEATS/FISH SHOULD NOT BE  FRIED, BAKED OR BROILED IS PREFERABLE.  I SUGGEST WEARING SPF 50 SUNSCREEN ON EXPOSED PARTS AND ESPECIALLY WHEN IN THE DIRECT SUNLIGHT FOR AN EXTENDED PERIOD OF TIME.  PLEASE AVOID FAST FOOD RESTAURANTS AND INCREASE YOUR WATER INTAKE.  - Hemoglobin A1c - CMP14+EGFR - Lipid panel - Hepatitis C antibody - EKG 12-Lead - POCT UA - Microalbumin - POCT urinalysis dipstick  2. Type 2 diabetes mellitus without complication, without long-term current use of insulin (HCC) Comments: Diabetic foot exam was performed. Will continue on current medication and adjust medications as needed per lab results.  She will rto in 3-4 months for re-evaluation.  I DISCUSSED WITH THE PATIENT AT LENGTH REGARDING THE GOALS OF GLYCEMIC CONTROL AND POSSIBLE LONG-TERM COMPLICATIONS.  I  ALSO STRESSED THE IMPORTANCE OF COMPLIANCE WITH HOME GLUCOSE MONITORING, DIETARY RESTRICTIONS INCLUDING AVOIDANCE OF SUGARY DRINKS/PROCESSED FOODS,  ALONG WITH REGULAR EXERCISE.  I  ALSO STRESSED THE IMPORTANCE OF ANNUAL EYE EXAMS, SELF FOOT CARE AND COMPLIANCE WITH OFFICE VISITS.  - POCT UA - Microalbumin - POCT urinalysis dipstick  3. Essential hypertension Comments: Chronic, fair control. She admits she was late taking her medication. EKG performed, NSR w/o acute changes.  She will rto in six months for re-evaluation.  - EKG 12-Lead - POCT UA - Microalbumin - POCT urinalysis dipstick  4. Pulmonary hypertension, unspecified ([place]) Comments: Diagnosed in 2019. Its relationship with untreated OSA was discussed with the patient. Pt advised she would have repeat echo within the next six months.   5. Snoring Comments: She agrees to Neuro referral for sleep study evaluation.  - Ambulatory referral to Neurology  6. Vitamin D deficiency Comments: I will check vitamin D level and supplement as needed.  - VITAMIN D 25 Hydroxy (Vit-D Deficiency, Fractures)  7. Class 3 severe obesity due to excess calories without serious comorbidity with body  mass index (BMI) of 45.0 to 49.9 in adult [facility]) Comments: BMI 48. Encouraged to initially strive for BI less than 40 to decrease cardiac risk. Advised to aim for at least 150 minutes of exercise per week.  She is encouraged to strive for BMI less than 30 to decrease cardiac risk. Advised to aim for at least 150 minutes of exercise per week. Wt Readings from Last 3 Encounters:  10/09/19 288 lb 6.4 oz (130.8 kg)  09/04/19 296 lb 6.4 oz (134.4 kg)  06/25/19 297 lb (134.7 kg)       Patient was given opportunity to ask questions. Patient verbalized understanding of the plan and was able to repeat key elements of the plan. All questions were answered to their satisfaction.   Maximino Greenland, MD   I, Maximino Greenland, MD, have reviewed all documentation for this visit. The documentation on 10/16/19 for the exam, diagnosis, procedures, and orders are all accurate and complete.  THE PATIENT IS ENCOURAGED TO PRACTICE SOCIAL DISTANCING DUE TO THE COVID-19 PANDEMIC.

## 2019-10-10 ENCOUNTER — Other Ambulatory Visit: Payer: Self-pay | Admitting: Internal Medicine

## 2019-10-10 LAB — HEMOGLOBIN A1C
Est. average glucose Bld gHb Est-mCnc: 137 mg/dL
Hgb A1c MFr Bld: 6.4 % — ABNORMAL HIGH (ref 4.8–5.6)

## 2019-10-10 LAB — CMP14+EGFR
ALT: 14 IU/L (ref 0–32)
AST: 20 IU/L (ref 0–40)
Albumin/Globulin Ratio: 1.3 (ref 1.2–2.2)
Albumin: 4.7 g/dL (ref 3.8–4.9)
Alkaline Phosphatase: 101 IU/L (ref 48–121)
BUN/Creatinine Ratio: 17 (ref 9–23)
BUN: 17 mg/dL (ref 6–24)
Bilirubin Total: 0.4 mg/dL (ref 0.0–1.2)
CO2: 24 mmol/L (ref 20–29)
Calcium: 9.6 mg/dL (ref 8.7–10.2)
Chloride: 103 mmol/L (ref 96–106)
Creatinine, Ser: 0.98 mg/dL (ref 0.57–1.00)
GFR calc Af Amer: 76 mL/min/{1.73_m2} (ref >59)
GFR calc non Af Amer: 66 mL/min/{1.73_m2} (ref >59)
Globulin, Total: 3.7 g/dL (ref 1.5–4.5)
Glucose: 97 mg/dL (ref 65–99)
Potassium: 4.8 mmol/L (ref 3.5–5.2)
Sodium: 141 mmol/L (ref 134–144)
Total Protein: 8.4 g/dL (ref 6.0–8.5)

## 2019-10-10 LAB — LIPID PANEL
Chol/HDL Ratio: 4 ratio (ref 0.0–4.4)
Cholesterol, Total: 196 mg/dL (ref 100–199)
HDL: 49 mg/dL (ref >39)
LDL Chol Calc (NIH): 118 mg/dL — ABNORMAL HIGH (ref 0–99)
Triglycerides: 164 mg/dL — ABNORMAL HIGH (ref 0–149)
VLDL Cholesterol Cal: 29 mg/dL (ref 5–40)

## 2019-10-10 LAB — HEPATITIS C ANTIBODY: Hep C Virus Ab: <0.1 s/co ratio (ref 0.0–0.9)

## 2019-10-10 LAB — VITAMIN D 25 HYDROXY (VIT D DEFICIENCY, FRACTURES): Vit D, 25-Hydroxy: 22.1 ng/mL — ABNORMAL LOW (ref 30.0–100.0)

## 2019-10-10 MED ORDER — VITAMIN D (ERGOCALCIFEROL) 1.25 MG (50000 UNIT) PO CAPS: ORAL_CAPSULE | ORAL | 1 refills | Status: DC

## 2019-10-11 ENCOUNTER — Encounter: Payer: Self-pay | Admitting: Internal Medicine

## 2019-10-16 ENCOUNTER — Other Ambulatory Visit: Payer: Self-pay | Admitting: Internal Medicine

## 2019-10-16 MED ORDER — ATORVASTATIN CALCIUM 20 MG PO TABS: 20.0000 mg | ORAL_TABLET | Freq: Every day | ORAL | 11 refills | Status: DC

## 2019-10-22 ENCOUNTER — Other Ambulatory Visit: Payer: Self-pay | Admitting: Internal Medicine

## 2019-10-22 DIAGNOSIS — Z1231 Encounter for screening mammogram for malignant neoplasm of breast: Secondary | ICD-10-CM

## 2019-10-25 ENCOUNTER — Other Ambulatory Visit: Payer: Self-pay | Admitting: Nurse Practitioner

## 2019-10-28 ENCOUNTER — Other Ambulatory Visit: Payer: Self-pay | Admitting: Internal Medicine

## 2019-10-28 MED ORDER — RYBELSUS 14 MG PO TABS: 14.0000 mg | ORAL_TABLET | Freq: Every day | ORAL | 4 refills | Status: DC

## 2019-11-08 ENCOUNTER — Inpatient Hospital Stay: Payer: BC Managed Care – PPO | Attending: Oncology

## 2019-11-08 ENCOUNTER — Other Ambulatory Visit: Payer: Self-pay

## 2019-11-08 DIAGNOSIS — D509 Iron deficiency anemia, unspecified: Secondary | ICD-10-CM | POA: Insufficient documentation

## 2019-11-08 DIAGNOSIS — Z9884 Bariatric surgery status: Secondary | ICD-10-CM | POA: Diagnosis not present

## 2019-11-08 DIAGNOSIS — D500 Iron deficiency anemia secondary to blood loss (chronic): Secondary | ICD-10-CM

## 2019-11-08 DIAGNOSIS — Z79899 Other long term (current) drug therapy: Secondary | ICD-10-CM | POA: Insufficient documentation

## 2019-11-08 DIAGNOSIS — E538 Deficiency of other specified B group vitamins: Secondary | ICD-10-CM | POA: Insufficient documentation

## 2019-11-08 DIAGNOSIS — Z86711 Personal history of pulmonary embolism: Secondary | ICD-10-CM | POA: Insufficient documentation

## 2019-11-08 MED ORDER — CYANOCOBALAMIN 1000 MCG/ML IJ SOLN
1000.0000 ug | Freq: Once | INTRAMUSCULAR | Status: AC
  Administered 2019-11-08: 1000 ug via INTRAMUSCULAR
  Filled 2019-11-08: qty 1

## 2019-11-10 ENCOUNTER — Other Ambulatory Visit: Payer: Self-pay | Admitting: Internal Medicine

## 2019-11-11 ENCOUNTER — Inpatient Hospital Stay: Payer: BC Managed Care – PPO

## 2019-11-20 ENCOUNTER — Encounter: Payer: Self-pay | Admitting: Neurology

## 2019-11-20 ENCOUNTER — Ambulatory Visit (INDEPENDENT_AMBULATORY_CARE_PROVIDER_SITE_OTHER): Payer: BC Managed Care – PPO | Admitting: Neurology

## 2019-11-20 ENCOUNTER — Other Ambulatory Visit: Payer: Self-pay | Admitting: Internal Medicine

## 2019-11-20 VITALS — BP 142/82 | HR 81 | Ht 64.5 in | Wt 287.0 lb

## 2019-11-20 DIAGNOSIS — R55 Syncope and collapse: Secondary | ICD-10-CM | POA: Insufficient documentation

## 2019-11-20 DIAGNOSIS — Z9884 Bariatric surgery status: Secondary | ICD-10-CM

## 2019-11-20 DIAGNOSIS — Z6841 Body Mass Index (BMI) 40.0 and over, adult: Secondary | ICD-10-CM

## 2019-11-20 DIAGNOSIS — I2694 Multiple subsegmental pulmonary emboli without acute cor pulmonale: Secondary | ICD-10-CM | POA: Diagnosis not present

## 2019-11-20 DIAGNOSIS — R0683 Snoring: Secondary | ICD-10-CM | POA: Insufficient documentation

## 2019-11-20 DIAGNOSIS — J852 Abscess of lung without pneumonia: Secondary | ICD-10-CM | POA: Insufficient documentation

## 2019-11-20 NOTE — Progress Notes (Signed)
SLEEP MEDICINE CLINIC    Provider:  Larey Seat, MD  Primary Care Physician:  Kristen [name], [address] [place] [address]     Referring Provider: [place] [name], [address] [place],  [place] [address]       SLEEP MEDICINE CLINIC   Provider: Larey Seat, MD  Primary Care Physician:Sanders, Bailey Mech, [address] [place] [address]    Referring Provider:Sanders, [place], [place] [address] [place],  [place] [address]        Chief Complaint according to patient   Patient presents with:    . New Patient (Initial Visit)    rm 10. presents today to assess OSA still a concern post gastric sleeve surgery in 2019. states she snores but otherwise sleeps average of 6 hrs and feels well rested. never had a SS c/o.    HISTORY OF PRESENT ILLNESS: Kristen D Withersis a 54 y.o.  African American female patientand seen here upon  a referralfor a sleep consultation on 11/20/2019 from Dr Baird Cancer.   Chiefconcernaccording to patient : Kristen [name] reports that she has struggled with obesity for much of her adult life, in 2019 she underwent a gastric sleeve surgery with Dr. Kieth Brightly, unfortunately she developed a pulmonary embolism after the surgery which was confirmed by a VQ scan.  A cardiac cath was negative also she had suffered a non-STEMI myocardial infarction.  This was diagnosed on New Year's Day 2020.  Since [name] sleeve surgery she has slowly but steadily lost weight and gained energy she would not consider herself excessively daytime sleepy or fatigued but she is still snoring ( husband confirmed)  and there is a concern that she may have sleep apnea burdening her pulmonary and cardiac function.  She also has not fully controlled hypertension which may be related to ongoing apnea. She sleeps an optimal time of 6 hours more or less makes her feel worse.   I have the pleasure of seeing  Kristen [name] ,a right -handed  African American female with a possible sleep disorder. She has amedical history of Arthritis, Diabetes ([place]), Hypertension, Iron deficiency anemia (03/07/2018), URI (upper respiratory infection), and gastric sleeve with central [place] surgery 01-30-28 , following this she developed a PE, and NSTEMI cardiac infarction.   The patient didn't have a preoperative sleep study .  Sleeprelevant medical history: Nocturia: none, bariatric surgery, PE, thyroid nodules, non-STEMI , snoring.  Familymedical /sleep history:younger sister on CPAP with OSA,   Social history:Patient is working on her [employer]-  and lives in a household with spouse and has adult children and 2 grandsons. .  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 Pets are not  present. Tobacco use- none . ETOH use ; 2-3 drinks a month, Caffeine intake in form of Coffee( 1 cup in AM ) Soda( /) Tea ( sweet tea at dinner, 3 a week). Regular exercise in form of walking    Hobbies; no time.     Sleep habits are as follows:The patient's dinner time is between 6-7 PM. The patient goes to bed at 12 PM and has no trouble to fall asleep- she continues to sleep for 6 hours, no wake-ups .   The preferred sleep position is left side , with the support of 1 pillow.  Dreams are reportedly rare. 8 AM is the usual rise time. The patient wakes up  with an alarm- hits snooze button twice for 9 minutes each. . She reports  feeling refreshed/  restored in AM, no naps.    Review of Systems: Out of a complete 14 system review, the patient complains of only the following symptoms, and all other reviewed systems are negative.:  Only snoring, but has had HTN spikes in BP, had PE, no SOB.  Status post gastric sleeve   How likely are you to doze in the following situations: 0 = not likely, 1 = slight chance, 2 = moderate chance, 3 = high chance  Sitting and  Reading? Watching Television? Sitting inactive in a public place (theater or meeting)? As a passenger in a car for an hour without a break? Lying down in the afternoon when circumstances permit? Sitting and talking to someone? Sitting quietly after lunch without alcohol? In a car, while stopped for a few minutes in traffic?  Total =6/ 24 points  FSS endorsed at 20/ 63 points.   Social History        Socioeconomic History  . Marital status: Married    Spouse name: Not on file  . Number of children: Not on file  . Years of education: Not on file  . Highest education level: Not on file  Occupational History  . Not on file  Tobacco Use  . Smoking status: Never Smoker  . Smokeless tobacco: Never Used  Vaping Use  . Vaping Use: Never used  Substance and Sexual Activity  . Alcohol use: Not Currently    Comment: OCCASIONAL   . Drug use: No  . Sexual activity: Yes    Birth control/protection: None  Other Topics Concern  . Not on file  Social History Narrative  . Not on file   Social Determinants of Health      Financial Resource Strain:   . Difficulty of Paying Living Expenses: Not on file  Food Insecurity:   . Worried About Charity fundraiser in the Last Year: Not on file  . Ran Out of Food in the Last Year: Not on file  Transportation Needs:   . Lack of Transportation (Medical): Not on file  . Lack of Transportation (Non-Medical): Not on file  Physical Activity:   . Days of Exercise per Week: Not on file  . Minutes of Exercise per Session: Not on file  Stress:   . Feeling of Stress : Not on file  Social Connections:   . Frequency of Communication with Friends and Family: Not on file  . Frequency of Social Gatherings with Friends and Family: Not on file  . Attends Religious Services: Not on file  . Active Member of Clubs or Organizations: Not on file  . Attends Archivist Meetings: Not on file  . Marital Status: Not on file           Family History  Problem Relation Age of Onset  . Hypertension Mother   . Diabetes Mother   . Hypertension Father   . Diabetes Father   . COPD Father   . Stroke Father   . Congestive Heart Failure Father   . Diabetes Sister   . Hypertension Sister         Past Medical History:  Diagnosis Date  . Arthritis   . Diabetes ([place])    Type 2  . Hypertension   . Iron deficiency anemia 03/07/2018  . URI (upper respiratory infection)    took antibiotic and prednisone oct 2019 resolved  .  Uses contact lenses          Past Surgical History:  Procedure Laterality Date  . BREAST BIOPSY Right 2015  . COLONOSCOPY WITH PROPOFOL N/A 03/18/2016   Procedure: COLONOSCOPY WITH PROPOFOL;  Surgeon: Carol Ada, MD;  Location: [facility];  Service: Endoscopy;  Laterality: N/A;  . LAPAROSCOPIC GASTRIC SLEEVE RESECTION N/A 01/29/2018   Procedure: LAPAROSCOPIC GASTRIC SLEEVE RESECTION WITH UPPER ENDO AND ERAS PATHWAY;  Surgeon: Kinsinger, Arta Bruce, MD;  Location: [facility];  Service: General;  Laterality: N/A;  . RIGHT/LEFT HEART CATH AND CORONARY ANGIOGRAPHY N/A 02/15/2018   Procedure: RIGHT/LEFT HEART CATH AND CORONARY ANGIOGRAPHY;  Surgeon: Corey Skains, MD;  Location: [facility];  Service: Cardiovascular;  Laterality: N/A;  . TOTAL HIP ARTHROPLASTY Right 08/04/2017   Procedure: RIGHT TOTAL HIP ARTHROPLASTY ANTERIOR APPROACH;  Surgeon: Mcarthur Rossetti, MD;  Location: [facility];  Service: Orthopedics;  Laterality: Right;           Current Outpatient Medications on File Prior to Visit  Medication Sig Dispense Refill  . acetaminophen (TYLENOL) 500 MG tablet Take 1 tablet (500 mg total) by mouth every 6 (six) hours as needed. 30 tablet 0  . albuterol (PROVENTIL HFA;VENTOLIN HFA) 108 (90 Base) MCG/ACT inhaler Inhale 1-2 puffs into the lungs every 6 (six) hours as needed for wheezing or shortness of breath. 1 Inhaler 2  . apixaban (ELIQUIS) 2.5 MG TABS tablet  Take 1 tablet (2.5 mg total) by mouth 2 (two) times daily. 60 tablet 5  . atorvastatin (LIPITOR) 20 MG tablet Take 1 tablet (20 mg total) by mouth daily. 30 tablet 11  . blood glucose meter kit and supplies KIT Dispense based on patient and insurance preference. Use up to four times daily as directed. (FOR ICD-9 250.00, 250.01). 1 each 0  . cetirizine (ZYRTEC) 10 MG tablet Take 10 mg by mouth daily.    . cyanocobalamin (,VITAMIN B-12,) 1000 MCG/ML injection Inject into the muscle.    . docusate sodium (COLACE) 100 MG capsule Take 100 mg by mouth 2 (two) times daily.    [name] FLUoxetine (PROZAC) 20 MG capsule TAKE 1 CAPSULE BY MOUTH EVERY DAY 90 capsule 1  . metFORMIN (GLUCOPHAGE-XR) 500 MG 24 hr tablet TAKE 1 TABLET BY MOUTH EVERY DAY 90 tablet 1  . NONFORMULARY OR COMPOUNDED ITEM Iron patch apply every 8 hours    . olmesartan (BENICAR) 40 MG tablet Take 40 mg by mouth daily.    . Semaglutide (RYBELSUS) 14 MG TABS Take 14 mg by mouth daily. 30 tablet 4  . Vitamin D, Ergocalciferol, (DRISDOL) 1.25 MG (50000 UNIT) CAPS capsule Take one capsule po twice weekly on Tues/Fridays 24 capsule 1   No current facility-administered medications on file prior to visit.    Allergies  Allergen Reactions  . Amoxicillin Hives    Has patient had a PCN reaction causing immediate rash, facial/tongue/throat swelling, SOB or lightheadedness with hypotension: No Has patient had a PCN reaction causing severe rash involving mucus membranes or skin necrosis: Yes Has patient had a PCN reaction that required hospitalization No Has patient had a PCN reaction occurring within the last 10 years: No If all of the above answers are "NO", then may proceed with Cephalosporin use.   . Fish Allergy Hives    Physical exam:     Today's Vitals   11/20/19 1308  BP: (!) 142/82  Pulse: 81  Weight: 287 lb (130.2 kg)  Height: 5' 4.5" (1.638 m)  Body mass index is 48.5 kg/m.      Wt Readings from Last  3 Encounters:  11/20/19 287 lb (130.2 kg)  10/09/19 288 lb 6.4 oz (130.8 kg)  09/04/19 296 lb 6.4 oz (134.4 kg)        Ht Readings from Last 3 Encounters:  11/20/19 5' 4.5" (1.638 m)  10/09/19 5' 4.5" (1.638 m)  06/25/19 5' 4.5" (1.638 m)     General:The patient is awake, alert and appears not in acute distress. The patient is well groomed. Head:Normocephalic, atraumatic. Neckis supple. Mallampati 3- very pale and crowded.   neck circumference: 15 inches.  Nasal airflow patent. Retrognathia is seen. Macroglossia, functional scalloped tongue.  Dental status: biological teeth.  Cardiovascular: Regular rate and cardiac rhythm by pulse, without distended neck veins. Respiratory: Lungs are clear to auscultation. No SOB.  Skin: With evidence of ankle edema,. Trunk: The patient's posture iserect.  Neurologic exam : The patient is awake and alert, orientedto place and time.  Memorysubjective described as intact.  Attention span &concentration ability appears normal.  Speech is fluent, without dysarthria, dysphonia or aphasia.  Mood and affectare appropriate.  Cranial nerves:no loss of smell or taste reported. Pupils are equal and briskly reactive to light. Funduscopic examdeferred. Beginning cataracts?  Extraocular movements in vertical and horizontal planes were intact and without nystagmus. No Diplopia. Visual fields by finger perimetry are intact. Hearing was intact to soft voice and finger rubbing. Facial sensation intact to fine touch. Facial motor strength is symmetric and tongue and uvula move midline.  Neck ROM : rotation, tilt and flexion extension were normal for age and shoulder shrug was symmetrical.   Motor exam:Symmetric bulk, tone and ROM.  Normal tone without cog- wheeling, symmetric grip strength .  Sensory: Fine touch, pinprick and vibration were tested  and  normal.  Proprioception tested in the upper extremities was  normal.  Coordination: Rapid alternating movements in the fingers/hands were of normal speed.  The Finger-to-nose maneuver was intact without evidence of ataxia, dysmetria or tremor.  Gait and station:Patient could rise unassisted from a seated position, walked without assistive device.  Stance is of normal width/ base and the patient turned with 3 steps.  Toe and heel walk were deferred.  Deep tendon reflexes:in the upper and lower extremities are symmetrically attenuated , trace only .  Babinski response was deferred.    After spending a total time of 35  minutes face to face and additional time for physical and neurologic examination, review of laboratory studies,  personal review of imaging studies, reports and results of other testing and review of referral information / records as far as provided in visit, I have established the following assessments:  Kristen [name] health history contains many risk factors for the presence of obstructive sleep apnea not so much of central apnea she is at this time at a body mass index is 48.5 with clothing, she had undergone a gastric sleeve surgery in December 2019, she has lost minimal 8 0 pounds.  On New Year's Day 2020 she had developed a PE which was confirmed by a VQ scan likely following the surgery which preceded the event by about 12 days.  Cardiac catheterization at the time was negative there was no pulmonary hypertension found but non-STEMI infarct was noted.  She has noticed that her snoring is not quite as loud and intrusive since she has lost weight but she still snores, she does have a very narrow upper airway with functional  macroglossia, retrognathia and a scalloped tongue.  If she would sleep supine she almost certainly would develop apnea.  She states that she sleeps 6 hours and usually wakes refreshed and restored, she is considered diabetic, she is treated for hypertension.  She is current on her eye exams.  She endorsed the  fatigue severity scale 20 out of 63 points and Epworth Sleepiness Scale at 6 out of 24 points without concern of hypersomnia, insomnia.  We are meeting today to obtain a screening test if apnea is present or not.   My Plan is to proceed with:  1) PE, NSTEMI , BMI over 45 and small airway.hot flushes, but no major sleep problems.   I like for her to undergo a PSG or HST for screening.   I would like to thank Kristen [name], [address] [place],  [place] [address] for allowing me to meet with and to take care of this pleasant patient.   Electronically signed by: Kristen Seat, MD 11/20/2019 1:17 PM  [facility] Board certified by The [employer] of Sleep Medicine and Diplomate of the [employer] of Sleep Medicine. Board certified In Neurology through the [employer], Fellow of the [employer] of Neurology. Medical Director of [facility].

## 2019-11-20 NOTE — Patient Instructions (Signed)

## 2019-11-20 NOTE — Progress Notes (Signed)
SLEEP MEDICINE CLINIC    Provider:  Larey Seat, MD  Primary Care Physician:  Glendale Chard, [address] [place] [address]     Referring Provider: Glendale Chard, [address] [place],  [place] [address]          Chief Complaint according to patient   Patient presents with:    . New Patient (Initial Visit)     rm 10. presents today to assess OSA still a concern post gastric sleeve surgery in 2019. states she snores but otherwise sleeps average of 6 hrs and feels well rested. never had a SS c/o.      HISTORY OF PRESENT ILLNESS:  Kristen [name] is a 54 y.o.  African American female patient and seen here upon  a referral for a sleep consultation on 11/20/2019 from Dr Baird Cancer.   Chief concern according to patient : [name] reports that she has struggled with obesity for much of her adult life, in 2019 she underwent a gastric sleeve surgery with Dr. Kieth Brightly, unfortunately she developed a pulmonary embolism after the surgery which was confirmed by a VQ scan.  A cardiac cath was negative also she had suffered a non-STEMI myocardial infarction.  This was diagnosed on New Year's Day 2020.  Since his sleeve surgery she has slowly but steadily lost weight and gained energy she would not consider herself excessively daytime sleepy or fatigued but she is still snoring ( husband confirmed)  and there is a concern that she may have sleep apnea burdening her pulmonary and cardiac function.  She also has not fully controlled hypertension which may be related to ongoing apnea. She sleeps an optimal time of 6 hours more or less makes her feel worse.    I have the pleasure of seeing Kristen [name] , a right -handed  African American female with a possible sleep disorder.  She has amedical history of Arthritis, Diabetes ([place]), Hypertension, Iron deficiency anemia (03/07/2018), URI (upper respiratory infection), and gastric sleeve with central  [place] surgery 01-30-28 , following this she developed a PE, and NSTEMI cardiac infarction.    The patient didn't have a preoperative sleep study .  Sleep relevant medical history: Nocturia: none, bariatric surgery, PE, thyroid nodules, non-STEMI , snoring.    Family medical /sleep history: younger sister on CPAP with OSA,    Social history:  Patient is working on her [employer]-  and lives in a household with spouse and has adult children and 2 grandsons. .  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 Pets are not  present. Tobacco use- none . ETOH use ; 2-3 drinks a month, Caffeine intake in form of Coffee( 1 cup in AM ) Soda( /) Tea ( sweet tea at dinner, 3 a week). Regular exercise in form of walking    Hobbies; no time.       Sleep habits are as follows: The patient's dinner time is between 6-7 PM. The patient goes to bed at 12 PM and has no trouble to fall asleep- she continues to sleep for 6 hours, no wake-ups .   The preferred sleep position is left side , with the support of 1 pillow.  Dreams are reportedly rare. 8 AM is the usual rise time. The patient wakes up with an alarm- hits snooze button twice for 9 minutes each. . She reports  feeling refreshed/  restored  in AM, no naps.     Review of Systems: Out of a complete 14 system review, the patient complains of only the following symptoms, and all other reviewed systems are negative.:  Only snoring, but has had HTN spikes in BP, had PE, no SOB.  Status post gastric sleeve    How likely are you to doze in the following situations: 0 = not likely, 1 = slight chance, 2 = moderate chance, 3 = high chance   Sitting and Reading? Watching Television? Sitting inactive in a public place (theater or meeting)? As a passenger in a car for an hour without a break? Lying down in the afternoon when circumstances permit? Sitting and talking to someone? Sitting quietly after lunch without  alcohol? In a car, while stopped for a few minutes in traffic?   Total = 6/ 24 points   FSS endorsed at 20/ 63 points.   Social History   Socioeconomic History  . Marital status: Married    Spouse name: Not on file  . Number of children: Not on file  . Years of education: Not on file  . Highest education level: Not on file  Occupational History  . Not on file  Tobacco Use  . Smoking status: Never Smoker  . Smokeless tobacco: Never Used  Vaping Use  . Vaping Use: Never used  Substance and Sexual Activity  . Alcohol use: Not Currently    Comment: OCCASIONAL   . Drug use: No  . Sexual activity: Yes    Birth control/protection: None  Other Topics Concern  . Not on file  Social History Narrative  . Not on file   Social Determinants of Health   Financial Resource Strain:   . Difficulty of Paying Living Expenses: Not on file  Food Insecurity:   . Worried About Charity fundraiser in the Last Year: Not on file  . Ran Out of Food in the Last Year: Not on file  Transportation Needs:   . Lack of Transportation (Medical): Not on file  . Lack of Transportation (Non-Medical): Not on file  Physical Activity:   . Days of Exercise per Week: Not on file  . Minutes of Exercise per Session: Not on file  Stress:   . Feeling of Stress : Not on file  Social Connections:   . Frequency of Communication with Friends and Family: Not on file  . Frequency of Social Gatherings with Friends and Family: Not on file  . Attends Religious Services: Not on file  . Active Member of Clubs or Organizations: Not on file  . Attends Archivist Meetings: Not on file  . Marital Status: Not on file    Family History  Problem Relation Age of Onset  . Hypertension Mother   . Diabetes Mother   . Hypertension Father   . Diabetes Father   . COPD Father   . Stroke Father   . Congestive Heart Failure Father   . Diabetes Sister   . Hypertension Sister     Past Medical History:  Diagnosis  Date  . Arthritis   . Diabetes ([place])    Type 2  . Hypertension   . Iron deficiency anemia 03/07/2018  . URI (upper respiratory infection)    took antibiotic and prednisone oct 2019 resolved  . Uses contact lenses     Past Surgical History:  Procedure Laterality Date  . BREAST BIOPSY Right 2015  . COLONOSCOPY WITH PROPOFOL N/A 03/18/2016   Procedure: COLONOSCOPY  WITH PROPOFOL;  Surgeon: Carol Ada, MD;  Location: [facility];  Service: Endoscopy;  Laterality: N/A;  . LAPAROSCOPIC GASTRIC SLEEVE RESECTION N/A 01/29/2018   Procedure: LAPAROSCOPIC GASTRIC SLEEVE RESECTION WITH UPPER ENDO AND ERAS PATHWAY;  Surgeon: Kinsinger, Arta Bruce, MD;  Location: [facility];  Service: General;  Laterality: N/A;  . RIGHT/LEFT HEART CATH AND CORONARY ANGIOGRAPHY N/A 02/15/2018   Procedure: RIGHT/LEFT HEART CATH AND CORONARY ANGIOGRAPHY;  Surgeon: Corey Skains, MD;  Location: [facility];  Service: Cardiovascular;  Laterality: N/A;  . TOTAL HIP ARTHROPLASTY Right 08/04/2017   Procedure: RIGHT TOTAL HIP ARTHROPLASTY ANTERIOR APPROACH;  Surgeon: Mcarthur Rossetti, MD;  Location: [facility];  Service: Orthopedics;  Laterality: Right;     Current Outpatient Medications on File Prior to Visit  Medication Sig Dispense Refill  . acetaminophen (TYLENOL) 500 MG tablet Take 1 tablet (500 mg total) by mouth every 6 (six) hours as needed. 30 tablet 0  . albuterol (PROVENTIL HFA;VENTOLIN HFA) 108 (90 Base) MCG/ACT inhaler Inhale 1-2 puffs into the lungs every 6 (six) hours as needed for wheezing or shortness of breath. 1 Inhaler 2  . apixaban (ELIQUIS) 2.5 MG TABS tablet Take 1 tablet (2.5 mg total) by mouth 2 (two) times daily. 60 tablet 5  . atorvastatin (LIPITOR) 20 MG tablet Take 1 tablet (20 mg total) by mouth daily. 30 tablet 11  . blood glucose meter kit and supplies KIT Dispense based on patient and insurance preference. Use up to four times daily as directed. (FOR ICD-9 250.00, 250.01). 1 each 0  .  cetirizine (ZYRTEC) 10 MG tablet Take 10 mg by mouth daily.    . cyanocobalamin (,VITAMIN B-12,) 1000 MCG/ML injection Inject into the muscle.    . docusate sodium (COLACE) 100 MG capsule Take 100 mg by mouth 2 (two) times daily.    [name] FLUoxetine (PROZAC) 20 MG capsule TAKE 1 CAPSULE BY MOUTH EVERY DAY 90 capsule 1  . metFORMIN (GLUCOPHAGE-XR) 500 MG 24 hr tablet TAKE 1 TABLET BY MOUTH EVERY DAY 90 tablet 1  . NONFORMULARY OR COMPOUNDED ITEM Iron patch apply every 8 hours    . olmesartan (BENICAR) 40 MG tablet Take 40 mg by mouth daily.    . Semaglutide (RYBELSUS) 14 MG TABS Take 14 mg by mouth daily. 30 tablet 4  . Vitamin D, Ergocalciferol, (DRISDOL) 1.25 MG (50000 UNIT) CAPS capsule Take one capsule po twice weekly on Tues/Fridays 24 capsule 1   No current facility-administered medications on file prior to visit.    Allergies  Allergen Reactions  . Amoxicillin Hives    Has patient had a PCN reaction causing immediate rash, facial/tongue/throat swelling, SOB or lightheadedness with hypotension: No Has patient had a PCN reaction causing severe rash involving mucus membranes or skin necrosis: Yes Has patient had a PCN reaction that required hospitalization No Has patient had a PCN reaction occurring within the last 10 years: No If all of the above answers are "NO", then may proceed with Cephalosporin use.   . Fish Allergy Hives    Physical exam:  Today's Vitals   11/20/19 1308  BP: (!) 142/82  Pulse: 81  Weight: 287 lb (130.2 kg)  Height: 5' 4.5" (1.638 m)   Body mass index is 48.5 kg/m.   Wt Readings from Last 3 Encounters:  11/20/19 287 lb (130.2 kg)  10/09/19 288 lb 6.4 oz (130.8 kg)  09/04/19 296 lb 6.4 oz (134.4 kg)     Ht Readings from Last  3 Encounters:  11/20/19 5' 4.5" (1.638 m)  10/09/19 5' 4.5" (1.638 m)  06/25/19 5' 4.5" (1.638 m)      General: The patient is awake, alert and appears not in acute distress. The patient is well groomed. Head: Normocephalic,  atraumatic. Neck is supple. Mallampati 3- very pale and crowded.   neck circumference: 15 inches .  Nasal airflow  patent.  Retrognathia is seen. Macroglossia, functional scalloped tongue.  Dental status: biological teeth.  Cardiovascular:  Regular rate and cardiac rhythm by pulse, without distended neck veins. Respiratory: Lungs are clear to auscultation. No SOB.  Skin:  With evidence of ankle edema,. Trunk: The patient's posture is erect.   Neurologic exam : The patient is awake and alert, oriented to place and time.   Memory subjective described as intact.  Attention span & concentration ability appears normal.  Speech is fluent,  without  dysarthria, dysphonia or aphasia.  Mood and affect are appropriate.   Cranial nerves: no loss of smell or taste reported.  Pupils are equal and briskly reactive to light. Funduscopic exam deferred. Beginning cataracts?  Extraocular movements in vertical and horizontal planes were intact and without nystagmus. No Diplopia. Visual fields by finger perimetry are intact. Hearing was intact to soft voice and finger rubbing.  Facial sensation intact to fine touch. Facial motor strength is symmetric and tongue and uvula move midline.  Neck ROM : rotation, tilt and flexion extension were normal for age and shoulder shrug was symmetrical.    Motor exam:  Symmetric bulk, tone and ROM.   Normal tone without cog- wheeling, symmetric grip strength .   Sensory:  Fine touch, pinprick and vibration were tested  and  normal.  Proprioception tested in the upper extremities was normal.   Coordination: Rapid alternating movements in the fingers/hands were of normal speed.  The Finger-to-nose maneuver was intact without evidence of ataxia, dysmetria or tremor.   Gait and station: Patient could rise unassisted from a seated position, walked without assistive device.  Stance is of normal width/ base and the patient turned with 3 steps.  Toe and heel walk were  deferred.  Deep tendon reflexes: in the upper and lower extremities are symmetrically attenuated , trace only .  Babinski response was deferred.       After spending a total time of 35  minutes face to face and additional time for physical and neurologic examination, review of laboratory studies,  personal review of imaging studies, reports and results of other testing and review of referral information / records as far as provided in visit, I have established the following assessments:  Mrs. With his health history contains many risk factors for the presence of obstructive sleep apnea not so much of central apnea she is at this time at a body mass index is 48.5 with clothing, she had undergone a gastric sleeve surgery in December 2019, she has lost minimal 8 0 pounds.  On New Year's Day 2020 she had developed a PE which was confirmed by a VQ scan likely following the surgery which preceded the event by about 12 days.  Cardiac catheterization at the time was negative there was no pulmonary hypertension found but non-STEMI infarct was noted.  She has noticed that her snoring is not quite as loud and intrusive since she has lost weight but she still snores, she does have a very narrow upper airway with functional macroglossia, retrognathia and a scalloped tongue.  If she would sleep supine she almost  certainly would develop apnea.  She states that she sleeps 6 hours and usually wakes refreshed and restored, she is considered diabetic, she is treated for hypertension.  She is current on her eye exams.  She endorsed the fatigue severity scale 20 out of 63 points and Epworth Sleepiness Scale at 6 out of 24 points without concern of hypersomnia, insomnia.  We are meeting today to obtain a screening test if apnea is present or not.    My Plan is to proceed with:  1) PE, NSTEMI , BMI over 45 and small airway.hot flushes, but no major sleep problems.   I like for her to undergo a PSG or HST for screening.    I would like to thank Glendale Chard, [address] [place],  [place] [id number] for allowing me to meet with and to take care of this pleasant patient.   Electronically signed by: Larey Seat, MD 11/20/2019 1:17 PM  [facility] Board certified by The [employer] of Sleep Medicine and Diplomate of the [employer] of Sleep Medicine. Board certified In Neurology through the [employer], Fellow of the [employer] of Neurology. Medical Director of [facility].

## 2019-11-26 ENCOUNTER — Ambulatory Visit: Payer: BC Managed Care – PPO

## 2019-11-27 ENCOUNTER — Other Ambulatory Visit: Payer: Self-pay | Admitting: Internal Medicine

## 2019-12-03 ENCOUNTER — Ambulatory Visit (INDEPENDENT_AMBULATORY_CARE_PROVIDER_SITE_OTHER): Payer: BC Managed Care – PPO | Admitting: Obstetrics and Gynecology

## 2019-12-03 ENCOUNTER — Other Ambulatory Visit: Payer: Self-pay

## 2019-12-03 ENCOUNTER — Encounter: Payer: Self-pay | Admitting: Obstetrics and Gynecology

## 2019-12-03 VITALS — BP 134/80 | Ht 65.0 in | Wt 286.0 lb

## 2019-12-03 DIAGNOSIS — N951 Menopausal and female climacteric states: Secondary | ICD-10-CM | POA: Diagnosis not present

## 2019-12-03 DIAGNOSIS — Z01419 Encounter for gynecological examination (general) (routine) without abnormal findings: Secondary | ICD-10-CM | POA: Diagnosis not present

## 2019-12-03 DIAGNOSIS — Z1231 Encounter for screening mammogram for malignant neoplasm of breast: Secondary | ICD-10-CM | POA: Diagnosis not present

## 2019-12-03 NOTE — Progress Notes (Signed)
PCP: Glendale Chard, MD   Chief Complaint  Patient presents with  . Gynecologic Exam    HPI:      Ms. Kristen [name] is a 54 y.o. No obstetric history on file. who LMP was Patient's last menstrual period was 11/15/2019 (approximate)., presents today for her annual examination.  Her menses are irregular now. Has 1 day of light spotting once or twice a month, or can skip months. No BTB, no dsymen. No AUB. Does have vasomotor sx, can't have ERT.   Sex activity: single partner, She does not have vaginal dryness.  Last Pap: 11/13/18 Results: no abnormalities/ neg HPV DNA; no hx of abn paps, no STDs.  Last mammogram: 11/20/18  Results were: normal--routine follow-up in 12 months. Has appt 11/21 at [facility]. S/p RT breast bx 2015 that was bening. There is no FH of breast cancer. There is no FH of ovarian cancer. The patient does do self-breast exams.  Colonoscopy: 2018  Repeat due after 10 years.   Tobacco use: The patient denies current or previous tobacco use. Alcohol use: social drinker  No drug use Exercise: moderately active  She does get adequate calcium and Vitamin D in her diet.  Labs with PCP. S/p bariatric surgery 2019 with subsequent non-STEMI and PE. On eliquis. Also with B12 deficiency and wears patches.   Past Medical History:  Diagnosis Date  . Arthritis   . B12 deficiency   . Diabetes ([place])    Type 2  . Hypertension   . Iron deficiency anemia 03/07/2018  . Non-STEMI (non-ST elevated myocardial infarction) ([place]) 2020  . Pulmonary embolism ([place])   . URI (upper respiratory infection)    took antibiotic and prednisone oct 2019 resolved  . Uses contact lenses     Past Surgical History:  Procedure Laterality Date  . BREAST BIOPSY Right 2015  . COLONOSCOPY WITH PROPOFOL N/A 03/18/2016   Procedure: COLONOSCOPY WITH PROPOFOL;  Surgeon: Carol Ada, MD;  [facility];  Service: Endoscopy;  Laterality: N/A;  . LAPAROSCOPIC GASTRIC SLEEVE RESECTION N/A  01/29/2018   Procedure: LAPAROSCOPIC GASTRIC SLEEVE RESECTION WITH UPPER ENDO AND ERAS PATHWAY;  Surgeon: Kinsinger, Arta Bruce, MD;  [facility];  Service: General;  Laterality: N/A;  . RIGHT/LEFT HEART CATH AND CORONARY ANGIOGRAPHY N/A 02/15/2018   Procedure: RIGHT/LEFT HEART CATH AND CORONARY ANGIOGRAPHY;  Surgeon: Corey Skains, MD;  Location: [facility];  Service: Cardiovascular;  Laterality: N/A;  . TOTAL HIP ARTHROPLASTY Right 08/04/2017   Procedure: RIGHT TOTAL HIP ARTHROPLASTY ANTERIOR APPROACH;  Surgeon: Mcarthur Rossetti, MD;  [facility];  Service: Orthopedics;  Laterality: Right;    Family History  Problem Relation Age of Onset  . Hypertension Mother   . Diabetes Mother   . Hypertension Father   . Diabetes Father   . COPD Father   . Stroke Father   . Congestive Heart Failure Father   . Diabetes Sister   . Hypertension Sister     Social History   Socioeconomic History  . Marital status: Married    Spouse name: Not on file  . Number of children: Not on file  . Years of education: Not on file  . Highest education level: Not on file  Occupational History  . Not on file  Tobacco Use  . Smoking status: Never Smoker  . Smokeless tobacco: Never Used  Vaping Use  . Vaping Use: Never used  Substance and Sexual Activity  . Alcohol use: Not  Currently    Comment: OCCASIONAL   . Drug use: No  . Sexual activity: Yes    Birth control/protection: None  Other Topics Concern  . Not on file  Social History Narrative  . Not on file   Social Determinants of Health   Financial Resource Strain:   . Difficulty of Paying Living Expenses: Not on file  Food Insecurity:   . Worried About Charity fundraiser in the Last Year: Not on file  . Ran Out of Food in the Last Year: Not on file  Transportation Needs:   . Lack of Transportation (Medical): Not on file  . Lack of Transportation (Non-Medical): Not on file  Physical Activity:   . Days of Exercise  per Week: Not on file  . Minutes of Exercise per Session: Not on file  Stress:   . Feeling of Stress : Not on file  Social Connections:   . Frequency of Communication with Friends and Family: Not on file  . Frequency of Social Gatherings with Friends and Family: Not on file  . Attends Religious Services: Not on file  . Active Member of Clubs or Organizations: Not on file  . Attends Archivist Meetings: Not on file  . Marital Status: Not on file  Intimate Partner Violence:   . Fear of Current or Ex-Partner: Not on file  . Emotionally Abused: Not on file  . Physically Abused: Not on file  . Sexually Abused: Not on file     Current Outpatient Medications:  .  acetaminophen (TYLENOL) 500 MG tablet, Take 1 tablet (500 mg total) by mouth every 6 (six) hours as needed., Disp: 30 tablet, Rfl: 0 .  albuterol (PROVENTIL HFA;VENTOLIN HFA) 108 (90 Base) MCG/ACT inhaler, Inhale 1-2 puffs into the lungs every 6 (six) hours as needed for wheezing or shortness of breath., Disp: 1 Inhaler, Rfl: 2 .  apixaban (ELIQUIS) 2.5 MG TABS tablet, Take 1 tablet (2.5 mg total) by mouth 2 (two) times daily., Disp: 60 tablet, Rfl: 5 .  atorvastatin (LIPITOR) 20 MG tablet, Take 1 tablet (20 mg total) by mouth daily., Disp: 30 tablet, Rfl: 11 .  blood glucose meter kit and supplies KIT, Dispense based on patient and insurance preference. Use up to four times daily as directed. (FOR ICD-9 250.00, 250.01)., Disp: 1 each, Rfl: 0 .  cetirizine (ZYRTEC) 10 MG tablet, Take 10 mg by mouth daily., Disp: , Rfl:  .  cyanocobalamin (,VITAMIN B-12,) 1000 MCG/ML injection, Inject into the muscle., Disp: , Rfl:  .  docusate sodium (COLACE) 100 MG capsule, Take 100 mg by mouth 2 (two) times daily., Disp: , Rfl:  .  FLUoxetine (PROZAC) 20 MG capsule, TAKE 1 CAPSULE BY MOUTH EVERY DAY, Disp: 90 capsule, Rfl: 1 .  metFORMIN (GLUCOPHAGE-XR) 500 MG 24 hr tablet, TAKE 1 TABLET BY MOUTH EVERY DAY, Disp: 90 tablet, Rfl: 1 .   NONFORMULARY OR COMPOUNDED ITEM, Iron patch apply every 8 hours, Disp: , Rfl:  .  olmesartan (BENICAR) 40 MG tablet, Take 40 mg by mouth daily., Disp: , Rfl:  .  Semaglutide (RYBELSUS) 14 MG TABS, Take 14 mg by mouth daily., Disp: 30 tablet, Rfl: 4 .  Vitamin D, Ergocalciferol, (DRISDOL) 1.25 MG (50000 UNIT) CAPS capsule, Take one capsule po twice weekly on Tues/Fridays, Disp: 24 capsule, Rfl: 1     ROS:  Review of Systems  Constitutional: Negative for fatigue, fever and unexpected weight change.  Respiratory: Negative for cough, shortness of breath  and wheezing.   Cardiovascular: Negative for chest pain, palpitations and leg swelling.  Gastrointestinal: Negative for blood in stool, constipation, diarrhea, nausea and vomiting.  Endocrine: Negative for cold intolerance, heat intolerance and polyuria.  Genitourinary: Negative for dyspareunia, dysuria, flank pain, frequency, genital sores, hematuria, menstrual problem, pelvic pain, urgency, vaginal bleeding, vaginal discharge and vaginal pain.  Musculoskeletal: Positive for arthralgias. Negative for back pain, joint swelling and myalgias.  Skin: Negative for rash.  Neurological: Negative for dizziness, syncope, light-headedness, numbness and headaches.  Hematological: Negative for adenopathy.  Psychiatric/Behavioral: Negative for agitation, confusion, sleep disturbance and suicidal ideas. The patient is not nervous/anxious.   BREAST: No symptoms    Objective: BP 134/80   Ht '5\' 5"'  (1.651 m)   Wt 286 lb (129.7 kg)   LMP 11/15/2019 (Approximate)   BMI 47.59 kg/m    Physical Exam Constitutional:      Appearance: She is well-developed.  Genitourinary:     Vulva, vagina, cervix, uterus, right adnexa and left adnexa normal.     No vulval lesion or tenderness noted.     No vaginal discharge, erythema or tenderness.     No cervical polyp.     Uterus is not enlarged or tender.     No right or left adnexal mass present.     Right  adnexa not tender.     Left adnexa not tender.  Neck:     Thyroid: No thyromegaly.  Cardiovascular:     Rate and Rhythm: Normal rate and regular rhythm.     Heart sounds: Normal heart sounds. No murmur heard.   Pulmonary:     Effort: Pulmonary effort is normal.     Breath sounds: Normal breath sounds.  Chest:     Breasts:        Right: No mass, nipple discharge, skin change or tenderness.        Left: No mass, nipple discharge, skin change or tenderness.  Abdominal:     Palpations: Abdomen is soft.     Tenderness: There is no abdominal tenderness. There is no guarding.  Musculoskeletal:        General: Normal range of motion.     Cervical back: Normal range of motion.  Neurological:     General: No focal deficit present.     Mental Status: She is alert and oriented to person, place, and time.     Cranial Nerves: No cranial nerve deficit.  Skin:    General: Skin is warm and dry.  Psychiatric:        Mood and Affect: Mood normal.        Behavior: Behavior normal.        Thought Content: Thought content normal.        Judgment: Judgment normal.  Vitals reviewed.     Assessment/Plan:  Encounter for annual routine gynecological examination  Encounter for screening mammogram for malignant neoplasm of breast--pt has mammo appt.   Perimenopause--f/u prn AUB.          GYN counsel mammography screening, menopause, adequate intake of calcium and vitamin D, diet and exercise    F/U  Return in about 1 year (around 12/02/2020).  Aurie Harroun B. Avalon Coppinger, PA-C 12/03/2019 4:33 PM

## 2019-12-03 NOTE — Patient Instructions (Signed)
I value your feedback and entrusting us with your care. If you get a [facility] patient survey, I would appreciate you taking the time to let us know about your experience today. Thank you!  As of January 24, 2019, your lab results will be released to your MyChart immediately, before I even have a chance to see them. Please give me time to review them and contact you if there are any abnormalities. Thank you for your patience.  

## 2019-12-04 ENCOUNTER — Ambulatory Visit (INDEPENDENT_AMBULATORY_CARE_PROVIDER_SITE_OTHER): Payer: BC Managed Care – PPO | Admitting: Neurology

## 2019-12-04 DIAGNOSIS — G4733 Obstructive sleep apnea (adult) (pediatric): Secondary | ICD-10-CM | POA: Diagnosis not present

## 2019-12-04 DIAGNOSIS — Z6841 Body Mass Index (BMI) 40.0 and over, adult: Secondary | ICD-10-CM

## 2019-12-04 DIAGNOSIS — R55 Syncope and collapse: Secondary | ICD-10-CM

## 2019-12-04 DIAGNOSIS — I2699 Other pulmonary embolism without acute cor pulmonale: Secondary | ICD-10-CM

## 2019-12-04 DIAGNOSIS — I238 Other current complications following acute myocardial infarction: Secondary | ICD-10-CM

## 2019-12-04 DIAGNOSIS — R0683 Snoring: Secondary | ICD-10-CM

## 2019-12-04 DIAGNOSIS — Z9884 Bariatric surgery status: Secondary | ICD-10-CM

## 2019-12-09 ENCOUNTER — Inpatient Hospital Stay: Payer: BC Managed Care – PPO | Attending: Oncology

## 2019-12-09 ENCOUNTER — Other Ambulatory Visit: Payer: Self-pay

## 2019-12-09 DIAGNOSIS — D500 Iron deficiency anemia secondary to blood loss (chronic): Secondary | ICD-10-CM

## 2019-12-09 DIAGNOSIS — D508 Other iron deficiency anemias: Secondary | ICD-10-CM | POA: Insufficient documentation

## 2019-12-09 DIAGNOSIS — Z9884 Bariatric surgery status: Secondary | ICD-10-CM | POA: Insufficient documentation

## 2019-12-09 DIAGNOSIS — E538 Deficiency of other specified B group vitamins: Secondary | ICD-10-CM | POA: Diagnosis not present

## 2019-12-09 MED ORDER — CYANOCOBALAMIN 1000 MCG/ML IJ SOLN
1000.0000 ug | Freq: Once | INTRAMUSCULAR | Status: AC
  Administered 2019-12-09: 1000 ug via INTRAMUSCULAR
  Filled 2019-12-09: qty 1

## 2019-12-14 NOTE — Procedures (Signed)
Sleep Study Report   Patient Information     First Name: Kristen D. Last Name: [name] ID: [id number]  Birth Date: 12-03-2065 Age: 54 Gender: Female  Referring Provider: Glendale Chard, MD BMI: 47.8 (W=286 lb, H=5' 5'')  Neck Circ.:  15 '' Epworth:  6/24, FSS at 20/63    Sleep Study Information    Study Date: 10/20- uploaded 12/10/19 S/H/A Version: [id number] / [id number] / 79  History:     11-20-19: Mrs. Mackel reports that she has struggled with obesity for much of her adult life, in 2019 she underwent a gastric sleeve surgery, unfortunately she developed a pulmonary embolism after surgery which was confirmed by a VQ scan.  She had suffered a non-STEMI myocardial infarction- diagnosed on New Year's Day 2020.  Since this sleeve surgery, she has slowly but steadily lost weight and gained energy. She would not consider herself excessively daytime sleepy or fatigued but she is still snoring (her husband confirmed), and there is a concern that she may have sleep apnea burdening her pulmonary and cardiac function.  She also has not fully controlled hypertension which may be related to ongoing apnea.  She sleeps an optimal time of 6 hours, and more or less sleep makes her feel worse. No need for naps.    Summary & Diagnosis:    This HST confirmed mild, but REM sleep dependent sleep apnea, and a trend to bradycardia. There was no prolonged hypoxemia noted.   Recommendations:      PAP therapy is usually chosen for REM sleep dependent apnea and will also address hypoxia, snoring and can improve sleep architecture and nocturia. I recommend a setting of auto-CPAP at 5-15 cm water, 2 cm EPR and heated humidity with a mask of choice.    Interpreting Physician: Larey Seat, MD          Sleep Summary  Oxygen Saturation Statistics   Start Study Time: End Study Time: Total Recording Time:          11:23:39 PM 7:03:41 AM   7 h, 40 min  Total Sleep Time % REM of Sleep Time:  6 h, 32 min  31.0     Mean: 93 Minimum: 83 Maximum: 97  Mean of Desaturations Nadirs (%):   89  Oxygen Desaturation. %:  4-9 10-20 >20 Total  Events Number Total   59  1 98.3 1.7  0 0.0  60 100.0  Oxygen Saturation: <90 <=88 <85 <80 <70  Duration (minutes): Sleep % 7.4 1.9 3.6 0.1 0.9 0.0 0.0 0.0 0.0 0.0     Respiratory Indices      Total Events REM NREM All Night  pRDI: pAHI 3%: ODI 4%: pAHIc 3%: % CSR: pAHI 4%:  120  109  60  0 0.0 60 39.7 36.7 25.3 0.0 8.9 7.8 2.0 0.0 18.4 16.7 9.2 0.0 9.2       Pulse Rate Statistics during Sleep (BPM)      Mean: 78 Minimum: 59 Maximum: 99    Indices are calculated using technically valid sleep time of 6 h, 31 min.                              pAHI=16.7                                       Mild  Moderate                    Severe                                                 5              15                    30   Body Position Statistics  Position Supine Prone Right Left Non-Supine  Sleep (min) 338.0 0.0 53.5 1.0 54.5  Sleep % 86.1 0.0 13.6 0.3 13.9  pRDI 17.1 N/A 27.0 N/A 26.5  pAHI 3% 15.3 N/A 25.9 N/A 25.4  ODI 4% 8.4 N/A 14.6 N/A 14.4            Left   Right  Supine    Snoring Statistics Snoring Level (dB) >40 >50 >60 >70 >80 >Threshold (45)  Sleep (min) 277.1 83.2 4.9 0.0 0.0 186.5  Sleep % 70.6 21.2 1.2 0.0 0.0 47.5    Mean: 45 dB

## 2019-12-14 NOTE — Progress Notes (Signed)
Summary & Diagnosis:   This HST confirmed mild, but REM sleep dependent sleep apnea, and  a trend to bradycardia. There was no prolonged hypoxemia noted.   Recommendations:    PAP therapy is usually chosen for REM sleep dependent apnea and  will also address hypoxia, snoring and can improve sleep  architecture and nocturia.  I recommend a setting of auto-CPAP at 5-15 cm water, 2 cm EPR and  heated humidity with a mask of choice.    Interpreting Physician: Larey Seat, MD

## 2019-12-14 NOTE — Addendum Note (Signed)
Addended by: Larey Seat on: 12/14/2019 03:22 PM   Modules accepted: Orders

## 2019-12-16 ENCOUNTER — Other Ambulatory Visit: Payer: Self-pay | Admitting: Internal Medicine

## 2019-12-17 ENCOUNTER — Telehealth: Payer: Self-pay | Admitting: Neurology

## 2019-12-17 NOTE — Telephone Encounter (Signed)
Called patient to discuss sleep study results. No answer at this time. LVM for the patient to call back.   

## 2019-12-17 NOTE — Telephone Encounter (Signed)
-----   Message from Larey Seat, MD sent at 12/14/2019  3:22 PM EDT ----- Summary & Diagnosis:   This HST confirmed mild, but REM sleep dependent sleep apnea, and  a trend to bradycardia. There was no prolonged hypoxemia noted.   Recommendations:    PAP therapy is usually chosen for REM sleep dependent apnea and  will also address hypoxia, snoring and can improve sleep  architecture and nocturia.  I recommend a setting of auto-CPAP at 5-15 cm water, 2 cm EPR and  heated humidity with a mask of choice.    Interpreting Physician: Larey Seat, MD

## 2019-12-18 NOTE — Telephone Encounter (Signed)
I called pt. I advised pt that Dr. Brett Fairy reviewed their sleep study results and found that pt has sleep apnea. Dr. Brett Fairy recommends that pt starts auto CPAP. I reviewed PAP compliance expectations with the pt. Pt is agreeable to starting a CPAP. I advised pt that an order will be sent to a DME, [employer], and [employer] will call the pt within about one week after they file with the pt's insurance. [employer] will show the pt how to use the machine, fit for masks, and troubleshoot the CPAP if needed. A follow up appt was made for insurance purposes with Dr. Brett Fairy on 03/25/20 at 3:30 pm. Pt verbalized understanding to arrive 15 minutes early and bring their CPAP. A letter with all of this information in it will be mailed to the pt as a reminder. I verified with the pt that the address we have on file is correct. Pt verbalized understanding of results. Pt had no questions at this time but was encouraged to call back if questions arise. I have sent the order to [employer] and have received confirmation that they have received the order.

## 2019-12-22 IMAGING — DX DG PORTABLE PELVIS
1 series · 1 of 1 positions shown · non-contrast
Comparison: Right hip x-rays dated July 06, 2016.

CLINICAL DATA: Right hip replacement.

EXAM:
PORTABLE PELVIS 1-2 VIEWS

[pelvis ap]
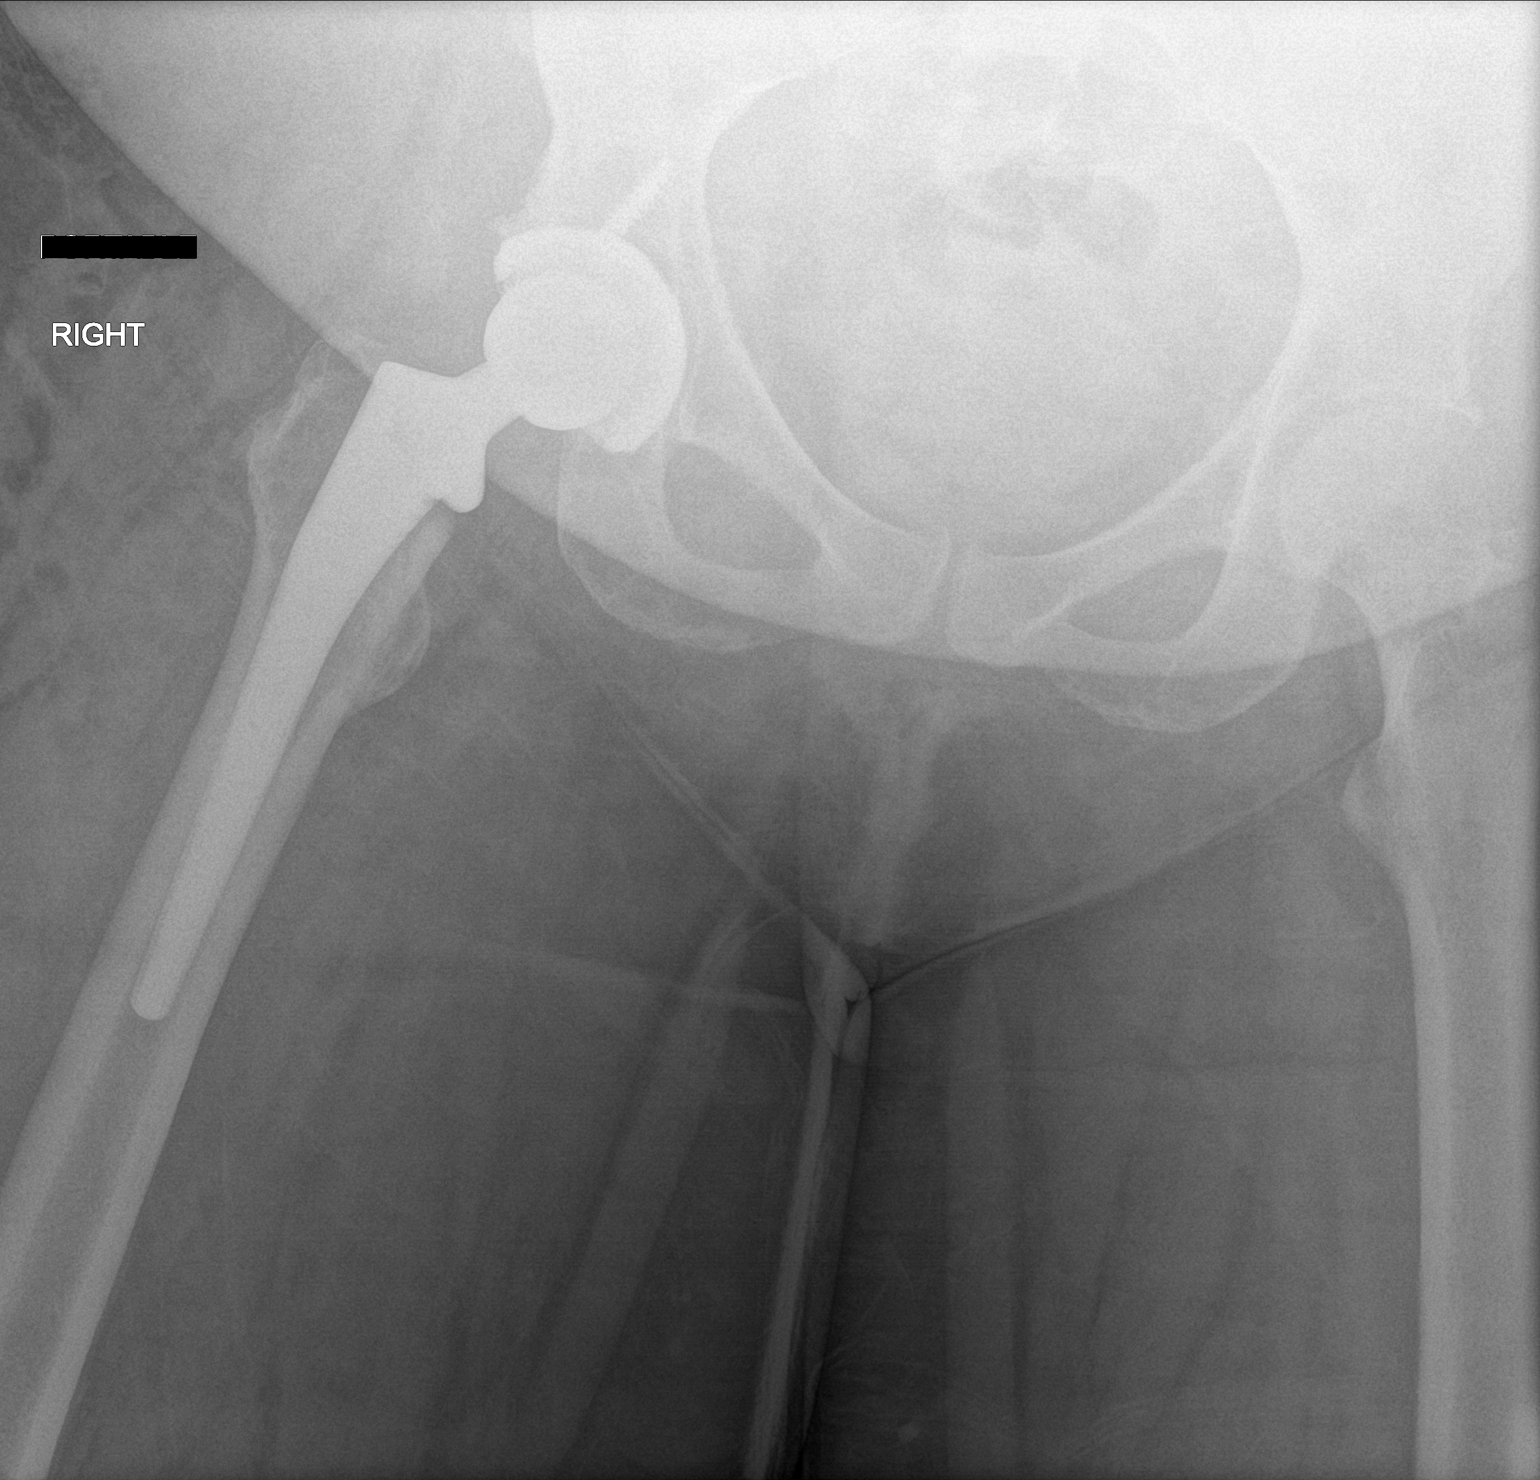

[1 of 1 positions shown; findings below may reference images not displayed]

FINDINGS: The right hip demonstrates a total arthroplasty without evidence of
hardware failure or complication. There is expected intra-articular
air. There is no fracture or dislocation. The alignment is anatomic.
Post-surgical changes noted in the surrounding soft tissues.
IMPRESSION: Interval right total hip replacement without evidence of acute
postoperative complication.

## 2019-12-24 ENCOUNTER — Other Ambulatory Visit: Payer: Self-pay

## 2019-12-24 ENCOUNTER — Ambulatory Visit
Admission: RE | Admit: 2019-12-24 | Discharge: 2019-12-24 | Disposition: A | Discharge status: Home / Self Care | Payer: BC Managed Care – PPO | Source: Ambulatory Visit | Attending: Internal Medicine | Admitting: Internal Medicine

## 2019-12-24 DIAGNOSIS — Z1231 Encounter for screening mammogram for malignant neoplasm of breast: Secondary | ICD-10-CM

## 2019-12-28 ENCOUNTER — Other Ambulatory Visit: Payer: Self-pay | Admitting: Nurse Practitioner

## 2020-01-06 ENCOUNTER — Inpatient Hospital Stay: Payer: BC Managed Care – PPO | Attending: Oncology

## 2020-01-06 ENCOUNTER — Other Ambulatory Visit: Payer: Self-pay

## 2020-01-06 DIAGNOSIS — Z9884 Bariatric surgery status: Secondary | ICD-10-CM | POA: Insufficient documentation

## 2020-01-06 DIAGNOSIS — D508 Other iron deficiency anemias: Secondary | ICD-10-CM | POA: Diagnosis present

## 2020-01-06 DIAGNOSIS — E538 Deficiency of other specified B group vitamins: Secondary | ICD-10-CM | POA: Insufficient documentation

## 2020-01-06 DIAGNOSIS — D500 Iron deficiency anemia secondary to blood loss (chronic): Secondary | ICD-10-CM

## 2020-01-06 MED ORDER — CYANOCOBALAMIN 1000 MCG/ML IJ SOLN
1000.0000 ug | Freq: Once | INTRAMUSCULAR | Status: AC
  Administered 2020-01-06: 1000 ug via INTRAMUSCULAR
  Filled 2020-01-06: qty 1

## 2020-01-07 ENCOUNTER — Ambulatory Visit (INDEPENDENT_AMBULATORY_CARE_PROVIDER_SITE_OTHER): Payer: BC Managed Care – PPO | Admitting: Internal Medicine

## 2020-01-07 ENCOUNTER — Encounter: Payer: Self-pay | Admitting: Internal Medicine

## 2020-01-07 VITALS — BP 160/100 | HR 72 | Temp 98.0°F | Ht 65.0 in | Wt 284.2 lb

## 2020-01-07 DIAGNOSIS — D473 Essential (hemorrhagic) thrombocythemia: Secondary | ICD-10-CM | POA: Insufficient documentation

## 2020-01-07 DIAGNOSIS — Z6841 Body Mass Index (BMI) 40.0 and over, adult: Secondary | ICD-10-CM

## 2020-01-07 DIAGNOSIS — I10 Essential (primary) hypertension: Secondary | ICD-10-CM

## 2020-01-07 DIAGNOSIS — Z23 Encounter for immunization: Secondary | ICD-10-CM

## 2020-01-07 DIAGNOSIS — E1159 Type 2 diabetes mellitus with other circulatory complications: Secondary | ICD-10-CM | POA: Diagnosis not present

## 2020-01-07 DIAGNOSIS — I152 Hypertension secondary to endocrine disorders: Secondary | ICD-10-CM

## 2020-01-07 MED ORDER — HYDROCHLOROTHIAZIDE 25 MG PO TABS: 25.0000 mg | ORAL_TABLET | Freq: Every day | ORAL | 0 refills | Status: DC

## 2020-01-07 MED ORDER — HYDROCHLOROTHIAZIDE 25 MG PO TABS
25.0000 mg | ORAL_TABLET | Freq: Every day | ORAL | 0 refills | Status: DC
Start: 2020-01-07 — End: 2020-02-10

## 2020-01-07 NOTE — Progress Notes (Signed)
I,Katawbba Wiggins,acting as a Education administrator for Maximino Greenland, MD.,have documented all relevant documentation on the behalf of Maximino Greenland, MD,as directed by  Maximino Greenland, MD while in the presence of Maximino Greenland, MD.  This visit occurred during the SARS-CoV-2 public health emergency.  Safety protocols were in place, including screening questions prior to the visit, additional usage of staff PPE, and extensive cleaning of exam room while observing appropriate contact time as indicated for disinfecting solutions.  Subjective:     Patient ID: Kristen [name] , female    DOB: 1965/10/01 , 54 y.o.   MRN: [id number]   Chief Complaint  Patient presents with  . Diabetes  . Hypertension    HPI  The patient is here today for a diabetes and blood pressure f/u.  She reports compliance with meds. She does not know why her pressure is up today. She admits she is stressed with work.   Diabetes She presents for her follow-up diabetic visit. She has type 2 diabetes mellitus. There are no hypoglycemic associated symptoms. Pertinent negatives for hypoglycemia include no dizziness or headaches. Pertinent negatives for diabetes include no chest pain. There are no hypoglycemic complications. Symptoms are stable. There are no diabetic complications. Risk factors for coronary artery disease include obesity and sedentary lifestyle. She is compliant with treatment all of the time.     Past Medical History:  Diagnosis Date  . Arthritis   . B12 deficiency   . Diabetes ([place])    Type 2  . Hypertension   . Iron deficiency anemia 03/07/2018  . Non-STEMI (non-ST elevated myocardial infarction) ([place]) 2020  . Pulmonary embolism ([place])   . URI (upper respiratory infection)    took antibiotic and prednisone oct 2019 resolved  . Uses contact lenses      Family History  Problem Relation Age of Onset  . Hypertension Mother   . Diabetes Mother   . Hypertension Father   . Diabetes Father   . COPD Father    . Stroke Father   . Congestive Heart Failure Father   . Diabetes Sister   . Hypertension Sister      Current Outpatient Medications:  .  acetaminophen (TYLENOL) 500 MG tablet, Take 1 tablet (500 mg total) by mouth every 6 (six) hours as needed., Disp: 30 tablet, Rfl: 0 .  albuterol (PROVENTIL HFA;VENTOLIN HFA) 108 (90 Base) MCG/ACT inhaler, Inhale 1-2 puffs into the lungs every 6 (six) hours as needed for wheezing or shortness of breath., Disp: 1 Inhaler, Rfl: 2 .  apixaban (ELIQUIS) 2.5 MG TABS tablet, Take 1 tablet (2.5 mg total) by mouth 2 (two) times daily., Disp: 60 tablet, Rfl: 5 .  atorvastatin (LIPITOR) 20 MG tablet, Take 1 tablet (20 mg total) by mouth daily., Disp: 30 tablet, Rfl: 11 .  blood glucose meter kit and supplies KIT, Dispense based on patient and insurance preference. Use up to four times daily as directed. (FOR ICD-9 250.00, 250.01)., Disp: 1 each, Rfl: 0 .  cetirizine (ZYRTEC) 10 MG tablet, Take 10 mg by mouth daily., Disp: , Rfl:  .  cyanocobalamin (,VITAMIN B-12,) 1000 MCG/ML injection, Inject into the muscle., Disp: , Rfl:  .  docusate sodium (COLACE) 100 MG capsule, Take 100 mg by mouth 2 (two) times daily., Disp: , Rfl:  .  FLUoxetine (PROZAC) 20 MG capsule, TAKE 1 CAPSULE BY MOUTH EVERY DAY, Disp: 90 capsule, Rfl: 1 .  metFORMIN (GLUCOPHAGE-XR) 500 MG 24 hr tablet, TAKE 1 TABLET  BY MOUTH EVERY DAY, Disp: 90 tablet, Rfl: 1 .  NONFORMULARY OR COMPOUNDED ITEM, Iron patch apply every 8 hours, Disp: , Rfl:  .  olmesartan (BENICAR) 40 MG tablet, Take 40 mg by mouth daily., Disp: , Rfl:  .  Semaglutide (RYBELSUS) 14 MG TABS, Take 14 mg by mouth daily., Disp: 30 tablet, Rfl: 4 .  Vitamin D, Ergocalciferol, (DRISDOL) 1.25 MG (50000 UNIT) CAPS capsule, Take one capsule po twice weekly on Tues/Fridays, Disp: 24 capsule, Rfl: 1 .  hydrochlorothiazide (HYDRODIURIL) 25 MG tablet, Take 1 tablet (25 mg total) by mouth daily., Disp: 30 tablet, Rfl: 0   Allergies  Allergen  Reactions  . Amoxicillin Hives    Has patient had a PCN reaction causing immediate rash, facial/tongue/throat swelling, SOB or lightheadedness with hypotension: No Has patient had a PCN reaction causing severe rash involving mucus membranes or skin necrosis: Yes Has patient had a PCN reaction that required hospitalization No Has patient had a PCN reaction occurring within the last 10 years: No If all of the above answers are "NO", then may proceed with Cephalosporin use.   . Fish Allergy Hives     Review of Systems  Constitutional: Negative.   HENT: Negative.   Eyes: Negative.   Respiratory: Negative.   Cardiovascular: Negative.  Negative for chest pain.  Gastrointestinal: Negative.   Endocrine: Negative.   Genitourinary: Negative.   Musculoskeletal: Negative.   Skin: Negative.   Allergic/Immunologic: Negative.   Neurological: Negative.  Negative for dizziness and headaches.  Hematological: Negative.   Psychiatric/Behavioral: Negative.      Today's Vitals   01/07/20 1500 01/07/20 1551  BP: (!) 162/110 (!) 160/100  Pulse: 72   Temp: 98 F (36.7 C)   TempSrc: Oral   Weight: 284 lb 3.2 oz (128.9 kg)   Height: _0  (1.651 m)    Body mass index is 47.29 kg/m.  Wt Readings from Last 3 Encounters:  01/07/20 284 lb 3.2 oz (128.9 kg)  12/03/19 286 lb (129.7 kg)  11/20/19 287 lb (130.2 kg)   Objective:  Physical Exam Vitals and nursing note reviewed.  Constitutional:      Appearance: Normal appearance. She is obese.  HENT:     Head: Normocephalic and atraumatic.  Cardiovascular:     Rate and Rhythm: Normal rate and regular rhythm.     Heart sounds: Normal heart sounds.  Pulmonary:     Breath sounds: Normal breath sounds.  Musculoskeletal:     Cervical back: Normal range of motion.  Skin:    General: Skin is warm.  Neurological:     General: No focal deficit present.     Mental Status: She is alert and oriented to person, place, and time.         Assessment  And Plan:     1. High blood pressure associated with diabetes (HCC)  Chronic, terribly uncontrolled. Repeat BP improved, not at goal. Importance of dietary/medication compliance was discussed with the patient.  - BMP8+EGFR - Hemoglobin A1c  2. Essential hypertension Comments: Chronic, uncontrolled. Importance of medication/dietary compliance was discussed with the patient.   3. Essential (hemorrhagic) thrombocythemia ([place]) Comments: Most recent CBC with normal platelets. Still under the care of Hematology.   4. Class 3 severe obesity due to excess calories without serious comorbidity with body mass index (BMI) of 45.0 to 49.9 in adult [facility]) Comments: BMI 47.  She is encouraged to strive for BMI less than 40 to decrease cardiac risk. Advised to incorporate  more exercise into her daily routine, aiming for 150 minutes per week.   5. Need for vaccination Comments: She was given flu vaccine to update her immunization history.  - Flu Vaccine QUAD 6+ mos PF IM (Fluarix Quad PF)   Patient was given opportunity to ask questions. Patient verbalized understanding of the plan and was able to repeat key elements of the plan. All questions were answered to their satisfaction.  Maximino Greenland, MD   I, Maximino Greenland, MD, have reviewed all documentation for this visit. The documentation on 01/12/20 for the exam, diagnosis, procedures, and orders are all accurate and complete.  THE PATIENT IS ENCOURAGED TO PRACTICE SOCIAL DISTANCING DUE TO THE COVID-19 PANDEMIC.

## 2020-01-07 NOTE — Patient Instructions (Signed)
Diabetes Mellitus and Foot Care Foot care is an important part of your health, especially when you have diabetes. Diabetes may cause you to have problems because of poor blood flow (circulation) to your feet and legs, which can cause your skin to:  Become thinner and drier.  Break more easily.  Heal more slowly.  Peel and crack. You may also have nerve damage (neuropathy) in your legs and feet, causing decreased feeling in them. This means that you may not notice minor injuries to your feet that could lead to more serious problems. Noticing and addressing any potential problems early is the best way to prevent future foot problems. How to care for your feet Foot hygiene  Wash your feet daily with warm water and mild soap. Do not use hot water. Then, pat your feet and the areas between your toes until they are completely dry. Do not soak your feet as this can dry your skin.  Trim your toenails straight across. Do not dig under them or around the cuticle. File the edges of your nails with an emery board or nail file.  Apply a moisturizing lotion or petroleum jelly to the skin on your feet and to dry, brittle toenails. Use lotion that does not contain alcohol and is unscented. Do not apply lotion between your toes. Shoes and socks  Wear clean socks or stockings every day. Make sure they are not too tight. Do not wear knee-high stockings since they may decrease blood flow to your legs.  Wear shoes that fit properly and have enough cushioning. Always look in your shoes before you put them on to be sure there are no objects inside.  To break in new shoes, wear them for just a few hours a day. This prevents injuries on your feet. Wounds, scrapes, corns, and calluses  Check your feet daily for blisters, cuts, bruises, sores, and redness. If you cannot see the bottom of your feet, use a mirror or ask someone for help.  Do not cut corns or calluses or try to remove them with medicine.  If you  find a minor scrape, cut, or break in the skin on your feet, keep it and the skin around it clean and dry. You may clean these areas with mild soap and water. Do not clean the area with peroxide, alcohol, or iodine.  If you have a wound, scrape, corn, or callus on your foot, look at it several times a day to make sure it is healing and not infected. Check for: ? Redness, swelling, or pain. ? Fluid or blood. ? Warmth. ? Pus or a bad smell. General instructions  Do not cross your legs. This may decrease blood flow to your feet.  Do not use heating pads or hot water bottles on your feet. They may burn your skin. If you have lost feeling in your feet or legs, you may not know this is happening until it is too late.  Protect your feet from hot and cold by wearing shoes, such as at the beach or on hot pavement.  Schedule a complete foot exam at least once a year (annually) or more often if you have foot problems. If you have foot problems, report any cuts, sores, or bruises to your health care provider immediately. Contact a health care provider if:  You have a medical condition that increases your risk of infection and you have any cuts, sores, or bruises on your feet.  You have an injury that is not   healing.  You have redness on your legs or feet.  You feel burning or tingling in your legs or feet.  You have pain or cramps in your legs and feet.  Your legs or feet are numb.  Your feet always feel cold.  You have pain around a toenail. Get help right away if:  You have a wound, scrape, corn, or callus on your foot and: ? You have pain, swelling, or redness that gets worse. ? You have fluid or blood coming from the wound, scrape, corn, or callus. ? Your wound, scrape, corn, or callus feels warm to the touch. ? You have pus or a bad smell coming from the wound, scrape, corn, or callus. ? You have a fever. ? You have a red line going up your leg. Summary  Check your feet every day  for cuts, sores, red spots, swelling, and blisters.  Moisturize feet and legs daily.  Wear shoes that fit properly and have enough cushioning.  If you have foot problems, report any cuts, sores, or bruises to your health care provider immediately.  Schedule a complete foot exam at least once a year (annually) or more often if you have foot problems. This information is not intended to replace advice given to you by your health care provider. Make sure you discuss any questions you have with your health care provider. Document Revised: 10/24/2018 Document Reviewed: 03/04/2016 Elsevier Patient Education  2020 [employer]  

## 2020-01-08 LAB — BMP8+EGFR
BUN/Creatinine Ratio: 14 (ref 9–23)
BUN: 13 mg/dL (ref 6–24)
CO2: 26 mmol/L (ref 20–29)
Calcium: 9.4 mg/dL (ref 8.7–10.2)
Chloride: 107 mmol/L — ABNORMAL HIGH (ref 96–106)
Creatinine, Ser: 0.96 mg/dL (ref 0.57–1.00)
GFR calc Af Amer: 78 mL/min/{1.73_m2} (ref >59)
GFR calc non Af Amer: 67 mL/min/{1.73_m2} (ref >59)
Glucose: 98 mg/dL (ref 65–99)
Potassium: 4.6 mmol/L (ref 3.5–5.2)
Sodium: 144 mmol/L (ref 134–144)

## 2020-01-08 LAB — HEMOGLOBIN A1C
Est. average glucose Bld gHb Est-mCnc: 131 mg/dL
Hgb A1c MFr Bld: 6.2 % — ABNORMAL HIGH (ref 4.8–5.6)

## 2020-01-15 ENCOUNTER — Encounter: Payer: Self-pay | Admitting: Internal Medicine

## 2020-01-27 ENCOUNTER — Encounter: Payer: Self-pay | Admitting: Internal Medicine

## 2020-02-07 ENCOUNTER — Other Ambulatory Visit: Payer: Self-pay | Admitting: Internal Medicine

## 2020-02-10 ENCOUNTER — Inpatient Hospital Stay: Payer: BC Managed Care – PPO | Attending: Oncology

## 2020-02-10 DIAGNOSIS — D508 Other iron deficiency anemias: Secondary | ICD-10-CM | POA: Insufficient documentation

## 2020-02-10 DIAGNOSIS — E538 Deficiency of other specified B group vitamins: Secondary | ICD-10-CM | POA: Diagnosis present

## 2020-02-10 DIAGNOSIS — D500 Iron deficiency anemia secondary to blood loss (chronic): Secondary | ICD-10-CM

## 2020-02-10 MED ORDER — CYANOCOBALAMIN 1000 MCG/ML IJ SOLN
1000.0000 ug | Freq: Once | INTRAMUSCULAR | Status: AC
  Administered 2020-02-10: 1000 ug via INTRAMUSCULAR
  Filled 2020-02-10: qty 1

## 2020-02-12 ENCOUNTER — Other Ambulatory Visit: Payer: Self-pay | Admitting: Otolaryngology

## 2020-02-12 DIAGNOSIS — E041 Nontoxic single thyroid nodule: Secondary | ICD-10-CM

## 2020-02-23 ENCOUNTER — Other Ambulatory Visit: Payer: Self-pay | Admitting: Internal Medicine

## 2020-03-06 ENCOUNTER — Other Ambulatory Visit: Payer: Self-pay

## 2020-03-06 ENCOUNTER — Other Ambulatory Visit: Payer: Self-pay | Admitting: Internal Medicine

## 2020-03-06 ENCOUNTER — Inpatient Hospital Stay: Payer: BC Managed Care – PPO | Attending: Oncology

## 2020-03-06 DIAGNOSIS — R55 Syncope and collapse: Secondary | ICD-10-CM | POA: Insufficient documentation

## 2020-03-06 DIAGNOSIS — Z880 Allergy status to penicillin: Secondary | ICD-10-CM | POA: Insufficient documentation

## 2020-03-06 DIAGNOSIS — I252 Old myocardial infarction: Secondary | ICD-10-CM | POA: Insufficient documentation

## 2020-03-06 DIAGNOSIS — R778 Other specified abnormalities of plasma proteins: Secondary | ICD-10-CM | POA: Insufficient documentation

## 2020-03-06 DIAGNOSIS — R42 Dizziness and giddiness: Secondary | ICD-10-CM | POA: Insufficient documentation

## 2020-03-06 DIAGNOSIS — I2699 Other pulmonary embolism without acute cor pulmonale: Secondary | ICD-10-CM | POA: Insufficient documentation

## 2020-03-06 DIAGNOSIS — I2510 Atherosclerotic heart disease of native coronary artery without angina pectoris: Secondary | ICD-10-CM | POA: Insufficient documentation

## 2020-03-06 DIAGNOSIS — Z833 Family history of diabetes mellitus: Secondary | ICD-10-CM | POA: Insufficient documentation

## 2020-03-06 DIAGNOSIS — E119 Type 2 diabetes mellitus without complications: Secondary | ICD-10-CM | POA: Diagnosis not present

## 2020-03-06 DIAGNOSIS — D500 Iron deficiency anemia secondary to blood loss (chronic): Secondary | ICD-10-CM | POA: Insufficient documentation

## 2020-03-06 DIAGNOSIS — Z823 Family history of stroke: Secondary | ICD-10-CM | POA: Insufficient documentation

## 2020-03-06 DIAGNOSIS — I272 Pulmonary hypertension, unspecified: Secondary | ICD-10-CM | POA: Insufficient documentation

## 2020-03-06 DIAGNOSIS — Z9884 Bariatric surgery status: Secondary | ICD-10-CM | POA: Diagnosis not present

## 2020-03-06 DIAGNOSIS — Z8249 Family history of ischemic heart disease and other diseases of the circulatory system: Secondary | ICD-10-CM | POA: Diagnosis not present

## 2020-03-06 DIAGNOSIS — Z836 Family history of other diseases of the respiratory system: Secondary | ICD-10-CM | POA: Diagnosis not present

## 2020-03-06 DIAGNOSIS — E538 Deficiency of other specified B group vitamins: Secondary | ICD-10-CM | POA: Diagnosis present

## 2020-03-06 DIAGNOSIS — Z79899 Other long term (current) drug therapy: Secondary | ICD-10-CM | POA: Diagnosis not present

## 2020-03-06 DIAGNOSIS — Z7901 Long term (current) use of anticoagulants: Secondary | ICD-10-CM | POA: Insufficient documentation

## 2020-03-06 LAB — CBC WITH DIFFERENTIAL/PLATELET
Abs Immature Granulocytes: 0.02 10*3/uL (ref 0.00–0.07)
Basophils Absolute: 0 10*3/uL (ref 0.0–0.1)
Basophils Relative: 0 %
Eosinophils Absolute: 0.1 10*3/uL (ref 0.0–0.5)
Eosinophils Relative: 2 %
HCT: 39.6 % (ref 36.0–46.0)
Hemoglobin: 13.1 g/dL (ref 12.0–15.0)
Immature Granulocytes: 0 %
Lymphocytes Relative: 46 %
Lymphs Abs: 2.6 10*3/uL (ref 0.7–4.0)
MCH: 29.3 pg (ref 26.0–34.0)
MCHC: 33.1 g/dL (ref 30.0–36.0)
MCV: 88.6 fL (ref 80.0–100.0)
Monocytes Absolute: 0.4 10*3/uL (ref 0.1–1.0)
Monocytes Relative: 7 %
Neutro Abs: 2.5 10*3/uL (ref 1.7–7.7)
Neutrophils Relative %: 45 %
Platelets: 306 10*3/uL (ref 150–400)
RBC: 4.47 MIL/uL (ref 3.87–5.11)
RDW: 13.9 % (ref 11.5–15.5)
WBC: 5.7 10*3/uL (ref 4.0–10.5)
nRBC: 0 % (ref 0.0–0.2)

## 2020-03-06 LAB — COMPREHENSIVE METABOLIC PANEL
ALT: 19 U/L (ref 0–44)
AST: 27 U/L (ref 15–41)
Albumin: 4 g/dL (ref 3.5–5.0)
Alkaline Phosphatase: 76 U/L (ref 38–126)
Anion gap: 3 — ABNORMAL LOW (ref 5–15)
BUN: 22 mg/dL — ABNORMAL HIGH (ref 6–20)
CO2: 33 mmol/L — ABNORMAL HIGH (ref 22–32)
Calcium: 9.1 mg/dL (ref 8.9–10.3)
Chloride: 101 mmol/L (ref 98–111)
Creatinine, Ser: 1.08 mg/dL — ABNORMAL HIGH (ref 0.44–1.00)
GFR, Estimated: >60 mL/min (ref >60)
Glucose, Bld: 140 mg/dL — ABNORMAL HIGH (ref 70–99)
Potassium: 4.1 mmol/L (ref 3.5–5.1)
Sodium: 137 mmol/L (ref 135–145)
Total Bilirubin: 0.4 mg/dL (ref 0.3–1.2)
Total Protein: 8 g/dL (ref 6.5–8.1)

## 2020-03-06 LAB — IRON AND TIBC
Iron: 86 ug/dL (ref 28–170)
Saturation Ratios: 28 % (ref 10.4–31.8)
TIBC: 305 ug/dL (ref 250–450)
UIBC: 219 ug/dL

## 2020-03-06 LAB — FOLATE: Folate: 6.7 ng/mL (ref >5.9)

## 2020-03-06 LAB — VITAMIN B12: Vitamin B-12: 498 pg/mL (ref 180–914)

## 2020-03-06 LAB — FERRITIN: Ferritin: 137 ng/mL (ref 11–307)

## 2020-03-08 ENCOUNTER — Encounter: Payer: Self-pay | Admitting: Internal Medicine

## 2020-03-09 ENCOUNTER — Inpatient Hospital Stay (HOSPITAL_BASED_OUTPATIENT_CLINIC_OR_DEPARTMENT_OTHER): Payer: BC Managed Care – PPO | Admitting: Oncology

## 2020-03-09 ENCOUNTER — Inpatient Hospital Stay: Payer: BC Managed Care – PPO

## 2020-03-09 ENCOUNTER — Encounter: Payer: Self-pay | Admitting: Oncology

## 2020-03-09 ENCOUNTER — Other Ambulatory Visit: Payer: Self-pay

## 2020-03-09 VITALS — BP 115/72 | HR 94 | Temp 98.0°F | Resp 18 | Wt 279.6 lb

## 2020-03-09 DIAGNOSIS — D500 Iron deficiency anemia secondary to blood loss (chronic): Secondary | ICD-10-CM | POA: Diagnosis not present

## 2020-03-09 DIAGNOSIS — Z9884 Bariatric surgery status: Secondary | ICD-10-CM | POA: Diagnosis not present

## 2020-03-09 DIAGNOSIS — E538 Deficiency of other specified B group vitamins: Secondary | ICD-10-CM

## 2020-03-09 DIAGNOSIS — Z86711 Personal history of pulmonary embolism: Secondary | ICD-10-CM

## 2020-03-09 MED ORDER — RYBELSUS 14 MG PO TABS: 14.0000 mg | ORAL_TABLET | Freq: Every day | ORAL | 1 refills | Status: DC

## 2020-03-09 MED ORDER — VITAMIN D (ERGOCALCIFEROL) 1.25 MG (50000 UNIT) PO CAPS: ORAL_CAPSULE | ORAL | 1 refills | Status: DC

## 2020-03-09 MED ORDER — APIXABAN 2.5 MG PO TABS: 2.5000 mg | ORAL_TABLET | Freq: Two times a day (BID) | ORAL | 5 refills | Status: DC

## 2020-03-09 MED ORDER — CYANOCOBALAMIN 1000 MCG/ML IJ SOLN
1000.0000 ug | Freq: Once | INTRAMUSCULAR | Status: AC
  Administered 2020-03-09: 1000 ug via INTRAMUSCULAR
  Filled 2020-03-09: qty 1

## 2020-03-09 NOTE — Progress Notes (Signed)
Patient denies new problems/concerns today.   °

## 2020-03-09 NOTE — Progress Notes (Signed)
Hematology/Oncology Follow Up Note [facility]  Telephone:([phone] [phone]  Patient Care Team: Glendale Chard, MD as PCP - General (Internal Medicine) Earlie Server, MD as Consulting Physician (Oncology)   Name of the patient: Kristen [name]  [id number]  [birth year]   REASON FOR VISIT  follow-up for management of pulmonary embolism  PERTINENT HEMATOLOGY HISTORY Dorna D Mclees is a 55 y.o.afemale who has above oncology history reviewed by me today presented for follow up visit for management of pulmonary embolism.  Patient was seen by me during hospitalization.. Patient presents to ER for evaluation of syncope episode. Patient fainted but did not fall or hurt herself as family member caught patient.  EKG in ED showed ST changed. Troponin elevated and started on heparin drip and admitted.  On day 1 of admission, CODE BLUE was called and patient was unresponsive and received brief chest compression. Patient woke up and talked.  No cardiac arrest.  She reports feeling dizzy and lightheaded and passed out.  Similar to the syncope episode prior to admission. Cardiac catheterization showed normal LV systolic function and hyperdynamic in nature with ejection fraction of 70% consistent with hyper trophic cardiomyopathy.  Significantly elevated pulmonary pressure possibly consistent with pulmonary embolism.  No cardiac intervention due to normal coronary arteries. Patient had VQ scan on 02/15/2018 which showed hypermobility for pulmonary embolism Patient was on heparin drip and was transitioned to Eliquis 10 mg twice daily started on 02/16/2018.  # PE finished 6 months of Eliquis 92m BID.  # History of gastric sleeve  INTERVAL HISTORY Evone D WBonetis a 55y.o.female who has above oncology history reviewed by me today presented for follow up visit for management of pulmonary embolism, iron deficiency, vitamin B12 deficiency.  Patient reports no new  complaints. SHe has been on Eliquis 2.5 mg twice daily.  Patient tolerates well.  No bleeding events. Patient has been on vitamin B12 injections monthly .  She has no new complaints Review of Systems  Constitutional: Negative for appetite change, chills, fatigue and fever.  HENT:   Negative for hearing loss and voice change.   Eyes: Negative for eye problems.  Respiratory: Negative for chest tightness, cough and shortness of breath.   Cardiovascular: Negative for chest pain.  Gastrointestinal: Negative for abdominal distention, abdominal pain and blood in stool.  Endocrine: Negative for hot flashes.  Genitourinary: Negative for difficulty urinating and frequency.   Musculoskeletal: Negative for arthralgias.  Skin: Negative for itching and rash.  Neurological: Negative for extremity weakness.  Hematological: Negative for adenopathy.  Psychiatric/Behavioral: Negative for confusion.      Allergies  Allergen Reactions  . Amoxicillin Hives    Has patient had a PCN reaction causing immediate rash, facial/tongue/throat swelling, SOB or lightheadedness with hypotension: No Has patient had a PCN reaction causing severe rash involving mucus membranes or skin necrosis: Yes Has patient had a PCN reaction that required hospitalization No Has patient had a PCN reaction occurring within the last 10 years: No If all of the above answers are "NO", then may proceed with Cephalosporin use.   . Fish Allergy Hives     Past Medical History:  Diagnosis Date  . Arthritis   . B12 deficiency   . Diabetes ([facility]    Type 2  . Hypertension   . Iron deficiency anemia 03/07/2018  . Non-STEMI (non-ST elevated myocardial infarction) [place]  . Pulmonary embolism ([facility]   . URI (upper respiratory infection)    took  antibiotic and prednisone oct 2019 resolved  . Uses contact lenses      Past Surgical History:  Procedure Laterality Date  . BREAST BIOPSY Right 2015  . COLONOSCOPY WITH PROPOFOL N/A  03/18/2016   Procedure: COLONOSCOPY WITH PROPOFOL;  Surgeon: Carol Ada, MD;  Location: [facility];  Service: Endoscopy;  Laterality: N/A;  . LAPAROSCOPIC GASTRIC SLEEVE RESECTION N/A 01/29/2018   Procedure: LAPAROSCOPIC GASTRIC SLEEVE RESECTION WITH UPPER ENDO AND ERAS PATHWAY;  Surgeon: Kinsinger, Arta Bruce, MD;  Location: [facility];  Service: General;  Laterality: N/A;  . RIGHT/LEFT HEART CATH AND CORONARY ANGIOGRAPHY N/A 02/15/2018   Procedure: RIGHT/LEFT HEART CATH AND CORONARY ANGIOGRAPHY;  Surgeon: Corey Skains, MD;  Location: [place] [facility];  Service: Cardiovascular;  Laterality: N/A;  . TOTAL HIP ARTHROPLASTY Right 08/04/2017   Procedure: RIGHT TOTAL HIP ARTHROPLASTY ANTERIOR APPROACH;  Surgeon: Mcarthur Rossetti, MD;  Location: [facility];  Service: Orthopedics;  Laterality: Right;    Social History   Socioeconomic History  . Marital status: Married    Spouse name: Not on file  . Number of children: Not on file  . Years of education: Not on file  . Highest education level: Not on file  Occupational History  . Not on file  Tobacco Use  . Smoking status: Never Smoker  . Smokeless tobacco: Never Used  Vaping Use  . Vaping Use: Never used  Substance and Sexual Activity  . Alcohol use: Not Currently    Comment: OCCASIONAL   . Drug use: No  . Sexual activity: Yes    Birth control/protection: None  Other Topics Concern  . Not on file  Social History Narrative  . Not on file   Social Determinants of Health   Financial Resource Strain: Not on file  Food Insecurity: Not on file  Transportation Needs: Not on file  Physical Activity: Not on file  Stress: Not on file  Social Connections: Not on file  Intimate Partner Violence: Not on file    Family History  Problem Relation Age of Onset  . Hypertension Mother   . Diabetes Mother   . Hypertension Father   . Diabetes Father   . COPD Father   . Stroke Father   . Congestive Heart Failure Father   . Diabetes  Sister   . Hypertension Sister      Current Outpatient Medications:  .  acetaminophen (TYLENOL) 500 MG tablet, Take 1 tablet (500 mg total) by mouth every 6 (six) hours as needed., Disp: 30 tablet, Rfl: 0 .  albuterol (PROVENTIL HFA;VENTOLIN HFA) 108 (90 Base) MCG/ACT inhaler, Inhale 1-2 puffs into the lungs every 6 (six) hours as needed for wheezing or shortness of breath., Disp: 1 Inhaler, Rfl: 2 .  apixaban (ELIQUIS) 2.5 MG TABS tablet, Take 1 tablet (2.5 mg total) by mouth 2 (two) times daily., Disp: 60 tablet, Rfl: 5 .  atorvastatin (LIPITOR) 20 MG tablet, Take 1 tablet (20 mg total) by mouth daily., Disp: 30 tablet, Rfl: 11 .  blood glucose meter kit and supplies KIT, Dispense based on patient and insurance preference. Use up to four times daily as directed. (FOR ICD-9 250.00, 250.01)., Disp: 1 each, Rfl: 0 .  cetirizine (ZYRTEC) 10 MG tablet, Take 10 mg by mouth daily., Disp: , Rfl:  .  cyanocobalamin (,VITAMIN B-12,) 1000 MCG/ML injection, Inject into the muscle., Disp: , Rfl:  .  docusate sodium (COLACE) 100 MG capsule, Take 100 mg by mouth 2 (two) times daily., Disp: ,  Rfl:  .  FLUoxetine (PROZAC) 20 MG capsule, TAKE 1 CAPSULE BY MOUTH EVERY DAY, Disp: 90 capsule, Rfl: 1 .  hydrochlorothiazide (HYDRODIURIL) 25 MG tablet, TAKE 1 TABLET BY MOUTH EVERY DAY, Disp: 90 tablet, Rfl: 1 .  metFORMIN (GLUCOPHAGE-XR) 500 MG 24 hr tablet, TAKE 1 TABLET BY MOUTH EVERY DAY, Disp: 90 tablet, Rfl: 1 .  NONFORMULARY OR COMPOUNDED ITEM, Iron patch apply every 8 hours, Disp: , Rfl:  .  olmesartan (BENICAR) 40 MG tablet, Take 40 mg by mouth daily., Disp: , Rfl:  .  Semaglutide (RYBELSUS) 14 MG TABS, Take 14 mg by mouth daily., Disp: 90 tablet, Rfl: 1 .  Vitamin D, Ergocalciferol, (DRISDOL) 1.25 MG (50000 UNIT) CAPS capsule, TAKE ONE CAPSULE BY MOUTH TWICE WEEKLY ON TUES/FRIDAYS, Disp: 24 capsule, Rfl: 1  Physical exam:  Vitals:   03/09/20 1307  BP: 115/72  Pulse: 94  Resp: 18  Temp: 98 F (36.7 C)   Weight: 279 lb 9.6 oz (126.8 kg)   Physical Exam Constitutional:      General: She is not in acute distress.    Comments: Morbidly obese.   HENT:     Head: Normocephalic and atraumatic.     Comments: Chronic alopecia Eyes:     General: No scleral icterus.    Pupils: Pupils are equal, round, and reactive to light.  Cardiovascular:     Rate and Rhythm: Normal rate and regular rhythm.     Heart sounds: Normal heart sounds.  Pulmonary:     Effort: Pulmonary effort is normal. No respiratory distress.     Breath sounds: No wheezing.  Abdominal:     General: Bowel sounds are normal. There is no distension.     Palpations: Abdomen is soft. There is no mass.     Tenderness: There is no abdominal tenderness.  Musculoskeletal:        General: No deformity. Normal range of motion.     Cervical back: Normal range of motion and neck supple.  Skin:    General: Skin is warm and dry.     Findings: No erythema or rash.  Neurological:     Mental Status: She is alert and oriented to person, place, and time.     Cranial Nerves: No cranial nerve deficit.     Coordination: Coordination normal.  Psychiatric:        Behavior: Behavior normal.        Thought Content: Thought content normal.     CMP Latest Ref Rng & Units 03/06/2020  Glucose 70 - 99 mg/dL 140(H)  BUN 6 - 20 mg/dL 22(H)  Creatinine 0.44 - 1.00 mg/dL 1.08(H)  Sodium 135 - 145 mmol/L 137  Potassium 3.5 - 5.1 mmol/L 4.1  Chloride 98 - 111 mmol/L 101  CO2 22 - 32 mmol/L 33(H)  Calcium 8.9 - 10.3 mg/dL 9.1  Total Protein 6.5 - 8.1 g/dL 8.0  Total Bilirubin 0.3 - 1.2 mg/dL 0.4  Alkaline Phos 38 - 126 U/L 76  AST 15 - 41 U/L 27  ALT 0 - 44 U/L 19   CBC Latest Ref Rng & Units 03/06/2020  WBC 4.0 - 10.5 K/uL 5.7  Hemoglobin 12.0 - 15.0 g/dL 13.1  Hematocrit 36.0 - 46.0 % 39.6  Platelets 150 - 400 K/uL 306   RADIOGRAPHIC STUDIES: I have personally reviewed the radiological images as listed and agreed with the findings in the  report. No results found.   Assessment and plan Patient is a 55 y.o.  female female with history of morbid obesity status post laparoscopic sleeve gastrectomy on 01/29/2018, diabetes, hypertension, presented to the emergency room for evaluation of syncope episodes.  Status post cardiac cath which showed increased pulmonary hypertension.  Normal coronary artery disease.  VQ scan showed high probability of PE.  1. Iron deficiency anemia due to chronic blood loss   2. Vitamin B12 deficiency   3. H/O bariatric surgery   4. History of pulmonary embolism     #Pulmonary embolism, continue Eliquis 2.31m BID.  She tolerates well. Review 610-monthupply  # Iron deficiency anemia, in the context of gastric sleeve Labs reviewed and discussed with patient.  Counts are stable No need for IV Venofer today  #Vitamin B12 deficiency.  Continue monthly parenteral vitamin b12 100045minjections.  #History of bariatric surgery/gastric sleeve.  At the risk of vitamin deficiency. Monitor.  # mammogram images were reviewed and discussed We spent sufficient time to discuss many aspect of care, questions were answered to patient's satisfaction.  Orders Placed This Encounter  Procedures  . CBC with Differential/Platelet    Standing Status:   Future    Standing Expiration Date:   03/09/2021  . Ferritin    Standing Status:   Future    Standing Expiration Date:   03/09/2021  . Iron and TIBC    Standing Status:   Future    Standing Expiration Date:   03/09/2021  . Vitamin B12    Standing Status:   Future    Standing Expiration Date:   03/09/2021     RTC in 6 months.     ZhoEarlie ServerD, PhD Hematology Oncology [facility] [place]24/2022

## 2020-03-10 LAB — ZINC: Zinc: 96 ug/dL (ref 44–115)

## 2020-03-10 LAB — COPPER, SERUM: Copper: 142 ug/dL (ref 80–158)

## 2020-03-19 ENCOUNTER — Ambulatory Visit: Payer: BC Managed Care – PPO

## 2020-03-25 ENCOUNTER — Ambulatory Visit: Payer: Self-pay | Admitting: Neurology

## 2020-04-09 ENCOUNTER — Inpatient Hospital Stay: Payer: BC Managed Care – PPO | Attending: Oncology

## 2020-04-09 DIAGNOSIS — Z79899 Other long term (current) drug therapy: Secondary | ICD-10-CM | POA: Diagnosis not present

## 2020-04-09 DIAGNOSIS — Z9884 Bariatric surgery status: Secondary | ICD-10-CM | POA: Insufficient documentation

## 2020-04-09 DIAGNOSIS — D509 Iron deficiency anemia, unspecified: Secondary | ICD-10-CM | POA: Insufficient documentation

## 2020-04-09 DIAGNOSIS — E538 Deficiency of other specified B group vitamins: Secondary | ICD-10-CM | POA: Diagnosis not present

## 2020-04-09 DIAGNOSIS — Z86711 Personal history of pulmonary embolism: Secondary | ICD-10-CM | POA: Diagnosis not present

## 2020-04-09 DIAGNOSIS — D500 Iron deficiency anemia secondary to blood loss (chronic): Secondary | ICD-10-CM

## 2020-04-09 MED ORDER — CYANOCOBALAMIN 1000 MCG/ML IJ SOLN
1000.0000 ug | Freq: Once | INTRAMUSCULAR | Status: AC
  Administered 2020-04-09: 1000 ug via INTRAMUSCULAR
  Filled 2020-04-09: qty 1

## 2020-04-23 ENCOUNTER — Other Ambulatory Visit: Payer: Self-pay | Admitting: Internal Medicine

## 2020-05-06 ENCOUNTER — Ambulatory Visit: Payer: BC Managed Care – PPO | Admitting: Internal Medicine

## 2020-05-08 ENCOUNTER — Inpatient Hospital Stay: Payer: BC Managed Care – PPO | Attending: Oncology

## 2020-05-08 ENCOUNTER — Other Ambulatory Visit: Payer: Self-pay

## 2020-05-08 DIAGNOSIS — E538 Deficiency of other specified B group vitamins: Secondary | ICD-10-CM | POA: Insufficient documentation

## 2020-05-08 DIAGNOSIS — D500 Iron deficiency anemia secondary to blood loss (chronic): Secondary | ICD-10-CM

## 2020-05-08 DIAGNOSIS — D509 Iron deficiency anemia, unspecified: Secondary | ICD-10-CM | POA: Diagnosis present

## 2020-05-08 MED ORDER — CYANOCOBALAMIN 1000 MCG/ML IJ SOLN
1000.0000 ug | Freq: Once | INTRAMUSCULAR | Status: AC
  Administered 2020-05-08: 1000 ug via INTRAMUSCULAR
  Filled 2020-05-08: qty 1

## 2020-05-11 ENCOUNTER — Telehealth: Payer: Self-pay

## 2020-05-11 NOTE — Telephone Encounter (Signed)
Pt would like to set up an appt to get the Gel injections again.

## 2020-05-11 NOTE — Telephone Encounter (Signed)
Noted  

## 2020-05-20 ENCOUNTER — Other Ambulatory Visit: Payer: Self-pay

## 2020-05-20 ENCOUNTER — Ambulatory Visit
Admission: RE | Admit: 2020-05-20 | Discharge: 2020-05-20 | Disposition: A | Discharge status: Home / Self Care | Payer: BC Managed Care – PPO | Source: Ambulatory Visit | Attending: Otolaryngology | Admitting: Otolaryngology

## 2020-05-20 DIAGNOSIS — E041 Nontoxic single thyroid nodule: Secondary | ICD-10-CM | POA: Diagnosis present

## 2020-05-20 DIAGNOSIS — R809 Proteinuria, unspecified: Secondary | ICD-10-CM | POA: Insufficient documentation

## 2020-05-25 ENCOUNTER — Other Ambulatory Visit: Payer: Self-pay | Admitting: Otolaryngology

## 2020-05-25 DIAGNOSIS — E041 Nontoxic single thyroid nodule: Secondary | ICD-10-CM

## 2020-06-08 ENCOUNTER — Inpatient Hospital Stay: Payer: BC Managed Care – PPO | Attending: Oncology

## 2020-06-09 ENCOUNTER — Other Ambulatory Visit: Payer: Self-pay | Admitting: Internal Medicine

## 2020-06-22 ENCOUNTER — Telehealth: Payer: Self-pay

## 2020-06-22 NOTE — Telephone Encounter (Signed)
VOB has been submitted for Durolane, bilateral knee. Pending BV. 

## 2020-06-23 ENCOUNTER — Telehealth: Payer: Self-pay

## 2020-06-23 NOTE — Telephone Encounter (Signed)
Yes, she will need a follow-up appointment so these things can be addressed.

## 2020-06-23 NOTE — Telephone Encounter (Signed)
1) Has the patient experienced an inadequate response or adverse effects with non-pharmacologic treatment options (e.g, physical therapy, regular exercise, insoles, knee bracing and weight reduction?  (2) Has the patient experienced an inadequate response or intolerance to a trial of intraarticular steroid injections for at least 3 months?  (3) Is the diagnosis supported by radiologic evidence of osteoarthritis of the knee, such as joint space narrowing, subchondral sclerosis, osteophytes, and sub-chondral cysts?  PA is required for Durolane bilateral knee.  Additional info from above is required for approval. Patient has not been seen since last gel injection on 08/26/2019. Should patient make a F/U appointment?

## 2020-06-24 ENCOUNTER — Telehealth: Payer: Self-pay

## 2020-06-24 NOTE — Telephone Encounter (Signed)
Called and left a Vm for patient to call to make a F/U appointment with Dr. Ninfa Linden

## 2020-06-24 NOTE — Telephone Encounter (Signed)
See previous message in chart concerning making a F/U with Dr. Ninfa Linden.

## 2020-06-24 NOTE — Telephone Encounter (Signed)
Noted! Thank you

## 2020-07-03 IMAGING — US US RENAL
1 series · 14 of 25 positions shown · non-contrast
Comparison: None.

CLINICAL DATA: Acute renal failure.

EXAM:
RENAL / URINARY TRACT ULTRASOUND COMPLETE

[Series 1: us renal · 51 acquisitions, 14 frames shown]
[im 1/51]
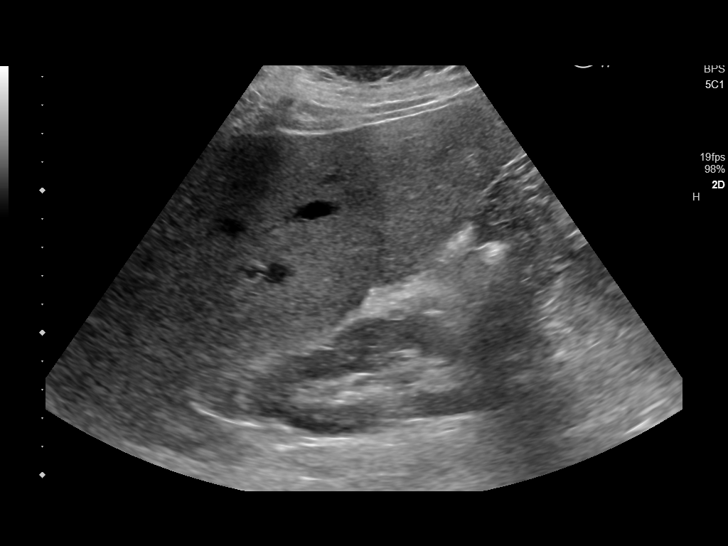
[im 5/51]
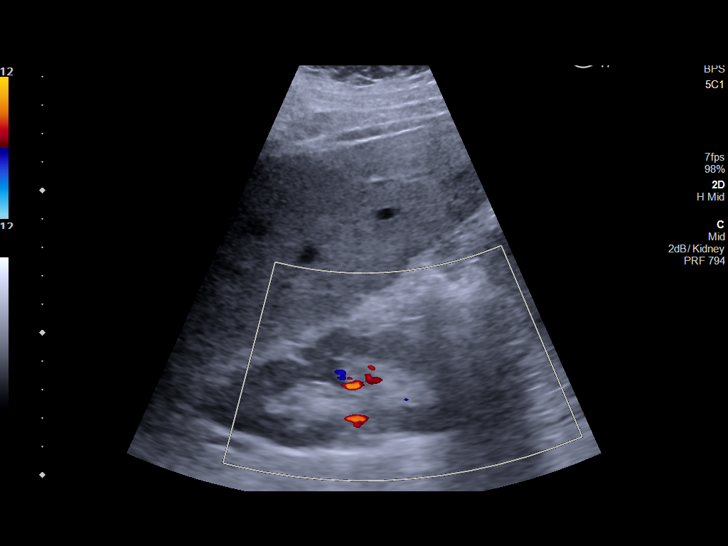
[im 9/51]
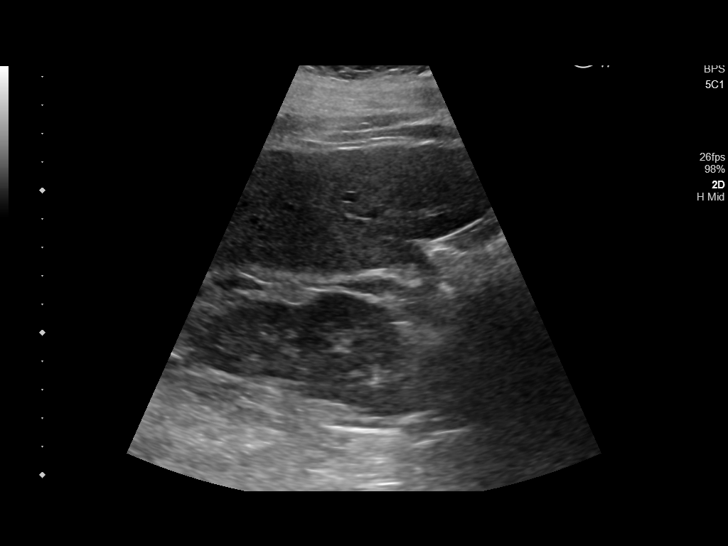
[im 13/51]
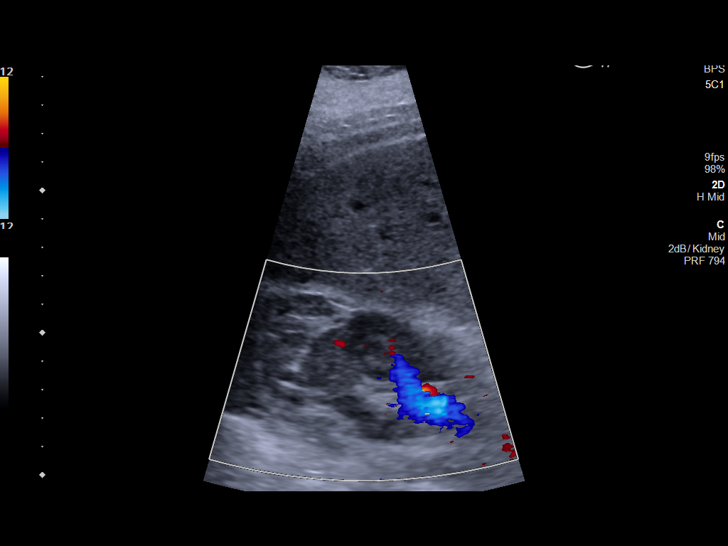
[im 17/51]
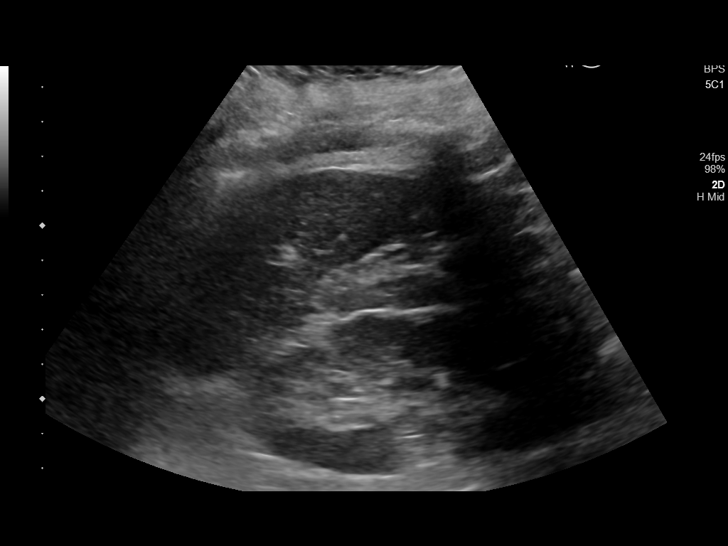
[im 19/51]
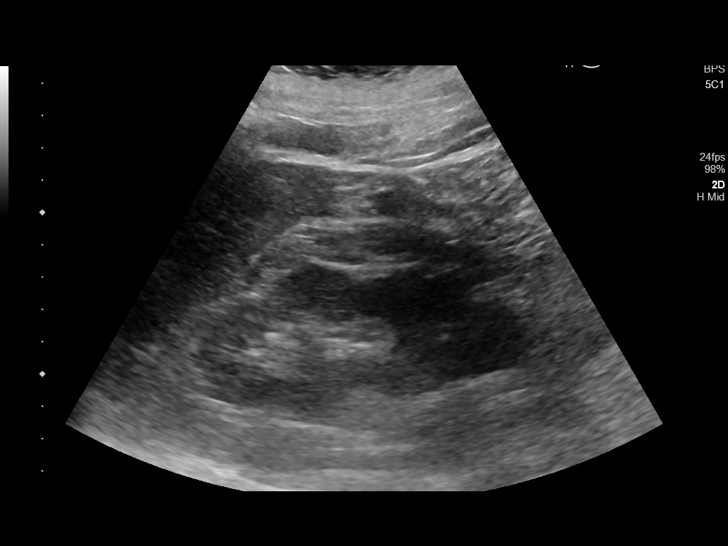
[im 23/51]
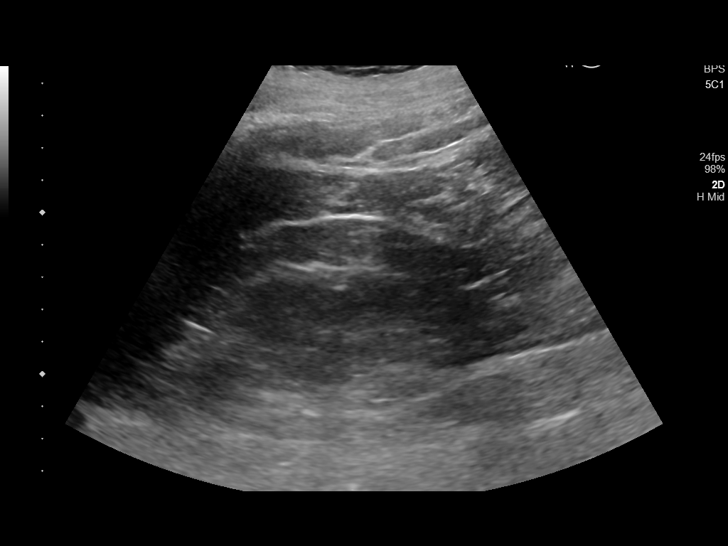
[im 28/51]
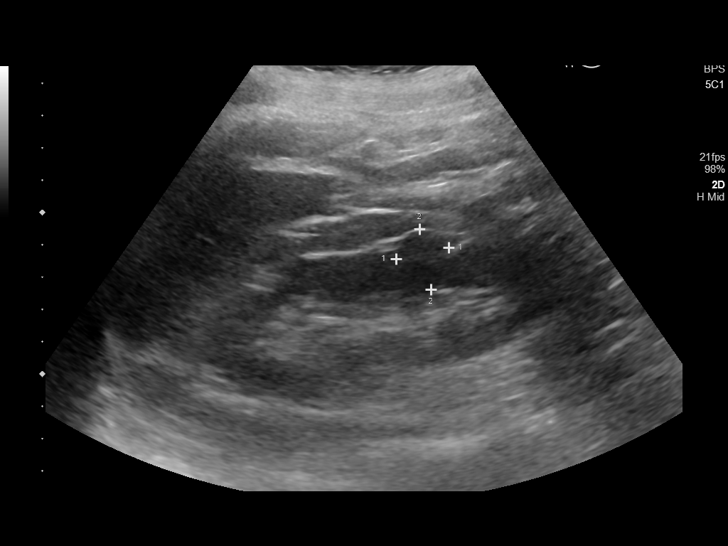
[im 32/51]
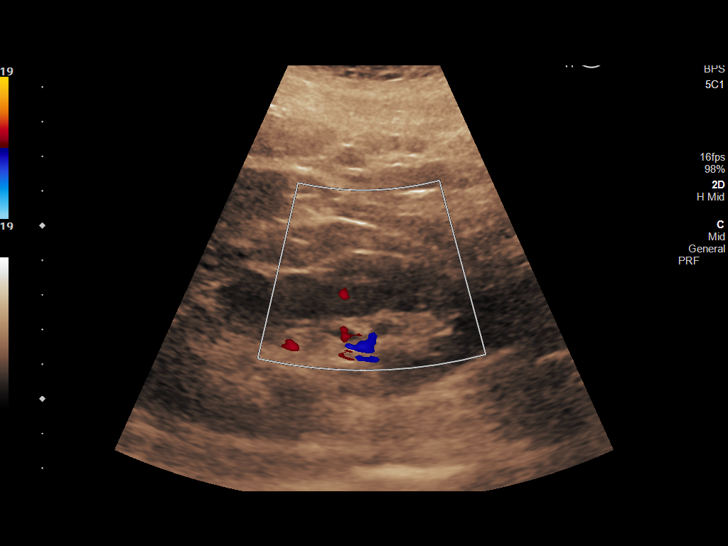
[im 34/51]
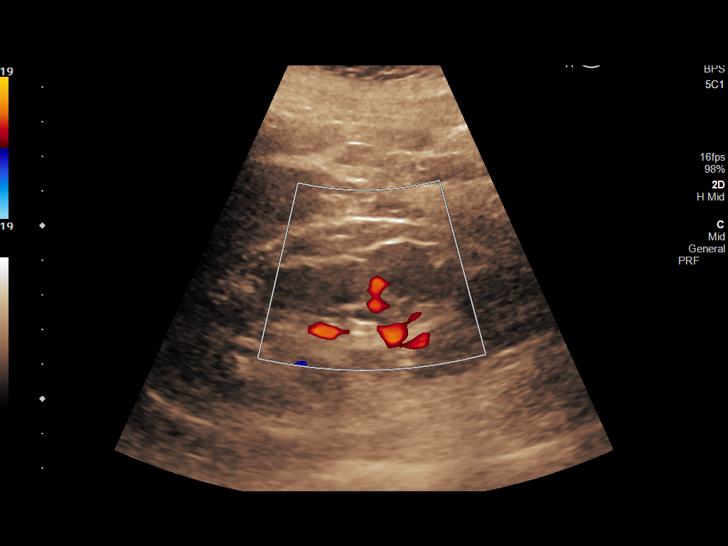
[im 38/51]
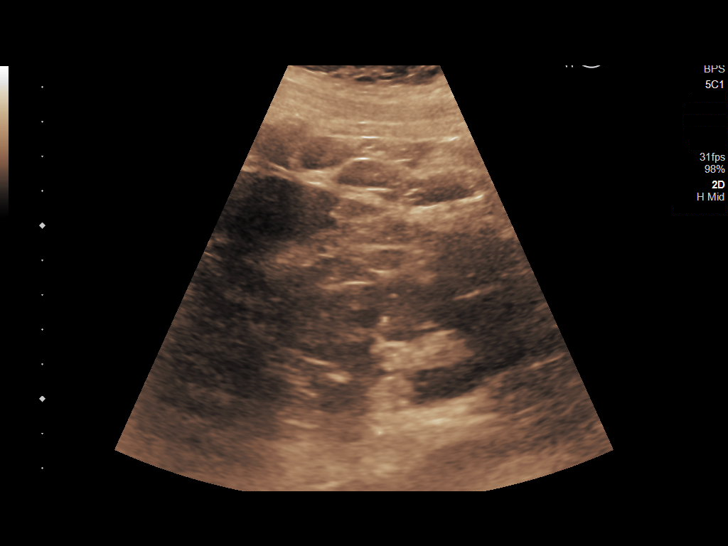
[im 42/51]
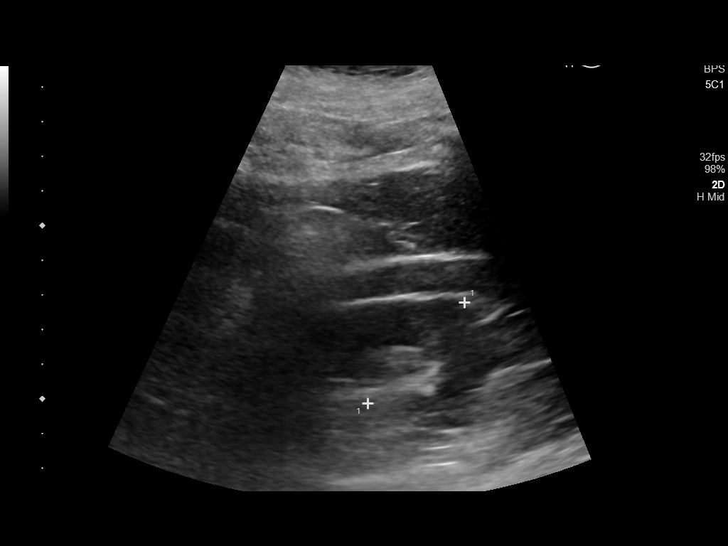
[im 46/51]
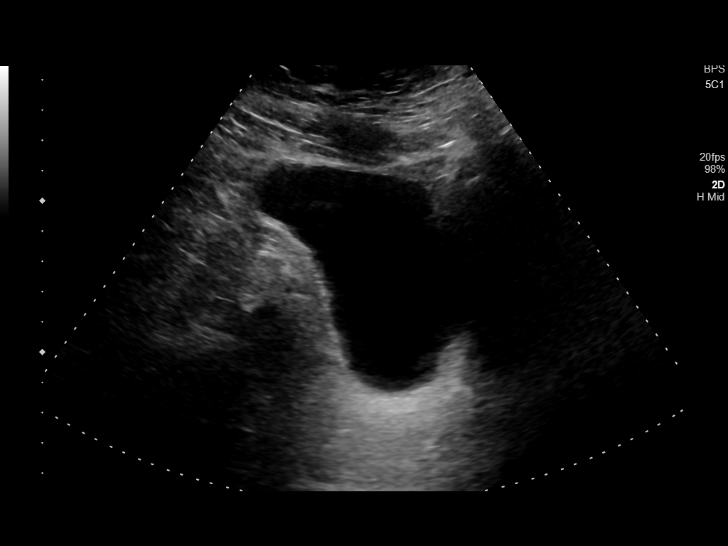
[im 51/51]
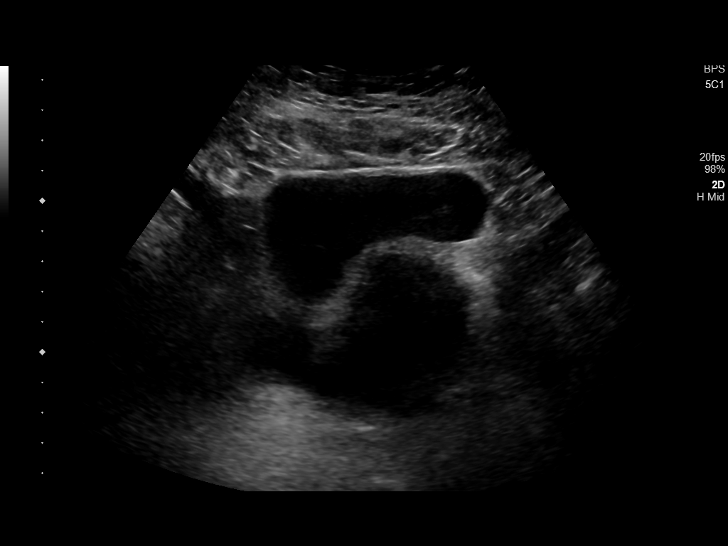

[14 of 25 positions shown; findings below may reference images not displayed]

FINDINGS: Right Kidney:

Renal measurements: 9.6 x 4.0 x 4.3 cm = volume: 85.7 mL .
Echogenicity within normal limits. No mass or hydronephrosis
visualized.

Left Kidney:

Renal measurements: 10.4 x 4.4 x 4.0 cm = volume: 96.9 mL. Normal
renal cortical thickness and echogenicity. No hydronephrosis. Within
the midpole of the left kidney there is a 1.8 x 1.9 x 1.9 cm
hypoechoic masslike structure.

Bladder:

Appears normal for degree of bladder distention.
IMPRESSION: No hydronephrosis.

Indeterminate hypoechoic masslike structure within the midpole of
the left kidney. Recommend dedicated evaluation with pre and post
contrast-enhanced CT when patient clinically able.

## 2020-07-09 ENCOUNTER — Other Ambulatory Visit: Payer: Self-pay

## 2020-07-09 ENCOUNTER — Encounter: Payer: Self-pay | Admitting: Internal Medicine

## 2020-07-09 MED ORDER — HYDROCHLOROTHIAZIDE 25 MG PO TABS: 1.0000 | ORAL_TABLET | Freq: Every day | ORAL | 1 refills | Status: DC

## 2020-07-14 ENCOUNTER — Inpatient Hospital Stay: Payer: BC Managed Care – PPO | Attending: Oncology

## 2020-08-10 ENCOUNTER — Inpatient Hospital Stay: Payer: BC Managed Care – PPO | Attending: Oncology

## 2020-08-10 ENCOUNTER — Other Ambulatory Visit: Payer: Self-pay

## 2020-08-10 DIAGNOSIS — E538 Deficiency of other specified B group vitamins: Secondary | ICD-10-CM | POA: Diagnosis present

## 2020-08-10 DIAGNOSIS — D509 Iron deficiency anemia, unspecified: Secondary | ICD-10-CM | POA: Diagnosis not present

## 2020-08-10 DIAGNOSIS — Z9884 Bariatric surgery status: Secondary | ICD-10-CM | POA: Diagnosis not present

## 2020-08-10 DIAGNOSIS — D500 Iron deficiency anemia secondary to blood loss (chronic): Secondary | ICD-10-CM

## 2020-08-10 MED ORDER — CYANOCOBALAMIN 1000 MCG/ML IJ SOLN
1000.0000 ug | Freq: Once | INTRAMUSCULAR | Status: AC
  Administered 2020-08-10: 1000 ug via INTRAMUSCULAR
  Filled 2020-08-10: qty 1

## 2020-08-26 ENCOUNTER — Ambulatory Visit: Payer: BC Managed Care – PPO | Admitting: Orthopaedic Surgery

## 2020-08-28 ENCOUNTER — Encounter (HOSPITAL_COMMUNITY): Payer: Self-pay | Admitting: *Deleted

## 2020-08-29 ENCOUNTER — Other Ambulatory Visit: Payer: Self-pay | Admitting: Internal Medicine

## 2020-08-31 ENCOUNTER — Ambulatory Visit (INDEPENDENT_AMBULATORY_CARE_PROVIDER_SITE_OTHER): Payer: BC Managed Care – PPO | Admitting: Physician Assistant

## 2020-08-31 ENCOUNTER — Encounter: Payer: Self-pay | Admitting: Physician Assistant

## 2020-08-31 ENCOUNTER — Ambulatory Visit: Payer: Self-pay

## 2020-08-31 VITALS — Ht 65.0 in | Wt 284.4 lb

## 2020-08-31 DIAGNOSIS — M1711 Unilateral primary osteoarthritis, right knee: Secondary | ICD-10-CM

## 2020-08-31 DIAGNOSIS — M1712 Unilateral primary osteoarthritis, left knee: Secondary | ICD-10-CM | POA: Diagnosis not present

## 2020-08-31 MED ORDER — LIDOCAINE HCL 1 % IJ SOLN
3.0000 mL | INTRAMUSCULAR | Status: AC | PRN
  Administered 2020-08-31: 3 mL

## 2020-08-31 MED ORDER — METHYLPREDNISOLONE ACETATE 40 MG/ML IJ SUSP
40.0000 mg | INTRAMUSCULAR | Status: AC | PRN
  Administered 2020-08-31: 40 mg via INTRA_ARTICULAR

## 2020-08-31 NOTE — Progress Notes (Signed)
HPI: Kristen [name] comes in today for bilateral knee pain.  She has known tricompartmental arthritis of both knees.  She has tried cortisone in the past that has been short-lived in regards to relief.  Currently the right knee pain is worse than the left.  She is unable to take NSAIDs due to history of gastric bypass in December 2019.  She has tried topical gels and Tylenol for the pain in her knees.  She has tried no braces.  She had therapy after her gastric bypass surgery.  She is done well in the past with supplemental injections.  She is wanting to repeat the supplemental injections in her knees when available.  Last injection 08/26/2019 with Durolane.  Reports that her last hemoglobin A1c was around 5.9.  Review of systems see HPI otherwise negative  Physical exam: General: Well-developed well-nourished female no acute distress mood and affect appropriate. Psych: Alert and oriented x3. Bilateral knees: Good range of motion of both knees with slight hyperextension of both knees.  Significant crepitus right knee with passive range of motion.  Left knee with slight crepitus with passive range of motion.  No gross instability with valgus varus stressing of either knee.  No knee abnormal warmth erythema or effusion of either knee.  Tenderness along medial joint line of the right knee only.  Radiographs: Right knee shows tricompartmental arthritis with near bone-on-bone medial compartment.  Severe patellofemoral arthritis with osteophytes off the articular surface.  Osteophytes off the lateral tibial plateau consistent with mild to moderate arthritic changes. Left knee: Tricompartmental changes mild osteophytes lateral tibial plateau.  Severe patellofemoral changes with osteophytes off the articular surface.  Medial compartment with near bone-on-bone arthritis.  Impression: Bilateral knee tricompartmental arthritis  Plan: We will try to gain approval for supplemental injection both knees.  Patient is  diabetic unable to take NSAIDs.  She did ask if there is any way possible to do a cortisone injection in the right knee today.  We were able to perform this today and she tolerated this well.  She is hoping that this will give her some relief until the supplemental injections can be approved.  See her back once these are available.         Procedure Note  Patient: Kristen [name]             Date of Birth: October 09, 1965           MRN: [id number]             Visit Date: 08/31/2020  Procedures: Visit Diagnoses:  1. Primary osteoarthritis of left knee   2. Unilateral primary osteoarthritis, right knee     Large Joint Inj: R knee on 08/31/2020 4:26 PM Indications: pain Details: 22 G 1.5 in needle, anterolateral approach  Arthrogram: No  Medications: 3 mL lidocaine 1 %; 40 mg methylPREDNISolone acetate 40 MG/ML Outcome: tolerated well, no immediate complications Procedure, treatment alternatives, risks and benefits explained, specific risks discussed. Consent was given by the patient. Immediately prior to procedure a time out was called to verify the correct patient, procedure, equipment, support staff and site/side marked as required. Patient was prepped and draped in the usual sterile fashion.

## 2020-09-04 ENCOUNTER — Other Ambulatory Visit: Payer: Self-pay | Admitting: Internal Medicine

## 2020-09-07 ENCOUNTER — Inpatient Hospital Stay: Payer: BC Managed Care – PPO | Attending: Oncology

## 2020-09-07 ENCOUNTER — Inpatient Hospital Stay: Payer: BC Managed Care – PPO

## 2020-09-07 ENCOUNTER — Other Ambulatory Visit: Payer: Self-pay

## 2020-09-07 DIAGNOSIS — Z7901 Long term (current) use of anticoagulants: Secondary | ICD-10-CM | POA: Diagnosis not present

## 2020-09-07 DIAGNOSIS — Z9884 Bariatric surgery status: Secondary | ICD-10-CM | POA: Insufficient documentation

## 2020-09-07 DIAGNOSIS — Z823 Family history of stroke: Secondary | ICD-10-CM | POA: Insufficient documentation

## 2020-09-07 DIAGNOSIS — Z833 Family history of diabetes mellitus: Secondary | ICD-10-CM | POA: Insufficient documentation

## 2020-09-07 DIAGNOSIS — I252 Old myocardial infarction: Secondary | ICD-10-CM | POA: Diagnosis not present

## 2020-09-07 DIAGNOSIS — E538 Deficiency of other specified B group vitamins: Secondary | ICD-10-CM

## 2020-09-07 DIAGNOSIS — Z8249 Family history of ischemic heart disease and other diseases of the circulatory system: Secondary | ICD-10-CM | POA: Insufficient documentation

## 2020-09-07 DIAGNOSIS — Z79899 Other long term (current) drug therapy: Secondary | ICD-10-CM | POA: Diagnosis not present

## 2020-09-07 DIAGNOSIS — D500 Iron deficiency anemia secondary to blood loss (chronic): Secondary | ICD-10-CM

## 2020-09-07 DIAGNOSIS — Z836 Family history of other diseases of the respiratory system: Secondary | ICD-10-CM | POA: Diagnosis not present

## 2020-09-07 DIAGNOSIS — D509 Iron deficiency anemia, unspecified: Secondary | ICD-10-CM | POA: Insufficient documentation

## 2020-09-07 DIAGNOSIS — Z880 Allergy status to penicillin: Secondary | ICD-10-CM | POA: Diagnosis not present

## 2020-09-07 DIAGNOSIS — Z86711 Personal history of pulmonary embolism: Secondary | ICD-10-CM | POA: Insufficient documentation

## 2020-09-07 LAB — CBC WITH DIFFERENTIAL/PLATELET
Abs Immature Granulocytes: 0.02 10*3/uL (ref 0.00–0.07)
Basophils Absolute: 0 10*3/uL (ref 0.0–0.1)
Basophils Relative: 0 %
Eosinophils Absolute: 0.1 10*3/uL (ref 0.0–0.5)
Eosinophils Relative: 1 %
HCT: 34 % — ABNORMAL LOW (ref 36.0–46.0)
Hemoglobin: 11 g/dL — ABNORMAL LOW (ref 12.0–15.0)
Immature Granulocytes: 0 %
Lymphocytes Relative: 33 %
Lymphs Abs: 2.6 10*3/uL (ref 0.7–4.0)
MCH: 29.7 pg (ref 26.0–34.0)
MCHC: 32.4 g/dL (ref 30.0–36.0)
MCV: 91.9 fL (ref 80.0–100.0)
Monocytes Absolute: 0.7 10*3/uL (ref 0.1–1.0)
Monocytes Relative: 9 %
Neutro Abs: 4.6 10*3/uL (ref 1.7–7.7)
Neutrophils Relative %: 57 %
Platelets: 300 10*3/uL (ref 150–400)
RBC: 3.7 MIL/uL — ABNORMAL LOW (ref 3.87–5.11)
RDW: 14.2 % (ref 11.5–15.5)
WBC: 8 10*3/uL (ref 4.0–10.5)
nRBC: 0 % (ref 0.0–0.2)

## 2020-09-07 LAB — IRON AND TIBC
Iron: 114 ug/dL (ref 28–170)
Saturation Ratios: 37 % — ABNORMAL HIGH (ref 10.4–31.8)
TIBC: 307 ug/dL (ref 250–450)
UIBC: 193 ug/dL

## 2020-09-07 LAB — FERRITIN: Ferritin: 196 ng/mL (ref 11–307)

## 2020-09-07 LAB — VITAMIN B12: Vitamin B-12: 342 pg/mL (ref 180–914)

## 2020-09-07 MED ORDER — CYANOCOBALAMIN 1000 MCG/ML IJ SOLN
1000.0000 ug | Freq: Once | INTRAMUSCULAR | Status: AC
  Administered 2020-09-07: 1000 ug via INTRAMUSCULAR
  Filled 2020-09-07: qty 1

## 2020-09-09 ENCOUNTER — Encounter: Payer: Self-pay | Admitting: Oncology

## 2020-09-09 ENCOUNTER — Other Ambulatory Visit: Payer: Self-pay

## 2020-09-09 ENCOUNTER — Inpatient Hospital Stay (HOSPITAL_BASED_OUTPATIENT_CLINIC_OR_DEPARTMENT_OTHER): Payer: BC Managed Care – PPO | Admitting: Oncology

## 2020-09-09 DIAGNOSIS — D500 Iron deficiency anemia secondary to blood loss (chronic): Secondary | ICD-10-CM | POA: Diagnosis not present

## 2020-09-09 DIAGNOSIS — Z9884 Bariatric surgery status: Secondary | ICD-10-CM

## 2020-09-09 DIAGNOSIS — Z86711 Personal history of pulmonary embolism: Secondary | ICD-10-CM

## 2020-09-09 DIAGNOSIS — D509 Iron deficiency anemia, unspecified: Secondary | ICD-10-CM | POA: Diagnosis not present

## 2020-09-09 DIAGNOSIS — E538 Deficiency of other specified B group vitamins: Secondary | ICD-10-CM

## 2020-09-09 MED ORDER — APIXABAN 2.5 MG PO TABS: 2.5000 mg | ORAL_TABLET | Freq: Two times a day (BID) | ORAL | 5 refills | Status: DC

## 2020-09-09 NOTE — Progress Notes (Signed)
RN called and verified pt name and date of birth.  Pt states doing well, just normal arthritic pain in knees.

## 2020-09-09 NOTE — Progress Notes (Signed)
HEMATOLOGY-ONCOLOGY TeleHEALTH VISIT PROGRESS NOTE  I connected with Kristen [name] on 09/09/20 at  1:45 PM EDT by video enabled telemedicine visit and verified that I am speaking with the correct person using two identifiers. I discussed the limitations, risks, security and privacy concerns of performing an evaluation and management service by telemedicine and the availability of in-person appointments. I also discussed with the patient that there may be a patient responsible charge related to this service. The patient expressed understanding and agreed to proceed.   Other persons participating in the visit and their role in the encounter:  None  Patient's location: Home  Provider's location: office Chief Complaint: Iron deficiency anemia, vitamin B12 deficiency, history of bariatric surgery.   INTERVAL HISTORY Kristen [name] is a 55 y.o. female who has above history reviewed by me today presents for follow up visit for management of iron deficiency anemia, vitamin B12 deficiency, history of bariatric surgery. Problems and complaints are listed below:  I attempted to connect the patient for visual enabled telehealth visit.  Due to the technical difficulties with video,  Patient was transitioned to audio only visit. Patient reports feeling well.  No new complaints. Denies any bleeding events.  She takes Eliquis 2.5 mg twice daily.  No shortness of breath, chest pain lower extremity swelling. Review of Systems  Constitutional:  Negative for appetite change, chills, fatigue and fever.  HENT:   Negative for hearing loss and voice change.   Eyes:  Negative for eye problems.  Respiratory:  Negative for chest tightness and cough.   Cardiovascular:  Negative for chest pain.  Gastrointestinal:  Negative for abdominal distention, abdominal pain and blood in stool.  Endocrine: Negative for hot flashes.  Genitourinary:  Negative for difficulty urinating and frequency.   Musculoskeletal:   Negative for arthralgias.  Skin:  Negative for itching and rash.  Neurological:  Negative for extremity weakness.  Hematological:  Negative for adenopathy.  Psychiatric/Behavioral:  Negative for confusion.    Past Medical History:  Diagnosis Date   Arthritis    B12 deficiency    Diabetes ([place])    Type 2   Hypertension    Iron deficiency anemia 03/07/2018   Non-STEMI (non-ST elevated myocardial infarction) ([place]) 2020   Pulmonary embolism ([facility])    URI (upper respiratory infection)    took antibiotic and prednisone oct 2019 resolved   Uses contact lenses    Past Surgical History:  Procedure Laterality Date   BREAST BIOPSY Right 2015   COLONOSCOPY WITH PROPOFOL N/A 03/18/2016   Procedure: COLONOSCOPY WITH PROPOFOL;  Surgeon: Carol Ada, MD;  Location: [facility];  Service: Endoscopy;  Laterality: N/A;   LAPAROSCOPIC GASTRIC SLEEVE RESECTION N/A 01/29/2018   Procedure: LAPAROSCOPIC GASTRIC SLEEVE RESECTION WITH UPPER ENDO AND ERAS PATHWAY;  Surgeon: Kinsinger, Arta Bruce, MD;  Location: [facility];  Service: General;  Laterality: N/A;   RIGHT/LEFT HEART CATH AND CORONARY ANGIOGRAPHY N/A 02/15/2018   Procedure: RIGHT/LEFT HEART CATH AND CORONARY ANGIOGRAPHY;  Surgeon: Corey Skains, MD;  Location: [facility];  Service: Cardiovascular;  Laterality: N/A;   TOTAL HIP ARTHROPLASTY Right 08/04/2017   Procedure: RIGHT TOTAL HIP ARTHROPLASTY ANTERIOR APPROACH;  Surgeon: Mcarthur Rossetti, MD;  Location: [facility];  Service: Orthopedics;  Laterality: Right;    Family History  Problem Relation Age of Onset   Hypertension Mother    Diabetes Mother    Hypertension Father    Diabetes Father    COPD Father    Stroke  Father    Congestive Heart Failure Father    Diabetes Sister    Hypertension Sister     Social History   Socioeconomic History   Marital status: Married    Spouse name: Not on file   Number of children: Not on file   Years of education: Not on file   Highest  education level: Not on file  Occupational History   Not on file  Tobacco Use   Smoking status: Never   Smokeless tobacco: Never  Vaping Use   Vaping Use: Never used  Substance and Sexual Activity   Alcohol use: Not Currently    Comment: OCCASIONAL    Drug use: No   Sexual activity: Yes    Birth control/protection: None  Other Topics Concern   Not on file  Social History Narrative   Not on file   Social Determinants of Health   Financial Resource Strain: Not on file  Food Insecurity: Not on file  Transportation Needs: Not on file  Physical Activity: Not on file  Stress: Not on file  Social Connections: Not on file  Intimate Partner Violence: Not on file    Current Outpatient Medications on File Prior to Visit  Medication Sig Dispense Refill   acetaminophen (TYLENOL) 500 MG tablet Take 1 tablet (500 mg total) by mouth every 6 (six) hours as needed. 30 tablet 0   albuterol (PROVENTIL HFA;VENTOLIN HFA) 108 (90 Base) MCG/ACT inhaler Inhale 1-2 puffs into the lungs every 6 (six) hours as needed for wheezing or shortness of breath. 1 Inhaler 2   atorvastatin (LIPITOR) 20 MG tablet TAKE 1 TABLET BY MOUTH EVERY DAY 90 tablet 3   blood glucose meter kit and supplies KIT Dispense based on patient and insurance preference. Use up to four times daily as directed. (FOR ICD-9 250.00, 250.01). 1 each 0   cetirizine (ZYRTEC) 10 MG tablet Take 10 mg by mouth 2 (two) times daily. Pt takes twice a day     cyanocobalamin (,VITAMIN B-12,) 1000 MCG/ML injection Inject into the muscle.     docusate sodium (COLACE) 100 MG capsule Take 100 mg by mouth 2 (two) times daily.     FLUoxetine (PROZAC) 20 MG capsule TAKE 1 CAPSULE BY MOUTH EVERY DAY 90 capsule 1   hydrochlorothiazide (HYDRODIURIL) 25 MG tablet Take 1 tablet (25 mg total) by mouth daily. 90 tablet 1   metFORMIN (GLUCOPHAGE-XR) 500 MG 24 hr tablet TAKE 1 TABLET BY MOUTH EVERY DAY 90 tablet 1   NONFORMULARY OR COMPOUNDED ITEM Iron patch apply  every 8 hours     olmesartan (BENICAR) 40 MG tablet Take 40 mg by mouth daily.     Semaglutide (RYBELSUS) 14 MG TABS Take 14 mg by mouth daily. 90 tablet 1   Vitamin D, Ergocalciferol, (DRISDOL) 1.25 MG (50000 UNIT) CAPS capsule TAKE ONE CAPSULE BY MOUTH TWICE WEEKLY ON TUES/FRIDAYS 24 capsule 1   No current facility-administered medications on file prior to visit.    Allergies  Allergen Reactions   Amoxicillin Hives    Has patient had a PCN reaction causing immediate rash, facial/tongue/throat swelling, SOB or lightheadedness with hypotension: No Has patient had a PCN reaction causing severe rash involving mucus membranes or skin necrosis: Yes Has patient had a PCN reaction that required hospitalization No Has patient had a PCN reaction occurring within the last 10 years: No If all of the above answers are "NO", then may proceed with Cephalosporin use.    Fish Allergy Hives  Observations/Objective: Today's Vitals   09/09/20 1117  PainSc: 0-No pain   There is no height or weight on file to calculate BMI.  Physical Exam  CBC    Component Value Date/Time   WBC 8.0 09/07/2020 1308   RBC 3.70 (L) 09/07/2020 1308   HGB 11.0 (L) 09/07/2020 1308   HGB 10.9 (L) 01/19/2018 1201   HCT 34.0 (L) 09/07/2020 1308   HCT 33.1 (L) 01/19/2018 1201   PLT 300 09/07/2020 1308   PLT 602 (H) 01/19/2018 1201   MCV 91.9 09/07/2020 1308   MCV 76 (L) 01/19/2018 1201   MCH 29.7 09/07/2020 1308   MCHC 32.4 09/07/2020 1308   RDW 14.2 09/07/2020 1308   RDW 16.4 (H) 01/19/2018 1201   LYMPHSABS 2.6 09/07/2020 1308   MONOABS 0.7 09/07/2020 1308   EOSABS 0.1 09/07/2020 1308   BASOSABS 0.0 09/07/2020 1308    CMP     Component Value Date/Time   NA 137 03/06/2020 1340   NA 144 01/07/2020 1530   K 4.1 03/06/2020 1340   CL 101 03/06/2020 1340   CO2 33 (H) 03/06/2020 1340   GLUCOSE 140 (H) 03/06/2020 1340   BUN 22 (H) 03/06/2020 1340   BUN 13 01/07/2020 1530   CREATININE 1.08 (H) 03/06/2020  1340   CALCIUM 9.1 03/06/2020 1340   PROT 8.0 03/06/2020 1340   PROT 8.4 10/09/2019 1543   ALBUMIN 4.0 03/06/2020 1340   ALBUMIN 4.7 10/09/2019 1543   AST 27 03/06/2020 1340   ALT 19 03/06/2020 1340   ALKPHOS 76 03/06/2020 1340   BILITOT 0.4 03/06/2020 1340   BILITOT 0.4 10/09/2019 1543   GFRNONAA >60 03/06/2020 1340   GFRAA 78 01/07/2020 1530     Assessment and Plan: 1. Iron deficiency anemia due to chronic blood loss   2. Vitamin B12 deficiency   3. H/O bariatric surgery   4. History of pulmonary embolism     #History of bariatric surgery, iron deficiency anemia Labs reviewed and discussed with patient. Hemoglobin slightly decreased to 11.  She is asymptomatic Iron panel is not consistent with iron deficiency. Hold off additional IV Venofer treatment today  #History of pulmonary embolism, continue Eliquis 2.5 mg twice daily. #History of vitamin B12 deficiency, B12 is 342.  Continue vitamin B12 injection monthly.   Follow Up Instructions: 6 months   I discussed the assessment and treatment plan with the patient. The patient was provided an opportunity to ask questions and all were answered. The patient agreed with the plan and demonstrated an understanding of the instructions.  The patient was advised to call back or seek an in-person evaluation if the symptoms worsen or if the condition fails to improve as anticipated.    Earlie Server, MD 09/09/2020 8:57 PM

## 2020-10-01 ENCOUNTER — Other Ambulatory Visit: Payer: Self-pay | Admitting: Internal Medicine

## 2020-10-07 ENCOUNTER — Inpatient Hospital Stay: Payer: BC Managed Care – PPO | Attending: Oncology

## 2020-10-07 DIAGNOSIS — E538 Deficiency of other specified B group vitamins: Secondary | ICD-10-CM | POA: Insufficient documentation

## 2020-10-07 DIAGNOSIS — D509 Iron deficiency anemia, unspecified: Secondary | ICD-10-CM | POA: Insufficient documentation

## 2020-10-07 DIAGNOSIS — D500 Iron deficiency anemia secondary to blood loss (chronic): Secondary | ICD-10-CM

## 2020-10-07 MED ORDER — CYANOCOBALAMIN 1000 MCG/ML IJ SOLN
1000.0000 ug | Freq: Once | INTRAMUSCULAR | Status: AC
  Administered 2020-10-07: 1000 ug via INTRAMUSCULAR
  Filled 2020-10-07: qty 1

## 2020-10-12 ENCOUNTER — Inpatient Hospital Stay: Payer: BC Managed Care – PPO

## 2020-10-20 ENCOUNTER — Encounter: Payer: BC Managed Care – PPO | Admitting: Internal Medicine

## 2020-10-26 ENCOUNTER — Encounter: Payer: Self-pay | Admitting: Nurse Practitioner

## 2020-10-26 ENCOUNTER — Other Ambulatory Visit: Payer: Self-pay

## 2020-10-26 ENCOUNTER — Ambulatory Visit (INDEPENDENT_AMBULATORY_CARE_PROVIDER_SITE_OTHER): Payer: BC Managed Care – PPO | Admitting: Nurse Practitioner

## 2020-10-26 VITALS — BP 130/82 | HR 92 | Temp 98.5°F | Ht 64.2 in | Wt 282.2 lb

## 2020-10-26 DIAGNOSIS — Z0000 Encounter for general adult medical examination without abnormal findings: Secondary | ICD-10-CM

## 2020-10-26 DIAGNOSIS — E119 Type 2 diabetes mellitus without complications: Secondary | ICD-10-CM

## 2020-10-26 DIAGNOSIS — Z23 Encounter for immunization: Secondary | ICD-10-CM | POA: Diagnosis not present

## 2020-10-26 DIAGNOSIS — E538 Deficiency of other specified B group vitamins: Secondary | ICD-10-CM

## 2020-10-26 DIAGNOSIS — I10 Essential (primary) hypertension: Secondary | ICD-10-CM

## 2020-10-26 DIAGNOSIS — Z8616 Personal history of COVID-19: Secondary | ICD-10-CM

## 2020-10-26 DIAGNOSIS — Z6841 Body Mass Index (BMI) 40.0 and over, adult: Secondary | ICD-10-CM

## 2020-10-26 DIAGNOSIS — Z9884 Bariatric surgery status: Secondary | ICD-10-CM

## 2020-10-26 DIAGNOSIS — Z79899 Other long term (current) drug therapy: Secondary | ICD-10-CM

## 2020-10-26 LAB — POCT URINALYSIS DIPSTICK
Bilirubin, UA: NEGATIVE
Blood, UA: NEGATIVE
Glucose, UA: NEGATIVE
Ketones, UA: NEGATIVE
Leukocytes, UA: NEGATIVE
Nitrite, UA: NEGATIVE
Protein, UA: NEGATIVE
Spec Grav, UA: 1.025 (ref 1.010–1.025)
Urobilinogen, UA: 0.2 E.U./dL
pH, UA: 5.5 (ref 5.0–8.0)

## 2020-10-26 MED ORDER — SHINGRIX 50 MCG/0.5ML IM SUSR: 0.5000 mL | Freq: Once | INTRAMUSCULAR | 0 refills | Status: AC

## 2020-10-26 NOTE — Patient Instructions (Signed)
Health Maintenance, Female Adopting a healthy lifestyle and getting preventive care are important in promoting health and wellness. Ask your health care provider about: The right schedule for you to have regular tests and exams. Things you can do on your own to prevent diseases and keep yourself healthy. What should I know about diet, weight, and exercise? Eat a healthy diet  Eat a diet that includes plenty of vegetables, fruits, low-fat dairy products, and lean protein. Do not eat a lot of foods that are high in solid fats, added sugars, or sodium. Maintain a healthy weight Body mass index (BMI) is used to identify weight problems. It estimates body fat based on height and weight. Your health care provider can help determine your BMI and help you achieve or maintain a healthy weight. Get regular exercise Get regular exercise. This is one of the most important things you can do for your health. Most adults should: Exercise for at least 150 minutes each week. The exercise should increase your heart rate and make you sweat (moderate-intensity exercise). Do strengthening exercises at least twice a week. This is in addition to the moderate-intensity exercise. Spend less time sitting. Even light physical activity can be beneficial. Watch cholesterol and blood lipids Have your blood tested for lipids and cholesterol at 55 years of age, then have this test every 5 years. Have your cholesterol levels checked more often if: Your lipid or cholesterol levels are high. You are older than 55 years of age. You are at high risk for heart disease. What should I know about cancer screening? Depending on your health history and family history, you may need to have cancer screening at various ages. This may include screening for: Breast cancer. Cervical cancer. Colorectal cancer. Skin cancer. Lung cancer. What should I know about heart disease, diabetes, and high blood pressure? Blood pressure and heart  disease High blood pressure causes heart disease and increases the risk of stroke. This is more likely to develop in people who have high blood pressure readings, are of African descent, or are overweight. Have your blood pressure checked: Every 3-5 years if you are 18-39 years of age. Every year if you are 40 years old or older. Diabetes Have regular diabetes screenings. This checks your fasting blood sugar level. Have the screening done: Once every three years after age 40 if you are at a normal weight and have a low risk for diabetes. More often and at a younger age if you are overweight or have a high risk for diabetes. What should I know about preventing infection? Hepatitis B If you have a higher risk for hepatitis B, you should be screened for this virus. Talk with your health care provider to find out if you are at risk for hepatitis B infection. Hepatitis C Testing is recommended for: Everyone born from 1945 through 1965. Anyone with known risk factors for hepatitis C. Sexually transmitted infections (STIs) Get screened for STIs, including gonorrhea and chlamydia, if: You are sexually active and are younger than 55 years of age. You are older than 55 years of age and your health care provider tells you that you are at risk for this type of infection. Your sexual activity has changed since you were last screened, and you are at increased risk for chlamydia or gonorrhea. Ask your health care provider if you are at risk. Ask your health care provider about whether you are at high risk for HIV. Your health care provider may recommend a prescription medicine   to help prevent HIV infection. If you choose to take medicine to prevent HIV, you should first get tested for HIV. You should then be tested every 3 months for as long as you are taking the medicine. Pregnancy If you are about to stop having your period (premenopausal) and you may become pregnant, seek counseling before you get  pregnant. Take 400 to 800 micrograms (mcg) of folic acid every day if you become pregnant. Ask for birth control (contraception) if you want to prevent pregnancy. Osteoporosis and menopause Osteoporosis is a disease in which the bones lose minerals and strength with aging. This can result in bone fractures. If you are 65 years old or older, or if you are at risk for osteoporosis and fractures, ask your health care provider if you should: Be screened for bone loss. Take a calcium or vitamin D supplement to lower your risk of fractures. Be given hormone replacement therapy (HRT) to treat symptoms of menopause. Follow these instructions at home: Lifestyle Do not use any products that contain nicotine or tobacco, such as cigarettes, e-cigarettes, and chewing tobacco. If you need help quitting, ask your health care provider. Do not use street drugs. Do not share needles. Ask your health care provider for help if you need support or information about quitting drugs. Alcohol use Do not drink alcohol if: Your health care provider tells you not to drink. You are pregnant, may be pregnant, or are planning to become pregnant. If you drink alcohol: Limit how much you use to 0-1 drink a day. Limit intake if you are breastfeeding. Be aware of how much alcohol is in your drink. In the [place] one drink equals one 12 oz bottle of beer (355 mL), one 5 oz glass of wine (148 mL), or one 1 oz glass of hard liquor (44 mL). General instructions Schedule regular health, dental, and eye exams. Stay current with your vaccines. Tell your health care provider if: You often feel depressed. You have ever been abused or do not feel safe at home. Summary Adopting a healthy lifestyle and getting preventive care are important in promoting health and wellness. Follow your health care provider's instructions about healthy diet, exercising, and getting tested or screened for diseases. Follow your health care provider's  instructions on monitoring your cholesterol and blood pressure. This information is not intended to replace advice given to you by your health care provider. Make sure you discuss any questions you have with your health care provider. Document Revised: 04/10/2020 Document Reviewed: 01/24/2018 Elsevier Patient Education  2022 [employer]  

## 2020-10-26 NOTE — Progress Notes (Signed)
I,Kristen [name],acting as a Education administrator for [employer], FNP.,have documented all relevant documentation on the behalf of Kristen Brine, FNP,as directed by  Kristen Brine, FNP while in the presence of Kristen [name], [place].  This visit occurred during the SARS-CoV-2 public health emergency.  Safety protocols were in place, including screening questions prior to the visit, additional usage of staff PPE, and extensive cleaning of exam room while observing appropriate contact time as indicated for disinfecting solutions.  Subjective:     Patient ID: Kristen [name] , female    DOB: 22-Aug-1965 , 55 y.o.   MRN: [id number]   Chief Complaint  Patient presents with   Annual Exam    HPI  Patient is here for physical exam. She is followed by Dr Edilia Bo at [facility].  She is receiving vitamin B12 injections monthly with hematology  Wt Readings from Last 3 Encounters: 10/26/20 : 282 lb 3.2 oz (128 kg) 08/31/20 : 284 lb 6.4 oz (129 kg) 03/09/20 : 279 lb 9.6 oz (126.8 kg)  She is planning to consider loose skin, she is not as committed to exercise as much.   Diabetes She presents for her follow-up diabetic visit. She has type 2 diabetes mellitus. There are no hypoglycemic associated symptoms. Pertinent negatives for hypoglycemia include no dizziness or headaches. Pertinent negatives for diabetes include no chest pain. There are no hypoglycemic complications. Symptoms are stable. There are no diabetic complications. Risk factors for coronary artery disease include obesity and sedentary lifestyle. She is compliant with treatment all of the time. She participates in exercise intermittently. Her breakfast blood glucose is taken between 8-9 am. Her breakfast blood glucose range is generally 90-110 mg/dl. Eye exam is current.  Hypertension This is a chronic problem. The current episode started more than 1 year ago. The problem has been gradually improving since onset. The problem is controlled. Pertinent  negatives include no anxiety, chest pain, headaches or palpitations. There are no known risk factors for coronary artery disease. Past treatments include nothing. The current treatment provides no improvement. There are no compliance problems.  There is no history of angina. There is no history of chronic renal disease.    Past Medical History:  Diagnosis Date   Arthritis    B12 deficiency    Diabetes ([place])    Type 2   Hypertension    Iron deficiency anemia 03/07/2018   Non-STEMI (non-ST elevated myocardial infarction) ([facility]) 2020   Pulmonary embolism (HCC)    URI (upper respiratory infection)    took antibiotic and prednisone oct 2019 resolved   Uses contact lenses      Family History  Problem Relation Age of Onset   Hypertension Mother    Diabetes Mother    Hypertension Father    Diabetes Father    COPD Father    Stroke Father    Congestive Heart Failure Father    Diabetes Sister    Hypertension Sister      Current Outpatient Medications:    Zoster Vaccine Adjuvanted [facility]) injection, Inject 0.5 mLs into the muscle once for 1 dose., Disp: 0.5 mL, Rfl: 0   acetaminophen (TYLENOL) 500 MG tablet, Take 1 tablet (500 mg total) by mouth every 6 (six) hours as needed., Disp: 30 tablet, Rfl: 0   albuterol (PROVENTIL HFA;VENTOLIN HFA) 108 (90 Base) MCG/ACT inhaler, Inhale 1-2 puffs into the lungs every 6 (six) hours as needed for wheezing or shortness of breath., Disp: 1 Inhaler, Rfl: 2   apixaban (ELIQUIS) 2.5 MG  TABS tablet, Take 1 tablet (2.5 mg total) by mouth 2 (two) times daily., Disp: 60 tablet, Rfl: 5   atorvastatin (LIPITOR) 20 MG tablet, TAKE 1 TABLET BY MOUTH EVERY DAY, Disp: 90 tablet, Rfl: 3   blood glucose meter kit and supplies KIT, Dispense based on patient and insurance preference. Use up to four times daily as directed. (FOR ICD-9 250.00, 250.01)., Disp: 1 each, Rfl: 0   cetirizine (ZYRTEC) 10 MG tablet, Take 10 mg by mouth 2 (two) times daily. Pt takes twice a day,  Disp: , Rfl:    cyanocobalamin (,VITAMIN B-12,) 1000 MCG/ML injection, Inject into the muscle., Disp: , Rfl:    docusate sodium (COLACE) 100 MG capsule, Take 100 mg by mouth 2 (two) times daily., Disp: , Rfl:    FLUoxetine (PROZAC) 20 MG capsule, TAKE 1 CAPSULE BY MOUTH EVERY DAY, Disp: 90 capsule, Rfl: 1   hydrochlorothiazide (HYDRODIURIL) 25 MG tablet, Take 1 tablet (25 mg total) by mouth daily., Disp: 90 tablet, Rfl: 1   metFORMIN (GLUCOPHAGE-XR) 500 MG 24 hr tablet, TAKE 1 TABLET BY MOUTH EVERY DAY, Disp: 90 tablet, Rfl: 1   NONFORMULARY OR COMPOUNDED ITEM, Iron patch apply every 8 hours, Disp: , Rfl:    olmesartan (BENICAR) 40 MG tablet, Take 40 mg by mouth daily., Disp: , Rfl:    RYBELSUS 14 MG TABS, TAKE 1 TABLET BY MOUTH EVERY DAY, Disp: 90 tablet, Rfl: 1   Vitamin D, Ergocalciferol, (DRISDOL) 1.25 MG (50000 UNIT) CAPS capsule, TAKE ONE CAPSULE BY MOUTH TWICE WEEKLY ON TUES/FRIDAYS, Disp: 24 capsule, Rfl: 1   Allergies  Allergen Reactions   Amoxicillin Hives    Has patient had a PCN reaction causing immediate rash, facial/tongue/throat swelling, SOB or lightheadedness with hypotension: No Has patient had a PCN reaction causing severe rash involving mucus membranes or skin necrosis: Yes Has patient had a PCN reaction that required hospitalization No Has patient had a PCN reaction occurring within the last 10 years: No If all of the above answers are "NO", then may proceed with Cephalosporin use.    Fish Allergy Hives      The patient states she uses none for birth control. Last LMP was No LMP recorded. (Menstrual status: Irregular Periods).. Negative for Dysmenorrhea and Negative for Menorrhagia. Negative for: breast discharge, breast lump(s), breast pain and breast self exam. Associated symptoms include abnormal vaginal bleeding. Pertinent negatives include abnormal bleeding (hematology), anxiety, decreased libido, depression, difficulty falling sleep, dyspareunia, history of  infertility, nocturia, sexual dysfunction, sleep disturbances, urinary incontinence, urinary urgency, vaginal discharge and vaginal itching. Diet regular.The patient states her exercise level is minimal    The patient's tobacco use is:  Social History   Tobacco Use  Smoking Status Never  Smokeless Tobacco Never   She has been exposed to passive smoke. The patient's alcohol use is:  Social History   Substance and Sexual Activity  Alcohol Use Not Currently   Comment: OCCASIONAL    Additional information: Last pap 11/13/2018, next one scheduled for 11/12/2021.    Review of Systems  Constitutional: Negative.   HENT: Negative.    Eyes: Negative.   Respiratory: Negative.    Cardiovascular: Negative.  Negative for chest pain and palpitations.  Gastrointestinal: Negative.   Endocrine: Negative.   Genitourinary: Negative.   Musculoskeletal: Negative.   Skin: Negative.   Allergic/Immunologic: Negative.   Neurological: Negative.  Negative for dizziness and headaches.  Hematological: Negative.   Psychiatric/Behavioral: Negative.      Today's Vitals  10/26/20 1154  BP: 130/82  Pulse: 92  Temp: 98.5 F (36.9 C)  TempSrc: Oral  Weight: 282 lb 3.2 oz (128 kg)  Height: 5' 4.2" (1.631 m)   Body mass index is 48.14 kg/m.  Wt Readings from Last 3 Encounters:  10/26/20 282 lb 3.2 oz (128 kg)  08/31/20 284 lb 6.4 oz (129 kg)  03/09/20 279 lb 9.6 oz (126.8 kg)    Objective:  Physical Exam Constitutional:      General: She is not in acute distress.    Appearance: Normal appearance. She is well-developed. She is obese.  HENT:     Head: Normocephalic and atraumatic.     Right Ear: Hearing, tympanic membrane, ear canal and external ear normal. There is no impacted cerumen.     Left Ear: Hearing, tympanic membrane, ear canal and external ear normal. There is no impacted cerumen.     Nose:     Comments: Deferred - masked    Mouth/Throat:     Comments: Deferred - masked Eyes:      General: Lids are normal.     Extraocular Movements: Extraocular movements intact.     Conjunctiva/sclera: Conjunctivae normal.     Pupils: Pupils are equal, round, and reactive to light.     Funduscopic exam:    Right eye: No papilledema.        Left eye: No papilledema.  Neck:     Thyroid: No thyroid mass.     Vascular: No carotid bruit.  Cardiovascular:     Rate and Rhythm: Normal rate and regular rhythm.     Pulses: Normal pulses.     Heart sounds: Normal heart sounds. No murmur heard. Pulmonary:     Effort: Pulmonary effort is normal. No respiratory distress.     Breath sounds: Normal breath sounds. No wheezing.  Chest:     Chest wall: No mass.  Breasts:    Tanner Score is 5.     Right: Normal. No mass or tenderness.     Left: Normal. No mass or tenderness.  Abdominal:     General: Abdomen is flat. Bowel sounds are normal. There is no distension.     Palpations: Abdomen is soft.     Tenderness: There is no abdominal tenderness.  Genitourinary:    Comments: Deferred Musculoskeletal:        General: No swelling. Normal range of motion.     Cervical back: Full passive range of motion without pain, normal range of motion and neck supple.     Right lower leg: No edema.     Left lower leg: No edema.  Lymphadenopathy:     Upper Body:     Right upper body: No supraclavicular, axillary or pectoral adenopathy.     Left upper body: No supraclavicular, axillary or pectoral adenopathy.  Skin:    General: Skin is warm and dry.     Capillary Refill: Capillary refill takes less than 2 seconds.     Comments: She does have alopecia  Neurological:     General: No focal deficit present.     Mental Status: She is alert and oriented to person, place, and time.     Cranial Nerves: No cranial nerve deficit.     Sensory: No sensory deficit.     Motor: No weakness.  Psychiatric:        Mood and Affect: Mood normal.        Behavior: Behavior normal.  Thought Content: Thought  content normal.        Judgment: Judgment normal.        Assessment And Plan:     1. Health maintenance examination Behavior modifications discussed and diet history reviewed.   Pt will continue to exercise regularly and modify diet with low GI, plant based foods and decrease intake of processed foods.  Recommend intake of daily multivitamin, Vitamin D, and calcium.  Recommend mammogram and colonoscopy (up to date) for preventive screenings, as well as recommend immunizations that include influenza, TDAP and shingles  2. Essential hypertension Comments: Blood pressure is fairly controlled nephrology Rx's her blood pressure medication - POCT Urinalysis Dipstick (81002) - Microalbumin / Creatinine Urine Ratio - EKG 12-Lead - CMP14+EGFR  3. Type 2 diabetes mellitus without complication, without long-term current use of insulin (HCC) Comments: Stable, continue with Rybelsus, contnues to tolerate well - Hemoglobin A1c - CMP14+EGFR - Lipid panel  4. Vitamin B12 deficiency Comments: Receives vitamin B12 injections monthly at Hematology  5. Class 3 severe obesity due to excess calories without serious comorbidity with body mass index (BMI) of 45.0 to 49.9 in adult [facility]) She is encouraged to strive for BMI less than 30 to decrease cardiac risk. Advised to aim for at least 150 minutes of exercise per week.  6. History of COVID-19 Comments: Had 2 months ago before her birthday, had mild case with cough and fatigue.   7. Encounter for immunization - Flu Vaccine QUAD 6+ mos PF IM (Fluarix Quad PF) - Zoster Vaccine Adjuvanted [facility]) injection; Inject 0.5 mLs into the muscle once for 1 dose.  Dispense: 0.5 mL; Refill: 0  8. Other long term (current) drug therapy - CBC  9. H/O bariatric surgery Comments: Overall doing well, steady weight loss     Patient was given opportunity to ask questions. Patient verbalized understanding of the plan and was able to repeat key elements of the  plan. All questions were answered to their satisfaction.   Kristen Brine, FNP   I, Kristen Brine, FNP, have reviewed all documentation for this visit. The documentation on 10/26/20 for the exam, diagnosis, procedures, and orders are all accurate and complete.  THE PATIENT IS ENCOURAGED TO PRACTICE SOCIAL DISTANCING DUE TO THE COVID-19 PANDEMIC.

## 2020-10-27 LAB — CMP14+EGFR
ALT: 14 IU/L (ref 0–32)
AST: 23 IU/L (ref 0–40)
Albumin/Globulin Ratio: 1.2 (ref 1.2–2.2)
Albumin: 4.3 g/dL (ref 3.8–4.9)
Alkaline Phosphatase: 79 IU/L (ref 44–121)
BUN/Creatinine Ratio: 19 (ref 9–23)
BUN: 26 mg/dL — ABNORMAL HIGH (ref 6–24)
Bilirubin Total: 0.4 mg/dL (ref 0.0–1.2)
CO2: 22 mmol/L (ref 20–29)
Calcium: 8.8 mg/dL (ref 8.7–10.2)
Chloride: 104 mmol/L (ref 96–106)
Creatinine, Ser: 1.38 mg/dL — ABNORMAL HIGH (ref 0.57–1.00)
Globulin, Total: 3.5 g/dL (ref 1.5–4.5)
Glucose: 87 mg/dL (ref 65–99)
Potassium: 4.6 mmol/L (ref 3.5–5.2)
Sodium: 142 mmol/L (ref 134–144)
Total Protein: 7.8 g/dL (ref 6.0–8.5)
eGFR: 45 mL/min/{1.73_m2} — ABNORMAL LOW (ref >59)

## 2020-10-27 LAB — CBC
Hematocrit: 36.3 % (ref 34.0–46.6)
Hemoglobin: 12 g/dL (ref 11.1–15.9)
MCH: 29.2 pg (ref 26.6–33.0)
MCHC: 33.1 g/dL (ref 31.5–35.7)
MCV: 88 fL (ref 79–97)
Platelets: 343 10*3/uL (ref 150–450)
RBC: 4.11 x10E6/uL (ref 3.77–5.28)
RDW: 12.6 % (ref 11.7–15.4)
WBC: 5.7 10*3/uL (ref 3.4–10.8)

## 2020-10-27 LAB — LIPID PANEL
Chol/HDL Ratio: 3.4 ratio (ref 0.0–4.4)
Cholesterol, Total: 151 mg/dL (ref 100–199)
HDL: 44 mg/dL (ref >39)
LDL Chol Calc (NIH): 81 mg/dL (ref 0–99)
Triglycerides: 147 mg/dL (ref 0–149)
VLDL Cholesterol Cal: 26 mg/dL (ref 5–40)

## 2020-10-27 LAB — HEMOGLOBIN A1C
Est. average glucose Bld gHb Est-mCnc: 137 mg/dL
Hgb A1c MFr Bld: 6.4 % — ABNORMAL HIGH (ref 4.8–5.6)

## 2020-10-27 LAB — MICROALBUMIN / CREATININE URINE RATIO
Creatinine, Urine: 210.7 mg/dL
Microalb/Creat Ratio: 3 mg/g creat (ref 0–29)
Microalbumin, Urine: 5.5 ug/mL

## 2020-11-03 ENCOUNTER — Other Ambulatory Visit: Payer: Self-pay | Admitting: Internal Medicine

## 2020-11-11 ENCOUNTER — Inpatient Hospital Stay: Payer: BC Managed Care – PPO | Attending: Oncology

## 2020-11-11 DIAGNOSIS — E538 Deficiency of other specified B group vitamins: Secondary | ICD-10-CM | POA: Diagnosis present

## 2020-11-11 DIAGNOSIS — D500 Iron deficiency anemia secondary to blood loss (chronic): Secondary | ICD-10-CM

## 2020-11-11 DIAGNOSIS — D509 Iron deficiency anemia, unspecified: Secondary | ICD-10-CM | POA: Diagnosis present

## 2020-11-11 MED ORDER — CYANOCOBALAMIN 1000 MCG/ML IJ SOLN
1000.0000 ug | Freq: Once | INTRAMUSCULAR | Status: AC
  Administered 2020-11-11: 1000 ug via INTRAMUSCULAR
  Filled 2020-11-11: qty 1

## 2020-11-14 ENCOUNTER — Other Ambulatory Visit: Payer: Self-pay | Admitting: Internal Medicine

## 2020-11-15 ENCOUNTER — Other Ambulatory Visit: Payer: Self-pay | Admitting: Internal Medicine

## 2020-12-09 ENCOUNTER — Other Ambulatory Visit: Payer: Self-pay

## 2020-12-09 ENCOUNTER — Inpatient Hospital Stay: Payer: BC Managed Care – PPO | Attending: Oncology

## 2020-12-09 DIAGNOSIS — E538 Deficiency of other specified B group vitamins: Secondary | ICD-10-CM | POA: Insufficient documentation

## 2020-12-09 DIAGNOSIS — D500 Iron deficiency anemia secondary to blood loss (chronic): Secondary | ICD-10-CM

## 2020-12-09 DIAGNOSIS — D509 Iron deficiency anemia, unspecified: Secondary | ICD-10-CM | POA: Diagnosis present

## 2020-12-09 MED ORDER — CYANOCOBALAMIN 1000 MCG/ML IJ SOLN
1000.0000 ug | Freq: Once | INTRAMUSCULAR | Status: AC
  Administered 2020-12-09: 1000 ug via INTRAMUSCULAR
  Filled 2020-12-09: qty 1

## 2021-01-11 ENCOUNTER — Telehealth: Payer: Self-pay

## 2021-01-11 LAB — HM DIABETES EYE EXAM

## 2021-01-11 NOTE — Telephone Encounter (Signed)
Called and left a Vm for patient to CB to schedule for gel injection with Dr. Blackman 

## 2021-01-13 ENCOUNTER — Other Ambulatory Visit: Payer: Self-pay

## 2021-01-13 ENCOUNTER — Inpatient Hospital Stay: Payer: BC Managed Care – PPO | Attending: Oncology

## 2021-01-13 DIAGNOSIS — D500 Iron deficiency anemia secondary to blood loss (chronic): Secondary | ICD-10-CM

## 2021-01-13 DIAGNOSIS — E538 Deficiency of other specified B group vitamins: Secondary | ICD-10-CM | POA: Diagnosis present

## 2021-01-13 DIAGNOSIS — D509 Iron deficiency anemia, unspecified: Secondary | ICD-10-CM | POA: Insufficient documentation

## 2021-01-13 MED ORDER — CYANOCOBALAMIN 1000 MCG/ML IJ SOLN
1000.0000 ug | Freq: Once | INTRAMUSCULAR | Status: AC
  Administered 2021-01-13: 1000 ug via INTRAMUSCULAR
  Filled 2021-01-13: qty 1

## 2021-01-20 ENCOUNTER — Other Ambulatory Visit: Payer: Self-pay

## 2021-01-20 ENCOUNTER — Telehealth: Payer: Self-pay

## 2021-01-20 ENCOUNTER — Encounter: Payer: Self-pay | Admitting: Obstetrics and Gynecology

## 2021-01-20 ENCOUNTER — Ambulatory Visit (INDEPENDENT_AMBULATORY_CARE_PROVIDER_SITE_OTHER): Payer: BC Managed Care – PPO | Admitting: Obstetrics and Gynecology

## 2021-01-20 VITALS — BP 122/80 | Ht 64.5 in | Wt 279.0 lb

## 2021-01-20 DIAGNOSIS — Z01419 Encounter for gynecological examination (general) (routine) without abnormal findings: Secondary | ICD-10-CM | POA: Diagnosis not present

## 2021-01-20 DIAGNOSIS — N951 Menopausal and female climacteric states: Secondary | ICD-10-CM | POA: Diagnosis not present

## 2021-01-20 DIAGNOSIS — Z1231 Encounter for screening mammogram for malignant neoplasm of breast: Secondary | ICD-10-CM | POA: Diagnosis not present

## 2021-01-20 NOTE — Progress Notes (Signed)
PCP: Glendale Chard, MD   Chief Complaint  Patient presents with   Gynecologic Exam    No concerns    HPI:      Ms. Kristen [name] is a 55 y.o. No obstetric history on file. who LMP was Patient's last menstrual period was 01/18/2021 (approximate)., presents today for her annual examination.  Her menses are infrequent due to perimenopause. Has occas light blood tinge occas with breast tenderness. No real bleeding until a few days ago, flow is moderate. No BTB, no dsymen. No AUB. Does have occas vasomotor sx, can't have ERT.   Sex activity: single partner, She does not have vaginal dryness.  Last Pap: 11/13/18 Results: no abnormalities/ neg HPV DNA; no hx of abn paps, no STDs.  Last mammogram: current per pt, in Epic 12/24/19 at [facility];  Results were: normal--routine follow-up in 12 months. S/p RT breast bx 2015 that was benign. There is no FH of breast cancer. There is no FH of ovarian cancer. The patient does do self-breast exams.  Colonoscopy: 2018  Repeat due after 10 years.   Tobacco use: The patient denies current or previous tobacco use. Alcohol use: social drinker  No drug use Exercise: moderately active  She does get adequate calcium and Vitamin D in her diet.  Labs with PCP. S/p bariatric surgery 2019 with subsequent non-STEMI and PE. On eliquis. Also with B12 deficiency and wears patches.   Past Medical History:  Diagnosis Date   Alopecia    Arthritis    B12 deficiency    Diabetes ([place])    Type 2   Hypertension    Iron deficiency anemia 03/07/2018   Non-STEMI (non-ST elevated myocardial infarction) ([place]) 2020   Pulmonary embolism (HCC)    URI (upper respiratory infection)    took antibiotic and prednisone oct 2019 resolved   Uses contact lenses    Patient Active Problem List   Diagnosis Date Noted   Proteinuria 05/20/2020   Essential (hemorrhagic) thrombocythemia ([place]) 01/07/2020   Snoring 11/20/2019   Syncope, cardiogenic 11/20/2019   Abscess  of lung without pneumonia ([place]) 11/20/2019   Multiple subsegmental pulmonary emboli without acute cor pulmonale ([place]) 11/20/2019   Primary osteoarthritis of left knee 08/26/2019   Pulmonary hypertension, unspecified ([place]) 06/25/2019   H/O bariatric surgery 03/06/2019   History of pulmonary embolism 03/06/2019   Vitamin B12 deficiency 03/06/2019   Thyroid nodule 03/06/2019   Iron deficiency anemia 03/07/2018   Acute renal failure ([place])    Anemia    NSTEMI (non-ST elevated myocardial infarction) ([place]) 02/13/2018   Status post gastrectomy 02/05/2018   Essential hypertension 02/05/2018   Type 2 diabetes mellitus without complication, without long-term current use of insulin ([place]) 02/05/2018   Obesity 01/29/2018   Status post total replacement of right hip 08/04/2017   Unilateral primary osteoarthritis, right knee 07/06/2016   Unilateral primary osteoarthritis, right hip 07/06/2016   Acute pain of right knee 07/06/2016   Pain in right hip 07/06/2016    Past Surgical History:  Procedure Laterality Date   BREAST BIOPSY Right 2015   COLONOSCOPY WITH PROPOFOL N/A 03/18/2016   Procedure: COLONOSCOPY WITH PROPOFOL;  Surgeon: Carol Ada, MD;  Location: [facility];  Service: Endoscopy;  Laterality: N/A;   LAPAROSCOPIC GASTRIC SLEEVE RESECTION N/A 01/29/2018   Procedure: LAPAROSCOPIC GASTRIC SLEEVE RESECTION WITH UPPER ENDO AND ERAS PATHWAY;  Surgeon: Kinsinger, Arta Bruce, MD;  Location: [facility];  Service: General;  Laterality: N/A;   RIGHT/LEFT HEART CATH AND CORONARY  ANGIOGRAPHY N/A 02/15/2018   Procedure: RIGHT/LEFT HEART CATH AND CORONARY ANGIOGRAPHY;  Surgeon: Corey Skains, MD;  Location: [facility];  Service: Cardiovascular;  Laterality: N/A;   TOTAL HIP ARTHROPLASTY Right 08/04/2017   Procedure: RIGHT TOTAL HIP ARTHROPLASTY ANTERIOR APPROACH;  Surgeon: Mcarthur Rossetti, MD;  Location: [facility];  Service: Orthopedics;  Laterality: Right;    Family History  Problem  Relation Age of Onset   Hypertension Mother    Diabetes Mother    Hypertension Father    Diabetes Father    COPD Father    Stroke Father    Congestive Heart Failure Father    Diabetes Sister    Hypertension Sister     Social History   Socioeconomic History   Marital status: Married    Spouse name: Not on file   Number of children: Not on file   Years of education: Not on file   Highest education level: Not on file  Occupational History   Not on file  Tobacco Use   Smoking status: Never   Smokeless tobacco: Never  Vaping Use   Vaping Use: Never used  Substance and Sexual Activity   Alcohol use: Not Currently    Comment: OCCASIONAL    Drug use: No   Sexual activity: Yes    Birth control/protection: None  Other Topics Concern   Not on file  Social History Narrative   Not on file   Social Determinants of Health   Financial Resource Strain: Not on file  Food Insecurity: Not on file  Transportation Needs: Not on file  Physical Activity: Not on file  Stress: Not on file  Social Connections: Not on file  Intimate Partner Violence: Not on file     Current Outpatient Medications:    acetaminophen (TYLENOL) 500 MG tablet, Take 1 tablet (500 mg total) by mouth every 6 (six) hours as needed., Disp: 30 tablet, Rfl: 0   albuterol (PROVENTIL HFA;VENTOLIN HFA) 108 (90 Base) MCG/ACT inhaler, Inhale 1-2 puffs into the lungs every 6 (six) hours as needed for wheezing or shortness of breath., Disp: 1 Inhaler, Rfl: 2   apixaban (ELIQUIS) 2.5 MG TABS tablet, Take 1 tablet (2.5 mg total) by mouth 2 (two) times daily., Disp: 60 tablet, Rfl: 5   atorvastatin (LIPITOR) 20 MG tablet, TAKE 1 TABLET BY MOUTH EVERY DAY, Disp: 90 tablet, Rfl: 3   blood glucose meter kit and supplies KIT, Dispense based on patient and insurance preference. Use up to four times daily as directed. (FOR ICD-9 250.00, 250.01)., Disp: 1 each, Rfl: 0   cetirizine (ZYRTEC) 10 MG tablet, Take 10 mg by mouth 2 (two)  times daily. Pt takes twice a day, Disp: , Rfl:    cyanocobalamin (,VITAMIN B-12,) 1000 MCG/ML injection, Inject into the muscle., Disp: , Rfl:    docusate sodium (COLACE) 100 MG capsule, Take 100 mg by mouth 2 (two) times daily., Disp: , Rfl:    FLUoxetine (PROZAC) 20 MG capsule, TAKE 1 CAPSULE BY MOUTH EVERY DAY, Disp: 90 capsule, Rfl: 1   hydrochlorothiazide (MICROZIDE) 12.5 MG capsule, Take by mouth., Disp: , Rfl:    metFORMIN (GLUCOPHAGE-XR) 500 MG 24 hr tablet, TAKE 1 TABLET BY MOUTH EVERY DAY, Disp: 90 tablet, Rfl: 1   NONFORMULARY OR COMPOUNDED ITEM, Iron patch apply every 8 hours, Disp: , Rfl:    olmesartan (BENICAR) 40 MG tablet, Take 40 mg by mouth daily., Disp: , Rfl:    RYBELSUS 14 MG TABS, TAKE  1 TABLET BY MOUTH EVERY DAY, Disp: 90 tablet, Rfl: 1   Vitamin D, Ergocalciferol, (DRISDOL) 1.25 MG (50000 UNIT) CAPS capsule, TAKE ONE CAPSULE BY MOUTH TWICE WEEKLY ON TUES/FRIDAYS, Disp: 24 capsule, Rfl: 1     ROS:  Review of Systems  Constitutional:  Negative for fatigue, fever and unexpected weight change.  Respiratory:  Negative for cough, shortness of breath and wheezing.   Cardiovascular:  Negative for chest pain, palpitations and leg swelling.  Gastrointestinal:  Negative for blood in stool, constipation, diarrhea, nausea and vomiting.  Endocrine: Negative for cold intolerance, heat intolerance and polyuria.  Genitourinary:  Negative for dyspareunia, dysuria, flank pain, frequency, genital sores, hematuria, menstrual problem, pelvic pain, urgency, vaginal bleeding, vaginal discharge and vaginal pain.  Musculoskeletal:  Negative for arthralgias, back pain, joint swelling and myalgias.  Skin:  Negative for rash.  Neurological:  Negative for dizziness, syncope, light-headedness, numbness and headaches.  Hematological:  Negative for adenopathy.  Psychiatric/Behavioral:  Negative for agitation, confusion, sleep disturbance and suicidal ideas. The patient is not nervous/anxious.   BREAST: No symptoms    Objective: BP 122/80   Ht 5' 4.5" (1.638 m)   Wt 279 lb (126.6 kg)   LMP 01/18/2021 (Approximate)   BMI 47.15 kg/m    Physical Exam Constitutional:      Appearance: She is well-developed.  Genitourinary:     Vulva normal.     Right Labia: No rash, tenderness or lesions.    Left Labia: No tenderness, lesions or rash.    Vaginal bleeding present.     No vaginal discharge, erythema or tenderness.      Right Adnexa: not tender and no mass present.    Left Adnexa: not tender and no mass present.    No cervical friability or polyp.     Uterus is not enlarged or tender.  Breasts:    Right: No mass, nipple discharge, skin change or tenderness.     Left: No mass, nipple discharge, skin change or tenderness.  Neck:     Thyroid: No thyromegaly.  Cardiovascular:     Rate and Rhythm: Normal rate and regular rhythm.     Heart sounds: Normal heart sounds. No murmur heard. Pulmonary:     Effort: Pulmonary effort is normal.     Breath sounds: Normal breath sounds.  Abdominal:     Palpations: Abdomen is soft.     Tenderness: There is no abdominal tenderness. There is no guarding or rebound.  Musculoskeletal:        General: Normal range of motion.     Cervical back: Normal range of motion.  Lymphadenopathy:     Cervical: No cervical adenopathy.  Neurological:     General: No focal deficit present.     Mental Status: She is alert and oriented to person, place, and time.     Cranial Nerves: No cranial nerve deficit.  Skin:    General: Skin is warm and dry.  Psychiatric:        Mood and Affect: Mood normal.        Behavior: Behavior normal.        Thought Content: Thought content normal.        Judgment: Judgment normal.  Vitals reviewed.    Assessment/Plan:  Encounter for annual routine gynecological examination  Encounter for screening mammogram for malignant neoplasm of breast--pt is current at [facility]  Perimenopause--f/u prn AUB.           GYN counsel mammography screening, menopause,  adequate intake of calcium and vitamin D, diet and exercise    F/U  Return in about 1 year (around 01/20/2022).  Ammaar Encina B. Rennee Coyne, PA-C 01/20/2021 4:33 PM

## 2021-01-20 NOTE — Patient Instructions (Signed)
I value your feedback and you entrusting us with your care. If you get a [facility] patient survey, I would appreciate you taking the time to let us know about your experience today. Thank you! ? ? ?

## 2021-01-20 NOTE — Telephone Encounter (Signed)
Pt calling; has appt tomorrow at 4:10; has started cycle; does she need to keep appt or reschedule?  [phone]  Adv to keep appt.

## 2021-01-27 ENCOUNTER — Encounter: Payer: Self-pay | Admitting: Internal Medicine

## 2021-01-27 ENCOUNTER — Ambulatory Visit (INDEPENDENT_AMBULATORY_CARE_PROVIDER_SITE_OTHER): Payer: BC Managed Care – PPO | Admitting: Internal Medicine

## 2021-01-27 ENCOUNTER — Other Ambulatory Visit: Payer: Self-pay

## 2021-01-27 VITALS — BP 132/74 | HR 74 | Temp 98.0°F | Ht 64.5 in | Wt 282.2 lb

## 2021-01-27 DIAGNOSIS — E1169 Type 2 diabetes mellitus with other specified complication: Secondary | ICD-10-CM | POA: Diagnosis not present

## 2021-01-27 DIAGNOSIS — I152 Hypertension secondary to endocrine disorders: Secondary | ICD-10-CM

## 2021-01-27 DIAGNOSIS — Z23 Encounter for immunization: Secondary | ICD-10-CM | POA: Diagnosis not present

## 2021-01-27 DIAGNOSIS — Z6841 Body Mass Index (BMI) 40.0 and over, adult: Secondary | ICD-10-CM

## 2021-01-27 DIAGNOSIS — E1159 Type 2 diabetes mellitus with other circulatory complications: Secondary | ICD-10-CM | POA: Diagnosis not present

## 2021-01-27 NOTE — Patient Instructions (Signed)
Zoster Vaccine, Recombinant injection What is this medication? ZOSTER VACCINE (ZOS ter vak SEEN) is a vaccine used to reduce the risk of getting shingles. This vaccine is not used to treat shingles or nerve pain from shingles. This medicine may be used for other purposes; ask your health care provider or pharmacist if you have questions. COMMON BRAND NAME(S): [facility] What should I tell my care team before I take this medication? They need to know if you have any of these conditions: cancer immune system problems an unusual or allergic reaction to Zoster vaccine, other medications, foods, dyes, or preservatives pregnant or trying to get pregnant breast-feeding How should I use this medication? This vaccine is injected into a muscle. It is given by a health care provider. A copy of Vaccine Information Statements will be given before each vaccination. Be sure to read this information carefully each time. This sheet may change often. Talk to your health care provider about the use of this vaccine in children. This vaccine is not approved for use in children. Overdosage: If you think you have taken too much of this medicine contact a poison control center or emergency room at once. NOTE: This medicine is only for you. Do not share this medicine with others. What if I miss a dose? Keep appointments for follow-up (booster) doses. It is important not to miss your dose. Call your health care provider if you are unable to keep an appointment. What may interact with this medication? medicines that suppress your immune system medicines to treat cancer steroid medicines like prednisone or cortisone This list may not describe all possible interactions. Give your health care provider a list of all the medicines, herbs, non-prescription drugs, or dietary supplements you use. Also tell them if you smoke, drink alcohol, or use illegal drugs. Some items may interact with your medicine. What should I watch for  while using this medication? Visit your health care provider regularly. This vaccine, like all vaccines, may not fully protect everyone. What side effects may I notice from receiving this medication? Side effects that you should report to your doctor or health care professional as soon as possible: allergic reactions (skin rash, itching or hives; swelling of the face, lips, or tongue) trouble breathing Side effects that usually do not require medical attention (report these to your doctor or health care professional if they continue or are bothersome): chills headache fever nausea pain, redness, or irritation at site where injected tiredness vomiting This list may not describe all possible side effects. Call your doctor for medical advice about side effects. You may report side effects to FDA at 1-800-FDA-1088. Where should I keep my medication? This vaccine is only given by a health care provider. It will not be stored at home. NOTE: This sheet is a summary. It may not cover all possible information. If you have questions about this medicine, talk to your doctor, pharmacist, or health care provider.  2022 Elsevier/Gold Standard (2020-10-20 00:00:00)

## 2021-01-27 NOTE — Progress Notes (Signed)
I,Katawbba Wiggins,acting as a Education administrator for Maximino Greenland, MD.,have documented all relevant documentation on the behalf of Maximino Greenland, MD,as directed by  Maximino Greenland, MD while in the presence of Maximino Greenland, MD.  This visit occurred during the SARS-CoV-2 public health emergency.  Safety protocols were in place, including screening questions prior to the visit, additional usage of staff PPE, and extensive cleaning of exam room while observing appropriate contact time as indicated for disinfecting solutions.  Subjective:     Patient ID: Kristen [name] , female    DOB: October 22, 1965 , 55 y.o.   MRN: [id number]   Chief Complaint  Patient presents with   Diabetes   Hypertension    HPI  The patient is here today for a follow-up on her diabetes and blood pressure f/u.  She reports compliance with meds. She denies headaches, chest pain and shortness of breath.   Diabetes She presents for her follow-up diabetic visit. She has type 2 diabetes mellitus. There are no hypoglycemic associated symptoms. Pertinent negatives for hypoglycemia include no dizziness or headaches. Pertinent negatives for diabetes include no blurred vision and no chest pain. There are no hypoglycemic complications. Symptoms are stable. There are no diabetic complications. Risk factors for coronary artery disease include obesity and sedentary lifestyle. She is compliant with treatment all of the time.  Hypertension This is a chronic problem. The current episode started more than 1 year ago. The problem has been gradually improving since onset. The problem is controlled. Pertinent negatives include no blurred vision, chest pain, headaches, orthopnea, palpitations or shortness of breath. Risk factors for coronary artery disease include diabetes mellitus, dyslipidemia, obesity and sedentary lifestyle. Past treatments include diuretics and angiotensin blockers.    Past Medical History:  Diagnosis Date   Alopecia     Arthritis    B12 deficiency    Diabetes ([place])    Type 2   Hypertension    Iron deficiency anemia 03/07/2018   Non-STEMI (non-ST elevated myocardial infarction) ([place]) 2020   Pulmonary embolism (HCC)    URI (upper respiratory infection)    took antibiotic and prednisone oct 2019 resolved   Uses contact lenses      Family History  Problem Relation Age of Onset   Hypertension Mother    Diabetes Mother    Hypertension Father    Diabetes Father    COPD Father    Stroke Father    Congestive Heart Failure Father    Diabetes Sister    Hypertension Sister      Current Outpatient Medications:    acetaminophen (TYLENOL) 500 MG tablet, Take 1 tablet (500 mg total) by mouth every 6 (six) hours as needed., Disp: 30 tablet, Rfl: 0   albuterol (PROVENTIL HFA;VENTOLIN HFA) 108 (90 Base) MCG/ACT inhaler, Inhale 1-2 puffs into the lungs every 6 (six) hours as needed for wheezing or shortness of breath., Disp: 1 Inhaler, Rfl: 2   apixaban (ELIQUIS) 2.5 MG TABS tablet, Take 1 tablet (2.5 mg total) by mouth 2 (two) times daily., Disp: 60 tablet, Rfl: 5   atorvastatin (LIPITOR) 20 MG tablet, TAKE 1 TABLET BY MOUTH EVERY DAY, Disp: 90 tablet, Rfl: 3   blood glucose meter kit and supplies KIT, Dispense based on patient and insurance preference. Use up to four times daily as directed. (FOR ICD-9 250.00, 250.01)., Disp: 1 each, Rfl: 0   cetirizine (ZYRTEC) 10 MG tablet, Take 10 mg by mouth 2 (two) times daily. Pt takes twice a day,  Disp: , Rfl:    cyanocobalamin (,VITAMIN B-12,) 1000 MCG/ML injection, Inject into the muscle., Disp: , Rfl:    docusate sodium (COLACE) 100 MG capsule, Take 100 mg by mouth 2 (two) times daily., Disp: , Rfl:    FLUoxetine (PROZAC) 20 MG capsule, TAKE 1 CAPSULE BY MOUTH EVERY DAY, Disp: 90 capsule, Rfl: 1   hydrochlorothiazide (MICROZIDE) 12.5 MG capsule, Take by mouth., Disp: , Rfl:    metFORMIN (GLUCOPHAGE-XR) 500 MG 24 hr tablet, TAKE 1 TABLET BY MOUTH EVERY DAY, Disp: 90  tablet, Rfl: 1   NONFORMULARY OR COMPOUNDED ITEM, Iron patch apply every 8 hours, Disp: , Rfl:    olmesartan (BENICAR) 40 MG tablet, Take 40 mg by mouth daily., Disp: , Rfl:    RYBELSUS 14 MG TABS, TAKE 1 TABLET BY MOUTH EVERY DAY, Disp: 90 tablet, Rfl: 1   Vitamin D, Ergocalciferol, (DRISDOL) 1.25 MG (50000 UNIT) CAPS capsule, TAKE ONE CAPSULE BY MOUTH TWICE WEEKLY ON TUES/FRIDAYS, Disp: 24 capsule, Rfl: 1   Allergies  Allergen Reactions   Amoxicillin Hives    Has patient had a PCN reaction causing immediate rash, facial/tongue/throat swelling, SOB or lightheadedness with hypotension: No Has patient had a PCN reaction causing severe rash involving mucus membranes or skin necrosis: Yes Has patient had a PCN reaction that required hospitalization No Has patient had a PCN reaction occurring within the last 10 years: No If all of the above answers are "NO", then may proceed with Cephalosporin use.    Fish Allergy Hives     Review of Systems  Constitutional: Negative.   Eyes:  Negative for blurred vision.  Respiratory: Negative.  Negative for shortness of breath.   Cardiovascular: Negative.  Negative for chest pain, palpitations and orthopnea.  Gastrointestinal: Negative.   Neurological:  Negative for dizziness and headaches.  Psychiatric/Behavioral: Negative.    All other systems reviewed and are negative.   Today's Vitals   01/27/21 1605  BP: 132/74  Pulse: 74  Temp: 98 F (36.7 C)  Weight: 282 lb 3.2 oz (128 kg)  Height: 5' 4.5" (1.638 m)   Body mass index is 47.69 kg/m.  Wt Readings from Last 3 Encounters:  01/27/21 282 lb 3.2 oz (128 kg)  01/20/21 279 lb (126.6 kg)  10/26/20 282 lb 3.2 oz (128 kg)    BP Readings from Last 3 Encounters:  01/27/21 132/74  01/20/21 122/80  10/26/20 130/82    Objective:  Physical Exam Vitals and nursing note reviewed.  Constitutional:      Appearance: Normal appearance. She is obese.  HENT:     Head: Normocephalic and atraumatic.      Nose:     Comments: Masked    Mouth/Throat:     Comments: Masked  Eyes:     Extraocular Movements: Extraocular movements intact.  Cardiovascular:     Rate and Rhythm: Normal rate and regular rhythm.     Heart sounds: Normal heart sounds.  Pulmonary:     Effort: Pulmonary effort is normal.     Breath sounds: Normal breath sounds.  Musculoskeletal:     Cervical back: Normal range of motion.  Skin:    General: Skin is warm.  Neurological:     General: No focal deficit present.     Mental Status: She is alert.  Psychiatric:        Mood and Affect: Mood normal.        Behavior: Behavior normal.        Assessment And  Plan:     1. Obesity, diabetes, and hypertension syndrome ([place]) Comments: Chronic, I will check labs as listed below. BP is under fair control. Goal BP <130/80. Encouraged to follow low sodium diet and to incorporate more exercise into her daily routine.  Advised to aim for at least 150 minutes of exercise per week. She will f/u in four months.  - BMP8+EGFR - Hemoglobin A1c  2. Class 3 severe obesity due to excess calories with serious comorbidity and body mass index (BMI) of 40.0 to 44.9 in adult [facility]) Comments: BMI 42. She is encouraged to strive for BMI less than 30 to decrease cardiac risk. Advised to aim for at least 150 minutes of exercise per week.   3. Encounter for immunization Comments: She was given Shingrix IM x 1.  - Varicella-zoster vaccine IM (Shingrix)   Patient was given opportunity to ask questions. Patient verbalized understanding of the plan and was able to repeat key elements of the plan. All questions were answered to their satisfaction.   I, Maximino Greenland, MD, have reviewed all documentation for this visit. The documentation on 01/27/21 for the exam, diagnosis, procedures, and orders are all accurate and complete.   IF YOU HAVE BEEN REFERRED TO A SPECIALIST, IT MAY TAKE 1-2 WEEKS TO SCHEDULE/PROCESS THE REFERRAL. IF YOU HAVE NOT HEARD  FROM US/SPECIALIST IN TWO WEEKS, PLEASE GIVE [place] A CALL AT [phone].   THE PATIENT IS ENCOURAGED TO PRACTICE SOCIAL DISTANCING DUE TO THE COVID-19 PANDEMIC.

## 2021-01-28 LAB — BMP8+EGFR
BUN/Creatinine Ratio: 18 (ref 9–23)
BUN: 21 mg/dL (ref 6–24)
CO2: 21 mmol/L (ref 20–29)
Calcium: 9.3 mg/dL (ref 8.7–10.2)
Chloride: 104 mmol/L (ref 96–106)
Creatinine, Ser: 1.15 mg/dL — ABNORMAL HIGH (ref 0.57–1.00)
Glucose: 87 mg/dL (ref 70–99)
Potassium: 4.8 mmol/L (ref 3.5–5.2)
Sodium: 140 mmol/L (ref 134–144)
eGFR: 56 mL/min/{1.73_m2} — ABNORMAL LOW (ref >59)

## 2021-01-28 LAB — HEMOGLOBIN A1C
Est. average glucose Bld gHb Est-mCnc: 120 mg/dL
Hgb A1c MFr Bld: 5.8 % — ABNORMAL HIGH (ref 4.8–5.6)

## 2021-02-01 ENCOUNTER — Encounter: Payer: Self-pay | Admitting: Internal Medicine

## 2021-02-03 ENCOUNTER — Encounter: Payer: Self-pay | Admitting: Oncology

## 2021-02-03 ENCOUNTER — Ambulatory Visit
Admission: RE | Admit: 2021-02-03 | Discharge: 2021-02-03 | Disposition: A | Discharge status: Home / Self Care | Payer: BC Managed Care – PPO | Source: Ambulatory Visit | Attending: Obstetrics and Gynecology | Admitting: Obstetrics and Gynecology

## 2021-02-03 DIAGNOSIS — Z1231 Encounter for screening mammogram for malignant neoplasm of breast: Secondary | ICD-10-CM

## 2021-02-10 ENCOUNTER — Inpatient Hospital Stay: Payer: BC Managed Care – PPO | Attending: Oncology

## 2021-02-10 ENCOUNTER — Other Ambulatory Visit: Payer: Self-pay

## 2021-02-10 DIAGNOSIS — D500 Iron deficiency anemia secondary to blood loss (chronic): Secondary | ICD-10-CM

## 2021-02-10 DIAGNOSIS — E538 Deficiency of other specified B group vitamins: Secondary | ICD-10-CM | POA: Diagnosis not present

## 2021-02-10 DIAGNOSIS — D509 Iron deficiency anemia, unspecified: Secondary | ICD-10-CM | POA: Diagnosis not present

## 2021-02-10 MED ORDER — CYANOCOBALAMIN 1000 MCG/ML IJ SOLN
1000.0000 ug | Freq: Once | INTRAMUSCULAR | Status: AC
  Administered 2021-02-10: 13:00:00 1000 ug via INTRAMUSCULAR
  Filled 2021-02-10: qty 1

## 2021-02-13 ENCOUNTER — Other Ambulatory Visit: Payer: Self-pay | Admitting: Internal Medicine

## 2021-03-07 ENCOUNTER — Other Ambulatory Visit: Payer: Self-pay | Admitting: Internal Medicine

## 2021-03-09 ENCOUNTER — Other Ambulatory Visit: Payer: Self-pay | Admitting: *Deleted

## 2021-03-09 DIAGNOSIS — D500 Iron deficiency anemia secondary to blood loss (chronic): Secondary | ICD-10-CM

## 2021-03-15 ENCOUNTER — Inpatient Hospital Stay: Payer: BC Managed Care – PPO | Attending: Oncology

## 2021-03-15 ENCOUNTER — Other Ambulatory Visit: Payer: Self-pay

## 2021-03-15 DIAGNOSIS — Z9884 Bariatric surgery status: Secondary | ICD-10-CM | POA: Diagnosis not present

## 2021-03-15 DIAGNOSIS — D500 Iron deficiency anemia secondary to blood loss (chronic): Secondary | ICD-10-CM

## 2021-03-15 DIAGNOSIS — D508 Other iron deficiency anemias: Secondary | ICD-10-CM | POA: Diagnosis present

## 2021-03-15 DIAGNOSIS — Z86711 Personal history of pulmonary embolism: Secondary | ICD-10-CM | POA: Insufficient documentation

## 2021-03-15 DIAGNOSIS — E538 Deficiency of other specified B group vitamins: Secondary | ICD-10-CM | POA: Insufficient documentation

## 2021-03-15 LAB — COMPREHENSIVE METABOLIC PANEL
ALT: 13 U/L (ref 0–44)
AST: 23 U/L (ref 15–41)
Albumin: 4 g/dL (ref 3.5–5.0)
Alkaline Phosphatase: 56 U/L (ref 38–126)
Anion gap: 7 (ref 5–15)
BUN: 25 mg/dL — ABNORMAL HIGH (ref 6–20)
CO2: 27 mmol/L (ref 22–32)
Calcium: 9.2 mg/dL (ref 8.9–10.3)
Chloride: 105 mmol/L (ref 98–111)
Creatinine, Ser: 1.17 mg/dL — ABNORMAL HIGH (ref 0.44–1.00)
GFR, Estimated: 55 mL/min — ABNORMAL LOW (ref >60)
Glucose, Bld: 107 mg/dL — ABNORMAL HIGH (ref 70–99)
Potassium: 4.2 mmol/L (ref 3.5–5.1)
Sodium: 139 mmol/L (ref 135–145)
Total Bilirubin: 0.3 mg/dL (ref 0.3–1.2)
Total Protein: 8.3 g/dL — ABNORMAL HIGH (ref 6.5–8.1)

## 2021-03-15 LAB — CBC WITH DIFFERENTIAL/PLATELET
Abs Immature Granulocytes: 0.01 10*3/uL (ref 0.00–0.07)
Basophils Absolute: 0 10*3/uL (ref 0.0–0.1)
Basophils Relative: 0 %
Eosinophils Absolute: 0.1 10*3/uL (ref 0.0–0.5)
Eosinophils Relative: 1 %
HCT: 37.8 % (ref 36.0–46.0)
Hemoglobin: 11.7 g/dL — ABNORMAL LOW (ref 12.0–15.0)
Immature Granulocytes: 0 %
Lymphocytes Relative: 46 %
Lymphs Abs: 2.4 10*3/uL (ref 0.7–4.0)
MCH: 29 pg (ref 26.0–34.0)
MCHC: 31 g/dL (ref 30.0–36.0)
MCV: 93.6 fL (ref 80.0–100.0)
Monocytes Absolute: 0.4 10*3/uL (ref 0.1–1.0)
Monocytes Relative: 8 %
Neutro Abs: 2.4 10*3/uL (ref 1.7–7.7)
Neutrophils Relative %: 45 %
Platelets: 328 10*3/uL (ref 150–400)
RBC: 4.04 MIL/uL (ref 3.87–5.11)
RDW: 13.6 % (ref 11.5–15.5)
WBC: 5.3 10*3/uL (ref 4.0–10.5)
nRBC: 0 % (ref 0.0–0.2)

## 2021-03-15 LAB — VITAMIN B12: Vitamin B-12: 455 pg/mL (ref 180–914)

## 2021-03-15 LAB — FERRITIN: Ferritin: 82 ng/mL (ref 11–307)

## 2021-03-15 LAB — IRON AND TIBC
Iron: 85 ug/dL (ref 28–170)
Saturation Ratios: 25 % (ref 10.4–31.8)
TIBC: 336 ug/dL (ref 250–450)
UIBC: 251 ug/dL

## 2021-03-17 ENCOUNTER — Inpatient Hospital Stay: Payer: BC Managed Care – PPO

## 2021-03-17 ENCOUNTER — Inpatient Hospital Stay: Payer: BC Managed Care – PPO | Attending: Oncology | Admitting: Oncology

## 2021-03-17 ENCOUNTER — Other Ambulatory Visit: Payer: Self-pay

## 2021-03-17 ENCOUNTER — Encounter: Payer: Self-pay | Admitting: Oncology

## 2021-03-17 VITALS — BP 94/76 | HR 76 | Temp 97.6°F | Resp 18 | Wt 276.3 lb

## 2021-03-17 DIAGNOSIS — Z86711 Personal history of pulmonary embolism: Secondary | ICD-10-CM

## 2021-03-17 DIAGNOSIS — D500 Iron deficiency anemia secondary to blood loss (chronic): Secondary | ICD-10-CM | POA: Diagnosis not present

## 2021-03-17 DIAGNOSIS — Z7901 Long term (current) use of anticoagulants: Secondary | ICD-10-CM | POA: Insufficient documentation

## 2021-03-17 DIAGNOSIS — I2699 Other pulmonary embolism without acute cor pulmonale: Secondary | ICD-10-CM | POA: Insufficient documentation

## 2021-03-17 DIAGNOSIS — I2510 Atherosclerotic heart disease of native coronary artery without angina pectoris: Secondary | ICD-10-CM | POA: Diagnosis not present

## 2021-03-17 DIAGNOSIS — E119 Type 2 diabetes mellitus without complications: Secondary | ICD-10-CM | POA: Insufficient documentation

## 2021-03-17 DIAGNOSIS — Z880 Allergy status to penicillin: Secondary | ICD-10-CM | POA: Insufficient documentation

## 2021-03-17 DIAGNOSIS — Z823 Family history of stroke: Secondary | ICD-10-CM | POA: Diagnosis not present

## 2021-03-17 DIAGNOSIS — R55 Syncope and collapse: Secondary | ICD-10-CM | POA: Diagnosis not present

## 2021-03-17 DIAGNOSIS — I10 Essential (primary) hypertension: Secondary | ICD-10-CM | POA: Diagnosis not present

## 2021-03-17 DIAGNOSIS — E041 Nontoxic single thyroid nodule: Secondary | ICD-10-CM | POA: Diagnosis not present

## 2021-03-17 DIAGNOSIS — I272 Pulmonary hypertension, unspecified: Secondary | ICD-10-CM | POA: Diagnosis not present

## 2021-03-17 DIAGNOSIS — Z836 Family history of other diseases of the respiratory system: Secondary | ICD-10-CM | POA: Diagnosis not present

## 2021-03-17 DIAGNOSIS — I252 Old myocardial infarction: Secondary | ICD-10-CM | POA: Insufficient documentation

## 2021-03-17 DIAGNOSIS — R42 Dizziness and giddiness: Secondary | ICD-10-CM | POA: Insufficient documentation

## 2021-03-17 DIAGNOSIS — E538 Deficiency of other specified B group vitamins: Secondary | ICD-10-CM | POA: Diagnosis not present

## 2021-03-17 DIAGNOSIS — Z79899 Other long term (current) drug therapy: Secondary | ICD-10-CM | POA: Diagnosis not present

## 2021-03-17 DIAGNOSIS — Z8249 Family history of ischemic heart disease and other diseases of the circulatory system: Secondary | ICD-10-CM | POA: Insufficient documentation

## 2021-03-17 DIAGNOSIS — R778 Other specified abnormalities of plasma proteins: Secondary | ICD-10-CM | POA: Insufficient documentation

## 2021-03-17 DIAGNOSIS — D508 Other iron deficiency anemias: Secondary | ICD-10-CM | POA: Insufficient documentation

## 2021-03-17 DIAGNOSIS — Z833 Family history of diabetes mellitus: Secondary | ICD-10-CM | POA: Insufficient documentation

## 2021-03-17 DIAGNOSIS — Z9884 Bariatric surgery status: Secondary | ICD-10-CM

## 2021-03-17 MED ORDER — CYANOCOBALAMIN 1000 MCG/ML IJ SOLN
1000.0000 ug | Freq: Once | INTRAMUSCULAR | Status: AC
  Administered 2021-03-17: 1000 ug via INTRAMUSCULAR
  Filled 2021-03-17: qty 1

## 2021-03-17 MED ORDER — APIXABAN 2.5 MG PO TABS: 2.5000 mg | ORAL_TABLET | Freq: Two times a day (BID) | ORAL | 5 refills | Status: DC

## 2021-03-17 NOTE — Progress Notes (Signed)
Pt here for follow up. No new concerns voiced.   

## 2021-03-17 NOTE — Progress Notes (Signed)
Hematology/Oncology Progress note [phone] [phone]     Patient Care Team: Glendale Chard, MD as PCP - General (Internal Medicine) Earlie Server, MD as Consulting Physician (Oncology)   Name of the patient: Kristen [name]  [id number]  03-Sep-1965   REASON FOR VISIT  follow-up for management of pulmonary embolism  PERTINENT HEMATOLOGY HISTORY Kristen [name] is a 56 y.o.afemale who has above oncology history reviewed by me today presented for follow up visit for management of pulmonary embolism.  Patient was seen by me during hospitalization.. Patient presents to ER for evaluation of syncope episode. Patient fainted but did not fall or hurt herself as family member caught patient.  EKG in ED showed ST changed. Troponin elevated and started on heparin drip and admitted.  On day 1 of admission, CODE BLUE was called and patient was unresponsive and received brief chest compression. Patient woke up and talked.  No cardiac arrest.  She reports feeling dizzy and lightheaded and passed out.  Similar to the syncope episode prior to admission. Cardiac catheterization showed normal LV systolic function and hyperdynamic in nature with ejection fraction of 70% consistent with hyper trophic cardiomyopathy.  Significantly elevated pulmonary pressure possibly consistent with pulmonary embolism.  No cardiac intervention due to normal coronary arteries. Patient had VQ scan on 02/15/2018 which showed hypermobility for pulmonary embolism Patient was on heparin drip and was transitioned to Eliquis 10 mg twice daily started on 02/16/2018.  # PE finished 6 months of Eliquis 63m BID.  # History of gastric sleeve  INTERVAL HISTORY Kristen D WTroutmanis a 56y.o. y.o.female who has above oncology history reviewed by me today presented for follow up visit for management of pulmonary embolism, iron deficiency, vitamin B12 deficiency.  Patient reports feeling well.  She takes Eliquis  2.5 mg twice daily.  Tolerates well.  No bleeding events Patient has no new complaints.  She mentioned that recently had a menstrual period . Review of Systems  Constitutional:  Negative for appetite change, chills, fatigue and fever.  HENT:   Negative for hearing loss and voice change.   Eyes:  Negative for eye problems.  Respiratory:  Negative for chest tightness, cough and shortness of breath.   Cardiovascular:  Negative for chest pain.  Gastrointestinal:  Negative for abdominal distention, abdominal pain and blood in stool.  Endocrine: Negative for hot flashes.  Genitourinary:  Negative for difficulty urinating and frequency.   Musculoskeletal:  Negative for arthralgias.  Skin:  Negative for itching and rash.  Neurological:  Negative for extremity weakness.  Hematological:  Negative for adenopathy.  Psychiatric/Behavioral:  Negative for confusion.      Allergies  Allergen Reactions   Amoxicillin Hives    Has patient had a PCN reaction causing immediate rash, facial/tongue/throat swelling, SOB or lightheadedness with hypotension: No Has patient had a PCN reaction causing severe rash involving mucus membranes or skin necrosis: Yes Has patient had a PCN reaction that required hospitalization No Has patient had a PCN reaction occurring within the last 10 years: No If all of the above answers are "NO", then may proceed with Cephalosporin use.    Fish Allergy Hives     Past Medical History:  Diagnosis Date   Alopecia    Arthritis    B12 deficiency    [place]    Type 2   Hypertension    Iron deficiency anemia 03/07/2018   Non-STEMI (non-ST elevated myocardial infarction) [place]   Pulmonary embolism ([place]  URI (upper respiratory infection)    took antibiotic and prednisone oct 2019 resolved   Uses contact lenses      Past Surgical History:  Procedure Laterality Date   BREAST BIOPSY Right 2015   COLONOSCOPY WITH PROPOFOL N/A 03/18/2016   Procedure:  COLONOSCOPY WITH PROPOFOL;  Surgeon: Carol Ada, MD;  Location: [facility];  Service: Endoscopy;  Laterality: N/A;   LAPAROSCOPIC GASTRIC SLEEVE RESECTION N/A 01/29/2018   Procedure: LAPAROSCOPIC GASTRIC SLEEVE RESECTION WITH UPPER ENDO AND ERAS PATHWAY;  Surgeon: Kinsinger, Arta Bruce, MD;  Location: [facility];  Service: General;  Laterality: N/A;   RIGHT/LEFT HEART CATH AND CORONARY ANGIOGRAPHY N/A 02/15/2018   Procedure: RIGHT/LEFT HEART CATH AND CORONARY ANGIOGRAPHY;  Surgeon: Corey Skains, MD;  Location: [facility];  Service: Cardiovascular;  Laterality: N/A;   TOTAL HIP ARTHROPLASTY Right 08/04/2017   Procedure: RIGHT TOTAL HIP ARTHROPLASTY ANTERIOR APPROACH;  Surgeon: Mcarthur Rossetti, MD;  Location: [facility];  Service: Orthopedics;  Laterality: Right;    Social History   Socioeconomic History   Marital status: Married    Spouse name: Not on file   Number of children: Not on file   Years of education: Not on file   Highest education level: Not on file  Occupational History   Not on file  Tobacco Use   Smoking status: Never   Smokeless tobacco: Never  Vaping Use   Vaping Use: Never used  Substance and Sexual Activity   Alcohol use: Not Currently    Comment: OCCASIONAL    Drug use: No   Sexual activity: Yes    Birth control/protection: None  Other Topics Concern   Not on file  Social History Narrative   Not on file   Social Determinants of Health   Financial Resource Strain: Not on file  Food Insecurity: Not on file  Transportation Needs: Not on file  Physical Activity: Not on file  Stress: Not on file  Social Connections: Not on file  Intimate Partner Violence: Not on file    Family History  Problem Relation Age of Onset   Hypertension Mother    Diabetes Mother    Hypertension Father    Diabetes Father    COPD Father    Stroke Father    Congestive Heart Failure Father    Diabetes Sister    Hypertension Sister      Current Outpatient  Medications:    atorvastatin (LIPITOR) 20 MG tablet, TAKE 1 TABLET BY MOUTH EVERY DAY, Disp: 90 tablet, Rfl: 3   blood glucose meter kit and supplies KIT, Dispense based on patient and insurance preference. Use up to four times daily as directed. (FOR ICD-9 250.00, 250.01)., Disp: 1 each, Rfl: 0   cetirizine (ZYRTEC) 10 MG tablet, Take 10 mg by mouth 2 (two) times daily. Pt takes twice a day, Disp: , Rfl:    cyanocobalamin (,VITAMIN B-12,) 1000 MCG/ML injection, Inject into the muscle., Disp: , Rfl:    docusate sodium (COLACE) 100 MG capsule, Take 100 mg by mouth 2 (two) times daily., Disp: , Rfl:    FLUoxetine (PROZAC) 20 MG capsule, TAKE 1 CAPSULE BY MOUTH EVERY DAY, Disp: 90 capsule, Rfl: 1   hydrochlorothiazide (MICROZIDE) 12.5 MG capsule, Take by mouth., Disp: , Rfl:    metFORMIN (GLUCOPHAGE-XR) 500 MG 24 hr tablet, TAKE 1 TABLET BY MOUTH EVERY DAY, Disp: 90 tablet, Rfl: 1   NONFORMULARY OR COMPOUNDED ITEM, Iron patch apply every 8 hours, Disp: , Rfl:  olmesartan (BENICAR) 40 MG tablet, Take 40 mg by mouth daily., Disp: , Rfl:    RYBELSUS 14 MG TABS, TAKE 1 TABLET BY MOUTH EVERY DAY, Disp: 90 tablet, Rfl: 1   Vitamin D, Ergocalciferol, (DRISDOL) 1.25 MG (50000 UNIT) CAPS capsule, TAKE ONE CAPSULE BY MOUTH TWICE WEEKLY ON TUES/FRIDAYS, Disp: 24 capsule, Rfl: 1   acetaminophen (TYLENOL) 500 MG tablet, Take 1 tablet (500 mg total) by mouth every 6 (six) hours as needed. (Patient not taking: Reported on 03/17/2021), Disp: 30 tablet, Rfl: 0   albuterol (PROVENTIL HFA;VENTOLIN HFA) 108 (90 Base) MCG/ACT inhaler, Inhale 1-2 puffs into the lungs every 6 (six) hours as needed for wheezing or shortness of breath. (Patient not taking: Reported on 03/17/2021), Disp: 1 Inhaler, Rfl: 2   apixaban (ELIQUIS) 2.5 MG TABS tablet, Take 1 tablet (2.5 mg total) by mouth 2 (two) times daily., Disp: 60 tablet, Rfl: 5  Physical exam:  Vitals:   03/17/21 1320  BP: 94/76  Pulse: 76  Resp: 18  Temp: 97.6 F (36.4 C)   Weight: 276 lb 4.8 oz (125.3 kg)   Physical Exam Constitutional:      General: She is not in acute distress.    Comments: Morbidly obese.   HENT:     Head: Normocephalic and atraumatic.     Comments: Chronic alopecia Eyes:     General: No scleral icterus.    Pupils: Pupils are equal, round, and reactive to light.  Cardiovascular:     Rate and Rhythm: Normal rate and regular rhythm.     Heart sounds: Normal heart sounds.  Pulmonary:     Effort: Pulmonary effort is normal. No respiratory distress.     Breath sounds: No wheezing.  Abdominal:     General: Bowel sounds are normal. There is no distension.     Palpations: Abdomen is soft. There is no mass.     Tenderness: There is no abdominal tenderness.  Musculoskeletal:        General: No deformity. Normal range of motion.     Cervical back: Normal range of motion and neck supple.  Skin:    General: Skin is warm and dry.     Findings: No erythema or rash.  Neurological:     Mental Status: She is alert and oriented to person, place, and time.     Cranial Nerves: No cranial nerve deficit.     Coordination: Coordination normal.  Psychiatric:        Behavior: Behavior normal.        Thought Content: Thought content normal.    CMP Latest Ref Rng & Units 03/15/2021  Glucose 70 - 99 mg/dL 107(H)  BUN 6 - 20 mg/dL 25(H)  Creatinine 0.44 - 1.00 mg/dL 1.17(H)  Sodium 135 - 145 mmol/L 139  Potassium 3.5 - 5.1 mmol/L 4.2  Chloride 98 - 111 mmol/L 105  CO2 22 - 32 mmol/L 27  Calcium 8.9 - 10.3 mg/dL 9.2  Total Protein 6.5 - 8.1 g/dL 8.3(H)  Total Bilirubin 0.3 - 1.2 mg/dL 0.3  Alkaline Phos 38 - 126 U/L 56  AST 15 - 41 U/L 23  ALT 0 - 44 U/L 13   CBC Latest Ref Rng & Units 03/15/2021  WBC 4.0 - 10.5 K/uL 5.3  Hemoglobin 12.0 - 15.0 g/dL 11.7(L)  Hematocrit 36.0 - 46.0 % 37.8  Platelets 150 - 400 K/uL 328   RADIOGRAPHIC STUDIES: I have personally reviewed the radiological images as listed and agreed with the findings  in the  report. No results found.   Assessment and plan Patient is a 56 y.o. female female with history of morbid obesity status post laparoscopic sleeve gastrectomy on 01/29/2018, diabetes, hypertension, presented to the emergency room for evaluation of syncope episodes.  Status post cardiac cath which showed increased pulmonary hypertension.  Normal coronary artery disease.  VQ scan showed high probability of PE.  1. Iron deficiency anemia due to chronic blood loss   2. Vitamin B12 deficiency   3. H/O bariatric surgery   4. History of pulmonary embolism     #History of pulmonary embolism, continue Eliquis 2.93m BID.  Review 639-monthupply  # Iron deficiency anemia, in the context of gastric sleeve Labs reviewed and discussed with patient.  Hemoglobin slightly low at 11.7, ferritin 82, iron saturation 25. Will hold off IV Venofer at this point.  Continue monitor.  #Vitamin B12 deficiency.  Continue monthly parenteral vitamin b12 100041minjections.  #History of bariatric surgery/gastric sleeve.  At the risk of vitamin deficiency. Monitor.  #Thyroid nodule, patient follows up with ENT and Dr. GinWynonia Hazards ordered an ultrasound of the thyroid in April 2023.  We spent sufficient time to discuss many aspect of care, questions were answered to patient's satisfaction.  Orders Placed This Encounter  Procedures   CBC with Differential/Platelet    Standing Status:   Future    Standing Expiration Date:   03/17/2022   Comprehensive metabolic panel    Standing Status:   Future    Standing Expiration Date:   03/17/2022   Ferritin    Standing Status:   Future    Standing Expiration Date:   03/17/2022   Iron and TIBC    Standing Status:   Future    Standing Expiration Date:   03/17/2022   Vitamin B12    Standing Status:   Future    Standing Expiration Date:   03/17/2022     RTC in 6 months.     ZhoEarlie ServerD, PhD Hematology Oncology  03/17/2021

## 2021-04-07 ENCOUNTER — Encounter: Payer: Self-pay | Admitting: Internal Medicine

## 2021-04-07 ENCOUNTER — Telehealth: Payer: Self-pay | Admitting: *Deleted

## 2021-04-07 NOTE — Telephone Encounter (Signed)
Patient called stating that she needs dental work done and the dentist Dr Alfonse Flavors at [facility] wants her to get clearance due to previous MI and PE's problems she has had. She is asking if we would provide this clearance for her. Please advise

## 2021-04-09 ENCOUNTER — Encounter: Payer: Self-pay | Admitting: Oncology

## 2021-04-09 NOTE — Telephone Encounter (Signed)
Patient informed per Dr Tasia Catchings to hold Eliquis 2 days prior to procedure and restart after the procedure is done and also that she needs to get other clearance from her PCP. She repeated this to me and thanked me for calling her back

## 2021-04-14 ENCOUNTER — Inpatient Hospital Stay: Payer: BC Managed Care – PPO | Attending: Oncology

## 2021-04-14 ENCOUNTER — Other Ambulatory Visit: Payer: Self-pay

## 2021-04-14 DIAGNOSIS — D509 Iron deficiency anemia, unspecified: Secondary | ICD-10-CM | POA: Insufficient documentation

## 2021-04-14 DIAGNOSIS — D500 Iron deficiency anemia secondary to blood loss (chronic): Secondary | ICD-10-CM

## 2021-04-14 DIAGNOSIS — E538 Deficiency of other specified B group vitamins: Secondary | ICD-10-CM | POA: Diagnosis present

## 2021-04-14 DIAGNOSIS — Z79899 Other long term (current) drug therapy: Secondary | ICD-10-CM | POA: Insufficient documentation

## 2021-04-14 MED ORDER — CYANOCOBALAMIN 1000 MCG/ML IJ SOLN
1000.0000 ug | Freq: Once | INTRAMUSCULAR | Status: AC
  Administered 2021-04-14: 1000 ug via INTRAMUSCULAR
  Filled 2021-04-14: qty 1

## 2021-04-16 ENCOUNTER — Other Ambulatory Visit: Payer: Self-pay | Admitting: Internal Medicine

## 2021-04-16 NOTE — Telephone Encounter (Signed)
Refill Metformin 500MG   ?

## 2021-05-05 ENCOUNTER — Encounter: Payer: Self-pay | Admitting: Oncology

## 2021-05-05 NOTE — Telephone Encounter (Signed)
Please advise 

## 2021-05-08 ENCOUNTER — Encounter: Payer: Self-pay | Admitting: Internal Medicine

## 2021-05-08 ENCOUNTER — Other Ambulatory Visit: Payer: Self-pay

## 2021-05-08 MED ORDER — FLUOXETINE HCL 20 MG PO CAPS: ORAL_CAPSULE | ORAL | 1 refills | Status: DC

## 2021-05-19 ENCOUNTER — Inpatient Hospital Stay: Payer: BC Managed Care – PPO | Attending: Oncology

## 2021-05-19 DIAGNOSIS — D500 Iron deficiency anemia secondary to blood loss (chronic): Secondary | ICD-10-CM

## 2021-05-19 DIAGNOSIS — Z86711 Personal history of pulmonary embolism: Secondary | ICD-10-CM | POA: Insufficient documentation

## 2021-05-19 DIAGNOSIS — Z79899 Other long term (current) drug therapy: Secondary | ICD-10-CM | POA: Insufficient documentation

## 2021-05-19 DIAGNOSIS — E538 Deficiency of other specified B group vitamins: Secondary | ICD-10-CM | POA: Diagnosis present

## 2021-05-19 DIAGNOSIS — Z9884 Bariatric surgery status: Secondary | ICD-10-CM | POA: Insufficient documentation

## 2021-05-19 DIAGNOSIS — D509 Iron deficiency anemia, unspecified: Secondary | ICD-10-CM | POA: Insufficient documentation

## 2021-05-19 MED ORDER — CYANOCOBALAMIN 1000 MCG/ML IJ SOLN
1000.0000 ug | Freq: Once | INTRAMUSCULAR | Status: AC
  Administered 2021-05-19: 1000 ug via INTRAMUSCULAR
  Filled 2021-05-19: qty 1

## 2021-05-24 ENCOUNTER — Ambulatory Visit: Payer: BC Managed Care – PPO

## 2021-05-24 ENCOUNTER — Ambulatory Visit
Admission: RE | Admit: 2021-05-24 | Discharge: 2021-05-24 | Disposition: A | Discharge status: Home / Self Care | Payer: BC Managed Care – PPO | Source: Ambulatory Visit | Attending: Otolaryngology | Admitting: Otolaryngology

## 2021-05-24 DIAGNOSIS — E041 Nontoxic single thyroid nodule: Secondary | ICD-10-CM | POA: Diagnosis present

## 2021-06-02 ENCOUNTER — Ambulatory Visit: Payer: BC Managed Care – PPO | Admitting: Internal Medicine

## 2021-06-16 ENCOUNTER — Inpatient Hospital Stay: Payer: BC Managed Care – PPO | Attending: Oncology

## 2021-06-21 ENCOUNTER — Ambulatory Visit (INDEPENDENT_AMBULATORY_CARE_PROVIDER_SITE_OTHER): Payer: BC Managed Care – PPO | Admitting: Internal Medicine

## 2021-06-21 ENCOUNTER — Encounter: Payer: Self-pay | Admitting: Internal Medicine

## 2021-06-21 VITALS — BP 126/84 | HR 76 | Temp 98.2°F | Ht 64.2 in | Wt 277.4 lb

## 2021-06-21 DIAGNOSIS — E1159 Type 2 diabetes mellitus with other circulatory complications: Secondary | ICD-10-CM

## 2021-06-21 DIAGNOSIS — Z23 Encounter for immunization: Secondary | ICD-10-CM

## 2021-06-21 DIAGNOSIS — E1169 Type 2 diabetes mellitus with other specified complication: Secondary | ICD-10-CM

## 2021-06-21 DIAGNOSIS — Z6841 Body Mass Index (BMI) 40.0 and over, adult: Secondary | ICD-10-CM

## 2021-06-21 DIAGNOSIS — I152 Hypertension secondary to endocrine disorders: Secondary | ICD-10-CM

## 2021-06-21 DIAGNOSIS — I272 Pulmonary hypertension, unspecified: Secondary | ICD-10-CM

## 2021-06-21 NOTE — Patient Instructions (Signed)

## 2021-06-21 NOTE — Progress Notes (Signed)
?Rich Brave [place],acting as a Education administrator for Maximino Greenland, MD.,have documented all relevant documentation on the behalf of Maximino Greenland, MD,as directed by  Maximino Greenland, MD while in the presence of Maximino Greenland, MD.  ?This visit occurred during the SARS-CoV-2 public health emergency.  Safety protocols were in place, including screening questions prior to the visit, additional usage of staff PPE, and extensive cleaning of exam room while observing appropriate contact time as indicated for disinfecting solutions. ? ?Subjective:  ?  ? Patient ID: Kristen [name] , female    DOB: 1965/11/06 , 56 y.o.   MRN: [id number] ? ? ?Chief Complaint  ?Patient presents with  ? Diabetes  ? Hypertension  ? ? ?HPI ? ?The patient is here today for a follow-up on her diabetes and  hypertension.  She reports compliance with meds. She denies headaches, chest pain and shortness of breath. She states she feels great! ? ?Diabetes ?She presents for her follow-up diabetic visit. She has type 2 diabetes mellitus. There are no hypoglycemic associated symptoms. Pertinent negatives for hypoglycemia include no dizziness or headaches. Pertinent negatives for diabetes include no blurred vision and no chest pain. There are no hypoglycemic complications. Symptoms are stable. There are no diabetic complications. Risk factors for coronary artery disease include obesity and sedentary lifestyle. She is compliant with treatment all of the time.  ?Hypertension ?This is a chronic problem. The current episode started more than 1 year ago. The problem has been gradually improving since onset. The problem is controlled. Pertinent negatives include no blurred vision, chest pain, headaches, orthopnea, palpitations or shortness of breath. Risk factors for coronary artery disease include diabetes mellitus, dyslipidemia, obesity and sedentary lifestyle. Past treatments include diuretics and angiotensin blockers.   ? ?Past Medical History:  ?Diagnosis  Date  ? Alopecia   ? Arthritis   ? B12 deficiency   ? Diabetes ([place])   ? Type 2  ? Hypertension   ? Iron deficiency anemia 03/07/2018  ? Non-STEMI (non-ST elevated myocardial infarction) ([place]) 2020  ? Pulmonary embolism ([place])   ? URI (upper respiratory infection)   ? took antibiotic and prednisone oct 2019 resolved  ? Uses contact lenses   ?  ? ?Family History  ?Problem Relation Age of Onset  ? Hypertension Mother   ? Diabetes Mother   ? Hypertension Father   ? Diabetes Father   ? COPD Father   ? Stroke Father   ? Congestive Heart Failure Father   ? Diabetes Sister   ? Hypertension Sister   ? ? ? ?Current Outpatient Medications:  ?  apixaban (ELIQUIS) 2.5 MG TABS tablet, Take 1 tablet (2.5 mg total) by mouth 2 (two) times daily., Disp: 60 tablet, Rfl: 5 ?  atorvastatin (LIPITOR) 20 MG tablet, TAKE 1 TABLET BY MOUTH EVERY DAY, Disp: 90 tablet, Rfl: 3 ?  blood glucose meter kit and supplies KIT, Dispense based on patient and insurance preference. Use up to four times daily as directed. (FOR ICD-9 250.00, 250.01)., Disp: 1 each, Rfl: 0 ?  cetirizine (ZYRTEC) 10 MG tablet, Take 10 mg by mouth 2 (two) times daily. Pt takes twice a day, Disp: , Rfl:  ?  cyanocobalamin (,VITAMIN B-12,) 1000 MCG/ML injection, Inject into the muscle., Disp: , Rfl:  ?  dapagliflozin propanediol (FARXIGA) 10 MG TABS tablet, Take by mouth daily., Disp: , Rfl:  ?  docusate sodium (COLACE) 100 MG capsule, Take 100 mg by mouth 2 (two) times daily., Disp: ,  Rfl:  ?  FLUoxetine (PROZAC) 20 MG capsule, TAKE 1 CAPSULE BY MOUTH EVERY DAY, Disp: 90 capsule, Rfl: 1 ?  hydrochlorothiazide (MICROZIDE) 12.5 MG capsule, Take by mouth., Disp: , Rfl:  ?  metFORMIN (GLUCOPHAGE-XR) 500 MG 24 hr tablet, TAKE 1 TABLET BY MOUTH EVERY DAY, Disp: 90 tablet, Rfl: 1 ?  NONFORMULARY OR COMPOUNDED ITEM, Iron patch apply every 8 hours, Disp: , Rfl:  ?  olmesartan (BENICAR) 40 MG tablet, Take 40 mg by mouth daily., Disp: , Rfl:  ?  RYBELSUS 14 MG TABS, TAKE 1 TABLET BY  MOUTH EVERY DAY, Disp: 90 tablet, Rfl: 1 ?  Vitamin D, Ergocalciferol, (DRISDOL) 1.25 MG (50000 UNIT) CAPS capsule, TAKE ONE CAPSULE BY MOUTH TWICE WEEKLY ON TUES/FRIDAYS, Disp: 24 capsule, Rfl: 1 ?  acetaminophen (TYLENOL) 500 MG tablet, Take 1 tablet (500 mg total) by mouth every 6 (six) hours as needed. (Patient not taking: Reported on 03/17/2021), Disp: 30 tablet, Rfl: 0 ?  albuterol (PROVENTIL HFA;VENTOLIN HFA) 108 (90 Base) MCG/ACT inhaler, Inhale 1-2 puffs into the lungs every 6 (six) hours as needed for wheezing or shortness of breath. (Patient not taking: Reported on 03/17/2021), Disp: 1 Inhaler, Rfl: 2  ? ?Allergies  ?Allergen Reactions  ? Amoxicillin Hives  ?  Has patient had a PCN reaction causing immediate rash, facial/tongue/throat swelling, SOB or lightheadedness with hypotension: No ?Has patient had a PCN reaction causing severe rash involving mucus membranes or skin necrosis: Yes ?Has patient had a PCN reaction that required hospitalization No ?Has patient had a PCN reaction occurring within the last 10 years: No ?If all of the above answers are "NO", then may proceed with Cephalosporin use. ?  ? Fish Allergy Hives  ?  ? ?Review of Systems  ?Constitutional: Negative.   ?Eyes:  Negative for blurred vision.  ?Respiratory: Negative.  Negative for shortness of breath.   ?Cardiovascular: Negative.  Negative for chest pain, palpitations and orthopnea.  ?Gastrointestinal: Negative.   ?Neurological: Negative.  Negative for dizziness and headaches.  ?Psychiatric/Behavioral: Negative.     ? ?Today's Vitals  ? 06/21/21 1625  ?BP: 126/84  ?Pulse: 76  ?Temp: 98.2 ?F (36.8 ?C)  ?Weight: 277 lb 6.4 oz (125.8 kg)  ?Height: 5' 4.2" (1.631 m)  ?PainSc: 0-No pain  ? ?Body mass index is 47.32 kg/m?.  ?Wt Readings from Last 3 Encounters:  ?06/21/21 277 lb 6.4 oz (125.8 kg)  ?03/17/21 276 lb 4.8 oz (125.3 kg)  ?01/27/21 282 lb 3.2 oz (128 kg)  ?  ?Objective:  ?Physical Exam ?Vitals and nursing note reviewed.   ?Constitutional:   ?   Appearance: Normal appearance. She is obese.  ?HENT:  ?   Head: Normocephalic and atraumatic.  ?Eyes:  ?   Extraocular Movements: Extraocular movements intact.  ?Cardiovascular:  ?   Rate and Rhythm: Normal rate and regular rhythm.  ?   Heart sounds: Normal heart sounds.  ?Pulmonary:  ?   Effort: Pulmonary effort is normal.  ?   Breath sounds: Normal breath sounds.  ?Musculoskeletal:  ?   Cervical back: Normal range of motion.  ?Skin: ?   General: Skin is warm.  ?Neurological:  ?   General: No focal deficit present.  ?   Mental Status: She is alert.  ?Psychiatric:     ?   Mood and Affect: Mood normal.     ?   Behavior: Behavior normal.  ?   ?Assessment And Plan:  ?   ?1. Obesity, diabetes, and  hypertension syndrome ([place]) ?Comments: Chronic, BP well controlled. I will check DM labs as below, encouraged to follow dietary guidelines. She will f/u in 4 months for re-evaluation.  ?- BMP8+EGFR ?- Hemoglobin A1c ? ?2. Pulmonary hypertension, unspecified ([place]) ?Comments: Identified on echo Jan 2020, when hospitalized for PE. I plan to schedule f/u echo later this year.  ? ?3. Class 3 severe obesity due to excess calories without serious comorbidity with body mass index (BMI) of 45.0 to 49.9 in adult [facility]) ?Comments: BMI 47. She is encouraged to aim for at least 150 minutes of exercise per week, while initially striving for BMI<40 to decrease cardiac risk.  ? ?4. Immunization due ?- Varicella-zoster vaccine IM (Shingrix) ?  ?Patient was given opportunity to ask questions. Patient verbalized understanding of the plan and was able to repeat key elements of the plan. All questions were answered to their satisfaction.  ? ?I, Maximino Greenland, MD, have reviewed all documentation for this visit. The documentation on 06/21/21 for the exam, diagnosis, procedures, and orders are all accurate and complete.  ? ?IF YOU HAVE BEEN REFERRED TO A SPECIALIST, IT MAY TAKE 1-2 WEEKS TO SCHEDULE/PROCESS THE REFERRAL. IF  YOU HAVE NOT HEARD FROM US/SPECIALIST IN TWO WEEKS, PLEASE GIVE [place] A CALL AT [phone] X [phone].  ? ?THE PATIENT IS ENCOURAGED TO PRACTICE SOCIAL DISTANCING DUE TO THE COVID-19 PANDEMIC.   ?

## 2021-06-22 DIAGNOSIS — Z23 Encounter for immunization: Secondary | ICD-10-CM | POA: Diagnosis not present

## 2021-06-22 LAB — BMP8+EGFR
BUN/Creatinine Ratio: 19 (ref 9–23)
BUN: 24 mg/dL (ref 6–24)
CO2: 23 mmol/L (ref 20–29)
Calcium: 9.7 mg/dL (ref 8.7–10.2)
Chloride: 102 mmol/L (ref 96–106)
Creatinine, Ser: 1.26 mg/dL — ABNORMAL HIGH (ref 0.57–1.00)
Glucose: 87 mg/dL (ref 70–99)
Potassium: 4.6 mmol/L (ref 3.5–5.2)
Sodium: 141 mmol/L (ref 134–144)
eGFR: 50 mL/min/{1.73_m2} — ABNORMAL LOW (ref >59)

## 2021-06-22 LAB — HEMOGLOBIN A1C
Est. average glucose Bld gHb Est-mCnc: 143 mg/dL
Hgb A1c MFr Bld: 6.6 % — ABNORMAL HIGH (ref 4.8–5.6)
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
## 2021-07-21 ENCOUNTER — Inpatient Hospital Stay: Payer: BC Managed Care – PPO | Attending: Oncology

## 2021-08-01 IMAGING — US US THYROID
1 series · 13 of 25 positions shown · non-contrast
Comparison: None.

CLINICAL DATA: 53-year-old female with a history thyroid nodule
prior CT

EXAM:
THYROID ULTRASOUND
TECHNIQUE: Ultrasound examination of the thyroid gland and adjacent soft
tissues was performed.

[Series 1: us thyroid · 0.07mm/px · 13 of 53 slices shown]
[im 1/53]
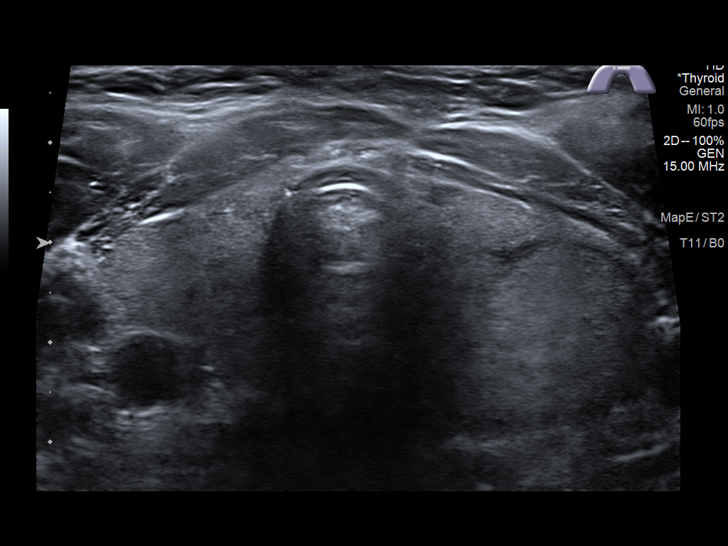
[im 5/53]
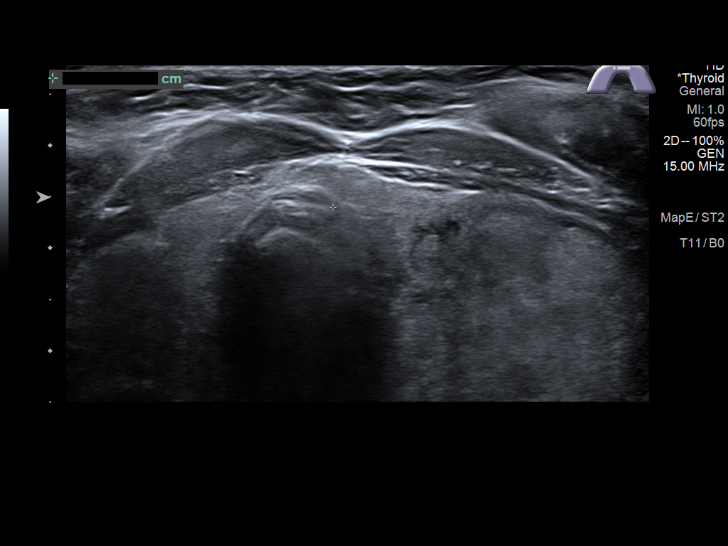
[im 9/53]
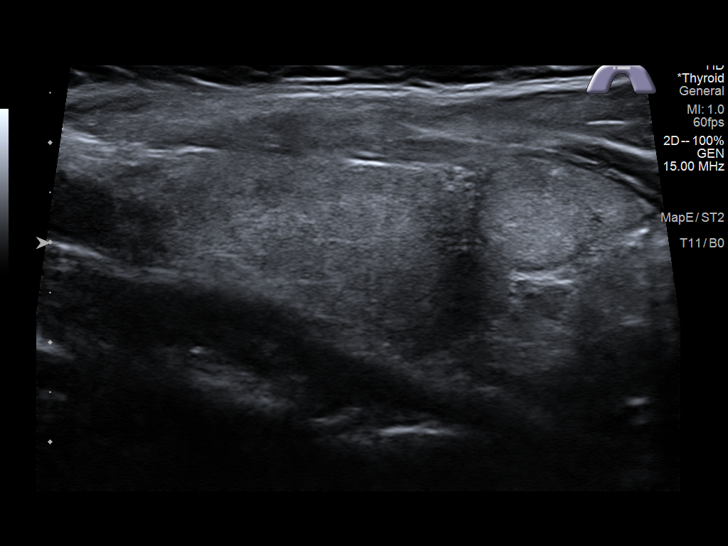
[im 14/53]
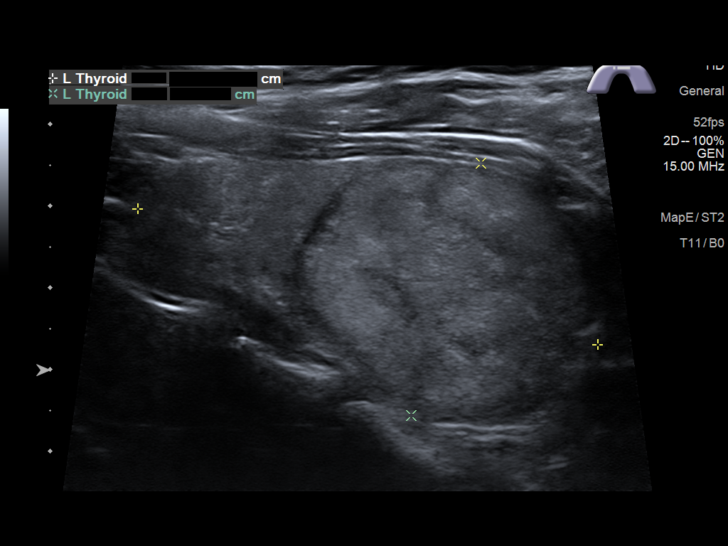
[im 18/53]
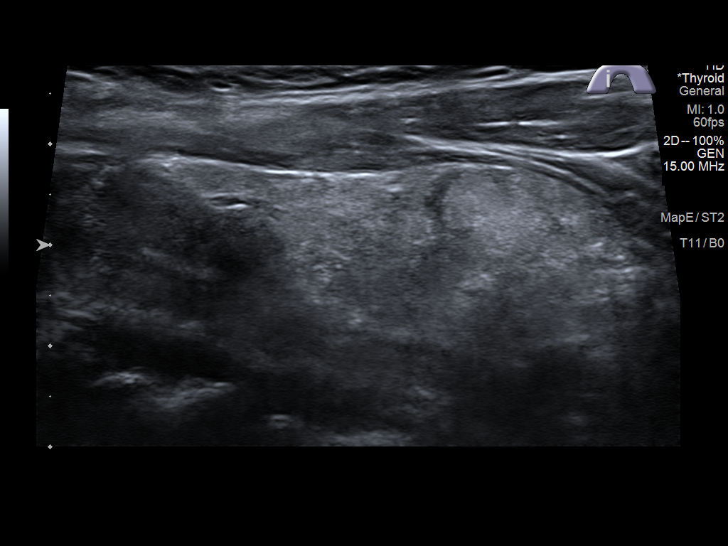
[im 22/53]
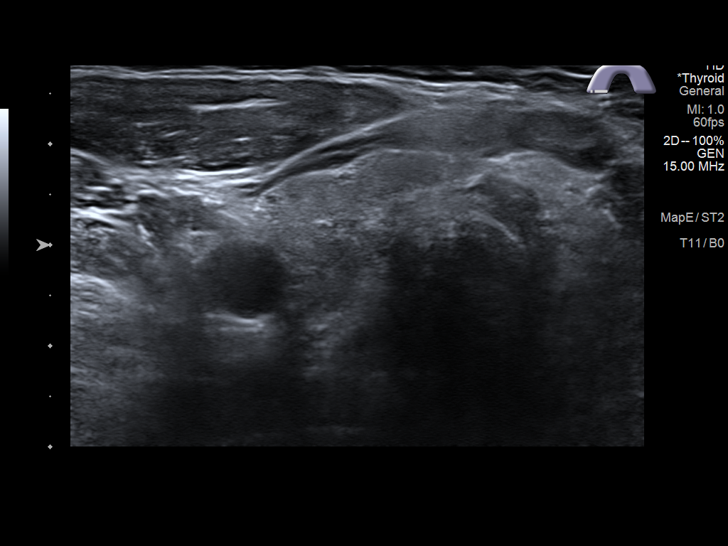
[im 27/53]
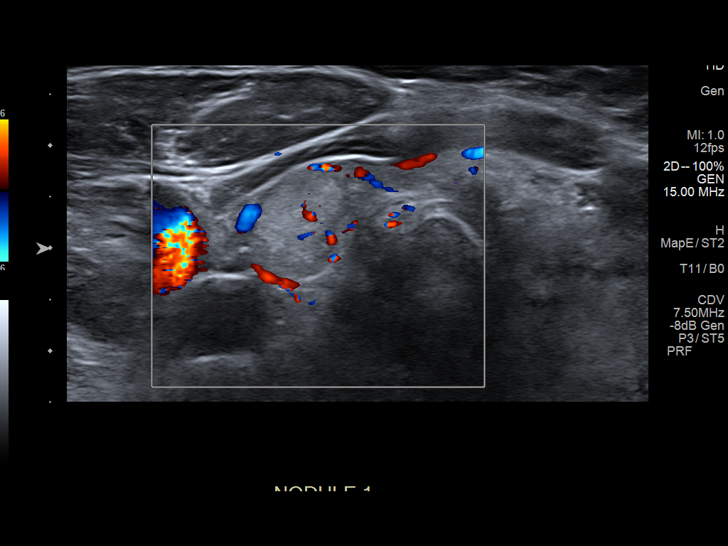
[im 31/53]
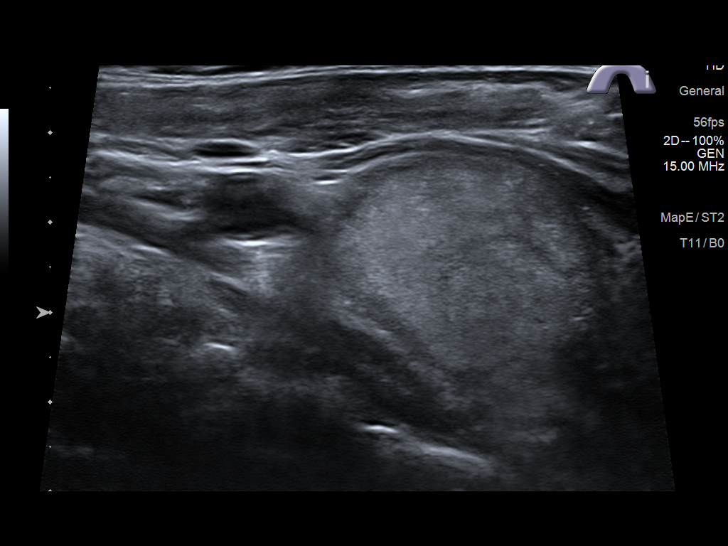
[im 35/53]
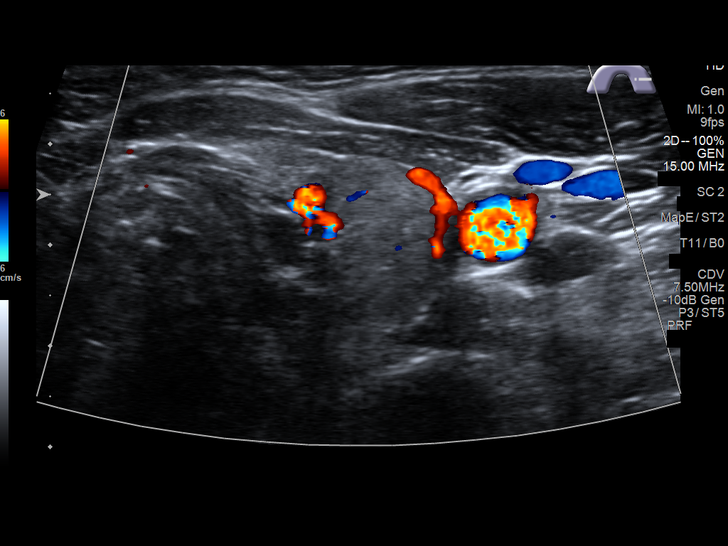
[im 40/53]
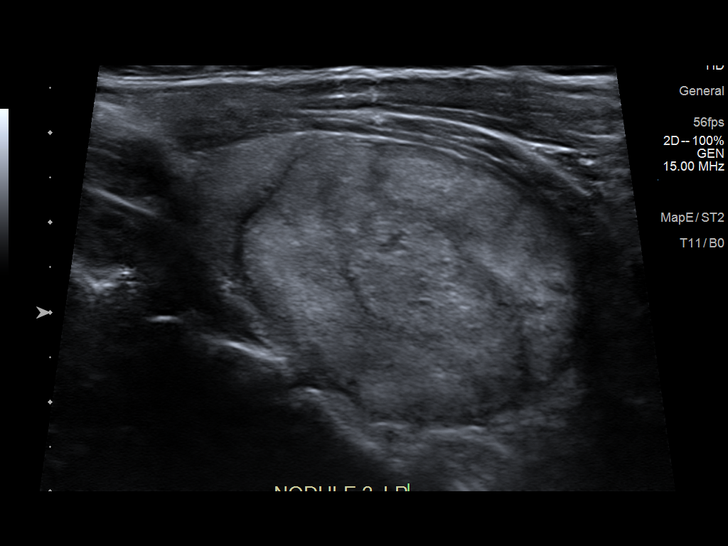
[im 44/53]
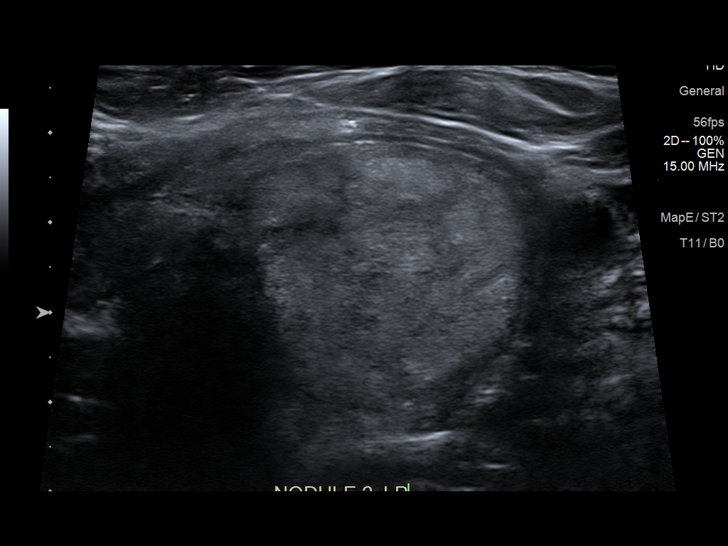
[im 48/53]
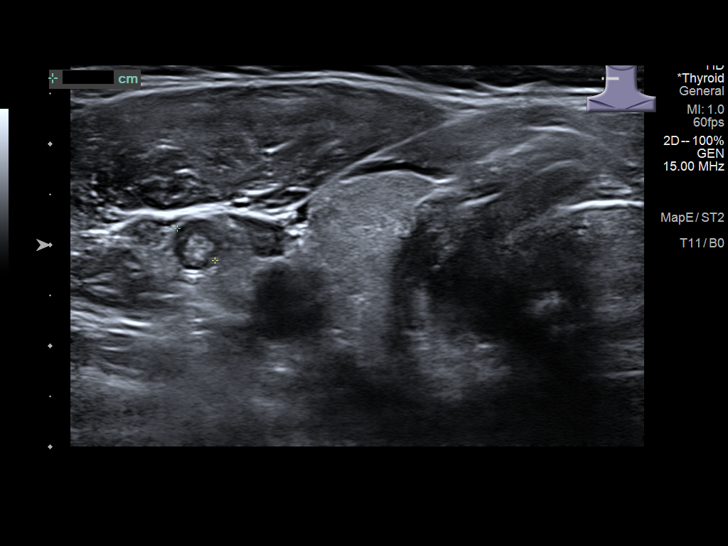
[im 53/53]
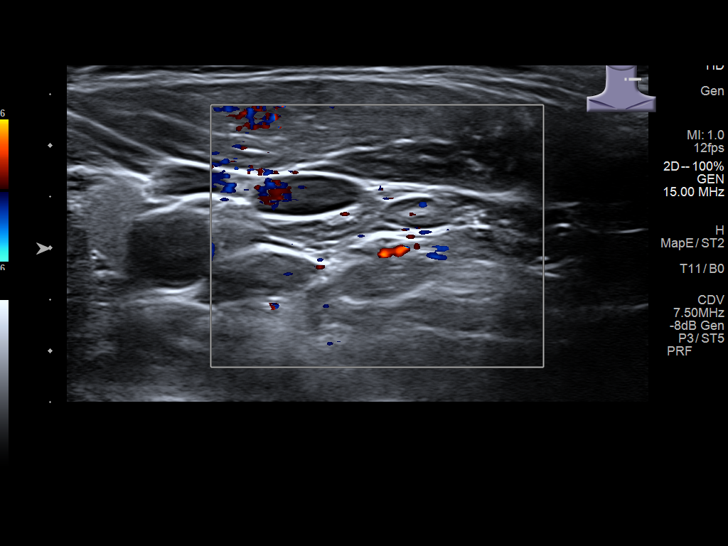

[13 of 25 positions shown; findings below may reference images not displayed]

FINDINGS: Parenchymal Echotexture: Normal

Isthmus: 0.5 cm

Right lobe: 5.2 cm x 1.9 cm x 1.7 cm

Left lobe: 5.9 cm x 3.2 cm x 3.0 cm

_________________________________________________________

Estimated total number of nodules >/= 1 cm: 2

Number of spongiform nodules >/=  2 cm not described below (TR1): 0

Number of mixed cystic and solid nodules >/= 1.5 cm not described
below (TR2): 0

_________________________________________________________

Nodule # 1:

Location: Right; Inferior

Maximum size: 1.2 cm; Other 2 dimensions: 1.0 cm x 0.9 cm

Composition: solid/almost completely solid (2)

Echogenicity: isoechoic (1)

Shape: not taller-than-wide (0)

Margins: smooth (0)

Echogenic foci: none (0)

ACR TI-RADS total points: 3.

ACR TI-RADS risk category: TR3 (3 points).

ACR TI-RADS recommendations:

Nodule does not meet criteria for surveillance or biopsy

_________________________________________________________

Nodule # 2:

Location: Left; Inferior

Maximum size: 3.9 cm; Other 2 dimensions: 3.1 cm x 2.9 cm

Composition: solid/almost completely solid (2)

Echogenicity: isoechoic (1)

Shape: not taller-than-wide (0)

Margins: smooth (0)

Echogenic foci: none (0)

ACR TI-RADS total points: 3.

ACR TI-RADS risk category: TR3 (3 points).

ACR TI-RADS recommendations:

Nodule meets criteria for biopsy

_________________________________________________________

No adenopathy
IMPRESSION: Left inferior thyroid nodule (labeled 2) meets criteria for biopsy,
as designated by the newly established ACR TI-RADS criteria, and
referral for biopsy is recommended.

Recommendations follow those established by the new ACR TI-RADS
criteria ([HOSPITAL] 4922;[DATE]).

## 2021-08-18 ENCOUNTER — Inpatient Hospital Stay: Payer: BC Managed Care – PPO | Attending: Oncology

## 2021-09-02 ENCOUNTER — Encounter (HOSPITAL_COMMUNITY): Payer: Self-pay | Admitting: *Deleted

## 2021-09-03 ENCOUNTER — Other Ambulatory Visit: Payer: Self-pay | Admitting: Internal Medicine

## 2021-09-10 ENCOUNTER — Other Ambulatory Visit: Payer: Self-pay | Admitting: Internal Medicine

## 2021-09-14 ENCOUNTER — Inpatient Hospital Stay: Payer: BC Managed Care – PPO | Attending: Oncology

## 2021-09-14 DIAGNOSIS — Z880 Allergy status to penicillin: Secondary | ICD-10-CM | POA: Diagnosis not present

## 2021-09-14 DIAGNOSIS — E538 Deficiency of other specified B group vitamins: Secondary | ICD-10-CM | POA: Diagnosis present

## 2021-09-14 DIAGNOSIS — Z86711 Personal history of pulmonary embolism: Secondary | ICD-10-CM | POA: Insufficient documentation

## 2021-09-14 DIAGNOSIS — R55 Syncope and collapse: Secondary | ICD-10-CM | POA: Diagnosis not present

## 2021-09-14 DIAGNOSIS — E119 Type 2 diabetes mellitus without complications: Secondary | ICD-10-CM | POA: Insufficient documentation

## 2021-09-14 DIAGNOSIS — R778 Other specified abnormalities of plasma proteins: Secondary | ICD-10-CM | POA: Diagnosis not present

## 2021-09-14 DIAGNOSIS — D500 Iron deficiency anemia secondary to blood loss (chronic): Secondary | ICD-10-CM

## 2021-09-14 DIAGNOSIS — Z9884 Bariatric surgery status: Secondary | ICD-10-CM | POA: Diagnosis not present

## 2021-09-14 DIAGNOSIS — R42 Dizziness and giddiness: Secondary | ICD-10-CM | POA: Diagnosis not present

## 2021-09-14 DIAGNOSIS — Z823 Family history of stroke: Secondary | ICD-10-CM | POA: Diagnosis not present

## 2021-09-14 DIAGNOSIS — D509 Iron deficiency anemia, unspecified: Secondary | ICD-10-CM | POA: Insufficient documentation

## 2021-09-14 DIAGNOSIS — Z79899 Other long term (current) drug therapy: Secondary | ICD-10-CM | POA: Diagnosis not present

## 2021-09-14 DIAGNOSIS — Z7901 Long term (current) use of anticoagulants: Secondary | ICD-10-CM | POA: Diagnosis not present

## 2021-09-14 DIAGNOSIS — I2699 Other pulmonary embolism without acute cor pulmonale: Secondary | ICD-10-CM | POA: Diagnosis present

## 2021-09-14 DIAGNOSIS — Z8249 Family history of ischemic heart disease and other diseases of the circulatory system: Secondary | ICD-10-CM | POA: Diagnosis not present

## 2021-09-14 DIAGNOSIS — I252 Old myocardial infarction: Secondary | ICD-10-CM | POA: Insufficient documentation

## 2021-09-14 DIAGNOSIS — Z836 Family history of other diseases of the respiratory system: Secondary | ICD-10-CM | POA: Insufficient documentation

## 2021-09-14 DIAGNOSIS — Z833 Family history of diabetes mellitus: Secondary | ICD-10-CM | POA: Insufficient documentation

## 2021-09-14 LAB — COMPREHENSIVE METABOLIC PANEL
ALT: 15 U/L (ref 0–44)
AST: 26 U/L (ref 15–41)
Albumin: 4 g/dL (ref 3.5–5.0)
Alkaline Phosphatase: 58 U/L (ref 38–126)
Anion gap: 8 (ref 5–15)
BUN: 24 mg/dL — ABNORMAL HIGH (ref 6–20)
CO2: 27 mmol/L (ref 22–32)
Calcium: 9.2 mg/dL (ref 8.9–10.3)
Chloride: 106 mmol/L (ref 98–111)
Creatinine, Ser: 1.28 mg/dL — ABNORMAL HIGH (ref 0.44–1.00)
GFR, Estimated: 49 mL/min — ABNORMAL LOW (ref >60)
Glucose, Bld: 100 mg/dL — ABNORMAL HIGH (ref 70–99)
Potassium: 4.4 mmol/L (ref 3.5–5.1)
Sodium: 141 mmol/L (ref 135–145)
Total Bilirubin: 0.4 mg/dL (ref 0.3–1.2)
Total Protein: 7.9 g/dL (ref 6.5–8.1)

## 2021-09-14 LAB — CBC WITH DIFFERENTIAL/PLATELET
Abs Immature Granulocytes: 0.02 10*3/uL (ref 0.00–0.07)
Basophils Absolute: 0 10*3/uL (ref 0.0–0.1)
Basophils Relative: 0 %
Eosinophils Absolute: 0.1 10*3/uL (ref 0.0–0.5)
Eosinophils Relative: 1 %
HCT: 35.6 % — ABNORMAL LOW (ref 36.0–46.0)
Hemoglobin: 11.4 g/dL — ABNORMAL LOW (ref 12.0–15.0)
Immature Granulocytes: 0 %
Lymphocytes Relative: 40 %
Lymphs Abs: 2.4 10*3/uL (ref 0.7–4.0)
MCH: 29.3 pg (ref 26.0–34.0)
MCHC: 32 g/dL (ref 30.0–36.0)
MCV: 91.5 fL (ref 80.0–100.0)
Monocytes Absolute: 0.4 10*3/uL (ref 0.1–1.0)
Monocytes Relative: 7 %
Neutro Abs: 3 10*3/uL (ref 1.7–7.7)
Neutrophils Relative %: 52 %
Platelets: 319 10*3/uL (ref 150–400)
RBC: 3.89 MIL/uL (ref 3.87–5.11)
RDW: 14.6 % (ref 11.5–15.5)
WBC: 5.9 10*3/uL (ref 4.0–10.5)
nRBC: 0 % (ref 0.0–0.2)

## 2021-09-14 LAB — VITAMIN B12: Vitamin B-12: 285 pg/mL (ref 180–914)

## 2021-09-14 LAB — IRON AND TIBC
Iron: 70 ug/dL (ref 28–170)
Saturation Ratios: 21 % (ref 10.4–31.8)
TIBC: 328 ug/dL (ref 250–450)
UIBC: 258 ug/dL

## 2021-09-14 LAB — FERRITIN: Ferritin: 108 ng/mL (ref 11–307)

## 2021-09-15 ENCOUNTER — Inpatient Hospital Stay: Payer: BC Managed Care – PPO

## 2021-09-15 ENCOUNTER — Encounter: Payer: Self-pay | Admitting: Oncology

## 2021-09-15 ENCOUNTER — Inpatient Hospital Stay (HOSPITAL_BASED_OUTPATIENT_CLINIC_OR_DEPARTMENT_OTHER): Payer: BC Managed Care – PPO | Admitting: Oncology

## 2021-09-15 VITALS — BP 106/80 | HR 83 | Temp 98.2°F | Wt 270.0 lb

## 2021-09-15 DIAGNOSIS — E538 Deficiency of other specified B group vitamins: Secondary | ICD-10-CM | POA: Diagnosis not present

## 2021-09-15 DIAGNOSIS — D508 Other iron deficiency anemias: Secondary | ICD-10-CM

## 2021-09-15 DIAGNOSIS — Z9884 Bariatric surgery status: Secondary | ICD-10-CM | POA: Diagnosis not present

## 2021-09-15 DIAGNOSIS — I2699 Other pulmonary embolism without acute cor pulmonale: Secondary | ICD-10-CM | POA: Diagnosis not present

## 2021-09-15 DIAGNOSIS — Z86711 Personal history of pulmonary embolism: Secondary | ICD-10-CM | POA: Diagnosis not present

## 2021-09-15 DIAGNOSIS — D500 Iron deficiency anemia secondary to blood loss (chronic): Secondary | ICD-10-CM

## 2021-09-15 MED ORDER — CYANOCOBALAMIN 1000 MCG/ML IJ SOLN
1000.0000 ug | Freq: Once | INTRAMUSCULAR | Status: AC
  Administered 2021-09-15: 1000 ug via INTRAMUSCULAR
  Filled 2021-09-15: qty 1

## 2021-09-15 MED ORDER — APIXABAN 2.5 MG PO TABS: 2.5000 mg | ORAL_TABLET | Freq: Two times a day (BID) | ORAL | 1 refills | Status: DC

## 2021-09-15 NOTE — Assessment & Plan Note (Addendum)
continue Eliquis 2.5mg BID.  Refilled Rx 

## 2021-09-15 NOTE — Progress Notes (Signed)
Hematology/Oncology Progress note [phone] [phone]     Patient Care Team: Glendale Chard, MD as PCP - General (Internal Medicine) Earlie Server, MD as Consulting Physician (Oncology)    ASSESSMENT & PLAN:   History of pulmonary embolism continue Eliquis 2.58m BID.  Refilled Rx  Vitamin B12 deficiency  Continue monthly parenteral vitamin b12 10033m injections  Iron deficiency anemia History of bariatric surgery/gastric sleeve Labs are reviewed and discussed with patient. No need for Venofer   Orders Placed This Encounter  Procedures   CBC with Differential/Platelet    Standing Status:   Future    Standing Expiration Date:   09/16/2022   Comprehensive metabolic panel    Standing Status:   Future    Standing Expiration Date:   09/16/2022   Ferritin    Standing Status:   Future    Standing Expiration Date:   09/16/2022   Iron and TIBC    Standing Status:   Future    Standing Expiration Date:   09/16/2022   Vitamin B12    Standing Status:   Future    Standing Expiration Date:   09/16/2022   Follow up in 6 months All questions were answered. The patient knows to call the clinic with any problems, questions or concerns.  ZhEarlie ServerMD, PhD [facility]cology 09/15/2021   REASON FOR VISIT  follow-up for management of pulmonary embolism  PERTINENT HEMATOLOGY HISTORY Kristen [name] a [age].o.afemale who has above oncology history reviewed by me today presented for follow up visit for management of pulmonary embolism.  Patient was seen by me during hospitalization.. Patient presents to ER for evaluation of syncope episode. Patient fainted but did not fall or hurt herself as family member caught patient.  EKG in ED showed ST changed. Troponin elevated and started on heparin drip and admitted.  On day 1 of admission, CODE BLUE was called and patient was unresponsive and received brief chest compression. Patient woke up and talked.  No  cardiac arrest.  She reports feeling dizzy and lightheaded and passed out.  Similar to the syncope episode prior to admission. Cardiac catheterization showed normal LV systolic function and hyperdynamic in nature with ejection fraction of 70% consistent with hyper trophic cardiomyopathy.  Significantly elevated pulmonary pressure possibly consistent with pulmonary embolism.  No cardiac intervention due to normal coronary arteries. Patient had VQ scan on 02/15/2018 which showed hypermobility for pulmonary embolism Patient was on heparin drip and was transitioned to Eliquis 10 mg twice daily started on 02/16/2018.  # PE finished 6 months of Eliquis 67m50mID.  # History of gastric sleeve  INTERVAL HISTORY Kristen [name] a [age]o. y.o.female who has above oncology history reviewed by me today presented for follow up visit for management of pulmonary embolism, iron deficiency, vitamin B12 deficiency.  Patient reports feeling well.  She takes Eliquis 2.5 mg twice daily.no bleeding events.  She missed several B12 injection appointment.    . Review of Systems  Constitutional:  Negative for appetite change, chills, fatigue and fever.  HENT:   Negative for hearing loss and voice change.   Eyes:  Negative for eye problems.  Respiratory:  Negative for chest tightness, cough and shortness of breath.   Cardiovascular:  Negative for chest pain.  Gastrointestinal:  Negative for abdominal distention, abdominal pain and blood in stool.  Endocrine: Negative for hot flashes.  Genitourinary:  Negative for difficulty urinating and frequency.   Musculoskeletal:  Negative for arthralgias.  Skin:  Negative for itching and rash.  Neurological:  Negative for extremity weakness.  Hematological:  Negative for adenopathy.  Psychiatric/Behavioral:  Negative for confusion.       Allergies  Allergen Reactions   Amoxicillin Hives    Has patient had a PCN reaction causing immediate rash, facial/tongue/throat  swelling, SOB or lightheadedness with hypotension: No Has patient had a PCN reaction causing severe rash involving mucus membranes or skin necrosis: Yes Has patient had a PCN reaction that required hospitalization No Has patient had a PCN reaction occurring within the last 10 years: No If all of the above answers are "NO", then may proceed with Cephalosporin use.    Fish Allergy Hives     Past Medical History:  Diagnosis Date   Alopecia    Arthritis    B12 deficiency    Diabetes ([place])    Type 2   Hypertension    Iron deficiency anemia 03/07/2018   Non-STEMI (non-ST elevated myocardial infarction) ([place]) 2020   Pulmonary embolism (HCC)    URI (upper respiratory infection)    took antibiotic and prednisone oct 2019 resolved   Uses contact lenses      Past Surgical History:  Procedure Laterality Date   BREAST BIOPSY Right 2015   COLONOSCOPY WITH PROPOFOL N/A 03/18/2016   Procedure: COLONOSCOPY WITH PROPOFOL;  Surgeon: Carol Ada, MD;  Location: [facility];  Service: Endoscopy;  Laterality: N/A;   LAPAROSCOPIC GASTRIC SLEEVE RESECTION N/A 01/29/2018   Procedure: LAPAROSCOPIC GASTRIC SLEEVE RESECTION WITH UPPER ENDO AND ERAS PATHWAY;  Surgeon: Kinsinger, Arta Bruce, MD;  Location: [facility];  Service: General;  Laterality: N/A;   RIGHT/LEFT HEART CATH AND CORONARY ANGIOGRAPHY N/A 02/15/2018   Procedure: RIGHT/LEFT HEART CATH AND CORONARY ANGIOGRAPHY;  Surgeon: Corey Skains, MD;  Location: [place] [facility];  Service: Cardiovascular;  Laterality: N/A;   TOTAL HIP ARTHROPLASTY Right 08/04/2017   Procedure: RIGHT TOTAL HIP ARTHROPLASTY ANTERIOR APPROACH;  Surgeon: Mcarthur Rossetti, MD;  Location: [facility];  Service: Orthopedics;  Laterality: Right;    Social History   Socioeconomic History   Marital status: Married    Spouse name: Not on file   Number of children: Not on file   Years of education: Not on file   Highest education level: Not on file  Occupational History    Not on file  Tobacco Use   Smoking status: Never   Smokeless tobacco: Never  Vaping Use   Vaping Use: Never used  Substance and Sexual Activity   Alcohol use: Not Currently    Comment: OCCASIONAL    Drug use: No   Sexual activity: Yes    Birth control/protection: None  Other Topics Concern   Not on file  Social History Narrative   Not on file   Social Determinants of Health   Financial Resource Strain: Not on file  Food Insecurity: Not on file  Transportation Needs: Not on file  Physical Activity: Not on file  Stress: Not on file  Social Connections: Not on file  Intimate Partner Violence: Not on file    Family History  Problem Relation Age of Onset   Hypertension Mother    Diabetes Mother    Hypertension Father    Diabetes Father    COPD Father    Stroke Father    Congestive Heart Failure Father    Diabetes Sister    Hypertension Sister      Current Outpatient Medications:  atorvastatin (LIPITOR) 20 MG tablet, TAKE 1 TABLET BY MOUTH EVERY DAY, Disp: 90 tablet, Rfl: 3   blood glucose meter kit and supplies KIT, Dispense based on patient and insurance preference. Use up to four times daily as directed. (FOR ICD-9 250.00, 250.01)., Disp: 1 each, Rfl: 0   cetirizine (ZYRTEC) 10 MG tablet, Take 10 mg by mouth 2 (two) times daily. Pt takes twice a day, Disp: , Rfl:    cyanocobalamin (,VITAMIN B-12,) 1000 MCG/ML injection, Inject into the muscle., Disp: , Rfl:    dapagliflozin propanediol (FARXIGA) 10 MG TABS tablet, Take by mouth daily., Disp: , Rfl:    docusate sodium (COLACE) 100 MG capsule, Take 100 mg by mouth 2 (two) times daily., Disp: , Rfl:    FLUoxetine (PROZAC) 20 MG capsule, TAKE 1 CAPSULE BY MOUTH EVERY DAY, Disp: 90 capsule, Rfl: 1   hydrochlorothiazide (MICROZIDE) 12.5 MG capsule, Take by mouth., Disp: , Rfl:    metFORMIN (GLUCOPHAGE-XR) 500 MG 24 hr tablet, TAKE 1 TABLET BY MOUTH EVERY DAY, Disp: 90 tablet, Rfl: 1   NONFORMULARY OR COMPOUNDED ITEM,  Iron patch apply every 8 hours, Disp: , Rfl:    olmesartan (BENICAR) 40 MG tablet, Take 40 mg by mouth daily., Disp: , Rfl:    RYBELSUS 14 MG TABS, TAKE 1 TABLET BY MOUTH EVERY DAY, Disp: 90 tablet, Rfl: 1   Vitamin D, Ergocalciferol, (DRISDOL) 1.25 MG (50000 UNIT) CAPS capsule, TAKE ONE CAPSULE BY MOUTH TWICE WEEKLY ON TUES/FRIDAYS, Disp: 24 capsule, Rfl: 1   acetaminophen (TYLENOL) 500 MG tablet, Take 1 tablet (500 mg total) by mouth every 6 (six) hours as needed. (Patient not taking: Reported on 03/17/2021), Disp: 30 tablet, Rfl: 0   albuterol (PROVENTIL HFA;VENTOLIN HFA) 108 (90 Base) MCG/ACT inhaler, Inhale 1-2 puffs into the lungs every 6 (six) hours as needed for wheezing or shortness of breath. (Patient not taking: Reported on 03/17/2021), Disp: 1 Inhaler, Rfl: 2   apixaban (ELIQUIS) 2.5 MG TABS tablet, Take 1 tablet (2.5 mg total) by mouth 2 (two) times daily., Disp: 180 tablet, Rfl: 1  Physical exam:  Vitals:   09/15/21 1350  BP: 106/80  Pulse: 83  Temp: 98.2 F (36.8 C)  TempSrc: Tympanic  Weight: 270 lb (122.5 kg)   Physical Exam Constitutional:      General: She is not in acute distress.    Comments: Morbidly obese.   HENT:     Head: Normocephalic and atraumatic.     Comments: Chronic alopecia Eyes:     General: No scleral icterus.    Pupils: Pupils are equal, round, and reactive to light.  Cardiovascular:     Rate and Rhythm: Normal rate and regular rhythm.     Heart sounds: Normal heart sounds.  Pulmonary:     Effort: Pulmonary effort is normal. No respiratory distress.     Breath sounds: No wheezing.  Abdominal:     General: Bowel sounds are normal. There is no distension.     Palpations: Abdomen is soft. There is no mass.     Tenderness: There is no abdominal tenderness.  Musculoskeletal:        General: No deformity. Normal range of motion.     Cervical back: Normal range of motion and neck supple.  Skin:    General: Skin is warm and dry.     Findings: No  erythema or rash.  Neurological:     Mental Status: She is alert and oriented to person, place, and time.  Cranial Nerves: No cranial nerve deficit.     Coordination: Coordination normal.  Psychiatric:        Behavior: Behavior normal.        Thought Content: Thought content normal.    LABORATORY STUDIES    Latest Ref Rng & Units 09/14/2021    1:57 PM 03/15/2021   12:42 PM 10/26/2020   12:45 PM  CBC  WBC 4.0 - 10.5 K/uL 5.9  5.3  5.7   Hemoglobin 12.0 - 15.0 g/dL 11.4  11.7  12.0   Hematocrit 36.0 - 46.0 % 35.6  37.8  36.3   Platelets 150 - 400 K/uL 319  328  343       Latest Ref Rng & Units 09/14/2021    1:57 PM 06/21/2021    4:57 PM 03/15/2021   12:42 PM  CMP  Glucose 70 - 99 mg/dL 100  87  107   BUN 6 - 20 mg/dL '24  24  25   ' Creatinine 0.44 - 1.00 mg/dL 1.28  1.26  1.17   Sodium 135 - 145 mmol/L 141  141  139   Potassium 3.5 - 5.1 mmol/L 4.4  4.6  4.2   Chloride 98 - 111 mmol/L 106  102  105   CO2 22 - 32 mmol/L '27  23  27   ' Calcium 8.9 - 10.3 mg/dL 9.2  9.7  9.2   Total Protein 6.5 - 8.1 g/dL 7.9   8.3   Total Bilirubin 0.3 - 1.2 mg/dL 0.4   0.3   Alkaline Phos 38 - 126 U/L 58   56   AST 15 - 41 U/L 26   23   ALT 0 - 44 U/L 15   13    Iron/TIBC/Ferritin/ %Sat    Component Value Date/Time   IRON 70 09/14/2021 1357   TIBC 328 09/14/2021 1357   FERRITIN 108 09/14/2021 1357   IRONPCTSAT 21 09/14/2021 1357      RADIOGRAPHIC STUDIES: I have personally reviewed the radiological images as listed and agreed with the findings in the report. No results found.

## 2021-09-15 NOTE — Assessment & Plan Note (Signed)
History of bariatric surgery/gastric sleeve Labs are reviewed and discussed with patient. No need for Venofer

## 2021-09-15 NOTE — Assessment & Plan Note (Signed)
Continue monthly parenteral vitamin b12 1000mcg injections 

## 2021-10-07 ENCOUNTER — Other Ambulatory Visit: Payer: Self-pay | Admitting: Internal Medicine

## 2021-10-20 ENCOUNTER — Inpatient Hospital Stay: Payer: BC Managed Care – PPO | Attending: Oncology

## 2021-11-04 ENCOUNTER — Encounter: Payer: BC Managed Care – PPO | Admitting: Internal Medicine

## 2021-11-17 ENCOUNTER — Inpatient Hospital Stay: Payer: BC Managed Care – PPO | Attending: Oncology

## 2021-11-19 ENCOUNTER — Inpatient Hospital Stay: Payer: BC Managed Care – PPO

## 2021-12-10 ENCOUNTER — Other Ambulatory Visit: Payer: Self-pay | Admitting: Internal Medicine

## 2021-12-17 ENCOUNTER — Other Ambulatory Visit: Payer: Self-pay | Admitting: Internal Medicine

## 2021-12-20 ENCOUNTER — Inpatient Hospital Stay: Payer: BC Managed Care – PPO

## 2021-12-22 ENCOUNTER — Inpatient Hospital Stay: Payer: BC Managed Care – PPO | Attending: Oncology

## 2021-12-23 ENCOUNTER — Ambulatory Visit: Payer: Self-pay

## 2021-12-23 NOTE — Patient Outreach (Signed)
  Care Coordination   12/23/2021 Name: Kristen [name] MRN: [id number] DOB: May 16, 1965   Care Coordination Outreach Attempts:  An unsuccessful telephone outreach was attempted today to offer the patient information about available care coordination services as a benefit of their health plan.   Follow Up Plan:  Additional outreach attempts will be made to offer the patient care coordination information and services.   Encounter Outcome:  No Answer  Care Coordination Interventions Activated:  No   Care Coordination Interventions:  No, not indicated    Daneen Schick, BSW, CDP Social Worker, Certified Dementia Practitioner [facility]  Care Coordination [phone]

## 2021-12-28 ENCOUNTER — Encounter: Payer: Self-pay | Admitting: Nurse Practitioner

## 2021-12-28 ENCOUNTER — Other Ambulatory Visit: Payer: Self-pay

## 2021-12-28 ENCOUNTER — Telehealth (INDEPENDENT_AMBULATORY_CARE_PROVIDER_SITE_OTHER): Payer: BC Managed Care – PPO | Admitting: Nurse Practitioner

## 2021-12-28 VITALS — BP 118/74 | Ht 64.0 in

## 2021-12-28 DIAGNOSIS — E1169 Type 2 diabetes mellitus with other specified complication: Secondary | ICD-10-CM

## 2021-12-28 DIAGNOSIS — E119 Type 2 diabetes mellitus without complications: Secondary | ICD-10-CM

## 2021-12-28 DIAGNOSIS — I272 Pulmonary hypertension, unspecified: Secondary | ICD-10-CM | POA: Diagnosis not present

## 2021-12-28 MED ORDER — HYDROCHLOROTHIAZIDE 12.5 MG PO CAPS: 12.5000 mg | ORAL_CAPSULE | Freq: Two times a day (BID) | ORAL | 1 refills | Status: DC

## 2021-12-28 NOTE — Progress Notes (Signed)
Virtual Visit via MyChart   This visit type was conducted due to national recommendations for restrictions regarding the COVID-19 Pandemic (e.g. social distancing) in an effort to limit this patient's exposure and mitigate transmission in our community.  Due to her co-morbid illnesses, this patient is at least at moderate risk for complications without adequate follow up.  This format is felt to be most appropriate for this patient at this time.  All issues noted in this document were discussed and addressed.  A limited physical exam was performed with this format.    This visit type was conducted due to national recommendations for restrictions regarding the COVID-19 Pandemic (e.g. social distancing) in an effort to limit this patient's exposure and mitigate transmission in our community.  Patients identity confirmed using two different identifiers.  This format is felt to be most appropriate for this patient at this time.  All issues noted in this document were discussed and addressed.  No physical exam was performed (except for noted visual exam findings with Video Visits).    Date:  01/11/2022   ID:  Kristen [name], DOB 02/28/1965, MRN [id number]  Patient Location:  Home - spoke with [name]  Provider location:   Office    Chief Complaint:  follow up BP  History of Present Illness:    Kristen [name] is a 56 y.o. female who presents via video conferencing for a telehealth visit today.    The patient does have symptoms concerning for COVID-19 infection (fever, chills, cough, or new shortness of breath).   The patient is here today for a follow-up on her diabetes and  hypertension.  She reports compliance with meds. She has been taking 12.5 mg of HCTZ two times a day. 118/74. She continues to go to Nephrology due to Acute renal failure - seen a few months ago and is taking olmesartan 40 mg doing well.   She denies headaches, chest pain and shortness of breath.    Pt adds, her husband did test pos for covid this past Sunday , she does have a scratchy throat & mild cough. She has taken 2 at home tests  &will take another one today. She is now having a scratchy throat.   She can now wear a size 20 jeans which she feels this is good for her. 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   Hypertension This is a chronic problem. The current episode started more than 1 year ago. The problem has been gradually improving since onset. The problem is controlled. Pertinent negatives include no blurred vision, chest pain, headaches, orthopnea, palpitations or shortness of breath. Risk factors for coronary artery disease include diabetes mellitus, dyslipidemia, obesity and sedentary lifestyle. Past treatments include diuretics and angiotensin blockers.  Diabetes She presents for her follow-up diabetic visit. She has type 2 diabetes mellitus. There are no hypoglycemic associated symptoms. Pertinent negatives for hypoglycemia include no dizziness or headaches. Pertinent negatives for diabetes include no blurred vision and no chest pain. There are no hypoglycemic complications. Symptoms are stable. There are no diabetic complications. Risk factors for coronary artery disease include obesity and sedentary lifestyle. She is compliant with treatment all of the time.     Past Medical History:  Diagnosis Date   Alopecia    Arthritis    B12 deficiency    Diabetes ([place])    Type 2   Hypertension    Iron deficiency anemia 03/07/2018   Non-STEMI (  non-ST elevated myocardial infarction) ([place]) 2020   Pulmonary embolism ([place])    URI (upper respiratory infection)    took antibiotic and prednisone oct 2019 resolved   Uses contact lenses    Past Surgical History:  Procedure Laterality Date   BREAST BIOPSY Right 2015   COLONOSCOPY WITH PROPOFOL N/A 03/18/2016   Procedure: COLONOSCOPY WITH PROPOFOL;  Surgeon: Carol Ada, MD;   [facility];  Service: Endoscopy;  Laterality: N/A;   LAPAROSCOPIC GASTRIC SLEEVE RESECTION N/A 01/29/2018   Procedure: LAPAROSCOPIC GASTRIC SLEEVE RESECTION WITH UPPER ENDO AND ERAS PATHWAY;  Surgeon: Kinsinger, Arta Bruce, MD;  Location: [facility];  Service: General;  Laterality: N/A;   RIGHT/LEFT HEART CATH AND CORONARY ANGIOGRAPHY N/A 02/15/2018   Procedure: RIGHT/LEFT HEART CATH AND CORONARY ANGIOGRAPHY;  Surgeon: Corey Skains, MD;  Location: [place] [facility];  Service: Cardiovascular;  Laterality: N/A;   TOTAL HIP ARTHROPLASTY Right 08/04/2017   Procedure: RIGHT TOTAL HIP ARTHROPLASTY ANTERIOR APPROACH;  Surgeon: Mcarthur Rossetti, MD;  [facility];  Service: Orthopedics;  Laterality: Right;     Current Meds  Medication Sig   apixaban (ELIQUIS) 2.5 MG TABS tablet Take 1 tablet (2.5 mg total) by mouth 2 (two) times daily.   atorvastatin (LIPITOR) 20 MG tablet TAKE 1 TABLET BY MOUTH EVERY DAY   blood glucose meter kit and supplies KIT Dispense based on patient and insurance preference. Use up to four times daily as directed. (FOR ICD-9 250.00, 250.01).   cetirizine (ZYRTEC) 10 MG tablet Take 10 mg by mouth 2 (two) times daily. Pt takes twice a day   cyanocobalamin (,VITAMIN B-12,) 1000 MCG/ML injection Inject into the muscle.   dapagliflozin propanediol (FARXIGA) 10 MG TABS tablet Take by mouth daily.   docusate sodium (COLACE) 100 MG capsule Take 100 mg by mouth 2 (two) times daily.   FLUoxetine (PROZAC) 20 MG capsule TAKE 1 CAPSULE BY MOUTH EVERY DAY   metFORMIN (GLUCOPHAGE-XR) 500 MG 24 hr tablet TAKE 1 TABLET BY MOUTH EVERY DAY   NONFORMULARY OR COMPOUNDED ITEM Iron patch apply every 8 hours   olmesartan (BENICAR) 40 MG tablet Take 40 mg by mouth daily.   RYBELSUS 14 MG TABS TAKE 1 TABLET BY MOUTH EVERY DAY   Vitamin D, Ergocalciferol, (DRISDOL) 1.25 MG (50000 UNIT) CAPS capsule TAKE ONE CAPSULE BY MOUTH TWICE WEEKLY ON TUES/FRIDAYS   [DISCONTINUED]  hydrochlorothiazide (MICROZIDE) 12.5 MG capsule Take by mouth.     Allergies:   Amoxicillin and Fish allergy   Social History   Tobacco Use   Smoking status: Never   Smokeless tobacco: Never  Vaping Use   Vaping Use: Never used  Substance Use Topics   Alcohol use: Not Currently    Comment: OCCASIONAL    Drug use: No     Family Hx: The patient's family history includes COPD in her father; Congestive Heart Failure in her father; Diabetes in her father, mother, and sister; Hypertension in her father, mother, and sister; Stroke in her father.  ROS:   Please see the history of present illness.    Review of Systems  Constitutional: Negative.   HENT: Negative.    Eyes:  Negative for blurred vision.  Respiratory:  Negative for shortness of breath.   Cardiovascular:  Negative for chest pain, palpitations and orthopnea.  Genitourinary: Negative.   Neurological: Negative.  Negative for dizziness and headaches.  Endo/Heme/Allergies: Negative.   Psychiatric/Behavioral: Negative.      All other systems reviewed and are  negative.   Labs/Other Tests and Data Reviewed:    Recent Labs: 09/14/2021: ALT 15; BUN 24; Creatinine, Ser 1.28; Hemoglobin 11.4; Platelets 319; Potassium 4.4; Sodium 141   Recent Lipid Panel Lab Results  Component Value Date/Time   CHOL 151 10/26/2020 12:45 PM   TRIG 147 10/26/2020 12:45 PM   HDL 44 10/26/2020 12:45 PM   CHOLHDL 3.4 10/26/2020 12:45 PM   CHOLHDL 3.3 02/13/2018 11:16 AM   LDLCALC 81 10/26/2020 12:45 PM    Wt Readings from Last 3 Encounters:  09/15/21 270 lb (122.5 kg)  06/21/21 277 lb 6.4 oz (125.8 kg)  03/17/21 276 lb 4.8 oz (125.3 kg)     Exam:    Vital Signs:  BP 118/74   Ht 5' 4" (1.626 m)   LMP 01/18/2021 (Approximate)   BMI 46.35 kg/m     Physical Exam Vitals reviewed.  Constitutional:      General: She is not in acute distress.    Appearance: Normal appearance. She is obese.  Cardiovascular:     Rate and Rhythm: Normal  rate and regular rhythm.     Pulses: Normal pulses.     Heart sounds: Normal heart sounds. No murmur heard. Pulmonary:     Effort: Pulmonary effort is normal. No respiratory distress.     Breath sounds: Normal breath sounds. No wheezing.  Skin:    General: Skin is warm and dry.     Capillary Refill: Capillary refill takes less than 2 seconds.  Neurological:     General: No focal deficit present.     Mental Status: She is alert and oriented to person, place, and time. Mental status is at baseline.     Cranial Nerves: No cranial nerve deficit.  Psychiatric:        Mood and Affect: Mood and affect normal.        Behavior: Behavior normal.        Thought Content: Thought content normal.        Cognition and Memory: Memory normal.        Judgment: Judgment normal.     ASSESSMENT & PLAN:    1. Pulmonary hypertension, unspecified ([place]) Continue current medications and stay well hydrated with water - hydrochlorothiazide (MICROZIDE) 12.5 MG capsule; Take 1 capsule (12.5 mg total) by mouth 2 (two) times daily.  Dispense: 180 capsule; Refill: 1  2. Type 2 diabetes mellitus without complication, without long-term current use of insulin (HCC) Chronic, stable Continue with current medications Encouraged to limit intake of sugary foods and drinks Encouraged to increase physical activity to 150 minutes per week Suggestion is made for her to avoid simple carbohydrates from her diet including Cakes, Sweet Desserts, Ice Cream, Soda (diet and regular), Sweet Tea, Candies, Chips, Cookies, Store Bought Juices, Alcohol in Excess of 1-2 drinks a day, Artificial Sweeteners, Coffee Creamer, and "Sugar-free" Products. This will help patient to have more stable blood glucose  - Hemoglobin A1c; Future - BMP8+eGFR; Future  COVID-19 Education: The signs and symptoms of COVID-19 were discussed with the patient and how to seek care for testing (follow up with PCP or arrange E-visit).  The importance of social  distancing was discussed today.  Patient Risk:   After full review of this patients clinical status, I feel that they are at least moderate risk at this time.  Time:   Today, I have spent 12.50 minutes/ seconds with the patient with telehealth technology discussing above diagnoses.     Medication Adjustments/Labs and Tests  Ordered: Current medicines are reviewed at length with the patient today.  Concerns regarding medicines are outlined above.   Tests Ordered: Orders Placed This Encounter  Procedures   Hemoglobin A1c   BMP8+eGFR    Medication Changes: Meds ordered this encounter  Medications   hydrochlorothiazide (MICROZIDE) 12.5 MG capsule    Sig: Take 1 capsule (12.5 mg total) by mouth 2 (two) times daily.    Dispense:  180 capsule    Refill:  1    Disposition:  Follow up prn  Signed, [name], FNP

## 2021-12-28 NOTE — Patient Instructions (Signed)
Hypertension, Adult ?Hypertension is another name for high blood pressure. High blood pressure forces your heart to work harder to pump blood. This can cause problems over time. ?There are two numbers in a blood pressure reading. There is a top number (systolic) over a bottom number (diastolic). It is best to have a blood pressure that is below 120/80. ?What are the causes? ?The cause of this condition is not known. Some other conditions can lead to high blood pressure. ?What increases the risk? ?Some lifestyle factors can make you more likely to develop high blood pressure: ?Smoking. ?Not getting enough exercise or physical activity. ?Being overweight. ?Having too much fat, sugar, calories, or salt (sodium) in your diet. ?Drinking too much alcohol. ?Other risk factors include: ?Having any of these conditions: ?Heart disease. ?Diabetes. ?High cholesterol. ?Kidney disease. ?Obstructive sleep apnea. ?Having a family history of high blood pressure and high cholesterol. ?Age. The risk increases with age. ?Stress. ?What are the signs or symptoms? ?High blood pressure may not cause symptoms. Very high blood pressure (hypertensive crisis) may cause: ?Headache. ?Fast or uneven heartbeats (palpitations). ?Shortness of breath. ?Nosebleed. ?Vomiting or feeling like you may vomit (nauseous). ?Changes in how you see. ?Very bad chest pain. ?Feeling dizzy. ?Seizures. ?How is this treated? ?This condition is treated by making healthy lifestyle changes, such as: ?Eating healthy foods. ?Exercising more. ?Drinking less alcohol. ?Your doctor may prescribe medicine if lifestyle changes do not help enough and if: ?Your top number is above 130. ?Your bottom number is above 80. ?Your personal target blood pressure may vary. ?Follow these instructions at home: ?Eating and drinking ? ?If told, follow the DASH eating plan. To follow this plan: ?Fill one half of your plate at each meal with fruits and vegetables. ?Fill one fourth of your plate  at each meal with whole grains. Whole grains include whole-wheat pasta, brown rice, and whole-grain bread. ?Eat or drink low-fat dairy products, such as skim milk or low-fat yogurt. ?Fill one fourth of your plate at each meal with low-fat (lean) proteins. Low-fat proteins include fish, chicken without skin, eggs, beans, and tofu. ?Avoid fatty meat, cured and processed meat, or chicken with skin. ?Avoid pre-made or processed food. ?Limit the amount of salt in your diet to less than 1,500 mg each day. ?Do not drink alcohol if: ?Your doctor tells you not to drink. ?You are pregnant, may be pregnant, or are planning to become pregnant. ?If you drink alcohol: ?Limit how much you have to: ?0-1 drink a day for women. ?0-2 drinks a day for men. ?Know how much alcohol is in your drink. In the [place] one drink equals one 12 oz bottle of beer (355 mL), one 5 oz glass of wine (148 mL), or one 1? oz glass of hard liquor (44 mL). ?Lifestyle ? ?Work with your doctor to stay at a healthy weight or to lose weight. Ask your doctor what the best weight is for you. ?Get at least 30 minutes of exercise that causes your heart to beat faster (aerobic exercise) most days of the week. This may include walking, swimming, or biking. ?Get at least 30 minutes of exercise that strengthens your muscles (resistance exercise) at least 3 days a week. This may include lifting weights or doing Pilates. ?Do not smoke or use any products that contain nicotine or tobacco. If you need help quitting, ask your doctor. ?Check your blood pressure at home as told by your doctor. ?Keep all follow-up visits. ?Medicines ?Take over-the-counter and prescription medicines   only as told by your doctor. Follow directions carefully. ?Do not skip doses of blood pressure medicine. The medicine does not work as well if you skip doses. Skipping doses also puts you at risk for problems. ?Ask your doctor about side effects or reactions to medicines that you should watch  for. ?Contact a doctor if: ?You think you are having a reaction to the medicine you are taking. ?You have headaches that keep coming back. ?You feel dizzy. ?You have swelling in your ankles. ?You have trouble with your vision. ?Get help right away if: ?You get a very bad headache. ?You start to feel mixed up (confused). ?You feel weak or numb. ?You feel faint. ?You have very bad pain in your: ?Chest. ?Belly (abdomen). ?You vomit more than once. ?You have trouble breathing. ?These symptoms may be an emergency. Get help right away. Call 911. ?Do not wait to see if the symptoms will go away. ?Do not drive yourself to the hospital. ?Summary ?Hypertension is another name for high blood pressure. ?High blood pressure forces your heart to work harder to pump blood. ?For most people, a normal blood pressure is less than 120/80. ?Making healthy choices can help lower blood pressure. If your blood pressure does not get lower with healthy choices, you may need to take medicine. ?This information is not intended to replace advice given to you by your health care provider. Make sure you discuss any questions you have with your health care provider. ?Document Revised: 11/19/2020 Document Reviewed: 11/19/2020 ?Elsevier Patient Education ? 2023 [employer] ? ?

## 2022-01-19 ENCOUNTER — Inpatient Hospital Stay: Payer: BC Managed Care – PPO | Attending: Oncology

## 2022-01-19 VITALS — BP 86/61 | HR 84 | Resp 17

## 2022-01-19 DIAGNOSIS — E538 Deficiency of other specified B group vitamins: Secondary | ICD-10-CM | POA: Diagnosis present

## 2022-01-19 DIAGNOSIS — D500 Iron deficiency anemia secondary to blood loss (chronic): Secondary | ICD-10-CM

## 2022-01-19 DIAGNOSIS — D509 Iron deficiency anemia, unspecified: Secondary | ICD-10-CM | POA: Insufficient documentation

## 2022-01-19 DIAGNOSIS — Z79899 Other long term (current) drug therapy: Secondary | ICD-10-CM | POA: Insufficient documentation

## 2022-01-19 DIAGNOSIS — I2699 Other pulmonary embolism without acute cor pulmonale: Secondary | ICD-10-CM | POA: Insufficient documentation

## 2022-01-19 MED ORDER — CYANOCOBALAMIN 1000 MCG/ML IJ SOLN
1000.0000 ug | Freq: Once | INTRAMUSCULAR | Status: AC
  Administered 2022-01-19: 1000 ug via INTRAMUSCULAR

## 2022-02-13 ENCOUNTER — Other Ambulatory Visit: Payer: Self-pay | Admitting: Internal Medicine

## 2022-02-16 ENCOUNTER — Inpatient Hospital Stay: Payer: BC Managed Care – PPO | Attending: Oncology

## 2022-02-16 DIAGNOSIS — I2699 Other pulmonary embolism without acute cor pulmonale: Secondary | ICD-10-CM | POA: Diagnosis present

## 2022-02-16 DIAGNOSIS — D509 Iron deficiency anemia, unspecified: Secondary | ICD-10-CM | POA: Insufficient documentation

## 2022-02-16 DIAGNOSIS — D500 Iron deficiency anemia secondary to blood loss (chronic): Secondary | ICD-10-CM

## 2022-02-16 DIAGNOSIS — E538 Deficiency of other specified B group vitamins: Secondary | ICD-10-CM | POA: Diagnosis present

## 2022-02-16 DIAGNOSIS — Z79899 Other long term (current) drug therapy: Secondary | ICD-10-CM | POA: Insufficient documentation

## 2022-02-16 MED ORDER — CYANOCOBALAMIN 1000 MCG/ML IJ SOLN
1000.0000 ug | Freq: Once | INTRAMUSCULAR | Status: AC
  Administered 2022-02-16: 1000 ug via INTRAMUSCULAR
  Filled 2022-02-16: qty 1

## 2022-02-18 ENCOUNTER — Inpatient Hospital Stay: Payer: BC Managed Care – PPO

## 2022-02-21 ENCOUNTER — Other Ambulatory Visit: Payer: Self-pay | Admitting: Internal Medicine

## 2022-02-21 NOTE — Telephone Encounter (Signed)
Refill PROZAC.

## 2022-02-27 ENCOUNTER — Other Ambulatory Visit: Payer: Self-pay | Admitting: Internal Medicine

## 2022-03-03 ENCOUNTER — Encounter: Payer: Self-pay | Admitting: Nurse Practitioner

## 2022-03-03 ENCOUNTER — Ambulatory Visit (INDEPENDENT_AMBULATORY_CARE_PROVIDER_SITE_OTHER): Payer: BC Managed Care – PPO | Admitting: Nurse Practitioner

## 2022-03-03 VITALS — BP 130/64 | HR 95 | Temp 98.3°F | Ht 64.0 in | Wt 268.0 lb

## 2022-03-03 DIAGNOSIS — E1169 Type 2 diabetes mellitus with other specified complication: Secondary | ICD-10-CM | POA: Diagnosis not present

## 2022-03-03 DIAGNOSIS — E1159 Type 2 diabetes mellitus with other circulatory complications: Secondary | ICD-10-CM

## 2022-03-03 DIAGNOSIS — Z23 Encounter for immunization: Secondary | ICD-10-CM

## 2022-03-03 DIAGNOSIS — I152 Hypertension secondary to endocrine disorders: Secondary | ICD-10-CM

## 2022-03-03 DIAGNOSIS — I272 Pulmonary hypertension, unspecified: Secondary | ICD-10-CM

## 2022-03-03 DIAGNOSIS — Z6841 Body Mass Index (BMI) 40.0 and over, adult: Secondary | ICD-10-CM

## 2022-03-03 DIAGNOSIS — I10 Essential (primary) hypertension: Secondary | ICD-10-CM

## 2022-03-03 DIAGNOSIS — Z0000 Encounter for general adult medical examination without abnormal findings: Secondary | ICD-10-CM | POA: Diagnosis not present

## 2022-03-03 DIAGNOSIS — Z79899 Other long term (current) drug therapy: Secondary | ICD-10-CM

## 2022-03-03 MED ORDER — METFORMIN HCL ER 500 MG PO TB24: 500.0000 mg | ORAL_TABLET | Freq: Every day | ORAL | 1 refills | Status: DC

## 2022-03-03 NOTE — Progress Notes (Signed)
I,Kristen [name],acting as a Education administrator for [employer], FNP.,have documented all relevant documentation on the behalf of Kristen Brine, FNP,as directed by  Kristen Brine, FNP while in the presence of Kristen [name], [place].   Subjective:     Patient ID: Kristen [name] , female    DOB: 1965/09/04 , 57 y.o.   MRN: [id number]   Chief Complaint  Patient presents with   Annual Exam    HPI  Patient is here for physical exam. She is followed by Dr Edilia Bo at [facility].  She has been to the eye doctor in the last year she has one or two cataracts. She continues to take eliquis likely for lifetime. She is followed by Dr. Tasia Catchings. She is getting B12 injections.   Wt Readings from Last 3 Encounters: 03/03/22 : 268 lb (121.6 kg) 09/15/21 : 270 lb (122.5 kg) 06/21/21 : 277 lb 6.4 oz (125.8 kg)  She was wearing her boots when she weighed today. LMP - last wednesday   Diabetes She presents for her follow-up diabetic visit. She has type 2 diabetes mellitus. Her disease course has been stable. There are no hypoglycemic associated symptoms. Pertinent negatives for hypoglycemia include no dizziness or headaches. Pertinent negatives for diabetes include no chest pain. There are no hypoglycemic complications. Symptoms are stable. There are no diabetic complications. Risk factors for coronary artery disease include obesity and sedentary lifestyle. She is compliant with treatment all of the time. She is following a generally healthy diet. She has had a previous visit with a dietitian. She rarely participates in exercise. She does not see a podiatrist.Eye exam is current.  Hypertension This is a chronic problem. The current episode started more than 1 year ago. The problem has been gradually improving since onset. The problem is controlled. Pertinent negatives include no anxiety, chest pain, headaches or palpitations. There are no known risk factors for coronary artery disease. Past treatments include nothing. The  current treatment provides no improvement. There are no compliance problems.  There is no history of angina. There is no history of chronic renal disease.     Past Medical History:  Diagnosis Date   Alopecia    Arthritis    B12 deficiency    Diabetes ([place])    Type 2   Hypertension    Iron deficiency anemia 03/07/2018   Non-STEMI (non-ST elevated myocardial infarction) ([place]) 2020   Pulmonary embolism ([facility])    URI (upper respiratory infection)    took antibiotic and prednisone oct 2019 resolved   Uses contact lenses      Family History  Problem Relation Age of Onset   Hypertension Mother    Diabetes Mother    Hypertension Father    Diabetes Father    COPD Father    Stroke Father    Congestive Heart Failure Father    Diabetes Sister    Hypertension Sister      Current Outpatient Medications:    acetaminophen (TYLENOL) 500 MG tablet, Take 1 tablet (500 mg total) by mouth every 6 (six) hours as needed., Disp: 30 tablet, Rfl: 0   albuterol (PROVENTIL HFA;VENTOLIN HFA) 108 (90 Base) MCG/ACT inhaler, Inhale 1-2 puffs into the lungs every 6 (six) hours as needed for wheezing or shortness of breath., Disp: 1 Inhaler, Rfl: 2   apixaban (ELIQUIS) 2.5 MG TABS tablet, Take 1 tablet (2.5 mg total) by mouth 2 (two) times daily., Disp: 180 tablet, Rfl: 1   atorvastatin (LIPITOR) 20 MG tablet, TAKE 1 TABLET BY  MOUTH EVERY DAY, Disp: 90 tablet, Rfl: 3   blood glucose meter kit and supplies KIT, Dispense based on patient and insurance preference. Use up to four times daily as directed. (FOR ICD-9 250.00, 250.01)., Disp: 1 each, Rfl: 0   cetirizine (ZYRTEC) 10 MG tablet, Take 10 mg by mouth 2 (two) times daily. Pt takes twice a day, Disp: , Rfl:    cyanocobalamin (,VITAMIN B-12,) 1000 MCG/ML injection, Inject into the muscle., Disp: , Rfl:    dapagliflozin propanediol (FARXIGA) 10 MG TABS tablet, Take by mouth daily., Disp: , Rfl:    docusate sodium (COLACE) 100 MG capsule, Take 100 mg by mouth  2 (two) times daily., Disp: , Rfl:    FLUoxetine (PROZAC) 20 MG capsule, TAKE 1 CAPSULE BY MOUTH EVERY DAY, Disp: 90 capsule, Rfl: 1   hydrochlorothiazide (MICROZIDE) 12.5 MG capsule, Take 1 capsule (12.5 mg total) by mouth 2 (two) times daily., Disp: 180 capsule, Rfl: 1   NONFORMULARY OR COMPOUNDED ITEM, Iron patch apply every 8 hours, Disp: , Rfl:    olmesartan (BENICAR) 40 MG tablet, Take 40 mg by mouth daily., Disp: , Rfl:    RYBELSUS 14 MG TABS, TAKE 1 TABLET BY MOUTH EVERY DAY, Disp: 90 tablet, Rfl: 1   Vitamin D, Ergocalciferol, (DRISDOL) 1.25 MG (50000 UNIT) CAPS capsule, TAKE ONE CAPSULE BY MOUTH TWICE WEEKLY ON TUES/FRIDAYS, Disp: 24 capsule, Rfl: 1   metFORMIN (GLUCOPHAGE-XR) 500 MG 24 hr tablet, Take 1 tablet (500 mg total) by mouth daily., Disp: 90 tablet, Rfl: 1   Allergies  Allergen Reactions   Amoxicillin Hives    Has patient had a PCN reaction causing immediate rash, facial/tongue/throat swelling, SOB or lightheadedness with hypotension: No Has patient had a PCN reaction causing severe rash involving mucus membranes or skin necrosis: Yes Has patient had a PCN reaction that required hospitalization No Has patient had a PCN reaction occurring within the last 10 years: No If all of the above answers are "NO", then may proceed with Cephalosporin use.    Fish Allergy Hives      The patient states she uses post menopausal status for birth control. Patient's last menstrual period was 01/18/2021 (approximate).. Negative for Dysmenorrhea and Negative for Menorrhagia. Negative for: breast discharge, breast lump(s), breast pain and breast self exam. Associated symptoms include abnormal vaginal bleeding. Pertinent negatives include abnormal bleeding (hematology), anxiety, decreased libido, depression, difficulty falling sleep, dyspareunia, history of infertility, nocturia, sexual dysfunction, sleep disturbances, urinary incontinence, urinary urgency, vaginal discharge and vaginal itching.  Diet regular - overall good, she does have a bad. She is more on her diet plan. She has not been back to the Bariatric provider. The patient states her exercise level is minimal. She is having challenges with taking from a thought to an action. She would like to learn how to swim.   The patient's tobacco use is:  Social History   Tobacco Use  Smoking Status Never  Smokeless Tobacco Never  . She has been exposed to passive smoke. The patient's alcohol use is:  Social History   Substance and Sexual Activity  Alcohol Use Not Currently   Comment: OCCASIONAL   . Additional information: Last pap 2020, next one scheduled for 2023.    Review of Systems  Constitutional: Negative.   HENT: Negative.    Eyes: Negative.   Respiratory: Negative.    Cardiovascular: Negative.  Negative for chest pain and palpitations.  Gastrointestinal: Negative.   Endocrine: Negative.   Genitourinary: Negative.  Musculoskeletal: Negative.   Skin: Negative.   Allergic/Immunologic: Negative.   Neurological: Negative.  Negative for dizziness and headaches.  Hematological: Negative.   Psychiatric/Behavioral: Negative.       Today's Vitals   03/03/22 1438  BP: 130/64  Pulse: 95  Temp: 98.3 F (36.8 C)  TempSrc: Oral  Weight: 268 lb (121.6 kg)  Height: '5\' 4"'$  (1.626 m)   Body mass index is 46 kg/m.   Objective:  Physical Exam Vitals reviewed.  Constitutional:      General: She is not in acute distress.    Appearance: Normal appearance. She is well-developed. She is obese.  HENT:     Head: Normocephalic and atraumatic.     Right Ear: Hearing, tympanic membrane, ear canal and external ear normal. There is no impacted cerumen.     Left Ear: Hearing, tympanic membrane, ear canal and external ear normal. There is no impacted cerumen.     Nose:     Comments: Deferred - masked    Mouth/Throat:     Comments: Deferred - masked Eyes:     General: Lids are normal.     Extraocular Movements: Extraocular  movements intact.     Conjunctiva/sclera: Conjunctivae normal.     Pupils: Pupils are equal, round, and reactive to light.     Funduscopic exam:    Right eye: No papilledema.        Left eye: No papilledema.  Neck:     Thyroid: No thyroid mass.     Vascular: No carotid bruit.  Cardiovascular:     Rate and Rhythm: Normal rate and regular rhythm.     Pulses: Normal pulses.     Heart sounds: Normal heart sounds. No murmur heard. Pulmonary:     Effort: Pulmonary effort is normal. No respiratory distress.     Breath sounds: Normal breath sounds. No wheezing.  Chest:     Chest wall: No mass.  Breasts:    Tanner Score is 5.     Right: Normal. No mass or tenderness.     Left: Normal. No mass or tenderness.  Abdominal:     General: Abdomen is flat. Bowel sounds are normal. There is no distension.     Palpations: Abdomen is soft.     Tenderness: There is no abdominal tenderness.  Genitourinary:    Comments: Deferred Musculoskeletal:        General: No swelling. Normal range of motion.     Cervical back: Full passive range of motion without pain, normal range of motion and neck supple.     Right lower leg: No edema.     Left lower leg: No edema.  Lymphadenopathy:     Upper Body:     Right upper body: No supraclavicular, axillary or pectoral adenopathy.     Left upper body: No supraclavicular, axillary or pectoral adenopathy.  Skin:    General: Skin is warm and dry.     Capillary Refill: Capillary refill takes less than 2 seconds.     Comments: She does have alopecia  Neurological:     General: No focal deficit present.     Mental Status: She is alert and oriented to person, place, and time.     Cranial Nerves: No cranial nerve deficit.     Sensory: No sensory deficit.     Motor: No weakness.  Psychiatric:        Mood and Affect: Mood normal.        Behavior: Behavior normal.  Thought Content: Thought content normal.        Judgment: Judgment normal.          Assessment And Plan:     1. Health maintenance examination  2. Essential hypertension - POCT Urinalysis Dipstick (81002) - Microalbumin / Creatinine Urine Ratio - EKG 12-Lead - CMP14+EGFR  3. Obesity, diabetes, and hypertension syndrome ([place]) Comments: Weight is stable.  Continue current medications for diabetes and hypertension. Diabetic foot exam done - Hemoglobin A1c - Lipid panel - CMP14+EGFR  4. Class 3 severe obesity due to excess calories without serious comorbidity with body mass index (BMI) of 45.0 to 49.9 in adult [facility]) Chronic Discussed healthy diet and regular exercise options  Encouraged to exercise at least 150 minutes per week with 2 days of strength training  5. Need for influenza vaccination Influenza vaccine administered Encouraged to take Tylenol as needed for fever or muscle aches. - Flu Vaccine QUAD 6+ mos PF IM (Fluarix Quad PF)  6. Other long term (current) drug therapy - TSH   Patient was given opportunity to ask questions. Patient verbalized understanding of the plan and was able to repeat key elements of the plan. All questions were answered to their satisfaction.   Kristen Brine, FNP   I, Kristen Brine, FNP, have reviewed all documentation for this visit. The documentation on 03/03/22 for the exam, diagnosis, procedures, and orders are all accurate and complete.   THE PATIENT IS ENCOURAGED TO PRACTICE SOCIAL DISTANCING DUE TO THE COVID-19 PANDEMIC.

## 2022-03-03 NOTE — Patient Instructions (Signed)

## 2022-03-04 LAB — CMP14+EGFR
ALT: 10 IU/L (ref 0–32)
AST: 21 IU/L (ref 0–40)
Albumin/Globulin Ratio: 1.2 (ref 1.2–2.2)
Albumin: 4.2 g/dL (ref 3.8–4.9)
Alkaline Phosphatase: 76 IU/L (ref 44–121)
BUN/Creatinine Ratio: 20 (ref 9–23)
BUN: 27 mg/dL — ABNORMAL HIGH (ref 6–24)
Bilirubin Total: 0.4 mg/dL (ref 0.0–1.2)
CO2: 22 mmol/L (ref 20–29)
Calcium: 9.5 mg/dL (ref 8.7–10.2)
Chloride: 105 mmol/L (ref 96–106)
Creatinine, Ser: 1.33 mg/dL — ABNORMAL HIGH (ref 0.57–1.00)
Globulin, Total: 3.4 g/dL (ref 1.5–4.5)
Glucose: 80 mg/dL (ref 70–99)
Potassium: 5.2 mmol/L (ref 3.5–5.2)
Sodium: 141 mmol/L (ref 134–144)
Total Protein: 7.6 g/dL (ref 6.0–8.5)
eGFR: 47 mL/min/{1.73_m2} — ABNORMAL LOW (ref >59)

## 2022-03-04 LAB — HEMOGLOBIN A1C
Est. average glucose Bld gHb Est-mCnc: 137 mg/dL
Hgb A1c MFr Bld: 6.4 % — ABNORMAL HIGH (ref 4.8–5.6)

## 2022-03-04 LAB — MICROALBUMIN / CREATININE URINE RATIO
Creatinine, Urine: 182.8 mg/dL
Microalb/Creat Ratio: 6 mg/g creat (ref 0–29)
Microalbumin, Urine: 11.2 ug/mL

## 2022-03-04 LAB — TSH: TSH: 1.51 u[IU]/mL (ref 0.450–4.500)

## 2022-03-04 LAB — LIPID PANEL
Chol/HDL Ratio: 2.9 ratio (ref 0.0–4.4)
Cholesterol, Total: 138 mg/dL (ref 100–199)
HDL: 48 mg/dL (ref >39)
LDL Chol Calc (NIH): 69 mg/dL (ref 0–99)
Triglycerides: 116 mg/dL (ref 0–149)
VLDL Cholesterol Cal: 21 mg/dL (ref 5–40)

## 2022-03-22 ENCOUNTER — Inpatient Hospital Stay: Payer: BC Managed Care – PPO | Attending: Oncology

## 2022-03-22 DIAGNOSIS — I129 Hypertensive chronic kidney disease with stage 1 through stage 4 chronic kidney disease, or unspecified chronic kidney disease: Secondary | ICD-10-CM | POA: Insufficient documentation

## 2022-03-22 DIAGNOSIS — Z79899 Other long term (current) drug therapy: Secondary | ICD-10-CM | POA: Diagnosis not present

## 2022-03-22 DIAGNOSIS — D509 Iron deficiency anemia, unspecified: Secondary | ICD-10-CM | POA: Insufficient documentation

## 2022-03-22 DIAGNOSIS — N183 Chronic kidney disease, stage 3 unspecified: Secondary | ICD-10-CM | POA: Insufficient documentation

## 2022-03-22 DIAGNOSIS — Z823 Family history of stroke: Secondary | ICD-10-CM | POA: Diagnosis not present

## 2022-03-22 DIAGNOSIS — Z880 Allergy status to penicillin: Secondary | ICD-10-CM | POA: Diagnosis not present

## 2022-03-22 DIAGNOSIS — I252 Old myocardial infarction: Secondary | ICD-10-CM | POA: Insufficient documentation

## 2022-03-22 DIAGNOSIS — Z825 Family history of asthma and other chronic lower respiratory diseases: Secondary | ICD-10-CM | POA: Insufficient documentation

## 2022-03-22 DIAGNOSIS — Z833 Family history of diabetes mellitus: Secondary | ICD-10-CM | POA: Insufficient documentation

## 2022-03-22 DIAGNOSIS — E538 Deficiency of other specified B group vitamins: Secondary | ICD-10-CM | POA: Diagnosis present

## 2022-03-22 DIAGNOSIS — E1122 Type 2 diabetes mellitus with diabetic chronic kidney disease: Secondary | ICD-10-CM | POA: Insufficient documentation

## 2022-03-22 DIAGNOSIS — Z86711 Personal history of pulmonary embolism: Secondary | ICD-10-CM | POA: Diagnosis present

## 2022-03-22 DIAGNOSIS — Z7901 Long term (current) use of anticoagulants: Secondary | ICD-10-CM | POA: Diagnosis not present

## 2022-03-22 DIAGNOSIS — Z9884 Bariatric surgery status: Secondary | ICD-10-CM | POA: Insufficient documentation

## 2022-03-22 DIAGNOSIS — Z8249 Family history of ischemic heart disease and other diseases of the circulatory system: Secondary | ICD-10-CM | POA: Diagnosis not present

## 2022-03-22 LAB — COMPREHENSIVE METABOLIC PANEL
ALT: 13 U/L (ref 0–44)
AST: 27 U/L (ref 15–41)
Albumin: 3.8 g/dL (ref 3.5–5.0)
Alkaline Phosphatase: 60 U/L (ref 38–126)
Anion gap: 10 (ref 5–15)
BUN: 23 mg/dL — ABNORMAL HIGH (ref 6–20)
CO2: 24 mmol/L (ref 22–32)
Calcium: 9 mg/dL (ref 8.9–10.3)
Chloride: 105 mmol/L (ref 98–111)
Creatinine, Ser: 1.25 mg/dL — ABNORMAL HIGH (ref 0.44–1.00)
GFR, Estimated: 51 mL/min — ABNORMAL LOW (ref >60)
Glucose, Bld: 148 mg/dL — ABNORMAL HIGH (ref 70–99)
Potassium: 4.2 mmol/L (ref 3.5–5.1)
Sodium: 139 mmol/L (ref 135–145)
Total Bilirubin: 0.7 mg/dL (ref 0.3–1.2)
Total Protein: 7.6 g/dL (ref 6.5–8.1)

## 2022-03-22 LAB — VITAMIN B12: Vitamin B-12: 255 pg/mL (ref 180–914)

## 2022-03-22 LAB — CBC WITH DIFFERENTIAL/PLATELET
Abs Immature Granulocytes: 0.01 10*3/uL (ref 0.00–0.07)
Basophils Absolute: 0 10*3/uL (ref 0.0–0.1)
Basophils Relative: 0 %
Eosinophils Absolute: 0.1 10*3/uL (ref 0.0–0.5)
Eosinophils Relative: 1 %
HCT: 34.8 % — ABNORMAL LOW (ref 36.0–46.0)
Hemoglobin: 11.1 g/dL — ABNORMAL LOW (ref 12.0–15.0)
Immature Granulocytes: 0 %
Lymphocytes Relative: 42 %
Lymphs Abs: 2.3 10*3/uL (ref 0.7–4.0)
MCH: 29.3 pg (ref 26.0–34.0)
MCHC: 31.9 g/dL (ref 30.0–36.0)
MCV: 91.8 fL (ref 80.0–100.0)
Monocytes Absolute: 0.4 10*3/uL (ref 0.1–1.0)
Monocytes Relative: 7 %
Neutro Abs: 2.7 10*3/uL (ref 1.7–7.7)
Neutrophils Relative %: 50 %
Platelets: 319 10*3/uL (ref 150–400)
RBC: 3.79 MIL/uL — ABNORMAL LOW (ref 3.87–5.11)
RDW: 14.5 % (ref 11.5–15.5)
WBC: 5.4 10*3/uL (ref 4.0–10.5)
nRBC: 0 % (ref 0.0–0.2)

## 2022-03-22 LAB — IRON AND TIBC
Iron: 86 ug/dL (ref 28–170)
Saturation Ratios: 28 % (ref 10.4–31.8)
TIBC: 307 ug/dL (ref 250–450)
UIBC: 221 ug/dL

## 2022-03-22 LAB — FERRITIN: Ferritin: 79 ng/mL (ref 11–307)

## 2022-03-23 ENCOUNTER — Encounter: Payer: Self-pay | Admitting: Oncology

## 2022-03-23 ENCOUNTER — Inpatient Hospital Stay: Payer: BC Managed Care – PPO

## 2022-03-23 ENCOUNTER — Inpatient Hospital Stay (HOSPITAL_BASED_OUTPATIENT_CLINIC_OR_DEPARTMENT_OTHER): Payer: BC Managed Care – PPO | Admitting: Oncology

## 2022-03-23 VITALS — BP 113/57 | HR 84 | Temp 97.7°F | Resp 18 | Wt 270.6 lb

## 2022-03-23 VITALS — BP 122/67 | HR 79 | Resp 18

## 2022-03-23 DIAGNOSIS — N1831 Chronic kidney disease, stage 3a: Secondary | ICD-10-CM

## 2022-03-23 DIAGNOSIS — D500 Iron deficiency anemia secondary to blood loss (chronic): Secondary | ICD-10-CM

## 2022-03-23 DIAGNOSIS — N183 Chronic kidney disease, stage 3 unspecified: Secondary | ICD-10-CM | POA: Insufficient documentation

## 2022-03-23 DIAGNOSIS — E538 Deficiency of other specified B group vitamins: Secondary | ICD-10-CM

## 2022-03-23 DIAGNOSIS — Z86711 Personal history of pulmonary embolism: Secondary | ICD-10-CM

## 2022-03-23 MED ORDER — IRON SUCROSE 20 MG/ML IV SOLN: 200.0000 mg | Freq: Once | INTRAVENOUS | Status: DC

## 2022-03-23 MED ORDER — CYANOCOBALAMIN 1000 MCG/ML IJ SOLN
1000.0000 ug | Freq: Once | INTRAMUSCULAR | Status: AC
  Administered 2022-03-23: 1000 ug via INTRAMUSCULAR
  Filled 2022-03-23: qty 1

## 2022-03-23 MED ORDER — APIXABAN 2.5 MG PO TABS: 2.5000 mg | ORAL_TABLET | Freq: Two times a day (BID) | ORAL | 1 refills | Status: DC

## 2022-03-23 MED ORDER — SODIUM CHLORIDE 0.9 % IV SOLN
200.0000 mg | Freq: Once | INTRAVENOUS | Status: AC
  Administered 2022-03-23: 200 mg via INTRAVENOUS
  Filled 2022-03-23: qty 200

## 2022-03-23 MED ORDER — SODIUM CHLORIDE 0.9 % IV SOLN
Freq: Once | INTRAVENOUS | Status: AC
  Filled 2022-03-23: qty 250

## 2022-03-23 NOTE — Assessment & Plan Note (Signed)
History of bariatric surgery/gastric sleeve Labs are reviewed and discussed with patient. IV  Venofer '200mg'$  x 1 for maintenance

## 2022-03-23 NOTE — Assessment & Plan Note (Signed)
Continue monthly parenteral vitamin b12 1022mg injections

## 2022-03-23 NOTE — Progress Notes (Signed)
Pt here for follow up. No new concerns voiced.   

## 2022-03-23 NOTE — Assessment & Plan Note (Signed)
Encourage oral hydration and avoid nephrotoxins.   

## 2022-03-23 NOTE — Progress Notes (Signed)
Hematology/Oncology Progress note [phone] [phone]    ASSESSMENT & PLAN:   History of pulmonary embolism continue Eliquis 2.'5mg'$  BID.  Refilled Rx  Iron deficiency anemia History of bariatric surgery/gastric sleeve Labs are reviewed and discussed with patient. IV  Venofer '200mg'$  x 1 for maintenance  Vitamin B12 deficiency  Continue monthly parenteral vitamin b12 1025mg injections  CKD (chronic kidney disease) stage 3, GFR 30-59 ml/min (HCC) Encourage oral hydration and avoid nephrotoxins.    Orders Placed This Encounter  Procedures   CBC with Differential/Platelet    Standing Status:   Future    Standing Expiration Date:   03/24/2023   Comprehensive metabolic panel    Standing Status:   Future    Standing Expiration Date:   03/23/2023   Ferritin    Standing Status:   Future    Standing Expiration Date:   03/24/2023   Iron and TIBC    Standing Status:   Future    Standing Expiration Date:   03/24/2023   Vitamin B12    Standing Status:   Future    Standing Expiration Date:   03/24/2023   Follow up in 6 months All questions were answered. The patient knows to call the clinic with any problems, questions or concerns.  Kristen Server MD, PhD [facility] Oncology 03/23/2022   REASON FOR VISIT  follow-up for management of pulmonary embolism  PERTINENT HEMATOLOGY HISTORY Kristen [name] a 57y.o.afemale who has above oncology history reviewed by me today presented for follow up visit for management of pulmonary embolism.  Patient was seen by me during hospitalization.. Patient presents to ER for evaluation of syncope episode. Patient fainted but did not fall or hurt herself as family member caught patient.  EKG in ED showed ST changed. Troponin elevated and started on heparin drip and admitted.  On day 1 of admission, CODE BLUE was called and patient was unresponsive and received brief chest compression. Patient woke up and talked.  No  cardiac arrest.  She reports feeling dizzy and lightheaded and passed out.  Similar to the syncope episode prior to admission. Cardiac catheterization showed normal LV systolic function and hyperdynamic in nature with ejection fraction of 70% consistent with hyper trophic cardiomyopathy.  Significantly elevated pulmonary pressure possibly consistent with pulmonary embolism.  No cardiac intervention due to normal coronary arteries. Patient had VQ scan on 02/15/2018 which showed hypermobility for pulmonary embolism Patient was on heparin drip and was transitioned to Eliquis 10 mg twice daily started on 02/16/2018.  # PE finished 6 months of Eliquis '5mg'$  BID.  # History of gastric sleeve  INTERVAL HISTORY Kristen [name] a 57y.o. y.o.female who has above oncology history reviewed by me today presented for follow up visit for management of pulmonary embolism, iron deficiency, vitamin B12 deficiency.  Patient reports feeling well.  She takes Eliquis 2.5 mg twice daily. Denies bleeding events.  Patient is on monthly B12 injections.     . Review of Systems  Constitutional:  Negative for appetite change, chills, fatigue and fever.  HENT:   Negative for hearing loss and voice change.   Eyes:  Negative for eye problems.  Respiratory:  Negative for chest tightness, cough and shortness of breath.   Cardiovascular:  Negative for chest pain.  Gastrointestinal:  Negative for abdominal distention, abdominal pain and blood in stool.  Endocrine: Negative for hot flashes.  Genitourinary:  Negative for difficulty urinating and frequency.   Musculoskeletal:  Negative for arthralgias.  Skin:  Negative for itching and rash.  Neurological:  Negative for extremity weakness.  Hematological:  Negative for adenopathy.  Psychiatric/Behavioral:  Negative for confusion.       Allergies  Allergen Reactions   Amoxicillin Hives    Has patient had a PCN reaction causing immediate rash, facial/tongue/throat  swelling, SOB or lightheadedness with hypotension: No Has patient had a PCN reaction causing severe rash involving mucus membranes or skin necrosis: Yes Has patient had a PCN reaction that required hospitalization No Has patient had a PCN reaction occurring within the last 10 years: No If all of the above answers are "NO", then may proceed with Cephalosporin use.    Fish Allergy Hives     Past Medical History:  Diagnosis Date   Alopecia    Arthritis    B12 deficiency    Diabetes ([place])    Type 2   Hypertension    Iron deficiency anemia 03/07/2018   Non-STEMI (non-ST elevated myocardial infarction) ([place]) 2020   Pulmonary embolism (HCC)    URI (upper respiratory infection)    took antibiotic and prednisone oct 2019 resolved   Uses contact lenses      Past Surgical History:  Procedure Laterality Date   BREAST BIOPSY Right 2015   COLONOSCOPY WITH PROPOFOL N/A 03/18/2016   Procedure: COLONOSCOPY WITH PROPOFOL;  Surgeon: Carol Ada, MD;  Location: [facility];  Service: Endoscopy;  Laterality: N/A;   LAPAROSCOPIC GASTRIC SLEEVE RESECTION N/A 01/29/2018   Procedure: LAPAROSCOPIC GASTRIC SLEEVE RESECTION WITH UPPER ENDO AND ERAS PATHWAY;  Surgeon: Kinsinger, Arta Bruce, MD;  Location: [facility];  Service: General;  Laterality: N/A;   RIGHT/LEFT HEART CATH AND CORONARY ANGIOGRAPHY N/A 02/15/2018   Procedure: RIGHT/LEFT HEART CATH AND CORONARY ANGIOGRAPHY;  Surgeon: Corey Skains, MD;  Location: [place] [facility];  Service: Cardiovascular;  Laterality: N/A;   TOTAL HIP ARTHROPLASTY Right 08/04/2017   Procedure: RIGHT TOTAL HIP ARTHROPLASTY ANTERIOR APPROACH;  Surgeon: Mcarthur Rossetti, MD;  Location: [facility];  Service: Orthopedics;  Laterality: Right;    Social History   Socioeconomic History   Marital status: Married    Spouse name: Not on file   Number of children: Not on file   Years of education: Not on file   Highest education level: Not on file  Occupational History    Not on file  Tobacco Use   Smoking status: Never   Smokeless tobacco: Never  Vaping Use   Vaping Use: Never used  Substance and Sexual Activity   Alcohol use: Not Currently    Comment: OCCASIONAL    Drug use: No   Sexual activity: Yes    Birth control/protection: None  Other Topics Concern   Not on file  Social History Narrative   Not on file   Social Determinants of Health   Financial Resource Strain: Not on file  Food Insecurity: Not on file  Transportation Needs: Not on file  Physical Activity: Not on file  Stress: Not on file  Social Connections: Not on file  Intimate Partner Violence: Not on file    Family History  Problem Relation Age of Onset   Hypertension Mother    Diabetes Mother    Hypertension Father    Diabetes Father    COPD Father    Stroke Father    Congestive Heart Failure Father    Diabetes Sister    Hypertension Sister      Current Outpatient Medications:  acetaminophen (TYLENOL) 500 MG tablet, Take 1 tablet (500 mg total) by mouth every 6 (six) hours as needed., Disp: 30 tablet, Rfl: 0   albuterol (PROVENTIL HFA;VENTOLIN HFA) 108 (90 Base) MCG/ACT inhaler, Inhale 1-2 puffs into the lungs every 6 (six) hours as needed for wheezing or shortness of breath., Disp: 1 Inhaler, Rfl: 2   atorvastatin (LIPITOR) 20 MG tablet, TAKE 1 TABLET BY MOUTH EVERY DAY, Disp: 90 tablet, Rfl: 3   blood glucose meter kit and supplies KIT, Dispense based on patient and insurance preference. Use up to four times daily as directed. (FOR ICD-9 250.00, 250.01)., Disp: 1 each, Rfl: 0   cetirizine (ZYRTEC) 10 MG tablet, Take 10 mg by mouth 2 (two) times daily. Pt takes twice a day, Disp: , Rfl:    cyanocobalamin (,VITAMIN B-12,) 1000 MCG/ML injection, Inject into the muscle., Disp: , Rfl:    docusate sodium (COLACE) 100 MG capsule, Take 100 mg by mouth 2 (two) times daily., Disp: , Rfl:    FLUoxetine (PROZAC) 20 MG capsule, TAKE 1 CAPSULE BY MOUTH EVERY DAY, Disp: 90  capsule, Rfl: 1   hydrochlorothiazide (MICROZIDE) 12.5 MG capsule, Take 1 capsule (12.5 mg total) by mouth 2 (two) times daily., Disp: 180 capsule, Rfl: 1   metFORMIN (GLUCOPHAGE-XR) 500 MG 24 hr tablet, Take 1 tablet (500 mg total) by mouth daily., Disp: 90 tablet, Rfl: 1   NONFORMULARY OR COMPOUNDED ITEM, Iron patch apply every 8 hours, Disp: , Rfl:    olmesartan (BENICAR) 40 MG tablet, Take 40 mg by mouth daily., Disp: , Rfl:    RYBELSUS 14 MG TABS, TAKE 1 TABLET BY MOUTH EVERY DAY, Disp: 90 tablet, Rfl: 1   Vitamin D, Ergocalciferol, (DRISDOL) 1.25 MG (50000 UNIT) CAPS capsule, TAKE ONE CAPSULE BY MOUTH TWICE WEEKLY ON TUES/FRIDAYS, Disp: 24 capsule, Rfl: 1   apixaban (ELIQUIS) 2.5 MG TABS tablet, Take 1 tablet (2.5 mg total) by mouth 2 (two) times daily., Disp: 180 tablet, Rfl: 1   dapagliflozin propanediol (FARXIGA) 10 MG TABS tablet, Take by mouth daily. (Patient not taking: Reported on 03/23/2022), Disp: , Rfl:   Physical exam:  Vitals:   03/23/22 1436  BP: (!) 113/57  Pulse: 84  Resp: 18  Temp: 97.7 F (36.5 C)  SpO2: 98%  Weight: 270 lb 9.6 oz (122.7 kg)   Physical Exam Constitutional:      General: She is not in acute distress.    Comments: Morbidly obese.   HENT:     Head: Normocephalic and atraumatic.     Comments: Chronic alopecia Eyes:     General: No scleral icterus.    Pupils: Pupils are equal, round, and reactive to light.  Cardiovascular:     Rate and Rhythm: Normal rate.  Pulmonary:     Effort: Pulmonary effort is normal. No respiratory distress.     Breath sounds: No wheezing.  Abdominal:     General: There is no distension.     Palpations: Abdomen is soft.  Musculoskeletal:        General: No deformity. Normal range of motion.     Cervical back: Normal range of motion and neck supple.  Skin:    General: Skin is warm and dry.     Findings: No erythema or rash.  Neurological:     Mental Status: She is alert and oriented to person, place, and time.  Mental status is at baseline.     Cranial Nerves: No cranial nerve deficit.  Psychiatric:  Mood and Affect: Mood normal.    LABORATORY STUDIES    Latest Ref Rng & Units 03/22/2022    2:31 PM 09/14/2021    1:57 PM 03/15/2021   12:42 PM  CBC  WBC 4.0 - 10.5 K/uL 5.4  5.9  5.3   Hemoglobin 12.0 - 15.0 g/dL 11.1  11.4  11.7   Hematocrit 36.0 - 46.0 % 34.8  35.6  37.8   Platelets 150 - 400 K/uL 319  319  328       Latest Ref Rng & Units 03/22/2022    2:31 PM 03/03/2022    3:43 PM 09/14/2021    1:57 PM  CMP  Glucose 70 - 99 mg/dL 148  80  100   BUN 6 - 20 mg/dL '23  27  24   '$ Creatinine 0.44 - 1.00 mg/dL 1.25  1.33  1.28   Sodium 135 - 145 mmol/L 139  141  141   Potassium 3.5 - 5.1 mmol/L 4.2  5.2  4.4   Chloride 98 - 111 mmol/L 105  105  106   CO2 22 - 32 mmol/L '24  22  27   '$ Calcium 8.9 - 10.3 mg/dL 9.0  9.5  9.2   Total Protein 6.5 - 8.1 g/dL 7.6  7.6  7.9   Total Bilirubin 0.3 - 1.2 mg/dL 0.7  0.4  0.4   Alkaline Phos 38 - 126 U/L 60  76  58   AST 15 - 41 U/L '27  21  26   '$ ALT 0 - 44 U/L '13  10  15    '$ Iron/TIBC/Ferritin/ %Sat    Component Value Date/Time   IRON 86 03/22/2022 1431   TIBC 307 03/22/2022 1431   FERRITIN 79 03/22/2022 1431   IRONPCTSAT 28 03/22/2022 1431      RADIOGRAPHIC STUDIES: I have personally reviewed the radiological images as listed and agreed with the findings in the report. No results found.

## 2022-03-23 NOTE — Assessment & Plan Note (Signed)
continue Eliquis 2.'5mg'$  BID.  Refilled Rx

## 2022-03-30 ENCOUNTER — Telehealth: Payer: Self-pay

## 2022-03-30 NOTE — Telephone Encounter (Signed)
Received letter from [facility] indicating Rybelsus will requre PA after 05/16/22. Letter indicated PA could be started now.  PA for Rebylsus started via [facility] Key#  [id number]

## 2022-04-04 ENCOUNTER — Other Ambulatory Visit: Payer: Self-pay

## 2022-04-04 NOTE — Telephone Encounter (Signed)
PA for Rybelsus 14MG tablets has been DENIED. No further information provided by CoverMyMeds.

## 2022-04-04 NOTE — Telephone Encounter (Signed)
New PA started via [facility] Rybelsus 14MG tablets Key: [id number]   APPROVED Effective from 04/04/2022 through 04/03/2023.[name] Authorization Expiration Date: April 03, 2023

## 2022-04-20 ENCOUNTER — Inpatient Hospital Stay: Payer: BC Managed Care – PPO | Attending: Oncology

## 2022-04-21 ENCOUNTER — Inpatient Hospital Stay: Payer: BC Managed Care – PPO

## 2022-04-28 ENCOUNTER — Inpatient Hospital Stay: Payer: BC Managed Care – PPO

## 2022-05-05 ENCOUNTER — Inpatient Hospital Stay: Payer: BC Managed Care – PPO

## 2022-05-12 ENCOUNTER — Inpatient Hospital Stay: Payer: BC Managed Care – PPO

## 2022-05-18 ENCOUNTER — Other Ambulatory Visit: Payer: Self-pay | Admitting: *Deleted

## 2022-05-18 MED ORDER — APIXABAN 2.5 MG PO TABS: 2.5000 mg | ORAL_TABLET | Freq: Two times a day (BID) | ORAL | 1 refills | Status: DC

## 2022-05-18 NOTE — Telephone Encounter (Signed)
Patient called and said that insurance has changed her pharmacy to [facility] at [name] and she needs new prescription for 90 days of her Eliquis sent to them

## 2022-05-19 ENCOUNTER — Inpatient Hospital Stay: Payer: BC Managed Care – PPO

## 2022-05-23 ENCOUNTER — Inpatient Hospital Stay: Payer: BC Managed Care – PPO

## 2022-05-25 ENCOUNTER — Inpatient Hospital Stay: Payer: BC Managed Care – PPO | Attending: Oncology

## 2022-06-20 ENCOUNTER — Other Ambulatory Visit: Payer: Self-pay | Admitting: Internal Medicine

## 2022-06-22 ENCOUNTER — Inpatient Hospital Stay: Payer: BC Managed Care – PPO | Attending: Oncology

## 2022-06-22 DIAGNOSIS — D509 Iron deficiency anemia, unspecified: Secondary | ICD-10-CM | POA: Diagnosis present

## 2022-06-22 DIAGNOSIS — E538 Deficiency of other specified B group vitamins: Secondary | ICD-10-CM | POA: Diagnosis present

## 2022-06-22 DIAGNOSIS — Z79899 Other long term (current) drug therapy: Secondary | ICD-10-CM | POA: Insufficient documentation

## 2022-06-22 DIAGNOSIS — Z86711 Personal history of pulmonary embolism: Secondary | ICD-10-CM | POA: Diagnosis present

## 2022-06-22 DIAGNOSIS — D500 Iron deficiency anemia secondary to blood loss (chronic): Secondary | ICD-10-CM

## 2022-06-22 MED ORDER — CYANOCOBALAMIN 1000 MCG/ML IJ SOLN
1000.0000 ug | Freq: Once | INTRAMUSCULAR | Status: AC
  Administered 2022-06-22: 1000 ug via INTRAMUSCULAR
  Filled 2022-06-22: qty 1

## 2022-07-04 ENCOUNTER — Encounter: Payer: Self-pay | Admitting: Internal Medicine

## 2022-07-04 ENCOUNTER — Other Ambulatory Visit: Payer: Self-pay | Admitting: Internal Medicine

## 2022-07-04 ENCOUNTER — Ambulatory Visit (INDEPENDENT_AMBULATORY_CARE_PROVIDER_SITE_OTHER): Payer: BC Managed Care – PPO | Admitting: Internal Medicine

## 2022-07-04 VITALS — BP 124/76 | HR 76 | Temp 98.2°F | Ht 64.0 in | Wt 267.6 lb

## 2022-07-04 DIAGNOSIS — I10 Essential (primary) hypertension: Secondary | ICD-10-CM | POA: Diagnosis not present

## 2022-07-04 DIAGNOSIS — E785 Hyperlipidemia, unspecified: Secondary | ICD-10-CM | POA: Diagnosis not present

## 2022-07-04 DIAGNOSIS — I272 Pulmonary hypertension, unspecified: Secondary | ICD-10-CM

## 2022-07-04 DIAGNOSIS — E1169 Type 2 diabetes mellitus with other specified complication: Secondary | ICD-10-CM

## 2022-07-04 DIAGNOSIS — Z6841 Body Mass Index (BMI) 40.0 and over, adult: Secondary | ICD-10-CM

## 2022-07-04 MED ORDER — DAPAGLIFLOZIN PROPANEDIOL 10 MG PO TABS: 10.0000 mg | ORAL_TABLET | Freq: Every day | ORAL | 1 refills | Status: DC

## 2022-07-04 MED ORDER — RYBELSUS 14 MG PO TABS: 1.0000 | ORAL_TABLET | Freq: Every day | ORAL | 1 refills | Status: DC

## 2022-07-04 NOTE — Progress Notes (Signed)
Jeri Cos [place],acting as a Neurosurgeon for Gwynneth Aliment, MD.,have documented all relevant documentation on the behalf of Gwynneth Aliment, MD,as directed by  Gwynneth Aliment, MD while in the presence of Gwynneth Aliment, MD.    Subjective:     Patient ID: Kristen [name] , female    DOB: 08-03-1965 , 57 y.o.   MRN: [id number]   Chief Complaint  Patient presents with   Hypertension    HPI  The patient is here today for a follow-up on her diabetes and  hypertension.  She reports compliance with meds. She admits she is not exercising as much as she should. She has had an eventful year dealing with her husband, he recently received a kidney transplant!  Wt Readings from Last 3 Encounters: 07/04/22 : 267 lb 9.6 oz (121.4 kg) 03/23/22 : 270 lb 9.6 oz (122.7 kg) 03/03/22 : 268 lb (121.6 kg)    Hypertension This is a chronic problem. The current episode started more than 1 year ago. The problem has been gradually improving since onset. The problem is controlled. Pertinent negatives include no blurred vision, chest pain, headaches, orthopnea, palpitations or shortness of breath. Risk factors for coronary artery disease include diabetes mellitus, dyslipidemia, obesity and sedentary lifestyle. Past treatments include diuretics and angiotensin blockers.  Diabetes She presents for her follow-up diabetic visit. She has type 2 diabetes mellitus. There are no hypoglycemic associated symptoms. Pertinent negatives for hypoglycemia include no dizziness or headaches. Pertinent negatives for diabetes include no blurred vision, no chest pain, no polydipsia and no polyphagia. There are no hypoglycemic complications. Symptoms are stable. There are no diabetic complications. Risk factors for coronary artery disease include obesity and sedentary lifestyle. She is compliant with treatment all of the time.     Past Medical History:  Diagnosis Date   Alopecia    Arthritis    B12 deficiency    Diabetes  (HCC)    Type 2   Hypertension    Iron deficiency anemia 03/07/2018   Non-STEMI (non-ST elevated myocardial infarction) (HCC) 2020   Pulmonary embolism (HCC)    URI (upper respiratory infection)    took antibiotic and prednisone oct 2019 resolved   Uses contact lenses      Family History  Problem Relation Age of Onset   Hypertension Mother    Diabetes Mother    Hypertension Father    Diabetes Father    COPD Father    Stroke Father    Congestive Heart Failure Father    Diabetes Sister    Hypertension Sister      Current Outpatient Medications:    acetaminophen (TYLENOL) 500 MG tablet, Take 1 tablet (500 mg total) by mouth every 6 (six) hours as needed., Disp: 30 tablet, Rfl: 0   albuterol (PROVENTIL HFA;VENTOLIN HFA) 108 (90 Base) MCG/ACT inhaler, Inhale 1-2 puffs into the lungs every 6 (six) hours as needed for wheezing or shortness of breath., Disp: 1 Inhaler, Rfl: 2   apixaban (ELIQUIS) 2.5 MG TABS tablet, Take 1 tablet (2.5 mg total) by mouth 2 (two) times daily., Disp: 180 tablet, Rfl: 1   atorvastatin (LIPITOR) 20 MG tablet, TAKE 1 TABLET BY MOUTH EVERY DAY, Disp: 90 tablet, Rfl: 3   blood glucose meter kit and supplies KIT, Dispense based on patient and insurance preference. Use up to four times daily as directed. (FOR ICD-9 250.00, 250.01)., Disp: 1 each, Rfl: 0   cetirizine (ZYRTEC) 10 MG tablet, Take 10 mg by mouth 2 (  two) times daily. Pt takes twice a day, Disp: , Rfl:    cyanocobalamin (,VITAMIN B-12,) 1000 MCG/ML injection, Inject into the muscle., Disp: , Rfl:    docusate sodium (COLACE) 100 MG capsule, Take 100 mg by mouth 2 (two) times daily., Disp: , Rfl:    FLUoxetine (PROZAC) 20 MG capsule, TAKE 1 CAPSULE BY MOUTH EVERY DAY, Disp: 90 capsule, Rfl: 1   hydrochlorothiazide (HYDRODIURIL) 25 MG tablet, Take 25 mg by mouth daily., Disp: , Rfl:    metFORMIN (GLUCOPHAGE-XR) 500 MG 24 hr tablet, Take 1 tablet (500 mg total) by mouth daily., Disp: 90 tablet, Rfl: 1    NONFORMULARY OR COMPOUNDED ITEM, Iron patch apply every 8 hours, Disp: , Rfl:    olmesartan (BENICAR) 40 MG tablet, Take 40 mg by mouth daily., Disp: , Rfl:    Vitamin D, Ergocalciferol, (DRISDOL) 1.25 MG (50000 UNIT) CAPS capsule, TAKE ONE CAPSULE BY MOUTH TWICE WEEKLY ON TUES/FRIDAYS, Disp: 24 capsule, Rfl: 1   dapagliflozin propanediol (FARXIGA) 10 MG TABS tablet, Take 1 tablet (10 mg total) by mouth daily., Disp: 90 tablet, Rfl: 1   Semaglutide (RYBELSUS) 14 MG TABS, Take 1 tablet (14 mg total) by mouth daily., Disp: 90 tablet, Rfl: 1   Allergies  Allergen Reactions   Amoxicillin Hives    Has patient had a PCN reaction causing immediate rash, facial/tongue/throat swelling, SOB or lightheadedness with hypotension: No Has patient had a PCN reaction causing severe rash involving mucus membranes or skin necrosis: Yes Has patient had a PCN reaction that required hospitalization No Has patient had a PCN reaction occurring within the last 10 years: No If all of the above answers are "NO", then may proceed with Cephalosporin use.    Fish Allergy Hives     Review of Systems  Constitutional: Negative.   Eyes: Negative.  Negative for blurred vision.  Respiratory: Negative.  Negative for shortness of breath.   Cardiovascular: Negative.  Negative for chest pain, palpitations and orthopnea.  Endocrine: Negative for polydipsia and polyphagia.  Musculoskeletal: Negative.   Skin: Negative.   Neurological:  Negative for dizziness and headaches.  Psychiatric/Behavioral: Negative.       Today's Vitals   07/04/22 1438  BP: 124/76  Pulse: 76  Temp: 98.2 F (36.8 C)  Weight: 267 lb 9.6 oz (121.4 kg)  Height: 5\' 4"  (1.626 m)  PainSc: 0-No pain   Body mass index is 45.93 kg/m.  Wt Readings from Last 3 Encounters:  07/04/22 267 lb 9.6 oz (121.4 kg)  03/23/22 270 lb 9.6 oz (122.7 kg)  03/03/22 268 lb (121.6 kg)    The ASCVD Risk score (Arnett DK, et al., 2019) failed to calculate for the  following reasons:   The patient has a prior MI or stroke diagnosis ++ Objective:  Physical Exam Vitals and nursing note reviewed.  Constitutional:      Appearance: Normal appearance. She is obese.  HENT:     Head: Normocephalic and atraumatic.  Eyes:     Extraocular Movements: Extraocular movements intact.  Cardiovascular:     Rate and Rhythm: Normal rate and regular rhythm.     Heart sounds: Normal heart sounds.  Pulmonary:     Effort: Pulmonary effort is normal.     Breath sounds: Normal breath sounds.  Musculoskeletal:     Cervical back: Normal range of motion.  Skin:    General: Skin is warm.  Neurological:     General: No focal deficit present.  Mental Status: She is alert.  Psychiatric:        Mood and Affect: Mood normal.        Behavior: Behavior normal.     Assessment And Plan:     1. Essential hypertension Comments: Chronic, fair control. Advised to continue with olmesartan 40mg  and HCTZ 25mg  daily.  Reminded to follow low sodium diet.  2. Dyslipidemia associated with type 2 diabetes mellitus (HCC) Comments: Chronic, I will check labs as below. LDL goal < 70. She will c/w atorvastatin and Rybelsus. I will resume Marcelline Deist, she agrees to get labwork in four weeks. - CMP14+EGFR - Hemoglobin A1c - BMP8+eGFR; Future  3. Pulmonary hypertension, unspecified (HCC) Comments: Noted in 2020, likely secondary to PE.  She agrees to repeat echocardiogram. - ECHOCARDIOGRAM COMPLETE; Future  4. Class 3 severe obesity due to excess calories without serious comorbidity with body mass index (BMI) of 45.0 to 49.9 in adult [facility]) Comments: BMI 45. She is encouraged to aim for at least 150 minutes of exercise/week. Advised to start walking after dinner, 15 minutes one way and then back home.    Return for 4 month DM.  Patient was given opportunity to ask questions. Patient verbalized understanding of the plan and was able to repeat key elements of the plan. All questions were  answered to their satisfaction.   I, Gwynneth Aliment, MD, have reviewed all documentation for this visit. The documentation on 07/04/22 for the exam, diagnosis, procedures, and orders are all accurate and complete.   IF YOU HAVE BEEN REFERRED TO A SPECIALIST, IT MAY TAKE 1-2 WEEKS TO SCHEDULE/PROCESS THE REFERRAL. IF YOU HAVE NOT HEARD FROM US/SPECIALIST IN TWO WEEKS, PLEASE GIVE [place] A CALL AT [phone] X [phone].   THE PATIENT IS ENCOURAGED TO PRACTICE SOCIAL DISTANCING DUE TO THE COVID-19 PANDEMIC.

## 2022-07-05 LAB — CMP14+EGFR
ALT: 13 IU/L (ref 0–32)
AST: 21 IU/L (ref 0–40)
Albumin/Globulin Ratio: 1.4 (ref 1.2–2.2)
Albumin: 4.2 g/dL (ref 3.8–4.9)
Alkaline Phosphatase: 75 IU/L (ref 44–121)
BUN/Creatinine Ratio: 17 (ref 9–23)
BUN: 20 mg/dL (ref 6–24)
Bilirubin Total: 0.5 mg/dL (ref 0.0–1.2)
CO2: 23 mmol/L (ref 20–29)
Calcium: 9.4 mg/dL (ref 8.7–10.2)
Chloride: 105 mmol/L (ref 96–106)
Creatinine, Ser: 1.17 mg/dL — ABNORMAL HIGH (ref 0.57–1.00)
Globulin, Total: 3 g/dL (ref 1.5–4.5)
Glucose: 94 mg/dL (ref 70–99)
Potassium: 4.9 mmol/L (ref 3.5–5.2)
Sodium: 143 mmol/L (ref 134–144)
Total Protein: 7.2 g/dL (ref 6.0–8.5)
eGFR: 55 mL/min/{1.73_m2} — ABNORMAL LOW (ref >59)

## 2022-07-05 LAB — HEMOGLOBIN A1C
Est. average glucose Bld gHb Est-mCnc: 128 mg/dL
Hgb A1c MFr Bld: 6.1 % — ABNORMAL HIGH (ref 4.8–5.6)

## 2022-07-08 ENCOUNTER — Other Ambulatory Visit: Payer: Self-pay

## 2022-07-08 MED ORDER — HYDROCHLOROTHIAZIDE 25 MG PO TABS: 25.0000 mg | ORAL_TABLET | Freq: Every day | ORAL | 2 refills | Status: DC

## 2022-07-25 ENCOUNTER — Inpatient Hospital Stay: Payer: BC Managed Care – PPO

## 2022-07-27 ENCOUNTER — Inpatient Hospital Stay: Payer: BC Managed Care – PPO | Attending: Oncology

## 2022-07-27 DIAGNOSIS — E538 Deficiency of other specified B group vitamins: Secondary | ICD-10-CM | POA: Diagnosis present

## 2022-07-27 DIAGNOSIS — D509 Iron deficiency anemia, unspecified: Secondary | ICD-10-CM | POA: Diagnosis present

## 2022-07-27 DIAGNOSIS — Z86711 Personal history of pulmonary embolism: Secondary | ICD-10-CM | POA: Diagnosis present

## 2022-07-27 DIAGNOSIS — D500 Iron deficiency anemia secondary to blood loss (chronic): Secondary | ICD-10-CM

## 2022-07-27 MED ORDER — CYANOCOBALAMIN 1000 MCG/ML IJ SOLN
1000.0000 ug | Freq: Once | INTRAMUSCULAR | Status: AC
  Administered 2022-07-27: 1000 ug via INTRAMUSCULAR
  Filled 2022-07-27: qty 1

## 2022-08-22 ENCOUNTER — Other Ambulatory Visit: Payer: Self-pay | Admitting: Internal Medicine

## 2022-08-24 ENCOUNTER — Inpatient Hospital Stay: Payer: BC Managed Care – PPO | Attending: Oncology

## 2022-08-24 DIAGNOSIS — Z86711 Personal history of pulmonary embolism: Secondary | ICD-10-CM | POA: Diagnosis present

## 2022-08-24 DIAGNOSIS — D509 Iron deficiency anemia, unspecified: Secondary | ICD-10-CM | POA: Diagnosis present

## 2022-08-24 DIAGNOSIS — Z79899 Other long term (current) drug therapy: Secondary | ICD-10-CM | POA: Diagnosis not present

## 2022-08-24 DIAGNOSIS — D500 Iron deficiency anemia secondary to blood loss (chronic): Secondary | ICD-10-CM

## 2022-08-24 DIAGNOSIS — E538 Deficiency of other specified B group vitamins: Secondary | ICD-10-CM | POA: Insufficient documentation

## 2022-08-24 MED ORDER — CYANOCOBALAMIN 1000 MCG/ML IJ SOLN
1000.0000 ug | Freq: Once | INTRAMUSCULAR | Status: AC
  Administered 2022-08-24: 1000 ug via INTRAMUSCULAR

## 2022-09-08 ENCOUNTER — Encounter (HOSPITAL_COMMUNITY): Payer: Self-pay | Admitting: *Deleted

## 2022-09-16 ENCOUNTER — Inpatient Hospital Stay: Payer: BC Managed Care – PPO | Attending: Oncology

## 2022-09-16 DIAGNOSIS — Z7984 Long term (current) use of oral hypoglycemic drugs: Secondary | ICD-10-CM | POA: Diagnosis not present

## 2022-09-16 DIAGNOSIS — E538 Deficiency of other specified B group vitamins: Secondary | ICD-10-CM | POA: Diagnosis present

## 2022-09-16 DIAGNOSIS — E1122 Type 2 diabetes mellitus with diabetic chronic kidney disease: Secondary | ICD-10-CM | POA: Diagnosis not present

## 2022-09-16 DIAGNOSIS — D500 Iron deficiency anemia secondary to blood loss (chronic): Secondary | ICD-10-CM

## 2022-09-16 DIAGNOSIS — Z7901 Long term (current) use of anticoagulants: Secondary | ICD-10-CM | POA: Diagnosis not present

## 2022-09-16 DIAGNOSIS — Z880 Allergy status to penicillin: Secondary | ICD-10-CM | POA: Insufficient documentation

## 2022-09-16 DIAGNOSIS — I252 Old myocardial infarction: Secondary | ICD-10-CM | POA: Diagnosis not present

## 2022-09-16 DIAGNOSIS — Z8249 Family history of ischemic heart disease and other diseases of the circulatory system: Secondary | ICD-10-CM | POA: Insufficient documentation

## 2022-09-16 DIAGNOSIS — Z833 Family history of diabetes mellitus: Secondary | ICD-10-CM | POA: Insufficient documentation

## 2022-09-16 DIAGNOSIS — I129 Hypertensive chronic kidney disease with stage 1 through stage 4 chronic kidney disease, or unspecified chronic kidney disease: Secondary | ICD-10-CM | POA: Insufficient documentation

## 2022-09-16 DIAGNOSIS — Z825 Family history of asthma and other chronic lower respiratory diseases: Secondary | ICD-10-CM | POA: Diagnosis not present

## 2022-09-16 DIAGNOSIS — Z9884 Bariatric surgery status: Secondary | ICD-10-CM | POA: Insufficient documentation

## 2022-09-16 DIAGNOSIS — D508 Other iron deficiency anemias: Secondary | ICD-10-CM | POA: Insufficient documentation

## 2022-09-16 DIAGNOSIS — Z86711 Personal history of pulmonary embolism: Secondary | ICD-10-CM | POA: Diagnosis present

## 2022-09-16 DIAGNOSIS — Z823 Family history of stroke: Secondary | ICD-10-CM | POA: Insufficient documentation

## 2022-09-16 DIAGNOSIS — R55 Syncope and collapse: Secondary | ICD-10-CM | POA: Diagnosis not present

## 2022-09-16 DIAGNOSIS — N183 Chronic kidney disease, stage 3 unspecified: Secondary | ICD-10-CM | POA: Diagnosis not present

## 2022-09-16 DIAGNOSIS — Z79899 Other long term (current) drug therapy: Secondary | ICD-10-CM | POA: Insufficient documentation

## 2022-09-16 LAB — COMPREHENSIVE METABOLIC PANEL
ALT: 14 U/L (ref 0–44)
AST: 23 U/L (ref 15–41)
Albumin: 3.7 g/dL (ref 3.5–5.0)
Alkaline Phosphatase: 57 U/L (ref 38–126)
Anion gap: 9 (ref 5–15)
BUN: 25 mg/dL — ABNORMAL HIGH (ref 6–20)
CO2: 22 mmol/L (ref 22–32)
Calcium: 8.9 mg/dL (ref 8.9–10.3)
Chloride: 107 mmol/L (ref 98–111)
Creatinine, Ser: 1.43 mg/dL — ABNORMAL HIGH (ref 0.44–1.00)
GFR, Estimated: 43 mL/min — ABNORMAL LOW (ref >60)
Glucose, Bld: 134 mg/dL — ABNORMAL HIGH (ref 70–99)
Potassium: 4.1 mmol/L (ref 3.5–5.1)
Sodium: 138 mmol/L (ref 135–145)
Total Bilirubin: 0.5 mg/dL (ref 0.3–1.2)
Total Protein: 7.6 g/dL (ref 6.5–8.1)

## 2022-09-16 LAB — CBC WITH DIFFERENTIAL/PLATELET
Abs Immature Granulocytes: 0 10*3/uL (ref 0.00–0.07)
Basophils Absolute: 0 10*3/uL (ref 0.0–0.1)
Basophils Relative: 0 %
Eosinophils Absolute: 0.1 10*3/uL (ref 0.0–0.5)
Eosinophils Relative: 1 %
HCT: 35.2 % — ABNORMAL LOW (ref 36.0–46.0)
Hemoglobin: 11.3 g/dL — ABNORMAL LOW (ref 12.0–15.0)
Immature Granulocytes: 0 %
Lymphocytes Relative: 41 %
Lymphs Abs: 2 10*3/uL (ref 0.7–4.0)
MCH: 29 pg (ref 26.0–34.0)
MCHC: 32.1 g/dL (ref 30.0–36.0)
MCV: 90.5 fL (ref 80.0–100.0)
Monocytes Absolute: 0.3 10*3/uL (ref 0.1–1.0)
Monocytes Relative: 6 %
Neutro Abs: 2.5 10*3/uL (ref 1.7–7.7)
Neutrophils Relative %: 52 %
Platelets: 347 10*3/uL (ref 150–400)
RBC: 3.89 MIL/uL (ref 3.87–5.11)
RDW: 14.6 % (ref 11.5–15.5)
WBC: 4.9 10*3/uL (ref 4.0–10.5)
nRBC: 0 % (ref 0.0–0.2)

## 2022-09-16 LAB — IRON AND TIBC
Iron: 79 ug/dL (ref 28–170)
Saturation Ratios: 25 % (ref 10.4–31.8)
TIBC: 322 ug/dL (ref 250–450)
UIBC: 243 ug/dL

## 2022-09-16 LAB — VITAMIN B12: Vitamin B-12: 447 pg/mL (ref 180–914)

## 2022-09-16 LAB — FERRITIN: Ferritin: 101 ng/mL (ref 11–307)

## 2022-09-17 ENCOUNTER — Other Ambulatory Visit: Payer: Self-pay | Admitting: Internal Medicine

## 2022-09-18 ENCOUNTER — Other Ambulatory Visit: Payer: Self-pay | Admitting: Internal Medicine

## 2022-09-20 ENCOUNTER — Encounter: Payer: Self-pay | Admitting: Oncology

## 2022-09-20 ENCOUNTER — Inpatient Hospital Stay: Payer: BC Managed Care – PPO

## 2022-09-20 ENCOUNTER — Inpatient Hospital Stay: Payer: BC Managed Care – PPO | Admitting: Oncology

## 2022-09-20 VITALS — BP 95/58 | HR 91 | Temp 97.2°F | Resp 18 | Wt 268.0 lb

## 2022-09-20 DIAGNOSIS — D500 Iron deficiency anemia secondary to blood loss (chronic): Secondary | ICD-10-CM

## 2022-09-20 DIAGNOSIS — D508 Other iron deficiency anemias: Secondary | ICD-10-CM

## 2022-09-20 DIAGNOSIS — Z86711 Personal history of pulmonary embolism: Secondary | ICD-10-CM | POA: Diagnosis not present

## 2022-09-20 DIAGNOSIS — N1831 Chronic kidney disease, stage 3a: Secondary | ICD-10-CM | POA: Diagnosis not present

## 2022-09-20 DIAGNOSIS — E538 Deficiency of other specified B group vitamins: Secondary | ICD-10-CM | POA: Diagnosis not present

## 2022-09-20 MED ORDER — APIXABAN 2.5 MG PO TABS: 2.5000 mg | ORAL_TABLET | Freq: Two times a day (BID) | ORAL | 1 refills | Status: DC

## 2022-09-20 MED ORDER — CYANOCOBALAMIN 1000 MCG/ML IJ SOLN
1000.0000 ug | Freq: Once | INTRAMUSCULAR | Status: AC
  Administered 2022-09-20: 1000 ug via INTRAMUSCULAR
  Filled 2022-09-20: qty 1

## 2022-09-20 NOTE — Progress Notes (Signed)
Hematology/Oncology Progress note [phone] [phone]    ASSESSMENT & PLAN:   History of pulmonary embolism continue Eliquis 2.5mg  BID.  Refilled Rx  Iron deficiency anemia History of bariatric surgery/gastric sleeve Labs are reviewed and discussed with patient. Lab Results  Component Value Date   HGB 11.3 (L) 09/16/2022   TIBC 322 09/16/2022   IRONPCTSAT 25 09/16/2022   FERRITIN 101 09/16/2022    Hemoglobin is stable.  I will hold off Venofer today.  Vitamin B12 deficiency  Continue monthly parenteral vitamin b12 injections  CKD (chronic kidney disease) stage 3, GFR 30-59 ml/min (HCC) Encourage oral hydration and avoid nephrotoxins.    Orders Placed This Encounter  Procedures   CBC with Differential (Cancer Center Only)    Standing Status:   Future    Standing Expiration Date:   09/20/2023   CMP (Cancer Center only)    Standing Status:   Future    Standing Expiration Date:   09/20/2023   Iron and TIBC    Standing Status:   Future    Standing Expiration Date:   09/20/2023   Ferritin    Standing Status:   Future    Standing Expiration Date:   09/20/2023   Vitamin B12    Standing Status:   Future    Standing Expiration Date:   09/20/2023   Follow up in 6 months All questions were answered. The patient knows to call the clinic with any problems, questions or concerns.  Rickard Patience, MD, PhD [facility] Hematology Oncology 09/20/2022   REASON FOR VISIT  follow-up for management of pulmonary embolism  PERTINENT HEMATOLOGY HISTORY Kristen [name] is a 57 y.o.afemale who has above oncology history reviewed by me today presented for follow up visit for management of pulmonary embolism.  Patient was seen by me during hospitalization.. Patient presents to ER for evaluation of syncope episode. Patient fainted but did not fall or hurt herself as family member caught patient.  EKG in ED showed ST changed. Troponin elevated and started on  heparin drip and admitted.  On day 1 of admission, CODE BLUE was called and patient was unresponsive and received brief chest compression. Patient woke up and talked.  No cardiac arrest.  She reports feeling dizzy and lightheaded and passed out.  Similar to the syncope episode prior to admission. Cardiac catheterization showed normal LV systolic function and hyperdynamic in nature with ejection fraction of 70% consistent with hyper trophic cardiomyopathy.  Significantly elevated pulmonary pressure possibly consistent with pulmonary embolism.  No cardiac intervention due to normal coronary arteries. Patient had VQ scan on 02/15/2018 which showed hypermobility for pulmonary embolism Patient was on heparin drip and was transitioned to Eliquis 10 mg twice daily started on 02/16/2018.  # PE finished 6 months of Eliquis 5mg  BID.  # History of gastric sleeve  INTERVAL HISTORY Kristen [name] is a 57 y.o. y.o.female who has above oncology history reviewed by me today presented for follow up visit for management of pulmonary embolism, iron deficiency, vitamin B12 deficiency.  Patient reports feeling well.  She takes Eliquis 2.5 mg twice daily. Denies bleeding events.  Patient is on monthly B12 injections.     . Review of Systems  Constitutional:  Negative for appetite change, chills, fatigue and fever.  HENT:   Negative for hearing loss and voice change.   Eyes:  Negative for eye problems.  Respiratory:  Negative for chest tightness, cough and shortness of breath.   Cardiovascular:  Negative for chest pain.  Gastrointestinal:  Negative for abdominal distention, abdominal pain and blood in stool.  Endocrine: Negative for hot flashes.  Genitourinary:  Negative for difficulty urinating and frequency.   Musculoskeletal:  Negative for arthralgias.  Skin:  Negative for itching and rash.  Neurological:  Negative for extremity weakness.  Hematological:  Negative for adenopathy.   Psychiatric/Behavioral:  Negative for confusion.       Allergies  Allergen Reactions   Amoxicillin Hives    Has patient had a PCN reaction causing immediate rash, facial/tongue/throat swelling, SOB or lightheadedness with hypotension: No Has patient had a PCN reaction causing severe rash involving mucus membranes or skin necrosis: Yes Has patient had a PCN reaction that required hospitalization No Has patient had a PCN reaction occurring within the last 10 years: No If all of the above answers are "NO", then may proceed with Cephalosporin use.    Fish Allergy Hives     Past Medical History:  Diagnosis Date   Alopecia    Arthritis    B12 deficiency    Diabetes (HCC)    Type 2   Hypertension    Iron deficiency anemia 03/07/2018   Non-STEMI (non-ST elevated myocardial infarction) (HCC) 2020   Pulmonary embolism (HCC)    URI (upper respiratory infection)    took antibiotic and prednisone oct 2019 resolved   Uses contact lenses      Past Surgical History:  Procedure Laterality Date   BREAST BIOPSY Right 2015   COLONOSCOPY WITH PROPOFOL N/A 03/18/2016   Procedure: COLONOSCOPY WITH PROPOFOL;  Surgeon: Jeani Hawking, MD;  Location: [facility];  Service: Endoscopy;  Laterality: N/A;   LAPAROSCOPIC GASTRIC SLEEVE RESECTION N/A 01/29/2018   Procedure: LAPAROSCOPIC GASTRIC SLEEVE RESECTION WITH UPPER ENDO AND ERAS PATHWAY;  Surgeon: Kinsinger, De Blanch, MD;  Location: [facility];  Service: General;  Laterality: N/A;   RIGHT/LEFT HEART CATH AND CORONARY ANGIOGRAPHY N/A 02/15/2018   Procedure: RIGHT/LEFT HEART CATH AND CORONARY ANGIOGRAPHY;  Surgeon: Lamar Blinks, MD;  Location: [facility];  Service: Cardiovascular;  Laterality: N/A;   TOTAL HIP ARTHROPLASTY Right 08/04/2017   Procedure: RIGHT TOTAL HIP ARTHROPLASTY ANTERIOR APPROACH;  Surgeon: Kathryne Hitch, MD;  Location: [facility];  Service: Orthopedics;  Laterality: Right;    Social History   Socioeconomic  History   Marital status: Married    Spouse name: Not on file   Number of children: Not on file   Years of education: Not on file   Highest education level: Master's degree (e.g., MA, MS, MEng, MEd, MSW, MBA)  Occupational History   Not on file  Tobacco Use   Smoking status: Never   Smokeless tobacco: Never  Vaping Use   Vaping status: Never Used  Substance and Sexual Activity   Alcohol use: Not Currently    Comment: OCCASIONAL    Drug use: No   Sexual activity: Yes    Birth control/protection: None  Other Topics Concern   Not on file  Social History Narrative   Not on file   Social Determinants of Health   Financial Resource Strain: Low Risk  (07/02/2022)   Overall Financial Resource Strain (CARDIA)    Difficulty of Paying Living Expenses: Not hard at all  Food Insecurity: No Food Insecurity (07/02/2022)   Hunger Vital Sign    Worried About Running Out of Food in the Last Year: Never true    Ran Out of Food in the Last Year: Never true  Transportation  Needs: No Transportation Needs (07/02/2022)   PRAPARE - Administrator, Civil Service (Medical): No    Lack of Transportation (Non-Medical): No  Physical Activity: Insufficiently Active (07/02/2022)   Exercise Vital Sign    Days of Exercise per Week: 2 days    Minutes of Exercise per Session: 10 min  Stress: No Stress Concern Present (07/02/2022)   [employer] of Occupational Health - Occupational Stress Questionnaire    Feeling of Stress : Only a little  Social Connections: Socially Integrated (07/02/2022)   Social Connection and Isolation Panel [NHANES]    Frequency of Communication with Friends and Family: More than three times a week    Frequency of Social Gatherings with Friends and Family: Twice a week    Attends Religious Services: More than 4 times per year    Active Member of [employer] or Organizations: Yes    Attends Engineer, structural: More than 4 times per year    Marital Status: Married   Catering manager Violence: Not on file    Family History  Problem Relation Age of Onset   Hypertension Mother    Diabetes Mother    Hypertension Father    Diabetes Father    COPD Father    Stroke Father    Congestive Heart Failure Father    Diabetes Sister    Hypertension Sister      Current Outpatient Medications:    acetaminophen (TYLENOL) 500 MG tablet, Take 1 tablet (500 mg total) by mouth every 6 (six) hours as needed., Disp: 30 tablet, Rfl: 0   albuterol (PROVENTIL HFA;VENTOLIN HFA) 108 (90 Base) MCG/ACT inhaler, Inhale 1-2 puffs into the lungs every 6 (six) hours as needed for wheezing or shortness of breath., Disp: 1 Inhaler, Rfl: 2   atorvastatin (LIPITOR) 20 MG tablet, TAKE 1 TABLET BY MOUTH EVERY DAY, Disp: 90 tablet, Rfl: 3   blood glucose meter kit and supplies KIT, Dispense based on patient and insurance preference. Use up to four times daily as directed. (FOR ICD-9 250.00, 250.01)., Disp: 1 each, Rfl: 0   cetirizine (ZYRTEC) 10 MG tablet, Take 10 mg by mouth 2 (two) times daily. Pt takes twice a day, Disp: , Rfl:    cyanocobalamin (,VITAMIN B-12,) 1000 MCG/ML injection, Inject into the muscle., Disp: , Rfl:    dapagliflozin propanediol (FARXIGA) 10 MG TABS tablet, Take 1 tablet (10 mg total) by mouth daily., Disp: 90 tablet, Rfl: 1   docusate sodium (COLACE) 100 MG capsule, Take 100 mg by mouth 2 (two) times daily., Disp: , Rfl:    FLUoxetine (PROZAC) 20 MG capsule, TAKE ONE CAPSULE BY MOUTH EVERY DAY, Disp: 90 capsule, Rfl: 2   hydrochlorothiazide (HYDRODIURIL) 25 MG tablet, Take 1 tablet (25 mg total) by mouth daily., Disp: 90 tablet, Rfl: 2   metFORMIN (GLUCOPHAGE-XR) 500 MG 24 hr tablet, Take 1 tablet (500 mg total) by mouth daily., Disp: 90 tablet, Rfl: 1   olmesartan (BENICAR) 40 MG tablet, Take 40 mg by mouth daily., Disp: , Rfl:    Semaglutide (RYBELSUS) 14 MG TABS, Take 1 tablet (14 mg total) by mouth daily., Disp: 90 tablet, Rfl: 1   Vitamin D,  Ergocalciferol, (DRISDOL) 1.25 MG (50000 UNIT) CAPS capsule, TAKE 1 CAPSULE BY MOUTH TWICE A WEEK ON TUE/FRI AS DIRECTED, Disp: 24 capsule, Rfl: 1   apixaban (ELIQUIS) 2.5 MG TABS tablet, Take 1 tablet (2.5 mg total) by mouth 2 (two) times daily., Disp: 180 tablet, Rfl: 1  Physical  exam:  Vitals:   09/20/22 1325  BP: (!) 95/58  Pulse: 91  Resp: 18  Temp: (!) 97.2 F (36.2 C)  Weight: 268 lb (121.6 kg)   Physical Exam Constitutional:      General: She is not in acute distress.    Comments: Morbidly obese.   HENT:     Head: Normocephalic and atraumatic.     Comments: Chronic alopecia Eyes:     General: No scleral icterus.    Pupils: Pupils are equal, round, and reactive to light.  Cardiovascular:     Rate and Rhythm: Normal rate.  Pulmonary:     Effort: Pulmonary effort is normal. No respiratory distress.     Breath sounds: No wheezing.  Abdominal:     General: There is no distension.     Palpations: Abdomen is soft.  Musculoskeletal:        General: No deformity. Normal range of motion.     Cervical back: Normal range of motion and neck supple.  Skin:    General: Skin is warm and dry.     Findings: No erythema or rash.  Neurological:     Mental Status: She is alert and oriented to person, place, and time. Mental status is at baseline.     Cranial Nerves: No cranial nerve deficit.  Psychiatric:        Mood and Affect: Mood normal.    LABORATORY STUDIES    Latest Ref Rng & Units 09/16/2022   11:27 AM 03/22/2022    2:31 PM 09/14/2021    1:57 PM  CBC  WBC 4.0 - 10.5 K/uL 4.9  5.4  5.9   Hemoglobin 12.0 - 15.0 g/dL 16.1  09.6  04.5   Hematocrit 36.0 - 46.0 % 35.2  34.8  35.6   Platelets 150 - 400 K/uL 347  319  319       Latest Ref Rng & Units 09/16/2022   11:27 AM 07/04/2022    4:04 PM 03/22/2022    2:31 PM  CMP  Glucose 70 - 99 mg/dL 409  94  811   BUN 6 - 20 mg/dL 25  20  23    Creatinine 0.44 - 1.00 mg/dL 9.14  7.82  9.56   Sodium 135 - 145 mmol/L 138  143  139    Potassium 3.5 - 5.1 mmol/L 4.1  4.9  4.2   Chloride 98 - 111 mmol/L 107  105  105   CO2 22 - 32 mmol/L 22  23  24    Calcium 8.9 - 10.3 mg/dL 8.9  9.4  9.0   Total Protein 6.5 - 8.1 g/dL 7.6  7.2  7.6   Total Bilirubin 0.3 - 1.2 mg/dL 0.5  0.5  0.7   Alkaline Phos 38 - 126 U/L 57  75  60   AST 15 - 41 U/L 23  21  27    ALT 0 - 44 U/L 14  13  13     Iron/TIBC/Ferritin/ %Sat    Component Value Date/Time   IRON 79 09/16/2022 1127   TIBC 322 09/16/2022 1127   FERRITIN 101 09/16/2022 1127   IRONPCTSAT 25 09/16/2022 1127      RADIOGRAPHIC STUDIES: I have personally reviewed the radiological images as listed and agreed with the findings in the report. No results found.

## 2022-09-20 NOTE — Assessment & Plan Note (Addendum)
History of bariatric surgery/gastric sleeve Labs are reviewed and discussed with patient. Lab Results  Component Value Date   HGB 11.3 (L) 09/16/2022   TIBC 322 09/16/2022   IRONPCTSAT 25 09/16/2022   FERRITIN 101 09/16/2022    Hemoglobin is stable.  I will hold off Venofer today.

## 2022-09-20 NOTE — Assessment & Plan Note (Signed)
Continue monthly parenteral vitamin b12 1022mg injections

## 2022-09-20 NOTE — Assessment & Plan Note (Signed)
Encourage oral hydration and avoid nephrotoxins.   

## 2022-09-20 NOTE — Assessment & Plan Note (Signed)
continue Eliquis 2.'5mg'$  BID.  Refilled Rx

## 2022-09-29 ENCOUNTER — Encounter: Payer: Self-pay | Admitting: Internal Medicine

## 2022-09-29 ENCOUNTER — Other Ambulatory Visit: Payer: Self-pay

## 2022-09-29 MED ORDER — METFORMIN HCL ER 500 MG PO TB24: 500.0000 mg | ORAL_TABLET | Freq: Every day | ORAL | 1 refills | Status: DC

## 2022-10-21 ENCOUNTER — Inpatient Hospital Stay: Payer: BC Managed Care – PPO | Attending: Oncology

## 2022-11-14 ENCOUNTER — Ambulatory Visit: Payer: BC Managed Care – PPO | Admitting: Internal Medicine

## 2022-11-21 ENCOUNTER — Inpatient Hospital Stay: Payer: BC Managed Care – PPO | Attending: Oncology

## 2022-12-04 ENCOUNTER — Other Ambulatory Visit: Payer: Self-pay | Admitting: Internal Medicine

## 2022-12-22 ENCOUNTER — Inpatient Hospital Stay: Payer: BC Managed Care – PPO | Attending: Oncology

## 2022-12-30 ENCOUNTER — Other Ambulatory Visit: Payer: Self-pay | Admitting: Internal Medicine

## 2023-01-10 ENCOUNTER — Ambulatory Visit: Payer: BC Managed Care – PPO | Admitting: Internal Medicine

## 2023-01-10 VITALS — BP 110/78 | HR 90 | Temp 97.8°F | Ht 64.0 in | Wt 264.2 lb

## 2023-01-10 DIAGNOSIS — I272 Pulmonary hypertension, unspecified: Secondary | ICD-10-CM

## 2023-01-10 DIAGNOSIS — Z23 Encounter for immunization: Secondary | ICD-10-CM | POA: Diagnosis not present

## 2023-01-10 DIAGNOSIS — E1169 Type 2 diabetes mellitus with other specified complication: Secondary | ICD-10-CM

## 2023-01-10 DIAGNOSIS — E538 Deficiency of other specified B group vitamins: Secondary | ICD-10-CM | POA: Diagnosis not present

## 2023-01-10 DIAGNOSIS — E66813 Obesity, class 3: Secondary | ICD-10-CM

## 2023-01-10 DIAGNOSIS — E785 Hyperlipidemia, unspecified: Secondary | ICD-10-CM | POA: Diagnosis not present

## 2023-01-10 DIAGNOSIS — Z6841 Body Mass Index (BMI) 40.0 and over, adult: Secondary | ICD-10-CM

## 2023-01-10 MED ORDER — CYANOCOBALAMIN 1000 MCG/ML IJ SOLN
1000.0000 ug | Freq: Once | INTRAMUSCULAR | Status: AC
Start: 2023-01-10 — End: 2023-01-10
  Administered 2023-01-10: 1000 ug via INTRAMUSCULAR

## 2023-01-10 NOTE — Progress Notes (Signed)
I,Victoria T Deloria Lair, CMA,acting as a Neurosurgeon for Gwynneth Aliment, MD.,have documented all relevant documentation on the behalf of Gwynneth Aliment, MD,as directed by  Gwynneth Aliment, MD while in the presence of Gwynneth Aliment, MD.  Subjective:  Patient ID: Kristen [name] , female    DOB: 09-Mar-1965 , 57 y.o.   MRN: [id number]  Chief Complaint  Patient presents with   Diabetes   Hypertension    HPI  Patient presents today for diabetes follow up. Denies headache, chest pain & sob. She has no specific questions or concerns. She reports compliance with medications. She is happy to report she had a great trip to [place] with her sister recently. She feels well. Has no new concerns at this time.   She does admit to being under a great deal of stress. She has been dealing with her husband who is a transplant recipient. He has had a fair number of complications. She is trying to do better about self care.   Diabetes She presents for her follow-up diabetic visit. She has type 2 diabetes mellitus. There are no hypoglycemic associated symptoms. Pertinent negatives for hypoglycemia include no dizziness or headaches. Pertinent negatives for diabetes include no blurred vision and no chest pain. There are no hypoglycemic complications. Symptoms are stable. There are no diabetic complications. Risk factors for coronary artery disease include obesity and sedentary lifestyle. She is compliant with treatment all of the time.  Hypertension This is a chronic problem. The current episode started more than 1 year ago. The problem has been gradually improving since onset. The problem is controlled. Pertinent negatives include no blurred vision, chest pain, headaches, orthopnea, palpitations or shortness of breath. Risk factors for coronary artery disease include diabetes mellitus, dyslipidemia, obesity and sedentary lifestyle. Past treatments include diuretics and angiotensin blockers.     Past Medical History:   Diagnosis Date   Alopecia    Arthritis    B12 deficiency    Diabetes (HCC)    Type 2   Hypertension    Iron deficiency anemia 03/07/2018   Non-STEMI (non-ST elevated myocardial infarction) (HCC) 2020   Pulmonary embolism (HCC)    URI (upper respiratory infection)    took antibiotic and prednisone oct 2019 resolved   Uses contact lenses      Family History  Problem Relation Age of Onset   Hypertension Mother    Diabetes Mother    Hypertension Father    Diabetes Father    COPD Father    Stroke Father    Congestive Heart Failure Father    Diabetes Sister    Hypertension Sister      Current Outpatient Medications:    acetaminophen (TYLENOL) 500 MG tablet, Take 1 tablet (500 mg total) by mouth every 6 (six) hours as needed., Disp: 30 tablet, Rfl: 0   albuterol (PROVENTIL HFA;VENTOLIN HFA) 108 (90 Base) MCG/ACT inhaler, Inhale 1-2 puffs into the lungs every 6 (six) hours as needed for wheezing or shortness of breath., Disp: 1 Inhaler, Rfl: 2   apixaban (ELIQUIS) 2.5 MG TABS tablet, Take 1 tablet (2.5 mg total) by mouth 2 (two) times daily., Disp: 180 tablet, Rfl: 1   atorvastatin (LIPITOR) 20 MG tablet, TAKE 1 TABLET BY MOUTH EVERY DAY, Disp: 90 tablet, Rfl: 3   blood glucose meter kit and supplies KIT, Dispense based on patient and insurance preference. Use up to four times daily as directed. (FOR ICD-9 250.00, 250.01)., Disp: 1 each, Rfl: 0   cetirizine (  ZYRTEC) 10 MG tablet, Take 10 mg by mouth 2 (two) times daily. Pt takes twice a day, Disp: , Rfl:    cyanocobalamin (,VITAMIN B-12,) 1000 MCG/ML injection, Inject into the muscle., Disp: , Rfl:    docusate sodium (COLACE) 100 MG capsule, Take 100 mg by mouth 2 (two) times daily., Disp: , Rfl:    FARXIGA 10 MG TABS tablet, TAKE 1 TABLET(10 MG) BY MOUTH DAILY, Disp: 90 tablet, Rfl: 1   FLUoxetine (PROZAC) 20 MG capsule, TAKE ONE CAPSULE BY MOUTH EVERY DAY, Disp: 90 capsule, Rfl: 2   hydrochlorothiazide (HYDRODIURIL) 25 MG tablet,  Take 1 tablet (25 mg total) by mouth daily., Disp: 90 tablet, Rfl: 2   metFORMIN (GLUCOPHAGE-XR) 500 MG 24 hr tablet, Take 1 tablet (500 mg total) by mouth daily., Disp: 90 tablet, Rfl: 1   olmesartan (BENICAR) 40 MG tablet, Take 40 mg by mouth daily., Disp: , Rfl:    Semaglutide (RYBELSUS) 14 MG TABS, TAKE 1 TABLET BY MOUTH EVERY DAY, Disp: 180 tablet, Rfl: 1   Vitamin D, Ergocalciferol, (DRISDOL) 1.25 MG (50000 UNIT) CAPS capsule, TAKE 1 CAPSULE BY MOUTH TWICE A WEEK ON TUE/FRI AS DIRECTED, Disp: 24 capsule, Rfl: 1   Allergies  Allergen Reactions   Amoxicillin Hives    Has patient had a PCN reaction causing immediate rash, facial/tongue/throat swelling, SOB or lightheadedness with hypotension: No Has patient had a PCN reaction causing severe rash involving mucus membranes or skin necrosis: Yes Has patient had a PCN reaction that required hospitalization No Has patient had a PCN reaction occurring within the last 10 years: No If all of the above answers are "NO", then may proceed with Cephalosporin use.    Fish Allergy Hives     Review of Systems  Constitutional: Negative.   Eyes:  Negative for blurred vision.  Respiratory: Negative.  Negative for shortness of breath.   Cardiovascular: Negative.  Negative for chest pain, palpitations and orthopnea.  Gastrointestinal: Negative.   Neurological: Negative.  Negative for dizziness and headaches.  Psychiatric/Behavioral: Negative.       Today's Vitals   01/10/23 1543  BP: 110/78  Pulse: 90  Temp: 97.8 F (36.6 C)  SpO2: 98%  Weight: 264 lb 3.2 oz (119.8 kg)  Height: 5\' 4"  (1.626 m)   Body mass index is 45.35 kg/m.  Wt Readings from Last 3 Encounters:  01/10/23 264 lb 3.2 oz (119.8 kg)  09/20/22 268 lb (121.6 kg)  07/04/22 267 lb 9.6 oz (121.4 kg)     Objective:  Physical Exam Vitals and nursing note reviewed.  Constitutional:      Appearance: Normal appearance. She is obese.  HENT:     Head: Normocephalic and  atraumatic.  Eyes:     Extraocular Movements: Extraocular movements intact.  Cardiovascular:     Rate and Rhythm: Normal rate and regular rhythm.     Heart sounds: Normal heart sounds.  Pulmonary:     Effort: Pulmonary effort is normal.     Breath sounds: Normal breath sounds.  Musculoskeletal:     Cervical back: Normal range of motion.  Skin:    General: Skin is warm.  Neurological:     General: No focal deficit present.     Mental Status: She is alert.  Psychiatric:        Mood and Affect: Mood normal.        Behavior: Behavior normal.         Assessment And Plan:  Dyslipidemia associated with  type 2 diabetes mellitus (HCC) Assessment & Plan: Chronic, she will continue with Rybelsus 14mg  daily and Farxiga 10mg  daily. Encouraged to follow dietary and exercise recommendations. She will f/u in four months.   Orders: -     Hemoglobin A1c -     BMP8+eGFR -     Ambulatory referral to Ophthalmology  Pulmonary hypertension, unspecified (HCC) Assessment & Plan: She was found to have moderate pulmonary HTN seen on Echo in 2019.  I did order repeat echo May of this year; unfortunately, this was not done. I will speak to Referral coordinator to try to get this scheduled.    Vitamin B12 deficiency -     Cyanocobalamin  Class 3 severe obesity due to excess calories without serious comorbidity with body mass index (BMI) of 45.0 to 49.9 in adult [facility]) Assessment & Plan: BMI 45. She is encouraged to find time to aim for at least 150 minutes of exercise per week. This will allow her to also have another 30 minutes to her self most days of the week.    Immunization due -     Flu vaccine trivalent PF, 6mos and older(Flulaval,Afluria,Fluarix,Fluzone)  She is encouraged to strive for BMI less than 30 to decrease cardiac risk. Advised to aim for at least 150 minutes of exercise per week.    Return move physical to March please.  Patient was given opportunity to ask questions. Patient  verbalized understanding of the plan and was able to repeat key elements of the plan. All questions were answered to their satisfaction.    I, Gwynneth Aliment, MD, have reviewed all documentation for this visit. The documentation on 01/10/23 for the exam, diagnosis, procedures, and orders are all accurate and complete.   IF YOU HAVE BEEN REFERRED TO A SPECIALIST, IT MAY TAKE 1-2 WEEKS TO SCHEDULE/PROCESS THE REFERRAL. IF YOU HAVE NOT HEARD FROM US/SPECIALIST IN TWO WEEKS, PLEASE GIVE [place] A CALL AT [phone].   THE PATIENT IS ENCOURAGED TO PRACTICE SOCIAL DISTANCING DUE TO THE COVID-19 PANDEMIC.

## 2023-01-10 NOTE — Assessment & Plan Note (Signed)
BMI 45. She is encouraged to find time to aim for at least 150 minutes of exercise per week. This will allow her to also have another 30 minutes to her self most days of the week.

## 2023-01-10 NOTE — Patient Instructions (Signed)

## 2023-01-10 NOTE — Assessment & Plan Note (Signed)
Chronic, she will continue with Rybelsus 14mg  daily and Farxiga 10mg  daily. Encouraged to follow dietary and exercise recommendations. She will f/u in four months.

## 2023-01-10 NOTE — Assessment & Plan Note (Signed)
She was found to have moderate pulmonary HTN seen on Echo in 2019.  I did order repeat echo May of this year; unfortunately, this was not done. I will speak to Referral coordinator to try to get this scheduled.

## 2023-01-11 LAB — BMP8+EGFR
BUN/Creatinine Ratio: 17 (ref 9–23)
BUN: 23 mg/dL (ref 6–24)
CO2: 24 mmol/L (ref 20–29)
Calcium: 9.2 mg/dL (ref 8.7–10.2)
Chloride: 105 mmol/L (ref 96–106)
Creatinine, Ser: 1.36 mg/dL — ABNORMAL HIGH (ref 0.57–1.00)
Glucose: 91 mg/dL (ref 70–99)
Potassium: 4.4 mmol/L (ref 3.5–5.2)
Sodium: 142 mmol/L (ref 134–144)
eGFR: 45 mL/min/{1.73_m2} — ABNORMAL LOW (ref >59)

## 2023-01-11 LAB — HEMOGLOBIN A1C
Est. average glucose Bld gHb Est-mCnc: 131 mg/dL
Hgb A1c MFr Bld: 6.2 % — ABNORMAL HIGH (ref 4.8–5.6)

## 2023-01-20 ENCOUNTER — Inpatient Hospital Stay: Payer: BC Managed Care – PPO | Attending: Oncology

## 2023-01-29 ENCOUNTER — Other Ambulatory Visit: Payer: Self-pay | Admitting: Internal Medicine

## 2023-01-30 MED ORDER — OLMESARTAN MEDOXOMIL 40 MG PO TABS: 40.0000 mg | ORAL_TABLET | Freq: Every day | ORAL | 2 refills | Status: DC

## 2023-02-20 ENCOUNTER — Inpatient Hospital Stay: Payer: BC Managed Care – PPO | Attending: Oncology

## 2023-03-15 ENCOUNTER — Encounter: Payer: Self-pay | Admitting: Internal Medicine

## 2023-03-21 ENCOUNTER — Inpatient Hospital Stay: Payer: BC Managed Care – PPO | Attending: Oncology

## 2023-03-21 DIAGNOSIS — Z86711 Personal history of pulmonary embolism: Secondary | ICD-10-CM | POA: Insufficient documentation

## 2023-03-21 DIAGNOSIS — E1122 Type 2 diabetes mellitus with diabetic chronic kidney disease: Secondary | ICD-10-CM | POA: Insufficient documentation

## 2023-03-21 DIAGNOSIS — I252 Old myocardial infarction: Secondary | ICD-10-CM | POA: Diagnosis not present

## 2023-03-21 DIAGNOSIS — Z9884 Bariatric surgery status: Secondary | ICD-10-CM | POA: Diagnosis not present

## 2023-03-21 DIAGNOSIS — D508 Other iron deficiency anemias: Secondary | ICD-10-CM | POA: Diagnosis not present

## 2023-03-21 DIAGNOSIS — N183 Chronic kidney disease, stage 3 unspecified: Secondary | ICD-10-CM | POA: Diagnosis not present

## 2023-03-21 DIAGNOSIS — Z825 Family history of asthma and other chronic lower respiratory diseases: Secondary | ICD-10-CM | POA: Insufficient documentation

## 2023-03-21 DIAGNOSIS — Z8249 Family history of ischemic heart disease and other diseases of the circulatory system: Secondary | ICD-10-CM | POA: Insufficient documentation

## 2023-03-21 DIAGNOSIS — Z7901 Long term (current) use of anticoagulants: Secondary | ICD-10-CM | POA: Insufficient documentation

## 2023-03-21 DIAGNOSIS — Z7984 Long term (current) use of oral hypoglycemic drugs: Secondary | ICD-10-CM | POA: Diagnosis not present

## 2023-03-21 DIAGNOSIS — Z823 Family history of stroke: Secondary | ICD-10-CM | POA: Diagnosis not present

## 2023-03-21 DIAGNOSIS — E538 Deficiency of other specified B group vitamins: Secondary | ICD-10-CM | POA: Insufficient documentation

## 2023-03-21 DIAGNOSIS — Z880 Allergy status to penicillin: Secondary | ICD-10-CM | POA: Insufficient documentation

## 2023-03-21 DIAGNOSIS — Z79899 Other long term (current) drug therapy: Secondary | ICD-10-CM | POA: Diagnosis not present

## 2023-03-21 DIAGNOSIS — Z833 Family history of diabetes mellitus: Secondary | ICD-10-CM | POA: Diagnosis not present

## 2023-03-21 DIAGNOSIS — I129 Hypertensive chronic kidney disease with stage 1 through stage 4 chronic kidney disease, or unspecified chronic kidney disease: Secondary | ICD-10-CM | POA: Diagnosis not present

## 2023-03-21 DIAGNOSIS — Z832 Family history of diseases of the blood and blood-forming organs and certain disorders involving the immune mechanism: Secondary | ICD-10-CM | POA: Insufficient documentation

## 2023-03-21 LAB — CBC WITH DIFFERENTIAL (CANCER CENTER ONLY)
Abs Immature Granulocytes: 0.01 10*3/uL (ref 0.00–0.07)
Basophils Absolute: 0 10*3/uL (ref 0.0–0.1)
Basophils Relative: 0 %
Eosinophils Absolute: 0 10*3/uL (ref 0.0–0.5)
Eosinophils Relative: 1 %
HCT: 36.4 % (ref 36.0–46.0)
Hemoglobin: 11.6 g/dL — ABNORMAL LOW (ref 12.0–15.0)
Immature Granulocytes: 0 %
Lymphocytes Relative: 47 %
Lymphs Abs: 2.2 10*3/uL (ref 0.7–4.0)
MCH: 28.7 pg (ref 26.0–34.0)
MCHC: 31.9 g/dL (ref 30.0–36.0)
MCV: 90.1 fL (ref 80.0–100.0)
Monocytes Absolute: 0.4 10*3/uL (ref 0.1–1.0)
Monocytes Relative: 8 %
Neutro Abs: 2.1 10*3/uL (ref 1.7–7.7)
Neutrophils Relative %: 44 %
Platelet Count: 352 10*3/uL (ref 150–400)
RBC: 4.04 MIL/uL (ref 3.87–5.11)
RDW: 14.2 % (ref 11.5–15.5)
WBC Count: 4.7 10*3/uL (ref 4.0–10.5)
nRBC: 0 % (ref 0.0–0.2)

## 2023-03-21 LAB — CMP (CANCER CENTER ONLY)
ALT: 14 U/L (ref 0–44)
AST: 27 U/L (ref 15–41)
Albumin: 4.1 g/dL (ref 3.5–5.0)
Alkaline Phosphatase: 55 U/L (ref 38–126)
Anion gap: 10 (ref 5–15)
BUN: 29 mg/dL — ABNORMAL HIGH (ref 6–20)
CO2: 23 mmol/L (ref 22–32)
Calcium: 9.2 mg/dL (ref 8.9–10.3)
Chloride: 104 mmol/L (ref 98–111)
Creatinine: 1.39 mg/dL — ABNORMAL HIGH (ref 0.44–1.00)
GFR, Estimated: 44 mL/min — ABNORMAL LOW (ref >60)
Glucose, Bld: 103 mg/dL — ABNORMAL HIGH (ref 70–99)
Potassium: 4.3 mmol/L (ref 3.5–5.1)
Sodium: 137 mmol/L (ref 135–145)
Total Bilirubin: 0.9 mg/dL (ref 0.0–1.2)
Total Protein: 8.2 g/dL — ABNORMAL HIGH (ref 6.5–8.1)

## 2023-03-21 LAB — FERRITIN: Ferritin: 81 ng/mL (ref 11–307)

## 2023-03-21 LAB — IRON AND TIBC
Iron: 81 ug/dL (ref 28–170)
Saturation Ratios: 22 % (ref 10.4–31.8)
TIBC: 370 ug/dL (ref 250–450)
UIBC: 289 ug/dL

## 2023-03-21 LAB — VITAMIN B12: Vitamin B-12: 240 pg/mL (ref 180–914)

## 2023-03-22 ENCOUNTER — Other Ambulatory Visit: Payer: Self-pay | Admitting: Internal Medicine

## 2023-03-22 ENCOUNTER — Other Ambulatory Visit: Payer: Self-pay | Admitting: Nurse Practitioner

## 2023-03-23 ENCOUNTER — Inpatient Hospital Stay: Payer: BC Managed Care – PPO

## 2023-03-23 ENCOUNTER — Encounter: Payer: Self-pay | Admitting: Oncology

## 2023-03-23 ENCOUNTER — Inpatient Hospital Stay: Payer: BC Managed Care – PPO | Admitting: Oncology

## 2023-03-23 VITALS — BP 100/74 | HR 84 | Temp 97.0°F | Resp 18

## 2023-03-23 VITALS — BP 103/65 | HR 97 | Temp 97.8°F | Resp 18 | Wt 264.4 lb

## 2023-03-23 DIAGNOSIS — N1831 Chronic kidney disease, stage 3a: Secondary | ICD-10-CM

## 2023-03-23 DIAGNOSIS — E538 Deficiency of other specified B group vitamins: Secondary | ICD-10-CM | POA: Diagnosis not present

## 2023-03-23 DIAGNOSIS — Z86711 Personal history of pulmonary embolism: Secondary | ICD-10-CM

## 2023-03-23 DIAGNOSIS — D508 Other iron deficiency anemias: Secondary | ICD-10-CM | POA: Diagnosis not present

## 2023-03-23 DIAGNOSIS — D500 Iron deficiency anemia secondary to blood loss (chronic): Secondary | ICD-10-CM

## 2023-03-23 MED ORDER — SODIUM CHLORIDE 0.9% FLUSH
10.0000 mL | Freq: Once | INTRAVENOUS | Status: AC | PRN
  Administered 2023-03-23: 10 mL
  Filled 2023-03-23: qty 10

## 2023-03-23 MED ORDER — IRON SUCROSE 20 MG/ML IV SOLN
200.0000 mg | Freq: Once | INTRAVENOUS | Status: AC
  Administered 2023-03-23: 200 mg via INTRAVENOUS
  Filled 2023-03-23: qty 10

## 2023-03-23 MED ORDER — CYANOCOBALAMIN 1000 MCG/ML IJ SOLN
1000.0000 ug | Freq: Once | INTRAMUSCULAR | Status: AC
  Administered 2023-03-23: 1000 ug via INTRAMUSCULAR
  Filled 2023-03-23: qty 1

## 2023-03-23 MED ORDER — APIXABAN 2.5 MG PO TABS: 2.5000 mg | ORAL_TABLET | Freq: Two times a day (BID) | ORAL | 1 refills | Status: DC

## 2023-03-23 NOTE — Assessment & Plan Note (Signed)
 Encourage oral hydration and avoid nephrotoxins.

## 2023-03-23 NOTE — Assessment & Plan Note (Signed)
Continue monthly parenteral vitamin b12 1022mg injections

## 2023-03-23 NOTE — Progress Notes (Signed)
 Hematology/Oncology Progress note [phone] [phone]    REASON FOR VISIT  follow-up for management of pulmonary embolism, IDA  ASSESSMENT & PLAN:   History of pulmonary embolism continue Eliquis  2.5mg  BID.  Refilled Rx  Vitamin B12 deficiency  Continue monthly parenteral vitamin b12 1000mcg injections  CKD (chronic kidney disease) stage 3, GFR 30-59 ml/min (HCC) Encourage oral hydration and avoid nephrotoxins.    Iron  deficiency anemia History of bariatric surgery/gastric sleeve Labs are reviewed and discussed with patient. Lab Results  Component Value Date   HGB 11.6 (L) 03/21/2023   TIBC 370 03/21/2023   IRONPCTSAT 22 03/21/2023   FERRITIN 81 03/21/2023    Hemoglobin is stable.  One dose of maintenance Venofer  200mg   Orders Placed This Encounter  Procedures   CMP (Cancer Center only)    Standing Status:   Future    Expected Date:   09/20/2023    Expiration Date:   03/22/2024   CBC with Differential (Cancer Center Only)    Standing Status:   Future    Expected Date:   09/20/2023    Expiration Date:   03/22/2024   Iron  and TIBC    Standing Status:   Future    Expected Date:   09/20/2023    Expiration Date:   03/22/2024   Ferritin    Standing Status:   Future    Expected Date:   09/20/2023    Expiration Date:   03/22/2024   Vitamin B12    Standing Status:   Future    Expected Date:   09/20/2023    Expiration Date:   03/22/2024   Follow up in 6 months All questions were answered. The patient knows to call the clinic with any problems, questions or concerns.  Zelphia Cap, MD, PhD [facility] Hematology Oncology 03/23/2023     PERTINENT HEMATOLOGY HISTORY Kristen [name] is a 58 y.o.afemale who has above oncology history reviewed by me today presented for follow up visit for management of pulmonary embolism.  Patient was seen by me during hospitalization.. Patient presents to ER for evaluation of syncope episode. Patient fainted but did not  fall or hurt herself as family member caught patient.  EKG in ED showed ST changed. Troponin elevated and started on heparin  drip and admitted.  On day 1 of admission, CODE BLUE was called and patient was unresponsive and received brief chest compression. Patient woke up and talked.  No cardiac arrest.  She reports feeling dizzy and lightheaded and passed out.  Similar to the syncope episode prior to admission. Cardiac catheterization showed normal LV systolic function and hyperdynamic in nature with ejection fraction of 70% consistent with hyper trophic cardiomyopathy.  Significantly elevated pulmonary pressure possibly consistent with pulmonary embolism.  No cardiac intervention due to normal coronary arteries. Patient had VQ scan on 02/15/2018 which showed hypermobility for pulmonary embolism Patient was on heparin  drip and was transitioned to Eliquis  10 mg twice daily started on 02/16/2018.  # PE finished 6 months of Eliquis  5mg  BID.  # History of gastric sleeve  INTERVAL HISTORY Kristen [name] is a 58 y.o. y.o.female who has above oncology history reviewed by me today presented for follow up visit for management of pulmonary embolism, iron  deficiency, vitamin B12 deficiency.  Patient reports feeling well.  She takes Eliquis  2.5 mg twice daily. Denies bleeding events.  Patient is on monthly B12 injections, missed two doses. .     . Review of Systems  Constitutional:  Negative for appetite change, chills, fatigue and fever.  HENT:   Negative for hearing loss and voice change.   Eyes:  Negative for eye problems.  Respiratory:  Negative for chest tightness, cough and shortness of breath.   Cardiovascular:  Negative for chest pain.  Gastrointestinal:  Negative for abdominal distention, abdominal pain and blood in stool.  Endocrine: Negative for hot flashes.  Genitourinary:  Negative for difficulty urinating and frequency.   Musculoskeletal:  Negative for arthralgias.  Skin:  Negative  for itching and rash.  Neurological:  Negative for extremity weakness.  Hematological:  Negative for adenopathy.  Psychiatric/Behavioral:  Negative for confusion.       Allergies  Allergen Reactions   Amoxicillin Hives    Has patient had a PCN reaction causing immediate rash, facial/tongue/throat swelling, SOB or lightheadedness with hypotension: No Has patient had a PCN reaction causing severe rash involving mucus membranes or skin necrosis: Yes Has patient had a PCN reaction that required hospitalization No Has patient had a PCN reaction occurring within the last 10 years: No If all of the above answers are NO, then may proceed with Cephalosporin use.    Fish Allergy Hives     Past Medical History:  Diagnosis Date   Alopecia    Arthritis    B12 deficiency    Diabetes (HCC)    Type 2   Hypertension    Iron  deficiency anemia 03/07/2018   Non-STEMI (non-ST elevated myocardial infarction) (HCC) 2020   Pulmonary embolism (HCC)    URI (upper respiratory infection)    took antibiotic and prednisone oct 2019 resolved   Uses contact lenses      Past Surgical History:  Procedure Laterality Date   BREAST BIOPSY Right 2015   COLONOSCOPY WITH PROPOFOL  N/A 03/18/2016   Procedure: COLONOSCOPY WITH PROPOFOL ;  Surgeon: Belvie Just, MD;  Location: [facility];  Service: Endoscopy;  Laterality: N/A;   LAPAROSCOPIC GASTRIC SLEEVE RESECTION N/A 01/29/2018   Procedure: LAPAROSCOPIC GASTRIC SLEEVE RESECTION WITH UPPER ENDO AND ERAS PATHWAY;  Surgeon: Kinsinger, Herlene Righter, MD;  Location: [facility];  Service: General;  Laterality: N/A;   RIGHT/LEFT HEART CATH AND CORONARY ANGIOGRAPHY N/A 02/15/2018   Procedure: RIGHT/LEFT HEART CATH AND CORONARY ANGIOGRAPHY;  Surgeon: Hester Wolm PARAS, MD;  Location: [facility];  Service: Cardiovascular;  Laterality: N/A;   TOTAL HIP ARTHROPLASTY Right 08/04/2017   Procedure: RIGHT TOTAL HIP ARTHROPLASTY ANTERIOR APPROACH;  Surgeon: Vernetta Lonni GRADE, MD;  Location: [facility];  Service: Orthopedics;  Laterality: Right;    Social History   Socioeconomic History   Marital status: Married    Spouse name: Not on file   Number of children: Not on file   Years of education: Not on file   Highest education level: Master's degree (e.g., MA, MS, MEng, MEd, MSW, MBA)  Occupational History   Not on file  Tobacco Use   Smoking status: Never   Smokeless tobacco: Never  Vaping Use   Vaping status: Never Used  Substance and Sexual Activity   Alcohol use: Not Currently    Comment: OCCASIONAL    Drug use: No   Sexual activity: Yes    Birth control/protection: None  Other Topics Concern   Not on file  Social History Narrative   Not on file   Social Drivers of Health   Financial Resource Strain: Low Risk  (01/10/2023)   Overall Financial Resource Strain (CARDIA)    Difficulty of Paying Living Expenses: Not hard at  all  Food Insecurity: No Food Insecurity (01/10/2023)   Hunger Vital Sign    Worried About Running Out of Food in the Last Year: Never true    Ran Out of Food in the Last Year: Never true  Transportation Needs: No Transportation Needs (01/10/2023)   PRAPARE - Administrator, Civil Service (Medical): No    Lack of Transportation (Non-Medical): No  Physical Activity: Insufficiently Active (01/10/2023)   Exercise Vital Sign    Days of Exercise per Week: 3 days    Minutes of Exercise per Session: 20 min  Stress: No Stress Concern Present (01/10/2023)   [employer] of Occupational Health - Occupational Stress Questionnaire    Feeling of Stress : Only a little  Social Connections: Socially Integrated (01/10/2023)   Social Connection and Isolation Panel [NHANES]    Frequency of Communication with Friends and Family: More than three times a week    Frequency of Social Gatherings with Friends and Family: Twice a week    Attends Religious Services: More than 4 times per year    Active Member of [employer]  or Organizations: Yes    Attends Engineer, Structural: More than 4 times per year    Marital Status: Married  Catering Manager Violence: Not on file    Family History  Problem Relation Age of Onset   Hypertension Mother    Diabetes Mother    Hypertension Father    Diabetes Father    COPD Father    Stroke Father    Congestive Heart Failure Father    Diabetes Sister    Hypertension Sister      Current Outpatient Medications:    acetaminophen  (TYLENOL ) 500 MG tablet, Take 1 tablet (500 mg total) by mouth every 6 (six) hours as needed., Disp: 30 tablet, Rfl: 0   albuterol  (PROVENTIL  HFA;VENTOLIN  HFA) 108 (90 Base) MCG/ACT inhaler, Inhale 1-2 puffs into the lungs every 6 (six) hours as needed for wheezing or shortness of breath., Disp: 1 Inhaler, Rfl: 2   apixaban  (ELIQUIS ) 2.5 MG TABS tablet, Take 1 tablet (2.5 mg total) by mouth 2 (two) times daily., Disp: 180 tablet, Rfl: 1   atorvastatin  (LIPITOR) 20 MG tablet, TAKE 1 TABLET BY MOUTH EVERY DAY, Disp: 90 tablet, Rfl: 3   blood glucose meter kit and supplies KIT, Dispense based on patient and insurance preference. Use up to four times daily as directed. (FOR ICD-9 250.00, 250.01)., Disp: 1 each, Rfl: 0   cetirizine (ZYRTEC) 10 MG tablet, Take 10 mg by mouth 2 (two) times daily. Pt takes twice a day, Disp: , Rfl:    cyanocobalamin  (,VITAMIN B-12,) 1000 MCG/ML injection, Inject into the muscle., Disp: , Rfl:    docusate sodium  (COLACE) 100 MG capsule, Take 100 mg by mouth 2 (two) times daily., Disp: , Rfl:    FARXIGA  10 MG TABS tablet, TAKE 1 TABLET(10 MG) BY MOUTH DAILY, Disp: 90 tablet, Rfl: 1   FLUoxetine  (PROZAC ) 20 MG capsule, TAKE ONE CAPSULE BY MOUTH EVERY DAY, Disp: 90 capsule, Rfl: 2   hydrochlorothiazide  (HYDRODIURIL ) 25 MG tablet, Take 1 tablet (25 mg total) by mouth daily., Disp: 90 tablet, Rfl: 2   metFORMIN  (GLUCOPHAGE -XR) 500 MG 24 hr tablet, Take 1 tablet (500 mg total) by mouth daily., Disp: 90 tablet, Rfl: 1    olmesartan  (BENICAR ) 40 MG tablet, Take 1 tablet (40 mg total) by mouth daily., Disp: 90 tablet, Rfl: 2   Semaglutide  (RYBELSUS ) 14 MG TABS, TAKE 1  TABLET BY MOUTH EVERY DAY, Disp: 180 tablet, Rfl: 1   Vitamin D , Ergocalciferol , (DRISDOL ) 1.25 MG (50000 UNIT) CAPS capsule, TAKE 1 CAPSULE BY MOUTH TWICE A WEEK ON TUE/FRI AS DIRECTED, Disp: 24 capsule, Rfl: 1  Physical exam:  Vitals:   03/23/23 1403  BP: 103/65  Pulse: 97  Resp: 18  Temp: 97.8 F (36.6 C)  TempSrc: Tympanic  SpO2: 99%  Weight: 264 lb 6.4 oz (119.9 kg)   Physical Exam Constitutional:      General: She is not in acute distress.    Comments: Morbidly obese.   HENT:     Head: Normocephalic and atraumatic.     Comments: Chronic alopecia Eyes:     General: No scleral icterus.    Pupils: Pupils are equal, round, and reactive to light.  Cardiovascular:     Rate and Rhythm: Normal rate.  Pulmonary:     Effort: Pulmonary effort is normal. No respiratory distress.     Breath sounds: No wheezing.  Abdominal:     General: There is no distension.     Palpations: Abdomen is soft.  Musculoskeletal:        General: No deformity. Normal range of motion.     Cervical back: Normal range of motion and neck supple.  Skin:    General: Skin is warm and dry.     Findings: No erythema or rash.  Neurological:     Mental Status: She is alert and oriented to person, place, and time. Mental status is at baseline.     Cranial Nerves: No cranial nerve deficit.  Psychiatric:        Mood and Affect: Mood normal.    LABORATORY STUDIES    Latest Ref Rng & Units 03/21/2023    2:01 PM 09/16/2022   11:27 AM 03/22/2022    2:31 PM  CBC  WBC 4.0 - 10.5 K/uL 4.7  4.9  5.4   Hemoglobin 12.0 - 15.0 g/dL 88.3  88.6  88.8   Hematocrit 36.0 - 46.0 % 36.4  35.2  34.8   Platelets 150 - 400 K/uL 352  347  319       Latest Ref Rng & Units 03/21/2023    2:01 PM 01/10/2023    4:25 PM 09/16/2022   11:27 AM  CMP  Glucose 70 - 99 mg/dL 896  91  865    BUN 6 - 20 mg/dL 29  23  25    Creatinine 0.44 - 1.00 mg/dL 8.60  8.63  8.56   Sodium 135 - 145 mmol/L 137  142  138   Potassium 3.5 - 5.1 mmol/L 4.3  4.4  4.1   Chloride 98 - 111 mmol/L 104  105  107   CO2 22 - 32 mmol/L 23  24  22    Calcium  8.9 - 10.3 mg/dL 9.2  9.2  8.9   Total Protein 6.5 - 8.1 g/dL 8.2   7.6   Total Bilirubin 0.0 - 1.2 mg/dL 0.9   0.5   Alkaline Phos 38 - 126 U/L 55   57   AST 15 - 41 U/L 27   23   ALT 0 - 44 U/L 14   14    Iron /TIBC/Ferritin/ %Sat    Component Value Date/Time   IRON  81 03/21/2023 1401   TIBC 370 03/21/2023 1401   FERRITIN 81 03/21/2023 1401   IRONPCTSAT 22 03/21/2023 1401      RADIOGRAPHIC STUDIES: I have personally reviewed the radiological images as  listed and agreed with the findings in the report. No results found.

## 2023-03-23 NOTE — Assessment & Plan Note (Signed)
continue Eliquis 2.'5mg'$  BID.  Refilled Rx

## 2023-03-23 NOTE — Assessment & Plan Note (Signed)
 History of bariatric surgery/gastric sleeve Labs are reviewed and discussed with patient. Lab Results  Component Value Date   HGB 11.6 (L) 03/21/2023   TIBC 370 03/21/2023   IRONPCTSAT 22 03/21/2023   FERRITIN 81 03/21/2023    Hemoglobin is stable.  One dose of maintenance Venofer  200mg 

## 2023-04-20 ENCOUNTER — Inpatient Hospital Stay: Payer: BC Managed Care – PPO | Attending: Oncology

## 2023-04-24 ENCOUNTER — Ambulatory Visit: Payer: BC Managed Care – PPO | Admitting: Internal Medicine

## 2023-04-24 VITALS — BP 124/82 | HR 77 | Temp 98.8°F | Ht 64.0 in | Wt 270.8 lb

## 2023-04-24 DIAGNOSIS — E1169 Type 2 diabetes mellitus with other specified complication: Secondary | ICD-10-CM

## 2023-04-24 DIAGNOSIS — I10 Essential (primary) hypertension: Secondary | ICD-10-CM

## 2023-04-24 DIAGNOSIS — E538 Deficiency of other specified B group vitamins: Secondary | ICD-10-CM | POA: Diagnosis not present

## 2023-04-24 DIAGNOSIS — Z0000 Encounter for general adult medical examination without abnormal findings: Secondary | ICD-10-CM

## 2023-04-24 DIAGNOSIS — Z23 Encounter for immunization: Secondary | ICD-10-CM

## 2023-04-24 DIAGNOSIS — E785 Hyperlipidemia, unspecified: Secondary | ICD-10-CM | POA: Diagnosis not present

## 2023-04-24 LAB — POCT URINALYSIS DIPSTICK
Bilirubin, UA: NEGATIVE
Glucose, UA: POSITIVE — AB
Ketones, UA: NEGATIVE
Leukocytes, UA: NEGATIVE
Nitrite, UA: NEGATIVE
Protein, UA: POSITIVE — AB
Spec Grav, UA: 1.025 (ref 1.010–1.025)
Urobilinogen, UA: 0.2 U/dL
pH, UA: 5.5 (ref 5.0–8.0)

## 2023-04-24 MED ORDER — HYDROCHLOROTHIAZIDE 25 MG PO TABS: 25.0000 mg | ORAL_TABLET | Freq: Every day | ORAL | 2 refills | Status: DC

## 2023-04-24 MED ORDER — METFORMIN HCL ER 500 MG PO TB24: 500.0000 mg | ORAL_TABLET | Freq: Every day | ORAL | 1 refills | Status: DC

## 2023-04-24 MED ORDER — RYBELSUS 14 MG PO TABS: 1.0000 | ORAL_TABLET | Freq: Every day | ORAL | 1 refills | Status: DC

## 2023-04-24 MED ORDER — CYANOCOBALAMIN 1000 MCG/ML IJ SOLN
1000.0000 ug | Freq: Once | INTRAMUSCULAR | Status: AC
  Administered 2023-04-24: 1000 ug via INTRAMUSCULAR

## 2023-04-24 MED ORDER — FLUOXETINE HCL 20 MG PO CAPS: ORAL_CAPSULE | ORAL | 2 refills | Status: DC

## 2023-04-24 MED ORDER — DAPAGLIFLOZIN PROPANEDIOL 10 MG PO TABS: 10.0000 mg | ORAL_TABLET | Freq: Every day | ORAL | 2 refills | Status: DC

## 2023-04-24 NOTE — Progress Notes (Addendum)
 I,Victoria T Deloria Lair, CMA,acting as a Neurosurgeon for Gwynneth Aliment, MD.,have documented all relevant documentation on the behalf of Gwynneth Aliment, MD,as directed by  Gwynneth Aliment, MD while in the presence of Gwynneth Aliment, MD.  Subjective:    Patient ID: Kristen [name] , female    DOB: [birth year] , 57 y.o.   MRN: [id number]  Chief Complaint  Patient presents with   Annual Exam   Diabetes   Hypertension    HPI  Patient is here for physical exam. She is followed by Dr Dallas Schimke at [facility]. She reports compliance with medications. Denies headache, chest pain & sob.   She agrees to schedule DM eye exam.  She would like to get B12 injection today. She missed her most recent Hematology appt.  Diabetes She presents for her follow-up diabetic visit. She has type 2 diabetes mellitus. Her disease course has been stable. There are no hypoglycemic associated symptoms. Pertinent negatives for hypoglycemia include no dizziness or headaches. Pertinent negatives for diabetes include no chest pain. There are no hypoglycemic complications. Symptoms are stable. Risk factors for coronary artery disease include obesity and sedentary lifestyle. She is compliant with treatment all of the time. She is following a generally healthy diet. She has had a previous visit with a dietitian. She rarely participates in exercise. She does not see a podiatrist.Eye exam is current.  Hypertension This is a chronic problem. The current episode started more than 1 year ago. The problem has been gradually improving since onset. The problem is controlled. Pertinent negatives include no anxiety, chest pain, headaches or palpitations. There are no known risk factors for coronary artery disease. Past treatments include nothing. The current treatment provides no improvement. There are no compliance problems.  There is no history of angina. There is no history of chronic renal disease.     Past Medical History:   Diagnosis Date   Alopecia    Arthritis    B12 deficiency    Diabetes (HCC)    Type 2   Hypertension    Iron deficiency anemia 03/07/2018   Non-STEMI (non-ST elevated myocardial infarction) (HCC) 2020   Pulmonary embolism (HCC)    URI (upper respiratory infection)    took antibiotic and prednisone oct 2019 resolved   Uses contact lenses      Family History  Problem Relation Age of Onset   Hypertension Mother    Diabetes Mother    Hypertension Father    Diabetes Father    COPD Father    Stroke Father    Congestive Heart Failure Father    Diabetes Sister    Hypertension Sister      Current Outpatient Medications:    acetaminophen (TYLENOL) 500 MG tablet, Take 1 tablet (500 mg total) by mouth every 6 (six) hours as needed., Disp: 30 tablet, Rfl: 0   apixaban (ELIQUIS) 2.5 MG TABS tablet, Take 1 tablet (2.5 mg total) by mouth 2 (two) times daily., Disp: 180 tablet, Rfl: 1   atorvastatin (LIPITOR) 20 MG tablet, TAKE 1 TABLET BY MOUTH EVERY DAY, Disp: 90 tablet, Rfl: 3   cetirizine (ZYRTEC) 10 MG tablet, Take 10 mg by mouth 2 (two) times daily. Pt takes twice a day, Disp: , Rfl:    cyanocobalamin (,VITAMIN B-12,) 1000 MCG/ML injection, Inject into the muscle., Disp: , Rfl:    docusate sodium (COLACE) 100 MG capsule, Take 100 mg by mouth 2 (two) times daily., Disp: , Rfl:    olmesartan (BENICAR)  40 MG tablet, Take 1 tablet (40 mg total) by mouth daily., Disp: 90 tablet, Rfl: 2   Vitamin D, Ergocalciferol, (DRISDOL) 1.25 MG (50000 UNIT) CAPS capsule, TAKE 1 CAPSULE BY MOUTH TWICE A WEEK ON TUES/FRI AS DIRECTED, Disp: 24 capsule, Rfl: 1   dapagliflozin propanediol (FARXIGA) 10 MG TABS tablet, Take 1 tablet (10 mg total) by mouth daily., Disp: 90 tablet, Rfl: 2   FLUoxetine (PROZAC) 20 MG capsule, TAKE ONE CAPSULE BY MOUTH EVERY DAY, Disp: 90 capsule, Rfl: 2   hydrochlorothiazide (HYDRODIURIL) 25 MG tablet, Take 1 tablet (25 mg total) by mouth daily., Disp: 90 tablet, Rfl: 2    metFORMIN (GLUCOPHAGE-XR) 500 MG 24 hr tablet, Take 1 tablet (500 mg total) by mouth daily with breakfast., Disp: 90 tablet, Rfl: 1   Semaglutide (RYBELSUS) 14 MG TABS, Take 1 tablet (14 mg total) by mouth daily., Disp: 180 tablet, Rfl: 1   Allergies  Allergen Reactions   Amoxicillin Hives    Has patient had a PCN reaction causing immediate rash, facial/tongue/throat swelling, SOB or lightheadedness with hypotension: No Has patient had a PCN reaction causing severe rash involving mucus membranes or skin necrosis: Yes Has patient had a PCN reaction that required hospitalization No Has patient had a PCN reaction occurring within the last 10 years: No If all of the above answers are "NO", then may proceed with Cephalosporin use.    Fish Allergy Hives      The patient states she uses none for birth control. No LMP recorded. (Menstrual status: Irregular Periods).. Negative for Dysmenorrhea. Negative for: breast discharge, breast lump(s), breast pain and breast self exam. Associated symptoms include abnormal vaginal bleeding. Pertinent negatives include abnormal bleeding (hematology), anxiety, decreased libido, depression, difficulty falling sleep, dyspareunia, history of infertility, nocturia, sexual dysfunction, sleep disturbances, urinary incontinence, urinary urgency, vaginal discharge and vaginal itching. Diet regular.The patient states her exercise level is    . The patient's tobacco use is:  Social History   Tobacco Use  Smoking Status Never  Smokeless Tobacco Never  . She has been exposed to passive smoke. The patient's alcohol use is:  Social History   Substance and Sexual Activity  Alcohol Use Not Currently   Comment: OCCASIONAL     Review of Systems  Constitutional: Negative.   HENT: Negative.    Eyes: Negative.   Respiratory: Negative.    Cardiovascular: Negative.  Negative for chest pain and palpitations.  Gastrointestinal: Negative.   Endocrine: Negative.    Genitourinary: Negative.   Musculoskeletal: Negative.   Skin: Negative.   Allergic/Immunologic: Negative.   Neurological: Negative.  Negative for dizziness and headaches.  Hematological: Negative.   Psychiatric/Behavioral: Negative.       Today's Vitals   04/24/23 1507  BP: 124/82  Pulse: 77  Temp: 98.8 F (37.1 C)  SpO2: 98%  Weight: 270 lb 12.8 oz (122.8 kg)  Height: 5\' 4"  (1.626 m)   Body mass index is 46.48 kg/m.  Wt Readings from Last 3 Encounters:  04/24/23 270 lb 12.8 oz (122.8 kg)  03/23/23 264 lb 6.4 oz (119.9 kg)  01/10/23 264 lb 3.2 oz (119.8 kg)     Objective:  Physical Exam Constitutional:      General: She is not in acute distress.    Appearance: Normal appearance. She is well-developed. She is obese.  HENT:     Head: Normocephalic and atraumatic.     Right Ear: Hearing, tympanic membrane, ear canal and external ear normal. There is no impacted  cerumen.     Left Ear: Hearing, tympanic membrane, ear canal and external ear normal. There is no impacted cerumen.     Nose:     Comments: Deferred, masked    Mouth/Throat:     Comments: deferred Eyes:     General: Lids are normal.     Extraocular Movements: Extraocular movements intact.     Conjunctiva/sclera: Conjunctivae normal.     Pupils: Pupils are equal, round, and reactive to light.     Funduscopic exam:    Right eye: No papilledema.        Left eye: No papilledema.  Neck:     Thyroid: No thyroid mass.     Vascular: No carotid bruit.  Cardiovascular:     Rate and Rhythm: Normal rate and regular rhythm.     Pulses: Normal pulses.          Dorsalis pedis pulses are 2+ on the right side and 2+ on the left side.     Heart sounds: Normal heart sounds. No murmur heard. Pulmonary:     Effort: Pulmonary effort is normal.     Breath sounds: Normal breath sounds.  Chest:  Breasts:    Tanner Score is 5.     Right: Normal.     Left: Normal.  Abdominal:     General: Bowel sounds are normal. There is  no distension.     Palpations: Abdomen is soft.     Tenderness: There is no abdominal tenderness.     Comments: Obese, soft. Difficult to assess organomegaly.  Musculoskeletal:        General: No swelling. Normal range of motion.     Cervical back: Full passive range of motion without pain, normal range of motion and neck supple.     Right lower leg: No edema.     Left lower leg: No edema.  Feet:     Right foot:     Protective Sensation: 5 sites tested.  5 sites sensed.     Skin integrity: Callus and dry skin present.     Toenail Condition: Right toenails are normal.     Left foot:     Protective Sensation: 5 sites tested.  5 sites sensed.     Skin integrity: Callus and dry skin present.     Toenail Condition: Left toenails are normal.  Skin:    General: Skin is warm and dry.     Capillary Refill: Capillary refill takes less than 2 seconds.  Neurological:     General: No focal deficit present.     Mental Status: She is alert and oriented to person, place, and time.     Cranial Nerves: No cranial nerve deficit.     Sensory: No sensory deficit.  Psychiatric:        Mood and Affect: Mood normal.        Behavior: Behavior normal.        Thought Content: Thought content normal.        Judgment: Judgment normal.         Assessment And Plan:     Health maintenance examination Assessment & Plan: A full exam was performed.  Importance of monthly self breast exams was discussed with the patient.  She is advised to get 30-45 minutes of regular exercise, no less than four to five days per week. Both weight-bearing and aerobic exercises are recommended.  She is advised to follow a healthy diet with at least six fruits/veggies per day, decrease  intake of red meat and other saturated fats and to increase fish intake to twice weekly.  Meats/fish should not be fried -- baked, boiled or broiled is preferable. It is also important to cut back on your sugar intake.  Be sure to read labels - try to  avoid anything with added sugar, high fructose corn syrup or other sweeteners.  If you must use a sweetener, you can try stevia or monkfruit.  It is also important to avoid artificially sweetened foods/beverages and diet drinks. Lastly, wear SPF 50 sunscreen on exposed skin and when in direct sunlight for an extended period of time.  Be sure to avoid fast food restaurants and aim for at least 60 ounces of water daily.      Orders: -     Lipid panel -     Hemoglobin A1c  Dyslipidemia associated with type 2 diabetes mellitus (HCC) Assessment & Plan: Chronic, diabetic foot exam was performed.  She will continue with Rybelsus 14mg  daily and Farxiga 10mg  daily. Encouraged to follow dietary and exercise recommendations. She will f/u in four months. I DISCUSSED WITH THE PATIENT AT LENGTH REGARDING THE GOALS OF GLYCEMIC CONTROL AND POSSIBLE LONG-TERM COMPLICATIONS.  I  ALSO STRESSED THE IMPORTANCE OF COMPLIANCE WITH HOME GLUCOSE MONITORING, DIETARY RESTRICTIONS INCLUDING AVOIDANCE OF SUGARY DRINKS/PROCESSED FOODS,  ALONG WITH REGULAR EXERCISE.  I  ALSO STRESSED THE IMPORTANCE OF ANNUAL EYE EXAMS, SELF FOOT CARE AND COMPLIANCE WITH OFFICE VISITS.   Orders: -     POCT urinalysis dipstick -     Microalbumin / creatinine urine ratio -     Hemoglobin A1c  Essential hypertension [I10] Assessment & Plan: Chronic, fair control. Goal BP<120/80.  She will continue olmesartan 40mg  and hydrochlorothiazide 25mg  daily.  She is encouraged to follow low sodium diet. She will f/u in four months for re-evaluation.   Orders: -     POCT urinalysis dipstick -     Microalbumin / creatinine urine ratio -     EKG 12-Lead  Vitamin B12 deficiency -     Cyanocobalamin  Immunization due -     Pneumococcal conjugate vaccine 20-valent  Other orders -     Dapagliflozin Propanediol; Take 1 tablet (10 mg total) by mouth daily.  Dispense: 90 tablet; Refill: 2 -     FLUoxetine HCl; TAKE ONE CAPSULE BY MOUTH EVERY DAY   Dispense: 90 capsule; Refill: 2 -     hydroCHLOROthiazide; Take 1 tablet (25 mg total) by mouth daily.  Dispense: 90 tablet; Refill: 2 -     metFORMIN HCl ER; Take 1 tablet (500 mg total) by mouth daily with breakfast.  Dispense: 90 tablet; Refill: 1 -     Rybelsus; Take 1 tablet (14 mg total) by mouth daily.  Dispense: 180 tablet; Refill: 1     Return for 1 year physical, 4 month DM. Patient was given opportunity to ask questions. Patient verbalized understanding of the plan and was able to repeat key elements of the plan. All questions were answered to their satisfaction.    I, Gwynneth Aliment, MD, have reviewed all documentation for this visit. The documentation on 04/24/23 for the exam, diagnosis, procedures, and orders are all accurate and complete.

## 2023-04-24 NOTE — Patient Instructions (Signed)

## 2023-04-24 NOTE — Assessment & Plan Note (Signed)
 Chronic, diabetic foot exam was performed.  She will continue with Rybelsus 14mg  daily and Farxiga 10mg  daily. Encouraged to follow dietary and exercise recommendations. She will f/u in four months. I DISCUSSED WITH THE PATIENT AT LENGTH REGARDING THE GOALS OF GLYCEMIC CONTROL AND POSSIBLE LONG-TERM COMPLICATIONS.  I  ALSO STRESSED THE IMPORTANCE OF COMPLIANCE WITH HOME GLUCOSE MONITORING, DIETARY RESTRICTIONS INCLUDING AVOIDANCE OF SUGARY DRINKS/PROCESSED FOODS,  ALONG WITH REGULAR EXERCISE.  I  ALSO STRESSED THE IMPORTANCE OF ANNUAL EYE EXAMS, SELF FOOT CARE AND COMPLIANCE WITH OFFICE VISITS.

## 2023-04-24 NOTE — Assessment & Plan Note (Signed)
 Chronic, fair control. Goal BP<120/80.  She will continue olmesartan 40mg  and hydrochlorothiazide 25mg  daily.  She is encouraged to follow low sodium diet. She will f/u in four months for re-evaluation.

## 2023-04-24 NOTE — Assessment & Plan Note (Signed)

## 2023-04-25 ENCOUNTER — Telehealth: Payer: Self-pay

## 2023-04-25 LAB — LIPID PANEL
Chol/HDL Ratio: 2.4 ratio (ref 0.0–4.4)
Cholesterol, Total: 126 mg/dL (ref 100–199)
HDL: 53 mg/dL (ref >39)
LDL Chol Calc (NIH): 54 mg/dL (ref 0–99)
Triglycerides: 102 mg/dL (ref 0–149)
VLDL Cholesterol Cal: 19 mg/dL (ref 5–40)

## 2023-04-25 LAB — HEMOGLOBIN A1C
Est. average glucose Bld gHb Est-mCnc: 134 mg/dL
Hgb A1c MFr Bld: 6.3 % — ABNORMAL HIGH (ref 4.8–5.6)

## 2023-04-25 LAB — MICROALBUMIN / CREATININE URINE RATIO
Creatinine, Urine: 152.4 mg/dL
Microalb/Creat Ratio: 23 mg/g{creat} (ref 0–29)
Microalbumin, Urine: 35.1 ug/mL

## 2023-05-22 ENCOUNTER — Inpatient Hospital Stay: Payer: BC Managed Care – PPO | Attending: Oncology

## 2023-06-21 ENCOUNTER — Inpatient Hospital Stay: Payer: BC Managed Care – PPO | Attending: Oncology

## 2023-06-27 ENCOUNTER — Other Ambulatory Visit: Payer: Self-pay | Admitting: Internal Medicine

## 2023-07-24 ENCOUNTER — Inpatient Hospital Stay: Payer: BC Managed Care – PPO | Attending: Oncology

## 2023-07-24 DIAGNOSIS — Z833 Family history of diabetes mellitus: Secondary | ICD-10-CM | POA: Diagnosis not present

## 2023-07-24 DIAGNOSIS — Z825 Family history of asthma and other chronic lower respiratory diseases: Secondary | ICD-10-CM | POA: Insufficient documentation

## 2023-07-24 DIAGNOSIS — Z9884 Bariatric surgery status: Secondary | ICD-10-CM | POA: Insufficient documentation

## 2023-07-24 DIAGNOSIS — N183 Chronic kidney disease, stage 3 unspecified: Secondary | ICD-10-CM | POA: Diagnosis not present

## 2023-07-24 DIAGNOSIS — Z8249 Family history of ischemic heart disease and other diseases of the circulatory system: Secondary | ICD-10-CM | POA: Insufficient documentation

## 2023-07-24 DIAGNOSIS — Z8611 Personal history of tuberculosis: Secondary | ICD-10-CM | POA: Diagnosis not present

## 2023-07-24 DIAGNOSIS — Z86711 Personal history of pulmonary embolism: Secondary | ICD-10-CM | POA: Diagnosis present

## 2023-07-24 DIAGNOSIS — D500 Iron deficiency anemia secondary to blood loss (chronic): Secondary | ICD-10-CM

## 2023-07-24 DIAGNOSIS — E538 Deficiency of other specified B group vitamins: Secondary | ICD-10-CM | POA: Insufficient documentation

## 2023-07-24 DIAGNOSIS — D508 Other iron deficiency anemias: Secondary | ICD-10-CM | POA: Diagnosis not present

## 2023-07-24 MED ORDER — CYANOCOBALAMIN 1000 MCG/ML IJ SOLN
1000.0000 ug | Freq: Once | INTRAMUSCULAR | Status: AC
  Administered 2023-07-24: 1000 ug via INTRAMUSCULAR
  Filled 2023-07-24: qty 1

## 2023-08-23 ENCOUNTER — Inpatient Hospital Stay: Payer: BC Managed Care – PPO | Attending: Oncology

## 2023-08-24 ENCOUNTER — Ambulatory Visit: Admitting: Internal Medicine

## 2023-08-31 ENCOUNTER — Ambulatory Visit: Admitting: Orthopaedic Surgery

## 2023-08-31 VITALS — Ht 64.0 in | Wt 270.0 lb

## 2023-08-31 DIAGNOSIS — M170 Bilateral primary osteoarthritis of knee: Secondary | ICD-10-CM

## 2023-08-31 DIAGNOSIS — G8929 Other chronic pain: Secondary | ICD-10-CM | POA: Diagnosis not present

## 2023-08-31 DIAGNOSIS — M25562 Pain in left knee: Secondary | ICD-10-CM | POA: Diagnosis not present

## 2023-08-31 DIAGNOSIS — M1711 Unilateral primary osteoarthritis, right knee: Secondary | ICD-10-CM

## 2023-08-31 DIAGNOSIS — M1712 Unilateral primary osteoarthritis, left knee: Secondary | ICD-10-CM

## 2023-08-31 DIAGNOSIS — M25561 Pain in right knee: Secondary | ICD-10-CM | POA: Diagnosis not present

## 2023-08-31 MED ORDER — METHYLPREDNISOLONE ACETATE 40 MG/ML IJ SUSP
40.0000 mg | INTRAMUSCULAR | Status: AC | PRN
Start: 2023-08-31 — End: 2023-08-31
  Administered 2023-08-31: 40 mg via INTRA_ARTICULAR

## 2023-08-31 MED ORDER — LIDOCAINE HCL 1 % IJ SOLN
3.0000 mL | INTRAMUSCULAR | Status: AC | PRN
  Administered 2023-08-31: 3 mL

## 2023-08-31 MED ORDER — LIDOCAINE HCL 1 % IJ SOLN
3.0000 mL | INTRAMUSCULAR | Status: AC | PRN
Start: 2023-08-31 — End: 2023-08-31
  Administered 2023-08-31: 3 mL

## 2023-08-31 NOTE — Progress Notes (Signed)
 [name] is a 58 year old female well-known to me.  We have replaced her right hip in the past.  She has known well-documented arthritis in both her knees.  She comes in today requesting steroid injection of both knees.  Is been a very long time since she has had any type of injections in her knees.  She is diabetic but her hemoglobin A1c is 6.3 and she does watch this closely.  She has had no acute change in her medical status.  We note that she does have significant arthritis in both her knees.  Today her BMI is 46.35 and we talked about her needing to lose some weight before she could qualify for knee replacement surgery.  However, she is really only interested in injections right now to help maintain what she has been doing for her knees.  She is working on activity modification and strengthening the knees.  On exam both knees are painful throughout the arc of motion with some patellofemoral crepitation and mainly medial joint line tenderness.  Per her request I did place a steroid injection in both knees.  She knows to watch her blood glucose closely.  She knows to wait least 3 to 4 months between these types of injections.  If she does end up having any other issues she knows to reach out to us  and let us  know.    Procedure Note  Patient: Kristen [name]             Date of Birth: [birth year]           MRN: [id number]             Visit Date: 08/31/2023  Procedures: Visit Diagnoses:  1. Chronic pain of right knee   2. Chronic pain of left knee   3. Unilateral primary osteoarthritis, left knee   4. Unilateral primary osteoarthritis, right knee     Large Joint Inj: R knee on 08/31/2023 2:24 PM Indications: diagnostic evaluation and pain Details: 22 G 1.5 in needle, superolateral approach  Arthrogram: No  Medications: 3 mL lidocaine  1 %; 40 mg methylPREDNISolone  acetate 40 MG/ML Outcome: tolerated well, no immediate complications Procedure, treatment alternatives, risks and benefits  explained, specific risks discussed. Consent was given by the patient. Immediately prior to procedure a time out was called to verify the correct patient, procedure, equipment, support staff and site/side marked as required. Patient was prepped and draped in the usual sterile fashion.    Large Joint Inj: L knee on 08/31/2023 2:25 PM Indications: diagnostic evaluation and pain Details: 22 G 1.5 in needle, superolateral approach  Arthrogram: No  Medications: 3 mL lidocaine  1 %; 40 mg methylPREDNISolone  acetate 40 MG/ML Outcome: tolerated well, no immediate complications Procedure, treatment alternatives, risks and benefits explained, specific risks discussed. Consent was given by the patient. Immediately prior to procedure a time out was called to verify the correct patient, procedure, equipment, support staff and site/side marked as required. Patient was prepped and draped in the usual sterile fashion.

## 2023-09-19 ENCOUNTER — Inpatient Hospital Stay: Payer: BC Managed Care – PPO | Attending: Oncology

## 2023-09-19 DIAGNOSIS — I252 Old myocardial infarction: Secondary | ICD-10-CM | POA: Insufficient documentation

## 2023-09-19 DIAGNOSIS — Z833 Family history of diabetes mellitus: Secondary | ICD-10-CM | POA: Diagnosis not present

## 2023-09-19 DIAGNOSIS — Z7901 Long term (current) use of anticoagulants: Secondary | ICD-10-CM | POA: Diagnosis not present

## 2023-09-19 DIAGNOSIS — D509 Iron deficiency anemia, unspecified: Secondary | ICD-10-CM | POA: Insufficient documentation

## 2023-09-19 DIAGNOSIS — L659 Nonscarring hair loss, unspecified: Secondary | ICD-10-CM | POA: Insufficient documentation

## 2023-09-19 DIAGNOSIS — Z86711 Personal history of pulmonary embolism: Secondary | ICD-10-CM | POA: Insufficient documentation

## 2023-09-19 DIAGNOSIS — Z823 Family history of stroke: Secondary | ICD-10-CM | POA: Insufficient documentation

## 2023-09-19 DIAGNOSIS — E1122 Type 2 diabetes mellitus with diabetic chronic kidney disease: Secondary | ICD-10-CM | POA: Diagnosis not present

## 2023-09-19 DIAGNOSIS — N183 Chronic kidney disease, stage 3 unspecified: Secondary | ICD-10-CM | POA: Diagnosis not present

## 2023-09-19 DIAGNOSIS — Z825 Family history of asthma and other chronic lower respiratory diseases: Secondary | ICD-10-CM | POA: Diagnosis not present

## 2023-09-19 DIAGNOSIS — I129 Hypertensive chronic kidney disease with stage 1 through stage 4 chronic kidney disease, or unspecified chronic kidney disease: Secondary | ICD-10-CM | POA: Insufficient documentation

## 2023-09-19 DIAGNOSIS — Z9884 Bariatric surgery status: Secondary | ICD-10-CM | POA: Insufficient documentation

## 2023-09-19 DIAGNOSIS — Z880 Allergy status to penicillin: Secondary | ICD-10-CM | POA: Diagnosis not present

## 2023-09-19 DIAGNOSIS — Z79899 Other long term (current) drug therapy: Secondary | ICD-10-CM | POA: Insufficient documentation

## 2023-09-19 DIAGNOSIS — Z8249 Family history of ischemic heart disease and other diseases of the circulatory system: Secondary | ICD-10-CM | POA: Diagnosis not present

## 2023-09-19 DIAGNOSIS — E538 Deficiency of other specified B group vitamins: Secondary | ICD-10-CM | POA: Diagnosis not present

## 2023-09-19 LAB — CMP (CANCER CENTER ONLY)
ALT: 13 U/L (ref 0–44)
AST: 22 U/L (ref 15–41)
Albumin: 4 g/dL (ref 3.5–5.0)
Alkaline Phosphatase: 59 U/L (ref 38–126)
Anion gap: 7 (ref 5–15)
BUN: 30 mg/dL — ABNORMAL HIGH (ref 6–20)
CO2: 23 mmol/L (ref 22–32)
Calcium: 9.4 mg/dL (ref 8.9–10.3)
Chloride: 108 mmol/L (ref 98–111)
Creatinine: 1.46 mg/dL — ABNORMAL HIGH (ref 0.44–1.00)
GFR, Estimated: 41 mL/min — ABNORMAL LOW (ref >60)
Glucose, Bld: 111 mg/dL — ABNORMAL HIGH (ref 70–99)
Potassium: 4.8 mmol/L (ref 3.5–5.1)
Sodium: 138 mmol/L (ref 135–145)
Total Bilirubin: 1 mg/dL (ref 0.0–1.2)
Total Protein: 8 g/dL (ref 6.5–8.1)

## 2023-09-19 LAB — CBC WITH DIFFERENTIAL (CANCER CENTER ONLY)
Abs Immature Granulocytes: 0.01 K/uL (ref 0.00–0.07)
Basophils Absolute: 0 K/uL (ref 0.0–0.1)
Basophils Relative: 0 %
Eosinophils Absolute: 0.1 K/uL (ref 0.0–0.5)
Eosinophils Relative: 1 %
HCT: 36.2 % (ref 36.0–46.0)
Hemoglobin: 11.3 g/dL — ABNORMAL LOW (ref 12.0–15.0)
Immature Granulocytes: 0 %
Lymphocytes Relative: 43 %
Lymphs Abs: 2 K/uL (ref 0.7–4.0)
MCH: 28.8 pg (ref 26.0–34.0)
MCHC: 31.2 g/dL (ref 30.0–36.0)
MCV: 92.3 fL (ref 80.0–100.0)
Monocytes Absolute: 0.4 K/uL (ref 0.1–1.0)
Monocytes Relative: 9 %
Neutro Abs: 2.1 K/uL (ref 1.7–7.7)
Neutrophils Relative %: 47 %
Platelet Count: 308 K/uL (ref 150–400)
RBC: 3.92 MIL/uL (ref 3.87–5.11)
RDW: 14.7 % (ref 11.5–15.5)
WBC Count: 4.6 K/uL (ref 4.0–10.5)
nRBC: 0 % (ref 0.0–0.2)

## 2023-09-19 LAB — FERRITIN: Ferritin: 140 ng/mL (ref 11–307)

## 2023-09-19 LAB — VITAMIN B12: Vitamin B-12: 382 pg/mL (ref 180–914)

## 2023-09-19 LAB — IRON AND TIBC
Iron: 86 ug/dL (ref 28–170)
Saturation Ratios: 26 % (ref 10.4–31.8)
TIBC: 337 ug/dL (ref 250–450)
UIBC: 251 ug/dL

## 2023-09-21 ENCOUNTER — Inpatient Hospital Stay (HOSPITAL_BASED_OUTPATIENT_CLINIC_OR_DEPARTMENT_OTHER): Payer: BC Managed Care – PPO | Admitting: Oncology

## 2023-09-21 ENCOUNTER — Encounter: Payer: Self-pay | Admitting: Oncology

## 2023-09-21 ENCOUNTER — Inpatient Hospital Stay: Payer: BC Managed Care – PPO

## 2023-09-21 VITALS — BP 88/62 | HR 79 | Temp 96.3°F | Resp 18 | Wt 271.8 lb

## 2023-09-21 DIAGNOSIS — Z86711 Personal history of pulmonary embolism: Secondary | ICD-10-CM

## 2023-09-21 DIAGNOSIS — E538 Deficiency of other specified B group vitamins: Secondary | ICD-10-CM | POA: Diagnosis not present

## 2023-09-21 DIAGNOSIS — D508 Other iron deficiency anemias: Secondary | ICD-10-CM

## 2023-09-21 DIAGNOSIS — D500 Iron deficiency anemia secondary to blood loss (chronic): Secondary | ICD-10-CM

## 2023-09-21 DIAGNOSIS — N1831 Chronic kidney disease, stage 3a: Secondary | ICD-10-CM | POA: Diagnosis not present

## 2023-09-21 MED ORDER — CYANOCOBALAMIN 1000 MCG/ML IJ SOLN
1000.0000 ug | Freq: Once | INTRAMUSCULAR | Status: AC
  Administered 2023-09-21: 1000 ug via INTRAMUSCULAR
  Filled 2023-09-21: qty 1

## 2023-09-21 MED ORDER — APIXABAN 2.5 MG PO TABS: 2.5000 mg | ORAL_TABLET | Freq: Two times a day (BID) | ORAL | 1 refills | Status: DC

## 2023-09-21 NOTE — Assessment & Plan Note (Signed)
Continue monthly parenteral vitamin b12 1022mg injections

## 2023-09-21 NOTE — Assessment & Plan Note (Signed)
 Encourage oral hydration and avoid nephrotoxins.

## 2023-09-21 NOTE — Progress Notes (Signed)
 Hematology/Oncology Progress note [phone] [phone]    REASON FOR VISIT  follow-up for management of pulmonary embolism, IDA  ASSESSMENT & PLAN:   History of pulmonary embolism continue Eliquis  2.5mg  BID.  Refilled Rx  CKD (chronic kidney disease) stage 3, GFR 30-59 ml/min (HCC) Encourage oral hydration and avoid nephrotoxins.    Iron  deficiency anemia History of bariatric surgery/gastric sleeve Labs are reviewed and discussed with patient. Lab Results  Component Value Date   HGB 11.3 (L) 09/19/2023   TIBC 337 09/19/2023   IRONPCTSAT 26 09/19/2023   FERRITIN 140 09/19/2023    Hemoglobin is stable.  Hold off Venofer  today.  Vitamin B12 deficiency  Continue monthly parenteral vitamin b12 1000mcg injections  Orders Placed This Encounter  Procedures   CBC with Differential (Cancer Center Only)    Standing Status:   Future    Expected Date:   03/23/2024    Expiration Date:   06/21/2024   Iron  and TIBC    Standing Status:   Future    Expected Date:   03/23/2024    Expiration Date:   06/21/2024   Ferritin    Standing Status:   Future    Expected Date:   03/23/2024    Expiration Date:   06/21/2024   Vitamin B12    Standing Status:   Future    Expected Date:   03/23/2024    Expiration Date:   06/21/2024   Retic Panel    Standing Status:   Future    Expected Date:   03/23/2024    Expiration Date:   06/21/2024   CMP (Cancer Center only)    Standing Status:   Future    Expected Date:   03/23/2024    Expiration Date:   06/21/2024   Follow up in 6 months All questions were answered. The patient knows to call the clinic with any problems, questions or concerns.  Zelphia Cap, MD, PhD [facility] Hematology Oncology 09/21/2023     PERTINENT HEMATOLOGY HISTORY Kristen [name] is a 58 y.o.afemale who has above oncology history reviewed by me today presented for follow up visit for management of pulmonary embolism.  Patient was seen by me during  hospitalization.. Patient presents to ER for evaluation of syncope episode. Patient fainted but did not fall or hurt herself as family member caught patient.  EKG in ED showed ST changed. Troponin elevated and started on heparin  drip and admitted.  On day 1 of admission, CODE BLUE was called and patient was unresponsive and received brief chest compression. Patient woke up and talked.  No cardiac arrest.  She reports feeling dizzy and lightheaded and passed out.  Similar to the syncope episode prior to admission. Cardiac catheterization showed normal LV systolic function and hyperdynamic in nature with ejection fraction of 70% consistent with hyper trophic cardiomyopathy.  Significantly elevated pulmonary pressure possibly consistent with pulmonary embolism.  No cardiac intervention due to normal coronary arteries. Patient had VQ scan on 02/15/2018 which showed hypermobility for pulmonary embolism Patient was on heparin  drip and was transitioned to Eliquis  10 mg twice daily started on 02/16/2018.  # PE finished 6 months of Eliquis  5mg  BID.  # History of gastric sleeve  INTERVAL HISTORY Kristen [name] is a 58 y.o. y.o.female who has above oncology history reviewed by me today presented for follow up visit for management of pulmonary embolism, iron  deficiency, vitamin B12 deficiency.  Patient reports feeling well.  She takes Eliquis  2.5 mg twice  daily. Denies bleeding events.  Patient is on monthly B12 injections,.   . Review of Systems  Constitutional:  Negative for appetite change, chills, fatigue and fever.  HENT:   Negative for hearing loss and voice change.   Eyes:  Negative for eye problems.  Respiratory:  Negative for chest tightness, cough and shortness of breath.   Cardiovascular:  Negative for chest pain.  Gastrointestinal:  Negative for abdominal distention, abdominal pain and blood in stool.  Endocrine: Negative for hot flashes.  Genitourinary:  Negative for difficulty urinating  and frequency.   Musculoskeletal:  Negative for arthralgias.  Skin:  Negative for itching and rash.  Neurological:  Negative for extremity weakness.  Hematological:  Negative for adenopathy.  Psychiatric/Behavioral:  Negative for confusion.       Allergies  Allergen Reactions   Amoxicillin Hives    Has patient had a PCN reaction causing immediate rash, facial/tongue/throat swelling, SOB or lightheadedness with hypotension: No Has patient had a PCN reaction causing severe rash involving mucus membranes or skin necrosis: Yes Has patient had a PCN reaction that required hospitalization No Has patient had a PCN reaction occurring within the last 10 years: No If all of the above answers are NO, then may proceed with Cephalosporin use.    Fish Allergy Hives     Past Medical History:  Diagnosis Date   Alopecia    Arthritis    B12 deficiency    Diabetes (HCC)    Type 2   Hypertension    Iron  deficiency anemia 03/07/2018   Non-STEMI (non-ST elevated myocardial infarction) (HCC) 2020   Pulmonary embolism (HCC)    URI (upper respiratory infection)    took antibiotic and prednisone oct 2019 resolved   Uses contact lenses      Past Surgical History:  Procedure Laterality Date   BREAST BIOPSY Right 2015   COLONOSCOPY WITH PROPOFOL  N/A 03/18/2016   Procedure: COLONOSCOPY WITH PROPOFOL ;  Surgeon: Belvie Just, MD;  Location: [facility];  Service: Endoscopy;  Laterality: N/A;   LAPAROSCOPIC GASTRIC SLEEVE RESECTION N/A 01/29/2018   Procedure: LAPAROSCOPIC GASTRIC SLEEVE RESECTION WITH UPPER ENDO AND ERAS PATHWAY;  Surgeon: Kinsinger, Herlene Righter, MD;  Location: [facility];  Service: General;  Laterality: N/A;   RIGHT/LEFT HEART CATH AND CORONARY ANGIOGRAPHY N/A 02/15/2018   Procedure: RIGHT/LEFT HEART CATH AND CORONARY ANGIOGRAPHY;  Surgeon: Hester Wolm PARAS, MD;  Location: [facility];  Service: Cardiovascular;  Laterality: N/A;   TOTAL HIP ARTHROPLASTY Right 08/04/2017    Procedure: RIGHT TOTAL HIP ARTHROPLASTY ANTERIOR APPROACH;  Surgeon: Vernetta Lonni GRADE, MD;  Location: [facility];  Service: Orthopedics;  Laterality: Right;    Social History   Socioeconomic History   Marital status: Married    Spouse name: Not on file   Number of children: Not on file   Years of education: Not on file   Highest education level: Master's degree (e.g., MA, MS, MEng, MEd, MSW, MBA)  Occupational History   Not on file  Tobacco Use   Smoking status: Never   Smokeless tobacco: Never  Vaping Use   Vaping status: Never Used  Substance and Sexual Activity   Alcohol use: Not Currently    Comment: OCCASIONAL    Drug use: No   Sexual activity: Yes    Birth control/protection: None  Other Topics Concern   Not on file  Social History Narrative   Not on file   Social Drivers of Health   Financial Resource Strain: Low Risk  (  04/24/2023)   Overall Financial Resource Strain (CARDIA)    Difficulty of Paying Living Expenses: Not hard at all  Food Insecurity: No Food Insecurity (04/24/2023)   Hunger Vital Sign    Worried About Running Out of Food in the Last Year: Never true    Ran Out of Food in the Last Year: Never true  Transportation Needs: No Transportation Needs (04/24/2023)   PRAPARE - Administrator, Civil Service (Medical): No    Lack of Transportation (Non-Medical): No  Physical Activity: Insufficiently Active (04/24/2023)   Exercise Vital Sign    Days of Exercise per Week: 1 day    Minutes of Exercise per Session: 10 min  Stress: No Stress Concern Present (04/24/2023)   [employer] of Occupational Health - Occupational Stress Questionnaire    Feeling of Stress : Not at all  Social Connections: Socially Integrated (04/24/2023)   Social Connection and Isolation Panel    Frequency of Communication with Friends and Family: More than three times a week    Frequency of Social Gatherings with Friends and Family: More than three times a week     Attends Religious Services: More than 4 times per year    Active Member of [employer] or Organizations: Yes    Attends Engineer, structural: More than 4 times per year    Marital Status: Married  Catering manager Violence: Not on file    Family History  Problem Relation Age of Onset   Hypertension Mother    Diabetes Mother    Hypertension Father    Diabetes Father    COPD Father    Stroke Father    Congestive Heart Failure Father    Diabetes Sister    Hypertension Sister      Current Outpatient Medications:    acetaminophen  (TYLENOL ) 500 MG tablet, Take 1 tablet (500 mg total) by mouth every 6 (six) hours as needed., Disp: 30 tablet, Rfl: 0   apixaban  (ELIQUIS ) 2.5 MG TABS tablet, Take 1 tablet (2.5 mg total) by mouth 2 (two) times daily., Disp: 180 tablet, Rfl: 1   atorvastatin  (LIPITOR) 20 MG tablet, TAKE 1 TABLET BY MOUTH EVERY DAY, Disp: 90 tablet, Rfl: 3   cetirizine (ZYRTEC) 10 MG tablet, Take 10 mg by mouth 2 (two) times daily. Pt takes twice a day, Disp: , Rfl:    cyanocobalamin  (,VITAMIN B-12,) 1000 MCG/ML injection, Inject into the muscle., Disp: , Rfl:    dapagliflozin  propanediol (FARXIGA ) 10 MG TABS tablet, Take 1 tablet (10 mg total) by mouth daily., Disp: 90 tablet, Rfl: 2   docusate sodium  (COLACE) 100 MG capsule, Take 100 mg by mouth 2 (two) times daily., Disp: , Rfl:    FLUoxetine  (PROZAC ) 20 MG capsule, TAKE ONE CAPSULE BY MOUTH EVERY DAY, Disp: 90 capsule, Rfl: 2   hydrochlorothiazide  (HYDRODIURIL ) 25 MG tablet, Take 1 tablet (25 mg total) by mouth daily., Disp: 90 tablet, Rfl: 2   metFORMIN  (GLUCOPHAGE -XR) 500 MG 24 hr tablet, Take 1 tablet (500 mg total) by mouth daily with breakfast., Disp: 90 tablet, Rfl: 1   olmesartan  (BENICAR ) 40 MG tablet, Take 1 tablet (40 mg total) by mouth daily., Disp: 90 tablet, Rfl: 2   Semaglutide  (RYBELSUS ) 14 MG TABS, Take 1 tablet (14 mg total) by mouth daily., Disp: 180 tablet, Rfl: 1   Vitamin D , Ergocalciferol , (DRISDOL )  1.25 MG (50000 UNIT) CAPS capsule, TAKE 1 CAPSULE BY MOUTH TWICE A WEEK ON TUES/FRI AS DIRECTED, Disp: 24 capsule, Rfl:  1  Physical exam:  Vitals:   09/21/23 1413 09/21/23 1425  BP: (!) 88/63 (!) 88/62  Pulse: 79   Resp: 18   Temp: (!) 96.3 F (35.7 C)   TempSrc: Tympanic   SpO2: 100%   Weight: 271 lb 12.8 oz (123.3 kg)    Physical Exam Constitutional:      General: She is not in acute distress.    Comments: Morbidly obese.   HENT:     Head: Normocephalic and atraumatic.     Comments: Chronic alopecia Eyes:     General: No scleral icterus.    Pupils: Pupils are equal, round, and reactive to light.  Cardiovascular:     Rate and Rhythm: Normal rate.  Pulmonary:     Effort: Pulmonary effort is normal. No respiratory distress.     Breath sounds: No wheezing.  Abdominal:     General: There is no distension.     Palpations: Abdomen is soft.  Musculoskeletal:        General: No deformity. Normal range of motion.     Cervical back: Normal range of motion and neck supple.  Skin:    General: Skin is warm and dry.     Findings: No erythema or rash.  Neurological:     Mental Status: She is alert and oriented to person, place, and time. Mental status is at baseline.     Cranial Nerves: No cranial nerve deficit.  Psychiatric:        Mood and Affect: Mood normal.    LABORATORY STUDIES    Latest Ref Rng & Units 09/19/2023    2:19 PM 03/21/2023    2:01 PM 09/16/2022   11:27 AM  CBC  WBC 4.0 - 10.5 K/uL 4.6  4.7  4.9   Hemoglobin 12.0 - 15.0 g/dL 88.6  88.3  88.6   Hematocrit 36.0 - 46.0 % 36.2  36.4  35.2   Platelets 150 - 400 K/uL 308  352  347       Latest Ref Rng & Units 09/19/2023    2:19 PM 03/21/2023    2:01 PM 01/10/2023    4:25 PM  CMP  Glucose 70 - 99 mg/dL 888  896  91   BUN 6 - 20 mg/dL 30  29  23    Creatinine 0.44 - 1.00 mg/dL 8.53  8.60  8.63   Sodium 135 - 145 mmol/L 138  137  142   Potassium 3.5 - 5.1 mmol/L 4.8  4.3  4.4   Chloride 98 - 111 mmol/L 108  104   105   CO2 22 - 32 mmol/L 23  23  24    Calcium  8.9 - 10.3 mg/dL 9.4  9.2  9.2   Total Protein 6.5 - 8.1 g/dL 8.0  8.2    Total Bilirubin 0.0 - 1.2 mg/dL 1.0  0.9    Alkaline Phos 38 - 126 U/L 59  55    AST 15 - 41 U/L 22  27    ALT 0 - 44 U/L 13  14     Iron /TIBC/Ferritin/ %Sat    Component Value Date/Time   IRON  86 09/19/2023 1419   TIBC 337 09/19/2023 1419   FERRITIN 140 09/19/2023 1419   IRONPCTSAT 26 09/19/2023 1419      RADIOGRAPHIC STUDIES: I have personally reviewed the radiological images as listed and agreed with the findings in the report. No results found.

## 2023-09-21 NOTE — Assessment & Plan Note (Signed)
continue Eliquis 2.'5mg'$  BID.  Refilled Rx

## 2023-09-21 NOTE — Assessment & Plan Note (Addendum)
 History of bariatric surgery/gastric sleeve Labs are reviewed and discussed with patient. Lab Results  Component Value Date   HGB 11.3 (L) 09/19/2023   TIBC 337 09/19/2023   IRONPCTSAT 26 09/19/2023   FERRITIN 140 09/19/2023    Hemoglobin is stable.  Hold off Venofer  today.

## 2023-09-24 ENCOUNTER — Other Ambulatory Visit: Payer: Self-pay | Admitting: Internal Medicine

## 2023-10-24 ENCOUNTER — Inpatient Hospital Stay: Attending: Oncology

## 2023-10-24 ENCOUNTER — Other Ambulatory Visit: Payer: Self-pay | Admitting: Internal Medicine

## 2023-10-24 DIAGNOSIS — Z79899 Other long term (current) drug therapy: Secondary | ICD-10-CM | POA: Insufficient documentation

## 2023-10-24 DIAGNOSIS — Z86711 Personal history of pulmonary embolism: Secondary | ICD-10-CM | POA: Insufficient documentation

## 2023-10-24 DIAGNOSIS — D500 Iron deficiency anemia secondary to blood loss (chronic): Secondary | ICD-10-CM

## 2023-10-24 MED ORDER — CYANOCOBALAMIN 1000 MCG/ML IJ SOLN
1000.0000 ug | Freq: Once | INTRAMUSCULAR | Status: AC
  Administered 2023-10-24: 1000 ug via INTRAMUSCULAR
  Filled 2023-10-24: qty 1

## 2023-11-23 ENCOUNTER — Inpatient Hospital Stay: Attending: Oncology

## 2023-12-18 ENCOUNTER — Encounter: Payer: Self-pay | Admitting: Radiology

## 2023-12-25 ENCOUNTER — Inpatient Hospital Stay: Attending: Oncology

## 2024-01-19 ENCOUNTER — Other Ambulatory Visit: Payer: Self-pay | Admitting: Internal Medicine

## 2024-01-20 ENCOUNTER — Other Ambulatory Visit: Payer: Self-pay | Admitting: Internal Medicine

## 2024-01-22 ENCOUNTER — Other Ambulatory Visit: Payer: Self-pay | Admitting: Internal Medicine

## 2024-01-22 DIAGNOSIS — Z1231 Encounter for screening mammogram for malignant neoplasm of breast: Secondary | ICD-10-CM

## 2024-01-23 ENCOUNTER — Inpatient Hospital Stay: Admission: RE | Admit: 2024-01-23 | Discharge: 2024-01-23 | Attending: Internal Medicine | Admitting: Internal Medicine

## 2024-01-23 DIAGNOSIS — Z1231 Encounter for screening mammogram for malignant neoplasm of breast: Secondary | ICD-10-CM

## 2024-01-24 ENCOUNTER — Inpatient Hospital Stay: Attending: Oncology

## 2024-01-24 DIAGNOSIS — Z86711 Personal history of pulmonary embolism: Secondary | ICD-10-CM | POA: Diagnosis present

## 2024-01-24 DIAGNOSIS — Z79899 Other long term (current) drug therapy: Secondary | ICD-10-CM | POA: Diagnosis not present

## 2024-01-24 DIAGNOSIS — D509 Iron deficiency anemia, unspecified: Secondary | ICD-10-CM | POA: Diagnosis not present

## 2024-01-24 DIAGNOSIS — D500 Iron deficiency anemia secondary to blood loss (chronic): Secondary | ICD-10-CM

## 2024-01-24 MED ORDER — CYANOCOBALAMIN 1000 MCG/ML IJ SOLN
1000.0000 ug | Freq: Once | INTRAMUSCULAR | Status: AC
  Administered 2024-01-24: 1000 ug via INTRAMUSCULAR

## 2024-01-27 ENCOUNTER — Other Ambulatory Visit: Payer: Self-pay | Admitting: Internal Medicine

## 2024-01-30 ENCOUNTER — Encounter: Payer: Self-pay | Admitting: Internal Medicine

## 2024-02-01 ENCOUNTER — Encounter: Payer: Self-pay | Admitting: Internal Medicine

## 2024-02-01 ENCOUNTER — Telehealth: Admitting: Internal Medicine

## 2024-02-01 VITALS — Ht 64.0 in | Wt 267.0 lb

## 2024-02-01 DIAGNOSIS — N1832 Chronic kidney disease, stage 3b: Secondary | ICD-10-CM

## 2024-02-01 DIAGNOSIS — Z7984 Long term (current) use of oral hypoglycemic drugs: Secondary | ICD-10-CM | POA: Diagnosis not present

## 2024-02-01 DIAGNOSIS — Z5181 Encounter for therapeutic drug level monitoring: Secondary | ICD-10-CM | POA: Diagnosis not present

## 2024-02-01 DIAGNOSIS — Z6841 Body Mass Index (BMI) 40.0 and over, adult: Secondary | ICD-10-CM | POA: Diagnosis not present

## 2024-02-01 DIAGNOSIS — E1169 Type 2 diabetes mellitus with other specified complication: Secondary | ICD-10-CM | POA: Diagnosis not present

## 2024-02-01 DIAGNOSIS — E785 Hyperlipidemia, unspecified: Secondary | ICD-10-CM

## 2024-02-01 DIAGNOSIS — Z79899 Other long term (current) drug therapy: Secondary | ICD-10-CM | POA: Diagnosis not present

## 2024-02-01 DIAGNOSIS — E1122 Type 2 diabetes mellitus with diabetic chronic kidney disease: Secondary | ICD-10-CM

## 2024-02-01 DIAGNOSIS — I129 Hypertensive chronic kidney disease with stage 1 through stage 4 chronic kidney disease, or unspecified chronic kidney disease: Secondary | ICD-10-CM

## 2024-02-01 MED ORDER — ATORVASTATIN CALCIUM 20 MG PO TABS: 20.0000 mg | ORAL_TABLET | Freq: Every day | ORAL | 3 refills | Status: AC

## 2024-02-01 MED ORDER — OLMESARTAN MEDOXOMIL 40 MG PO TABS: 40.0000 mg | ORAL_TABLET | Freq: Every day | ORAL | 2 refills | Status: AC

## 2024-02-01 MED ORDER — METFORMIN HCL ER 500 MG PO TB24: 500.0000 mg | ORAL_TABLET | Freq: Every day | ORAL | 1 refills | Status: AC

## 2024-02-01 MED ORDER — RYBELSUS 14 MG PO TABS: 1.0000 | ORAL_TABLET | Freq: Every day | ORAL | 3 refills | Status: AC

## 2024-02-01 NOTE — Progress Notes (Unsigned)
 Virtual Visit via Video Note  I,Kristen [name], CMA,acting as a scribe for Kristen LOISE Slocumb, MD.,have documented all relevant documentation on the behalf of Kristen LOISE Slocumb, MD,as directed by  Kristen LOISE Slocumb, MD while in the presence of Kristen LOISE Slocumb, MD.  I connected with Kristen [name] on 02/01/2024 at  3:00 PM EST by a video enabled telemedicine application and verified that I am speaking with the correct person using two identifiers.  Patient Location: Home Provider Location: Office/Clinic  I discussed the limitations, risks, security, and privacy concerns of performing an evaluation and management service by video and the availability of in person appointments. I also discussed with the patient that there may be a patient responsible charge related to this service. The patient expressed understanding and agreed to proceed.  Subjective: PCP: [name] Catheryn, MD  Chief Complaint  Patient presents with   Diabetes    Patient presents today for a diabetes check. Patient reports compliance with her meds. Patient denies having any chest pain,sob or headaches at this time. Patient doesn't have any questions or concerns at this time.   Hypertension   Diabetes She presents for her follow-up diabetic visit. She has type 2 diabetes mellitus. There are no hypoglycemic associated symptoms. Pertinent negatives for hypoglycemia include no dizziness or headaches. Pertinent negatives for diabetes include no blurred vision and no chest pain. There are no hypoglycemic complications. Symptoms are stable. There are no diabetic complications. Risk factors for coronary artery disease include obesity and sedentary lifestyle. She is compliant with treatment all of the time.  Hypertension This is a chronic problem. The current episode started more than 1 year ago. The problem has been gradually improving since onset. The problem is controlled. Pertinent negatives include no blurred vision, chest  pain, headaches, orthopnea, palpitations or shortness of breath. Risk factors for coronary artery disease include diabetes mellitus, dyslipidemia, obesity and sedentary lifestyle. Past treatments include diuretics and angiotensin blockers.   Discussed the use of AI scribe software for clinical note transcription with the patient, who gave verbal consent to proceed.  History of Present Illness Kristen [name] is a 58 year old female with diabetes and hypertension who presents for a follow-up visit.  She has not been consistently checking her blood glucose levels despite having a glucometer. Her current diabetes medications include metformin  400 mg once daily and Rybelsus  14 mg once daily. No significant changes have been made to her diabetes management recently.  She has been told she is in stage 3B chronic kidney disease. She reviewed her lab results in MyChart and was not told by her nephrologist that she is spilling a lot of protein. Her current medications include Eliquis  2.5 mg twice daily, atorvastatin  20 mg once daily, Farxiga  10 mg once daily, fluoxetine  20 mg once daily, hydrochlorothiazide  25 mg once daily, metformin  400 mg once daily, olmesartan  40 mg once daily, and Rybelsus  14 mg once daily. She is concerned about the cost of Eliquis  due to insurance deductible resets in January.  She had an eye exam earlier this year at [facility] in [place] and received new glasses. A recent mammogram returned normal results. She has not had a Pap smear this year but follows a biennial schedule, with the last one in 2021 with Dr. Ubaldo at [facility].  Socially, she is adjusting to living alone with her Kristen [name] following a separation from her husband. She feels at peace with these changes.    ROS: Per  HPI Current Medications[1]  Observations/Objective: Today's Vitals   02/01/24 0956  Weight: 267 lb (121.1 kg)  Height: 5' 4 (1.626 m)   Physical Exam Vitals and  nursing note reviewed.  Constitutional:      Appearance: Normal appearance.  HENT:     Head: Normocephalic and atraumatic.  Eyes:     Extraocular Movements: Extraocular movements intact.  Pulmonary:     Effort: Pulmonary effort is normal.  Musculoskeletal:     Cervical back: Normal range of motion.  Neurological:     Mental Status: She is alert.     Assessment and Plan: Dyslipidemia associated with type 2 diabetes mellitus (HCC) -     CMP14+EGFR; Future -     Hemoglobin A1c; Future  Parenchymal renal hypertension, stage 1 through stage 4 or unspecified chronic kidney disease  Type 2 diabetes mellitus with stage 3b chronic kidney disease, without long-term current use of insulin  (HCC)  Other long term (current) drug therapy  Morbid obesity due to excess calories (HCC)  Other orders -     Rybelsus ; Take 1 tablet (14 mg total) by mouth daily.  Dispense: 90 tablet; Refill: 3 -     metFORMIN  HCl ER; Take 1 tablet (500 mg total) by mouth daily with breakfast.  Dispense: 90 tablet; Refill: 1 -     Olmesartan  Medoxomil; Take 1 tablet (40 mg total) by mouth daily.  Dispense: 90 tablet; Refill: 2 -     Atorvastatin  Calcium ; Take 1 tablet (20 mg total) by mouth daily.  Dispense: 90 tablet; Refill: 3   Assessment & Plan Type 2 diabetes mellitus with stage 3b chronic kidney disease Inconsistent blood glucose monitoring. Hydration issues may affect kidney function. No significant proteinuria, indicating well-managed kidney function. - Encouraged consistent blood glucose monitoring using glucometer. - Advised on improving hydration to support kidney function. - Ordered A1c test. - Refilled Rybelsus  prescription. - Refilled olmesartan  prescription with refills. - Refilled metformin  prescription.  General Health Maintenance Recent mammogram results were normal. Eye exam completed at [facility] in [place]. Pap smear status unclear, requires follow-up with OB GYN. - Scheduled lab  and nurse visit for flu shot. - Requested ophthalmology exam records from [facility] in [place]. - Requested Pap smear records from [facility].  Follow Up Instructions: Return for keep lab appt for next week, 4 month f/u bp/dm check.   I discussed the assessment and treatment plan with the patient. The patient was provided an opportunity to ask questions, and all were answered. The patient agreed with the plan and demonstrated an understanding of the instructions.   The patient was advised to call back or seek an in-person evaluation if the symptoms worsen or if the condition fails to improve as anticipated.  The above assessment and management plan was discussed with the patient. The patient verbalized understanding of and has agreed to the management plan.   I, Kristen LOISE Slocumb, MD, have reviewed all documentation for this visit. The documentation on 02/01/2024 for the exam, diagnosis, procedures, and orders are all accurate and complete.       [1]  Current Outpatient Medications:    acetaminophen  (TYLENOL ) 500 MG tablet, Take 1 tablet (500 mg total) by mouth every 6 (six) hours as needed., Disp: 30 tablet, Rfl: 0   apixaban  (ELIQUIS ) 2.5 MG TABS tablet, Take 1 tablet (2.5 mg total) by mouth 2 (two) times daily., Disp: 180 tablet, Rfl: 1   cetirizine (ZYRTEC) 10 MG tablet, Take 10 mg by mouth  2 (two) times daily. Pt takes twice a day, Disp: , Rfl:    cyanocobalamin  (,VITAMIN B-12,) 1000 MCG/ML injection, Inject into the muscle., Disp: , Rfl:    FARXIGA  10 MG TABS tablet, TAKE 1 TABLET(10 MG) BY MOUTH DAILY, Disp: 90 tablet, Rfl: 2   FLUoxetine  (PROZAC ) 20 MG capsule, TAKE ONE CAPSULE BY MOUTH EVERY DAY, Disp: 90 capsule, Rfl: 2   hydrochlorothiazide  (HYDRODIURIL ) 25 MG tablet, TAKE 1 TABLET(25 MG) BY MOUTH DAILY, Disp: 90 tablet, Rfl: 2   Vitamin D , Ergocalciferol , (DRISDOL ) 1.25 MG (50000 UNIT) CAPS capsule, TAKE ONE CAPSULE BY MOUTH TWICE A WEEK ON TUES/FRI AS DIRECTED,  Disp: 24 capsule, Rfl: 1   atorvastatin  (LIPITOR) 20 MG tablet, Take 1 tablet (20 mg total) by mouth daily., Disp: 90 tablet, Rfl: 3   metFORMIN  (GLUCOPHAGE -XR) 500 MG 24 hr tablet, Take 1 tablet (500 mg total) by mouth daily with breakfast., Disp: 90 tablet, Rfl: 1   olmesartan  (BENICAR ) 40 MG tablet, Take 1 tablet (40 mg total) by mouth daily., Disp: 90 tablet, Rfl: 2   Semaglutide  (RYBELSUS ) 14 MG TABS, Take 1 tablet (14 mg total) by mouth daily., Disp: 90 tablet, Rfl: 3

## 2024-02-01 NOTE — Patient Instructions (Signed)

## 2024-02-03 NOTE — Assessment & Plan Note (Signed)
 Inconsistent blood glucose monitoring. Hydration issues may affect kidney function. No significant proteinuria, indicating well-managed kidney function. - Encouraged consistent blood glucose monitoring using glucometer. - Advised on improving hydration to support kidney function. - Ordered A1c test. - Refilled Rybelsus  prescription. - Refilled metformin  prescription. - Continue follow up with Nephrology.

## 2024-02-03 NOTE — Assessment & Plan Note (Signed)
 Chronic. Goal BP<120/80.  She will continue olmesartan  40mg  and hydrochlorothiazide  25mg  daily.  She is encouraged to follow low sodium diet. She will f/u in four months for re-evaluation.

## 2024-02-03 NOTE — Assessment & Plan Note (Signed)
 BMI 45.  She is encouraged to strive to lose ten percent of her body weight to decrease cardiac risk.

## 2024-02-06 ENCOUNTER — Ambulatory Visit: Payer: Self-pay | Admitting: Internal Medicine

## 2024-02-06 ENCOUNTER — Ambulatory Visit

## 2024-02-06 ENCOUNTER — Other Ambulatory Visit: Payer: Self-pay

## 2024-02-06 VITALS — BP 124/82 | HR 77 | Temp 98.3°F | Ht 64.0 in | Wt 270.0 lb

## 2024-02-06 DIAGNOSIS — E1169 Type 2 diabetes mellitus with other specified complication: Secondary | ICD-10-CM

## 2024-02-06 DIAGNOSIS — Z23 Encounter for immunization: Secondary | ICD-10-CM | POA: Diagnosis not present

## 2024-02-06 NOTE — Progress Notes (Signed)
 Patient is in office today for a nurse visit for FLU Immunization. Patient Injection was given in the  Left deltoid. Patient tolerated injection well.

## 2024-02-06 NOTE — Patient Instructions (Signed)

## 2024-02-07 LAB — CMP14+EGFR
ALT: 9 IU/L (ref 0–32)
AST: 19 IU/L (ref 0–40)
Albumin: 3.9 g/dL (ref 3.8–4.9)
Alkaline Phosphatase: 72 IU/L (ref 49–135)
BUN/Creatinine Ratio: 18 (ref 9–23)
BUN: 29 mg/dL — ABNORMAL HIGH (ref 6–24)
Bilirubin Total: 0.5 mg/dL (ref 0.0–1.2)
CO2: 21 mmol/L (ref 20–29)
Calcium: 9.3 mg/dL (ref 8.7–10.2)
Chloride: 102 mmol/L (ref 96–106)
Creatinine, Ser: 1.57 mg/dL — ABNORMAL HIGH (ref 0.57–1.00)
Globulin, Total: 3.3 g/dL (ref 1.5–4.5)
Glucose: 125 mg/dL — ABNORMAL HIGH (ref 70–99)
Potassium: 5.1 mmol/L (ref 3.5–5.2)
Sodium: 138 mmol/L (ref 134–144)
Total Protein: 7.2 g/dL (ref 6.0–8.5)
eGFR: 38 mL/min/1.73 — ABNORMAL LOW

## 2024-02-07 LAB — HEMOGLOBIN A1C
Est. average glucose Bld gHb Est-mCnc: 137 mg/dL
Hgb A1c MFr Bld: 6.4 % — ABNORMAL HIGH (ref 4.8–5.6)

## 2024-02-09 ENCOUNTER — Ambulatory Visit: Payer: Self-pay | Admitting: Internal Medicine

## 2024-02-19 ENCOUNTER — Other Ambulatory Visit: Payer: Self-pay | Admitting: Internal Medicine

## 2024-02-26 ENCOUNTER — Inpatient Hospital Stay: Attending: Oncology

## 2024-02-26 DIAGNOSIS — Z79899 Other long term (current) drug therapy: Secondary | ICD-10-CM | POA: Diagnosis not present

## 2024-02-26 DIAGNOSIS — D509 Iron deficiency anemia, unspecified: Secondary | ICD-10-CM | POA: Insufficient documentation

## 2024-02-26 DIAGNOSIS — E538 Deficiency of other specified B group vitamins: Secondary | ICD-10-CM | POA: Insufficient documentation

## 2024-02-26 DIAGNOSIS — N1831 Chronic kidney disease, stage 3a: Secondary | ICD-10-CM | POA: Diagnosis not present

## 2024-02-26 DIAGNOSIS — Z86711 Personal history of pulmonary embolism: Secondary | ICD-10-CM | POA: Insufficient documentation

## 2024-02-26 DIAGNOSIS — D500 Iron deficiency anemia secondary to blood loss (chronic): Secondary | ICD-10-CM

## 2024-02-26 MED ORDER — CYANOCOBALAMIN 1000 MCG/ML IJ SOLN
1000.0000 ug | Freq: Once | INTRAMUSCULAR | Status: AC
  Administered 2024-02-26: 1000 ug via INTRAMUSCULAR
  Filled 2024-02-26: qty 1

## 2024-03-19 ENCOUNTER — Other Ambulatory Visit: Payer: Self-pay | Admitting: Internal Medicine

## 2024-03-21 ENCOUNTER — Other Ambulatory Visit: Payer: Self-pay | Admitting: Oncology

## 2024-03-25 ENCOUNTER — Inpatient Hospital Stay: Attending: Oncology

## 2024-03-27 ENCOUNTER — Ambulatory Visit: Admitting: Oncology

## 2024-03-27 ENCOUNTER — Ambulatory Visit

## 2024-04-25 ENCOUNTER — Encounter: Admitting: Internal Medicine

## 2024-05-02 ENCOUNTER — Encounter: Payer: Self-pay | Admitting: Internal Medicine
